# Patient Record
Sex: Female | Born: 1955 | Race: White | Hispanic: No | Marital: Married | Smoking: Never smoker
Health system: Southern US, Community
[De-identification: ages and names within clinical notes are randomized; demographics above are authoritative.]

## PROBLEM LIST (undated history)

## (undated) DIAGNOSIS — I6529 Occlusion and stenosis of unspecified carotid artery: Secondary | ICD-10-CM

## (undated) DIAGNOSIS — E039 Hypothyroidism, unspecified: Secondary | ICD-10-CM

## (undated) DIAGNOSIS — E559 Vitamin D deficiency, unspecified: Secondary | ICD-10-CM

## (undated) DIAGNOSIS — E079 Disorder of thyroid, unspecified: Secondary | ICD-10-CM

## (undated) DIAGNOSIS — D649 Anemia, unspecified: Secondary | ICD-10-CM

## (undated) DIAGNOSIS — K219 Gastro-esophageal reflux disease without esophagitis: Secondary | ICD-10-CM

## (undated) DIAGNOSIS — E230 Hypopituitarism: Secondary | ICD-10-CM

## (undated) DIAGNOSIS — K259 Gastric ulcer, unspecified as acute or chronic, without hemorrhage or perforation: Secondary | ICD-10-CM

## (undated) DIAGNOSIS — D352 Benign neoplasm of pituitary gland: Secondary | ICD-10-CM

## (undated) DIAGNOSIS — R569 Unspecified convulsions: Secondary | ICD-10-CM

## (undated) DIAGNOSIS — G40909 Epilepsy, unspecified, not intractable, without status epilepticus: Secondary | ICD-10-CM

## (undated) DIAGNOSIS — K449 Diaphragmatic hernia without obstruction or gangrene: Secondary | ICD-10-CM

## (undated) DIAGNOSIS — T7840XA Allergy, unspecified, initial encounter: Secondary | ICD-10-CM

## (undated) HISTORY — DX: Disorder of thyroid, unspecified: E07.9

## (undated) HISTORY — DX: Hypothyroidism, unspecified: E03.9

## (undated) HISTORY — DX: Hypopituitarism: E23.0

## (undated) HISTORY — DX: Diaphragmatic hernia without obstruction or gangrene: K44.9

## (undated) HISTORY — PX: OTHER SURGICAL HISTORY: SHX169

## (undated) HISTORY — DX: Epilepsy, unspecified, not intractable, without status epilepticus: G40.909

## (undated) HISTORY — DX: Unspecified convulsions: R56.9

## (undated) HISTORY — DX: Allergy, unspecified, initial encounter: T78.40XA

## (undated) HISTORY — DX: Occlusion and stenosis of unspecified carotid artery: I65.29

## (undated) HISTORY — DX: Vitamin D deficiency, unspecified: E55.9

## (undated) HISTORY — DX: Anemia, unspecified: D64.9

## (undated) HISTORY — DX: Gastric ulcer, unspecified as acute or chronic, without hemorrhage or perforation: K25.9

## (undated) HISTORY — PX: WISDOM TOOTH EXTRACTION: SHX21

## (undated) HISTORY — DX: Benign neoplasm of pituitary gland: D35.2

## (undated) HISTORY — DX: Gastro-esophageal reflux disease without esophagitis: K21.9

---

## 2007-10-07 DIAGNOSIS — E559 Vitamin D deficiency, unspecified: Secondary | ICD-10-CM | POA: Insufficient documentation

## 2008-05-19 DIAGNOSIS — E669 Obesity, unspecified: Secondary | ICD-10-CM | POA: Insufficient documentation

## 2009-01-01 DIAGNOSIS — K219 Gastro-esophageal reflux disease without esophagitis: Secondary | ICD-10-CM | POA: Insufficient documentation

## 2011-03-28 DIAGNOSIS — E230 Hypopituitarism: Secondary | ICD-10-CM | POA: Insufficient documentation

## 2011-03-28 DIAGNOSIS — E8881 Metabolic syndrome: Secondary | ICD-10-CM | POA: Insufficient documentation

## 2011-03-28 DIAGNOSIS — E786 Lipoprotein deficiency: Secondary | ICD-10-CM | POA: Insufficient documentation

## 2011-05-29 DIAGNOSIS — G40109 Localization-related (focal) (partial) symptomatic epilepsy and epileptic syndromes with simple partial seizures, not intractable, without status epilepticus: Secondary | ICD-10-CM | POA: Insufficient documentation

## 2011-07-09 DIAGNOSIS — R918 Other nonspecific abnormal finding of lung field: Secondary | ICD-10-CM | POA: Insufficient documentation

## 2011-07-09 DIAGNOSIS — R0600 Dyspnea, unspecified: Secondary | ICD-10-CM | POA: Insufficient documentation

## 2012-01-05 DIAGNOSIS — D352 Benign neoplasm of pituitary gland: Secondary | ICD-10-CM | POA: Insufficient documentation

## 2012-03-07 DIAGNOSIS — R948 Abnormal results of function studies of other organs and systems: Secondary | ICD-10-CM | POA: Insufficient documentation

## 2012-07-24 DIAGNOSIS — E063 Autoimmune thyroiditis: Secondary | ICD-10-CM | POA: Insufficient documentation

## 2012-07-24 DIAGNOSIS — E236 Other disorders of pituitary gland: Secondary | ICD-10-CM | POA: Insufficient documentation

## 2012-07-24 DIAGNOSIS — E785 Hyperlipidemia, unspecified: Secondary | ICD-10-CM | POA: Insufficient documentation

## 2012-10-15 DIAGNOSIS — R29818 Other symptoms and signs involving the nervous system: Secondary | ICD-10-CM | POA: Insufficient documentation

## 2012-12-17 DIAGNOSIS — E8881 Metabolic syndrome: Secondary | ICD-10-CM | POA: Insufficient documentation

## 2012-12-17 DIAGNOSIS — E039 Hypothyroidism, unspecified: Secondary | ICD-10-CM | POA: Insufficient documentation

## 2014-04-22 DIAGNOSIS — H60399 Other infective otitis externa, unspecified ear: Secondary | ICD-10-CM | POA: Insufficient documentation

## 2015-10-13 DIAGNOSIS — B370 Candidal stomatitis: Secondary | ICD-10-CM | POA: Insufficient documentation

## 2015-12-01 DIAGNOSIS — J342 Deviated nasal septum: Secondary | ICD-10-CM | POA: Insufficient documentation

## 2015-12-01 DIAGNOSIS — J019 Acute sinusitis, unspecified: Secondary | ICD-10-CM | POA: Insufficient documentation

## 2015-12-10 HISTORY — PX: COLONOSCOPY: SHX174

## 2016-04-27 DIAGNOSIS — E611 Iron deficiency: Secondary | ICD-10-CM | POA: Insufficient documentation

## 2016-07-31 DIAGNOSIS — D649 Anemia, unspecified: Secondary | ICD-10-CM | POA: Insufficient documentation

## 2016-07-31 DIAGNOSIS — Z1211 Encounter for screening for malignant neoplasm of colon: Secondary | ICD-10-CM | POA: Insufficient documentation

## 2016-11-22 DIAGNOSIS — Q388 Other congenital malformations of pharynx: Secondary | ICD-10-CM | POA: Insufficient documentation

## 2016-11-22 DIAGNOSIS — Z87730 Personal history of (corrected) cleft lip and palate: Secondary | ICD-10-CM | POA: Insufficient documentation

## 2017-07-31 DIAGNOSIS — D259 Leiomyoma of uterus, unspecified: Secondary | ICD-10-CM | POA: Insufficient documentation

## 2017-10-01 DIAGNOSIS — M810 Age-related osteoporosis without current pathological fracture: Secondary | ICD-10-CM | POA: Insufficient documentation

## 2017-10-01 DIAGNOSIS — R030 Elevated blood-pressure reading, without diagnosis of hypertension: Secondary | ICD-10-CM | POA: Insufficient documentation

## 2017-10-16 DIAGNOSIS — E538 Deficiency of other specified B group vitamins: Secondary | ICD-10-CM | POA: Insufficient documentation

## 2018-08-19 ENCOUNTER — Ambulatory Visit (INDEPENDENT_AMBULATORY_CARE_PROVIDER_SITE_OTHER): Payer: BLUE CROSS/BLUE SHIELD | Admitting: Family Medicine

## 2018-08-19 ENCOUNTER — Encounter: Payer: Self-pay | Admitting: Family Medicine

## 2018-08-19 VITALS — Wt 163.8 lb

## 2018-08-19 DIAGNOSIS — R102 Pelvic and perineal pain unspecified side: Secondary | ICD-10-CM

## 2018-08-19 DIAGNOSIS — N3090 Cystitis, unspecified without hematuria: Secondary | ICD-10-CM | POA: Diagnosis not present

## 2018-08-19 DIAGNOSIS — Z0000 Encounter for general adult medical examination without abnormal findings: Secondary | ICD-10-CM

## 2018-08-19 MED ORDER — NITROFURANTOIN MONOHYD MACRO 100 MG PO CAPS: 100.0000 mg | ORAL_CAPSULE | Freq: Two times a day (BID) | ORAL | 0 refills | Status: AC

## 2018-08-19 MED ORDER — FLUCONAZOLE 150 MG PO TABS: 150.0000 mg | ORAL_TABLET | Freq: Once | ORAL | 0 refills | Status: DC | PRN

## 2018-08-19 NOTE — Progress Notes (Signed)
Have had a lot of pelvic pressure and ? Bladder issues for a year or so. Urinates a lot but does not have dysuria. Lives in [place] so all providers are there. Currently taking OxybutyninCL ER 5mg  for bladder spasms but is not helping. Her OB/GYN in [place] ordered this. Denies vag d/c

## 2018-08-19 NOTE — Progress Notes (Signed)
   GYNECOLOGY OFFICE VISIT NOTE History:  62 y.o. G3P0 here today for urinary frequency, dysuria.   - placed on medication for bladder spasms prior to moving from [place] (oxybutynin)  - just got back from travelling, not drinking as much, lots of walking around  - no fevers, chills - no back pain  The following portions of the patient's history were reviewed and updated as appropriate: allergies, current medications, past family history, past medical history, past social history, past surgical history and problem list.   Review of Systems:  Pertinent items noted in HPI ROS  Objective:  Physical Exam Wt 74.3 kg  Physical Exam  Constitutional: She is oriented to person, place, and time. She appears well-developed and well-nourished. No distress.  HENT:  Head: Normocephalic and atraumatic.  Eyes: Conjunctivae and EOM are normal.  Genitourinary:  Genitourinary Comments: No CVA tednerness  Neurological: She is alert and oriented to person, place, and time.  Psychiatric: She has a normal mood and affect. Her behavior is normal.  Nursing note and vitals reviewed.  Labs and Imaging Results for orders placed or performed in visit on 08/19/18 (from the past 168 hour(s))  POCT urinalysis dip (device)   Collection Time: 08/19/18  6:13 PM  Result Value Ref Range   Glucose, UA NEGATIVE NEGATIVE mg/dL   Bilirubin Urine NEGATIVE NEGATIVE   Ketones, ur NEGATIVE NEGATIVE mg/dL   Specific Gravity, Urine 1.025 1.005 - 1.030   Hgb urine dipstick TRACE (A) NEGATIVE   pH 7.0 5.0 - 8.0   Protein, ur 100 (A) NEGATIVE mg/dL   Urobilinogen, UA 0.2 0.0 - 1.0 mg/dL   Nitrite POSITIVE (A) NEGATIVE   Leukocytes, UA SMALL (A) NEGATIVE  Urine Culture   Collection Time: 08/19/18  7:13 PM  Result Value Ref Range   Urine Culture, Routine Final report (A)    Organism ID, Bacteria Escherichia coli (A)    Antimicrobial Susceptibility Comment    No results found.  Assessment & Plan:   62yo female  here with pelvic pain, dysuria, frequency and urine dip consistent with UTI. Will treat with Macrobid, susceptible on resulted urine culture. Reviewed return precautions, no suspicion for pyelonephritis.   Lambert Mody. Juleen China, DO OB Family Medicine Fellow, [facility] for [employer], [place]

## 2018-08-20 LAB — POCT URINALYSIS DIP (DEVICE)
BILIRUBIN URINE: NEGATIVE
Glucose, UA: NEGATIVE mg/dL
Ketones, ur: NEGATIVE mg/dL
Nitrite: POSITIVE — AB
Protein, ur: 100 mg/dL — AB
SPECIFIC GRAVITY, URINE: 1.025 (ref 1.005–1.030)
UROBILINOGEN UA: 0.2 mg/dL (ref 0.0–1.0)
pH: 7 (ref 5.0–8.0)

## 2018-08-21 LAB — URINE CULTURE

## 2018-08-23 ENCOUNTER — Encounter: Payer: Self-pay | Admitting: General Practice

## 2018-08-23 ENCOUNTER — Encounter: Payer: Self-pay | Admitting: Family Medicine

## 2018-08-23 NOTE — Progress Notes (Signed)
Patient came by office requesting urine culture results. Informed patient of results & to continue macrobid. Patient asked about refill on Diflucan, states she took one yesterday and is still feeling a little something. Discussed with patient waiting more time for treatment as we generally do not refill before 3 days. Discussed she can also use OTC monistat over the weekend if needed/desired. Patient verbalized understanding & had no questions.

## 2018-10-17 DIAGNOSIS — J3089 Other allergic rhinitis: Secondary | ICD-10-CM | POA: Insufficient documentation

## 2018-11-29 DIAGNOSIS — N941 Unspecified dyspareunia: Secondary | ICD-10-CM | POA: Diagnosis not present

## 2018-11-29 DIAGNOSIS — R37 Sexual dysfunction, unspecified: Secondary | ICD-10-CM | POA: Diagnosis not present

## 2018-12-11 ENCOUNTER — Ambulatory Visit (INDEPENDENT_AMBULATORY_CARE_PROVIDER_SITE_OTHER): Payer: BC Managed Care – PPO | Admitting: Cardiovascular Disease

## 2018-12-11 ENCOUNTER — Encounter: Payer: Self-pay | Admitting: Cardiovascular Disease

## 2018-12-11 VITALS — BP 122/80 | HR 74 | Ht 64.0 in | Wt 166.0 lb

## 2018-12-11 DIAGNOSIS — R079 Chest pain, unspecified: Secondary | ICD-10-CM

## 2018-12-11 DIAGNOSIS — R0789 Other chest pain: Secondary | ICD-10-CM | POA: Insufficient documentation

## 2018-12-11 DIAGNOSIS — Z8249 Family history of ischemic heart disease and other diseases of the circulatory system: Secondary | ICD-10-CM | POA: Insufficient documentation

## 2018-12-11 MED ORDER — METOPROLOL TARTRATE 100 MG PO TABS: 100.0000 mg | ORAL_TABLET | Freq: Once | ORAL | 0 refills | Status: DC

## 2018-12-11 NOTE — Assessment & Plan Note (Signed)
Kim Kim is self-referred for evaluation of several episodes of effort angina.  She has a strong family history of heart disease as well as diabetes and hyperlipidemia.  She is had an extensive work-up in the past [place] with negative stress test and coronary CTA back in 2010 11, normal coronary calcium score 11/15/2017 and normal stress echo 05/11/2018.  She recently relocated to [facility] with her husband because of job.  She is had 3 episodes of effort angina while doing extensive exercise

## 2018-12-11 NOTE — Patient Instructions (Addendum)
Please arrive at the [facility] main entrance of [facility] at xx:xx AM (30-45 minutes prior to test start time)  [facility] [place], [place] [address] [phone]  Proceed to the [facility] Radiology Department (First Floor).  Please follow these instructions carefully (unless otherwise directed):   On the Night Before the Test: . Be sure to Drink plenty of water. . Do not consume any caffeinated/decaffeinated beverages or chocolate 12 hours prior to your test. . Do not take any antihistamines 12 hours prior to your test. . If you take Metformin do not take 24 hours prior to test. . If the patient has contrast allergy: ? Patient will need a prescription for Prednisone and very clear instructions (as follows): 1. Prednisone 50 mg - take 13 hours prior to test 2. Take another Prednisone 50 mg 7 hours prior to test 3. Take another Prednisone 50 mg 1 hour prior to test 4. Take Benadryl 50 mg 1 hour prior to test . Patient must complete all four doses of above prophylactic medications. . Patient will need a ride after test due to Benadryl.  On the Day of the Test: . Drink plenty of water. Do not drink any water within one hour of the test. . Do not eat any food 4 hours prior to the test. . You may take your regular medications prior to the test.  . Take metoprolol (Lopressor) 100 MG two hours prior to test. . HOLD Furosemide/Hydrochlorothiazide morning of the test.                  -IF HR is greater than 55 BPM and patient is less than or equal to 27 yrs old Lopressor 100mg  x1.       After the Test: . Drink plenty of water. . After receiving IV contrast, you may experience a mild flushed feeling. This is normal. . On occasion, you may experience a mild rash up to 24 hours after the test. This is not dangerous. If this occurs, you can take Benadryl 25 mg and increase your fluid intake. . If you experience trouble breathing, this can be  serious. If it is severe call 911 IMMEDIATELY. If it is mild, please call our office. . If you take any of these medications: Glipizide/Metformin, Avandament, Glucavance, please do not take 48 hours after completing test.  LABS: Your physician recommends that you return for lab work TODAY: CBC, BMP  FOLLOW UP: YOUR PHYSICIAN RECOMMENDS THAT YOU SCHEDULE A FOLLOW UP APPOINTMENT IN 3 MONTHS.

## 2018-12-11 NOTE — Progress Notes (Signed)
12/11/2018 Kim Kim   09/01/1956  [id number]  Primary Physician Patient, No Pcp Per Primary Cardiologist: Lorretta Harp MD Renae Gloss  HPI:  Kim Kim is a 63 y.o. fit appearing married Caucasian female mother of 3 children retired Geologist, engineering and is self-referred for evaluation of chest pain.  She is accompanied by her husband Cornelia Copa.  They both recently relocated from the [facility] area for professional reasons.  She has a history of hyperlipidemia and non-insulin-requiring diabetes as well as family history for heart disease.  Mother had stents and died at age 13.  Brother had bypass surgery.  She is had extensive work-up in the past including negative stress test and coronary CTA back in May 2010 time.  She had a 0 coronary calcium score 11/15/2017 and a normal stress echo 05/11/2018.  She works out frequently doing Pilates and aerobic exercise and has had 3 episodes of effort angina which is typical in nature.  She is had no rest pain.  She apparently has a coronary CTA scheduled up in [place] on March 2 and is here for evaluation.   Current Meds  Medication Sig  . Cholecalciferol 2000 units CAPS Take 1 capsule by mouth.  . fluticasone (FLONASE) 50 MCG/ACT nasal spray Place 1 spray into both nostrils as needed.   . Lactobacillus Acidophilus POWD by Does not apply route.  . lamoTRIgine (LAMICTAL) 25 MG tablet Take 25 mg by mouth daily.  Marland Kitchen levothyroxine (SYNTHROID) 50 MCG tablet Take 50 mcg by mouth daily before breakfast.  . loteprednol (LOTEMAX) 0.5 % ophthalmic suspension Place 1 drop into both eyes 4 (four) times daily.   . metFORMIN (GLUMETZA) 500 MG (MOD) 24 hr tablet Take 1,000 mg by mouth 2 (two) times daily with a meal.  . rosuvastatin (CRESTOR) 20 MG tablet Take 20 mg by mouth daily.  . [DISCONTINUED] azelastine (ASTELIN) 0.1 % nasal spray Place 1 spray into both nostrils 2 (two) times daily. Use in each nostril as directed  . [DISCONTINUED] oxybutynin  (DITROPAN-XL) 5 MG 24 hr tablet Take 5 mg by mouth at bedtime.     No Known Allergies  Social History   Socioeconomic History  . Marital status: Married    Spouse name: Not on file  . Number of children: Not on file  . Years of education: Not on file  . Highest education level: Not on file  Occupational History  . Not on file  Social Needs  . Financial resource strain: Not on file  . Food insecurity:    Worry: Not on file    Inability: Not on file  . Transportation needs:    Medical: Not on file    Non-medical: Not on file  Tobacco Use  . Smoking status: Never Smoker  . Smokeless tobacco: Never Used  Substance and Sexual Activity  . Alcohol use: Never    Frequency: Never  . Drug use: Never  . Sexual activity: Yes    Birth control/protection: Post-menopausal  Lifestyle  . Physical activity:    Days per week: Not on file    Minutes per session: Not on file  . Stress: Not on file  Relationships  . Social connections:    Talks on phone: Not on file    Gets together: Not on file    Attends religious service: Not on file    Active member of club or organization: Not on file    Attends meetings of clubs or organizations: Not  on file    Relationship status: Not on file  . Intimate partner violence:    Fear of current or ex partner: Not on file    Emotionally abused: Not on file    Physically abused: Not on file    Forced sexual activity: Not on file  Other Topics Concern  . Not on file  Social History Narrative  . Not on file     Review of Systems: General: negative for chills, fever, night sweats or weight changes.  Cardiovascular: negative for chest pain, dyspnea on exertion, edema, orthopnea, palpitations, paroxysmal nocturnal dyspnea or shortness of breath Dermatological: negative for rash Respiratory: negative for cough or wheezing Urologic: negative for hematuria Abdominal: negative for nausea, vomiting, diarrhea, bright red blood per rectum, melena, or  hematemesis Neurologic: negative for visual changes, syncope, or dizziness All other systems reviewed and are otherwise negative except as noted above.    Blood pressure 122/80, pulse 74, height 5\' 4"  (1.626 m), weight 166 lb (75.3 kg).  General appearance: alert and no distress Neck: no adenopathy, no carotid bruit, no JVD, supple, symmetrical, trachea midline and thyroid not enlarged, symmetric, no tenderness/mass/nodules Lungs: clear to auscultation bilaterally Heart: regular rate and rhythm, S1, S2 normal, no murmur, click, rub or gallop Extremities: extremities normal, atraumatic, no cyanosis or edema Pulses: 2+ and symmetric Skin: Skin color, texture, turgor normal. No rashes or lesions Neurologic: Alert and oriented X 3, normal strength and tone. Normal symmetric reflexes. Normal coordination and gait  EKG sinus rhythm at 74 without ST or T wave changes.  Personally reviewed this EKG.  ASSESSMENT AND PLAN:   Atypical chest pain [name] is self-referred for evaluation of several episodes of effort angina.  She has a strong family history of heart disease as well as diabetes and hyperlipidemia.  She is had an extensive work-up in the past [place] with negative stress test and coronary CTA back in 2010 11, normal coronary calcium score 11/15/2017 and normal stress echo 05/11/2018.  She recently relocated to [facility] with her husband because of job.  She is had 3 episodes of effort angina while doing extensive exercise  Hyperlipidemia History of hyperlipidemia with lipid profile performed 09/25/2018 revealing total cholesterol 197, LDL 113 and HDL of 57.  She was on Crestor 5 mg a day at that time and is since increased that over the last week to 20 mg a day.      Lorretta Harp MD FACP,FACC,FAHA, [facility] 12/11/2018 3:30 PM

## 2018-12-11 NOTE — Assessment & Plan Note (Signed)
History of hyperlipidemia with lipid profile performed 09/25/2018 revealing total cholesterol 197, LDL 113 and HDL of 57.  She was on Crestor 5 mg a day at that time and is since increased that over the last week to 20 mg a day.

## 2018-12-23 DIAGNOSIS — R079 Chest pain, unspecified: Secondary | ICD-10-CM | POA: Diagnosis not present

## 2018-12-23 DIAGNOSIS — E785 Hyperlipidemia, unspecified: Secondary | ICD-10-CM | POA: Diagnosis not present

## 2018-12-24 DIAGNOSIS — E785 Hyperlipidemia, unspecified: Secondary | ICD-10-CM | POA: Diagnosis not present

## 2018-12-24 DIAGNOSIS — E039 Hypothyroidism, unspecified: Secondary | ICD-10-CM | POA: Diagnosis not present

## 2018-12-31 DIAGNOSIS — D352 Benign neoplasm of pituitary gland: Secondary | ICD-10-CM | POA: Diagnosis not present

## 2018-12-31 DIAGNOSIS — E063 Autoimmune thyroiditis: Secondary | ICD-10-CM | POA: Diagnosis not present

## 2018-12-31 DIAGNOSIS — E538 Deficiency of other specified B group vitamins: Secondary | ICD-10-CM | POA: Diagnosis not present

## 2018-12-31 DIAGNOSIS — E230 Hypopituitarism: Secondary | ICD-10-CM | POA: Diagnosis not present

## 2018-12-31 DIAGNOSIS — E039 Hypothyroidism, unspecified: Secondary | ICD-10-CM | POA: Diagnosis not present

## 2019-01-02 DIAGNOSIS — M1712 Unilateral primary osteoarthritis, left knee: Secondary | ICD-10-CM | POA: Diagnosis not present

## 2019-01-02 DIAGNOSIS — M25562 Pain in left knee: Secondary | ICD-10-CM | POA: Diagnosis not present

## 2019-01-02 DIAGNOSIS — M1711 Unilateral primary osteoarthritis, right knee: Secondary | ICD-10-CM | POA: Diagnosis not present

## 2019-01-02 DIAGNOSIS — M25561 Pain in right knee: Secondary | ICD-10-CM | POA: Diagnosis not present

## 2019-01-30 ENCOUNTER — Telehealth: Payer: Self-pay | Admitting: Cardiovascular Disease

## 2019-01-30 ENCOUNTER — Ambulatory Visit: Payer: BLUE CROSS/BLUE SHIELD | Admitting: Sports Medicine

## 2019-01-30 NOTE — Telephone Encounter (Signed)
Pt c/o of Chest Pain: STAT if CP now or developed within 24 hours  1. Are you having CP right now? A little tightness, calming down some  2. Are you experiencing any other symptoms (ex. SOB, nausea, vomiting, sweating)? no  3. How long have you been experiencing CP?  today 4. Is your CP continuous or coming and going? Comes and goes  5. Have you taken Nitroglycerin?  She does not have any ?

## 2019-01-30 NOTE — Telephone Encounter (Signed)
Spoke with Kim Kim, she has been doing well and she went out for a walk today and developed chest tightness and a little SOB. She continues to have some chest tightness now. It is in the center of her chest and is a 5 on a scale of 1-10. She reports resting and lying down makes the discomfort better. She reports her cardiac CTA was normal and she is not sure what is going on. The discomfort does not change with movement or a deep breath. Will forward to dr berry to review and advise

## 2019-01-31 NOTE — Telephone Encounter (Signed)
Follow up   Patient is following up on Dr.Berry's recommendations. Please advise.

## 2019-01-31 NOTE — Telephone Encounter (Signed)
She has had a fairly recent coronary calcium score of 0 and negative coronary CTA in [place].  She is scheduled for coronary CTA here however this was postponed because of COVID-19.  I am less concerned given her prior negative work-up although certainly she has risk factors and family history.  Maybe we can try to push up her coronary CTA here

## 2019-01-31 NOTE — Telephone Encounter (Signed)
Returned call to patient.Dr.Berry's recommendation given.She stated she feels like something is wrong.She had a episode while walking yesterday felt like a elephant sitting on her chest.Stated she stopped walking went in house and sat down heaviness went away.Stated she has been having these episodes with activity.She has not had any at rest.Stated she gets very anxious.She has stopped doing anything.She just had a coronary CT done in [place] last month.She don't feel like she needs another one.Dr.Berry not in office today. Advised I will send message to Fabian Sharp PA for advice.

## 2019-01-31 NOTE — Telephone Encounter (Signed)
Triage notified me of an incoming call from Kim Kim describing chest pain. It sounds as though she has been having exertional chest pain intermittently for the past year. She has had two CT coronaries that were negative for obstructive disease (10/2017 and 12/2018 in [place]). I reviewed Dr. Kennon Holter recent office visit note. The patient is not interested in pursuing another CT coronary right now. I reviewed her options and advised that she did not need to go to the ER given her symptoms have not progressed over the past year. I will send a message to get her set up with a telehealth visit with Dr. [name] at his earliest convenience. She appreciated this as an evisit will calm her anxiety. She will await further instructions.   She is amenable to a telehealth visit, preferably video visit. I will send instructions to her MyChart account. She will download WebEx. She is agreeable to this plan. She understands that if her chest pain worsens she should call back.    Tami Lin Briyah Wheelwright, PA-C 01/31/2019, 4:13 PM [phone]

## 2019-02-01 NOTE — Telephone Encounter (Signed)
I would be happy to do a WebEx telehealth visit next week

## 2019-02-04 NOTE — Telephone Encounter (Signed)
F/U Message           Patient is calling back today to see why no one has called her back from last Thursday she states she  had another episode again today. Patient was told a doctor would call her back and as of today she has not recv'd a call yet, would like a call from the doctor ASAP.

## 2019-02-04 NOTE — Telephone Encounter (Signed)
I contact patient, advised that Dr.Berry was seeing virtual visit patients today and tomorrow- I offered her a spot for tomorrow, but patient would like to be seen today. I advised patient I would reach out to his nurse and she could call and let the patient know of available spots if any for today. Patient states she has had two episodes of her chest pain over the weekend, and would like for someone to call her regarding those issues.  I sent a secure chat to Dr.Berry's nurse to make her aware that patient would like a call back.  Will route this call as well.

## 2019-02-04 NOTE — Telephone Encounter (Signed)
New Message   Pt c/o of Chest Pain: STAT if CP now or developed within 24 hours  1. Are you having CP right now? NO  2. Are you experiencing any other symptoms (ex. SOB, nausea, vomiting, sweating)? NO  3. How long have you been experiencing CP? Patient states Dr. [name] knows about it.  4. Is your CP continuous or coming and going? Coming and going  5. Have you taken Nitroglycerin? NO ?

## 2019-02-05 ENCOUNTER — Telehealth (INDEPENDENT_AMBULATORY_CARE_PROVIDER_SITE_OTHER): Payer: BC Managed Care – PPO | Admitting: Cardiovascular Disease

## 2019-02-05 ENCOUNTER — Telehealth: Payer: Self-pay

## 2019-02-05 VITALS — Ht 64.0 in | Wt 160.0 lb

## 2019-02-05 DIAGNOSIS — E782 Mixed hyperlipidemia: Secondary | ICD-10-CM | POA: Diagnosis not present

## 2019-02-05 DIAGNOSIS — E88819 Insulin resistance, unspecified: Secondary | ICD-10-CM

## 2019-02-05 DIAGNOSIS — R0789 Other chest pain: Secondary | ICD-10-CM | POA: Diagnosis not present

## 2019-02-05 DIAGNOSIS — E039 Hypothyroidism, unspecified: Secondary | ICD-10-CM

## 2019-02-05 DIAGNOSIS — Z8249 Family history of ischemic heart disease and other diseases of the circulatory system: Secondary | ICD-10-CM | POA: Diagnosis not present

## 2019-02-05 DIAGNOSIS — Z0189 Encounter for other specified special examinations: Secondary | ICD-10-CM

## 2019-02-05 DIAGNOSIS — E8881 Metabolic syndrome: Secondary | ICD-10-CM

## 2019-02-05 NOTE — Telephone Encounter (Signed)
Pt still c/o Exertional CP; and describes feeling as 'elephant on chest'; lasts 10-15 min, no palps and last episode was 4/7. She states pain started about 1 year ago and multiple tests performed. She also states that during her last OV, Dr. [name] recommended coronary CTA and pt states she went to [facility] in [place] in February and had coronary CTA performed there. Pt will attempt to get results faxed over to [facility] office for appt scheduled for 11am on 4/8     Virtual Visit Pre-Appointment Phone Call  Steps For Call:  1. Confirm consent - "In the setting of the current Covid19 crisis, you are scheduled for a (phone or video) visit with your provider on (date) at (time).  Just as we do with many in-office visits, in order for you to participate in this visit, we must obtain consent.  If you'd like, I can send this to your mychart (if signed up) or email for you to review.  Otherwise, I can obtain your verbal consent now.  All virtual visits are billed to your insurance company just like a normal visit would be.  By agreeing to a virtual visit, we'd like you to understand that the technology does not allow for your provider to perform an examination, and thus may limit your provider's ability to fully assess your condition.  Finally, though the technology is pretty good, we cannot assure that it will always work on either your or our end, and in the setting of a video visit, we may have to convert it to a phone-only visit.  In either situation, we cannot ensure that we have a secure connection.  Are you willing to proceed?"  2. Give patient instructions for WebEx download to smartphone as below if video visit  3. Advise patient to be prepared with any vital sign or heart rhythm information, their current medicines, and a piece of paper and pen handy for any instructions they may receive the day of their visit  4. Inform patient they will receive a phone call 15 minutes prior to their  appointment time (may be from unknown caller ID) so they should be prepared to answer  5. Confirm that appointment type is correct in Epic appointment notes (video vs telephone)    TELEPHONE CALL NOTE  Mylei Brackeen has been deemed a candidate for a follow-up tele-health visit to limit community exposure during the Covid-19 pandemic. I spoke with the patient via phone to ensure availability of phone/video source, confirm preferred email & phone number, and discuss instructions and expectations.  I reminded [name] to be prepared with any vital sign and/or heart rhythm information that could potentially be obtained via home monitoring, at the time of her visit. I reminded Aneira Cavitt to expect a phone call at the time of her visit if her visit.  Did the patient verbally acknowledge consent to treatment? yes  Annita Brod, RN 02/05/2019    DOWNLOADING THE [place], go to App Store and type in WebEx in the search bar. [employer], the blue/green circle. The app is free but as with any other app downloads, their phone may require them to verify saved payment information or Apple password. The patient does NOT have to create an account.  - If Android, ask patient to go to [employer] and type in WebEx in the search bar. [employer], the blue/green circle. The app is free but  as with any other app downloads, their phone may require them to verify saved payment information or Android password. The patient does NOT have to create an account.   CONSENT FOR TELE-HEALTH VISIT - PLEASE REVIEW  I hereby voluntarily request, consent and authorize [facility] and its employed or contracted physicians, physician assistants, nurse practitioners or other licensed health care professionals (the Practitioner), to provide me with telemedicine health care services (the "Services") as deemed necessary by the treating Practitioner.  I acknowledge and consent to receive the Services by the Practitioner via telemedicine. I understand that the telemedicine visit will involve communicating with the Practitioner through live audiovisual communication technology and the disclosure of certain medical information by electronic transmission. I acknowledge that I have been given the opportunity to request an in-person assessment or other available alternative prior to the telemedicine visit and am voluntarily participating in the telemedicine visit.  I understand that I have the right to withhold or withdraw my consent to the use of telemedicine in the course of my care at any time, without affecting my right to future care or treatment, and that the Practitioner or I may terminate the telemedicine visit at any time. I understand that I have the right to inspect all information obtained and/or recorded in the course of the telemedicine visit and may receive copies of available information for a reasonable fee.  I understand that some of the potential risks of receiving the Services via telemedicine include:  Marland Kitchen Delay or interruption in medical evaluation due to technological equipment failure or disruption; . Information transmitted may not be sufficient (e.g. poor resolution of images) to allow for appropriate medical decision making by the Practitioner; and/or  . In rare instances, security protocols could fail, causing a breach of personal health information.  Furthermore, I acknowledge that it is my responsibility to provide information about my medical history, conditions and care that is complete and accurate to the best of my ability. I acknowledge that Practitioner's advice, recommendations, and/or decision may be based on factors not within their control, such as incomplete or inaccurate data provided by me or distortions of diagnostic images or specimens that may result from electronic transmissions. I understand that the practice of  medicine is not an exact science and that Practitioner makes no warranties or guarantees regarding treatment outcomes. I acknowledge that I will receive a copy of this consent concurrently upon execution via email to the email address I last provided but may also request a printed copy by calling the office of [place] [facility].    I understand that my insurance will be billed for this visit.   I have read or had this consent read to me. . I understand the contents of this consent, which adequately explains the benefits and risks of the Services being provided via telemedicine.  . I have been provided ample opportunity to ask questions regarding this consent and the Services and have had my questions answered to my satisfaction. . I give my informed consent for the services to be provided through the use of telemedicine in my medical care  By participating in this telemedicine visit I agree to the above.

## 2019-02-05 NOTE — Progress Notes (Signed)
Virtual Visit via Video Note   This visit type was conducted due to national recommendations for restrictions regarding the COVID-19 Pandemic (e.g. social distancing) in an effort to limit this patient's exposure and mitigate transmission in our community.  Due to her co-morbid illnesses, this patient is at least at moderate risk for complications without adequate follow up.  This format is felt to be most appropriate for this patient at this time.  All issues noted in this document were discussed and addressed.  A limited physical exam was performed with this format.  Please refer to the patient's chart for her consent to telehealth for [facility].   Evaluation Performed:  Follow-up visit  Date:  02/05/2019   ID:  Kim Kim, DOB 1955/12/11, MRN [id number]  Patient Location: Home  Provider Location: Home  PCP:  Patient, No Pcp Per  Cardiologist: Dr. Quay Burow Electrophysiologist:  None   Chief Complaint: Chest pain  History of Present Illness:    Kim Kim is a 63 y.o. female who presents via audio/video conferencing for a telehealth visit today.    Kim Kim is a 63y.o. fit appearing married Caucasian female mother of 3 children retired Geologist, engineering and is self-referred for evaluation of chest pain.  She was accompanied by her husband [place] Kim at the time of her last office visit 12/11/2018. They both recently relocated from the [facility] area for professional reasons.  She has a history of hyperlipidemia and non-insulin-requiring diabetes as well as family history for heart disease.  Mother had stents and died at age 77.  Brother had bypass surgery.  She is had extensive work-up in the past including negative stress test and coronary CTA back in May 2012.   She had a 0 coronary calcium score 11/15/2017 and a normal stress echo 05/11/2018.  She works out frequently doing Pilates and aerobic exercise and has had 3 episodes of effort angina which is typical in nature.  She is had no  rest pain.  She had a coronary CTA scheduled up in [place] on 12/23/2018 which I reviewed and which was entirely normal.  There was no CAD note noted.  She returned to [place] from [place] in early March.  She has had several episodes of effort angina once while walking up a hill and once while shopping at [facility].  It is unclear the etiology given her recent normal coronary CTA.  I have reassured her however that in all likelihood it is not ischemically mediated given her recent completely normal coronary CTA.  The patient does not have symptoms concerning for COVID-19 infection (fever, chills, cough, or new shortness of breath).    Past Medical History:  Diagnosis Date   Thyroid disease    Past Surgical History:  Procedure Laterality Date   none       Current Meds  Medication Sig   diclofenac sodium (VOLTAREN) 1 % GEL Apply topically.     Allergies:   Patient has no known allergies.   Social History   Tobacco Use   Smoking status: Never Smoker   Smokeless tobacco: Never Used  Substance Use Topics   Alcohol use: Never    Frequency: Never   Drug use: Never     Family Hx: The patient's family history includes Diabetes in her mother; Heart disease in her father and mother; Hypertension in her father; Thyroid disease in her mother.  ROS:   Please see the history of present illness.     All other systems  reviewed and are negative.   Prior CV studies:   The following studies were reviewed today:  Coronary CTA performed at [facility] 12/23/2018  Labs/Other Tests and Data Reviewed:    EKG:  No ECG reviewed.  Recent Labs: No results found for requested labs within last 8760 hours.   Recent Lipid Panel No results found for: CHOL, TRIG, HDL, CHOLHDL, LDLCALC, LDLDIRECT  Wt Readings from Last 3 Encounters:  02/05/19 160 lb (72.6 kg)  12/11/18 166 lb (75.3 kg)  08/19/18 163 lb 12.8 oz (74.3 kg)     Objective:    Vital Signs:  Ht 5\' 4"  (1.626 m)     Wt 160 lb (72.6 kg)    BMI 27.46 kg/m    A physical exam was not performed today since this was a telemedicine WebEx visit.  ASSESSMENT & PLAN:    1. Chest pain- [name] was initially seen because of chest pain that sounded effort related.  She does have risk factors including family history and hyperlipidemia.  She had a coronary CTA performed in [place] on 12/23/2018 when she was visiting that was entirely normal.  I personally reviewed the interpretation.  Since returning back to [facility] in early March she has had 2 episodes of chest pain, one while walking up a hill in her apartment development and one while shopping at [place].  Given her recent completely normal CTA is unlikely that this is ischemically mediated. 2. Hyperlipidemia- history of hyperlipidemia initially on Crestor 5 mg a day increase to 20 mg a day after her last lipid profile performed 09/25/2018 revealing total cholesterol 197, LDL of 113 and HDL of 57.  We will recheck a fasting lipid profile in several months to see if she is had any improvement on the increased Crestor dosing.  COVID-19 Education: The signs and symptoms of COVID-19 were discussed with the patient and how to seek care for testing (follow up with PCP or arrange E-visit).  The importance of social distancing was discussed today.  Time:   Today, I have spent 20 minutes with the patient with telehealth technology discussing the above problems.     Medication Adjustments/Labs and Tests Ordered: Current medicines are reviewed at length with the patient today.  Concerns regarding medicines are outlined above.  Tests Ordered: No orders of the defined types were placed in this encounter.  Medication Changes: No orders of the defined types were placed in this encounter.   Disposition:  Follow up in 4 month(s)  Signed, Quay Burow, MD  02/05/2019 11:31 AM    [place]

## 2019-02-05 NOTE — Telephone Encounter (Signed)
Patient and/or DPR-approved person aware of AVS instructions and verbalized understanding. 

## 2019-02-05 NOTE — Patient Instructions (Addendum)
Medication Instructions:  Your physician recommends that you continue on your current medications as directed. Please refer to the Current Medication list given to you today.  If you need a refill on your cardiac medications before your next appointment, please call your pharmacy.   Lab work: Your physician recommends that you return for lab work within 2-3 weeks: [place], [place], HEMOGLOBIN A1C, LIPID PANEL, THYROID FUNCTION PANEL  If you have labs (blood work) drawn today and your tests are completely normal, you will receive your results only by: Marland Kitchen MyChart Message (if you have MyChart) OR . A paper copy in the mail If you have any lab test that is abnormal or we need to change your treatment, we will call you to review the results.  Testing/Procedures: NONE  Follow-Up: At [facility], you and your health needs are our priority.  As part of our continuing mission to provide you with exceptional heart care, we have created designated Provider Care Teams.  These Care Teams include your primary Cardiologist (physician) and Advanced Practice Providers (APPs -  Physician Assistants and Nurse Practitioners) who all work together to provide you with the care you need, when you need it. You will need a follow up appointment in 3-4 months with Dr. [name].  Please call our office 2 months in advance to schedule this appointment.

## 2019-02-08 DIAGNOSIS — R079 Chest pain, unspecified: Secondary | ICD-10-CM | POA: Diagnosis not present

## 2019-02-08 DIAGNOSIS — Z6828 Body mass index (BMI) 28.0-28.9, adult: Secondary | ICD-10-CM | POA: Diagnosis not present

## 2019-02-08 DIAGNOSIS — R918 Other nonspecific abnormal finding of lung field: Secondary | ICD-10-CM | POA: Diagnosis not present

## 2019-02-08 DIAGNOSIS — K219 Gastro-esophageal reflux disease without esophagitis: Secondary | ICD-10-CM | POA: Diagnosis not present

## 2019-02-10 ENCOUNTER — Telehealth: Payer: Self-pay

## 2019-02-10 ENCOUNTER — Other Ambulatory Visit: Payer: Self-pay

## 2019-02-10 DIAGNOSIS — I272 Pulmonary hypertension, unspecified: Secondary | ICD-10-CM

## 2019-02-10 NOTE — Progress Notes (Signed)
Received Secure chat message from Dr. [name] requesting that pt be scheduled for echo this week. Will route to scheduling.

## 2019-02-10 NOTE — Telephone Encounter (Signed)
I have no problem doing a 2D echo on her although I am not sure this will provide any useful information.  She is clearly symptomatic with something and has a normal coronary CTA.  I do not think she will be happy with anything short of a heart catheterization.  While I think that her CTA suggest that she has clean coronary arteries and clinically I do not think she needs a heart cath I am happy to do that if she thinks that we will give her peace of mind.

## 2019-02-10 NOTE — Telephone Encounter (Signed)
Spoke with pt who state she is still having episodes of chest pressure. She report she went for a walk today when symptoms occurred and since has not been relieved.Pt advised to report to ED for further evaluation but declined as she is worried about COVID-19 and state she doesn't feel like she's having a heart attack.  Pt state she spoke to her Cardiologist in [place] who sent an order for an ECHO to rule out pulmonary HTN.  Pt requesting if ECHO can be preformed today. Advised pt that in order for me to place an order in our system, I would have to consult with Dr. [name]. Pt began to cry and demand to speak with MD. Almyra Deforest, PA notified, and reviewed pt's chart. PA recommended that if pt's symptoms has worsened, she need to report to ED. Pt updated with recommendation and again declined. Will route message to MD for any further recommendations.

## 2019-02-10 NOTE — Telephone Encounter (Signed)
Received Secure chat message from Dr. [name] requesting that pt be scheduled for echo this week. Will route to scheduling.

## 2019-02-11 ENCOUNTER — Telehealth: Payer: Self-pay | Admitting: Cardiovascular Disease

## 2019-02-11 NOTE — Telephone Encounter (Signed)
Pt needs echo this week per Dr [name] dx chest pain and SOB .Adonis Housekeeper

## 2019-02-11 NOTE — Telephone Encounter (Signed)
Follow up:   Patient states that a nurse was going to call her back concering a ECHO. Please call patient.

## 2019-02-12 ENCOUNTER — Other Ambulatory Visit: Payer: Self-pay

## 2019-02-12 ENCOUNTER — Ambulatory Visit (HOSPITAL_COMMUNITY): Payer: BC Managed Care – PPO | Attending: Cardiology

## 2019-02-12 DIAGNOSIS — I272 Pulmonary hypertension, unspecified: Secondary | ICD-10-CM | POA: Diagnosis not present

## 2019-02-13 DIAGNOSIS — Z8719 Personal history of other diseases of the digestive system: Secondary | ICD-10-CM | POA: Diagnosis not present

## 2019-02-13 DIAGNOSIS — Z6828 Body mass index (BMI) 28.0-28.9, adult: Secondary | ICD-10-CM | POA: Diagnosis not present

## 2019-02-13 DIAGNOSIS — R0789 Other chest pain: Secondary | ICD-10-CM | POA: Diagnosis not present

## 2019-02-13 NOTE — Telephone Encounter (Signed)
Echo was done yesterday 02/12/19 ./ct

## 2019-02-17 ENCOUNTER — Telehealth: Payer: Self-pay | Admitting: Cardiovascular Disease

## 2019-02-17 NOTE — Telephone Encounter (Signed)
Echo results released to mychart; results faxed to fax number provided

## 2019-02-17 NOTE — Telephone Encounter (Signed)
Pt had telemedicine visit with Dr. [name] on 4/8

## 2019-02-17 NOTE — Telephone Encounter (Signed)
Per pt call following up on her US/ECHO results done on 02/12/19 please place in MyChart or give her a call.  Pt requested this office fax results to:   Attn: Dr Billey Gosling @ [facility].   phone [phone]   Fax # [phone]

## 2019-02-24 DIAGNOSIS — R0789 Other chest pain: Secondary | ICD-10-CM | POA: Diagnosis not present

## 2019-02-24 DIAGNOSIS — E039 Hypothyroidism, unspecified: Secondary | ICD-10-CM | POA: Diagnosis not present

## 2019-02-24 DIAGNOSIS — K219 Gastro-esophageal reflux disease without esophagitis: Secondary | ICD-10-CM | POA: Diagnosis not present

## 2019-02-24 DIAGNOSIS — J3089 Other allergic rhinitis: Secondary | ICD-10-CM | POA: Diagnosis not present

## 2019-03-13 ENCOUNTER — Encounter: Payer: Self-pay | Admitting: Family Medicine

## 2019-03-13 ENCOUNTER — Ambulatory Visit (INDEPENDENT_AMBULATORY_CARE_PROVIDER_SITE_OTHER): Payer: BC Managed Care – PPO | Admitting: Family Medicine

## 2019-03-13 VITALS — Ht 64.0 in | Wt 163.0 lb

## 2019-03-13 DIAGNOSIS — K2100 Gastro-esophageal reflux disease with esophagitis, without bleeding: Secondary | ICD-10-CM

## 2019-03-13 DIAGNOSIS — G40109 Localization-related (focal) (partial) symptomatic epilepsy and epileptic syndromes with simple partial seizures, not intractable, without status epilepticus: Secondary | ICD-10-CM | POA: Diagnosis not present

## 2019-03-13 DIAGNOSIS — E038 Other specified hypothyroidism: Secondary | ICD-10-CM

## 2019-03-13 DIAGNOSIS — E063 Autoimmune thyroiditis: Secondary | ICD-10-CM

## 2019-03-13 DIAGNOSIS — R079 Chest pain, unspecified: Secondary | ICD-10-CM | POA: Diagnosis not present

## 2019-03-13 DIAGNOSIS — Z7689 Persons encountering health services in other specified circumstances: Secondary | ICD-10-CM

## 2019-03-13 NOTE — Patient Instructions (Addendum)
Referral placed to [facility] neurology group as we discussed during office visit. Some of the providers in that group are Dr. Wells Guiles Tat, Dr. Narda Amber, Dr. Metta Clines, Dr. Ellouise Newer

## 2019-03-13 NOTE — Progress Notes (Signed)
Virtual Visit via Video Note  I connected with Kim Kim on 03/13/19 at  1:30 PM EDT by a video enabled telemedicine application and verified that I am speaking with the correct person using two identifiers. Location patient: home Location provider: work Persons participating in the virtual visit: patient, provider  I discussed the limitations of evaluation and management by telemedicine and the availability of in person appointments. The patient expressed understanding and agreed to proceed.  Chief Complaint  Patient presents with  . Establish Care    est care/ Covid concerns/ fatigue/ SOB/lump in throat/tylenol-- no fever     HPI: Kim Kim is a 63 y.o. female to establish care with our office. She and her husband recently moved from [place]. 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  She complains of ongoing episodes of chest pain for the past 10-11 months. These episodes were happening with exercise (but not every time she exercises) and she describes symptoms as "chest heaviness". She notes SOB during some of these episodes.  No SOB at rest.  She denies fever, chills, myalgias, sore throat, GI symptoms including vomiting and diarrhea, cough, loss of smell or taste.  Pt does endorse post nasal drip and feeling like there is mucous "sitting" in her throat. She has flonase but does not use.   Pt has had extensive cardiac workup for chest pain. Most recently, she had a telemedicine appt with cardio Dr. [name] [facility]) in 01/2019 and had an echo on 02/12/19 and it was normal. She had a negative stress test and coronary CTA in 02/2011. She had a 0 coronary calcium score 11/15/2017 and a normal stress echo 05/11/2018. She had a coronary CTA in [place] on 12/23/2018 which was normal.   Pt also had a telemedicine appt on 02/13/19 with GI Dr. Roney [place] (in [place] where pt moved from). At this time, a 3-4 week trial of daily PPI (protonix 40mg  daily) was  recommended (with pepcid PRN) with f/u if no/minimal improvement after that time. Consideration, at that time, would be made for EGD, chest imaging. Pt had actually been started on the protonix 40mg  qAM by her former PCP during a telemedicine visit on 02/08/19. Last EGD 2012 - mild esophagitis Last colonoscopy 2017 - normal  She has a h/o Hashimoto's thyroiditis and is on levothyroxine 76mcg daily. She has a h/o temporal lobe seizures and takes lamictal 25mg  daily. She requests referral to neurology to establish care. She has a h/o hypercholesterolemia and takes crestor 20mg  daily. She does not need any med refills at this time.  Past Medical History:  Diagnosis Date  . Seizures ([place])   . Thyroid disease     Past Surgical History:  Procedure Laterality Date  . none      Family History  Problem Relation Age of Onset  . Heart disease Mother   . Diabetes Mother   . Thyroid disease Mother   . Heart disease Father   . Hypertension Father     Social History   Tobacco Use  . Smoking status: Never Smoker  . Smokeless tobacco: Never Used  Substance Use Topics  . Alcohol use: Never    Frequency: Never  . Drug use: Never     Current Outpatient Medications:  .  diclofenac sodium (VOLTAREN) 1 % GEL, Apply topically., Disp: , Rfl:  .  fluticasone (FLONASE) 50 MCG/ACT nasal spray, Place 1 spray into both nostrils as needed. , Disp: , Rfl:  .  lamoTRIgine (LAMICTAL) 25 MG tablet, Take 25 mg by mouth daily., Disp: , Rfl:  .  levothyroxine (SYNTHROID) 50 MCG tablet, Take 50 mcg by mouth daily before breakfast., Disp: , Rfl:  .  loteprednol (LOTEMAX) 0.5 % ophthalmic suspension, Place 1 drop into both eyes 4 (four) times daily. , Disp: , Rfl:  .  rosuvastatin (CRESTOR) 20 MG tablet, Take 20 mg by mouth daily., Disp: , Rfl:  .  Lactobacillus Acidophilus POWD, by Does not apply route., Disp: , Rfl:   No Known Allergies    ROS: See pertinent positives and negatives per  HPI.   EXAM:  VITALS per patient if applicable: Ht 5\' 4"  (1.626 m)   Wt 163 lb (73.9 kg) Comment: pt reported  BMI 27.98 kg/m    GENERAL: alert, oriented, appears well and in no acute distress   NECK: normal movements of the head and neck  LUNGS: on inspection no signs of respiratory distress, breathing rate appears normal, no obvious gross SOB, gasping or wheezing, no conversational dyspnea  CV: no obvious cyanosis  MS: moves all visible extremities without noticeable abnormality  PSYCH/NEURO: pleasant and cooperative, speech and thought processing grossly intact   ASSESSMENT AND PLAN: 1. 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 Chest pain, unspecified type - occurs with exercise/exertion but not every time pt exercises, occasionally there is associated SOB - symptoms x 58mo - pt has had full cardiac work-up - all normal/negative  - pt is taking protonix 40mg  daily x 4 weeks to see if symptoms could have GI etiology - could consider pulmonary work-up/PFT's, may also have an anxiety component to symptoms vs MSK etiology vs some seasonal allergy component - pt does not believe symptoms are related to anxiety. She is agreeable to restarting her flonase 2 sprays each nostril daily and will take daily claritin or zyrtec. Could consider adding singulair as well  4. Gastroesophageal reflux disease with esophagitis - pt currently taking protonix 40mg  daily x 4 wks, takes pepcid PRN but rarely - had EGD in 2012 showing mild esophagitis  - could consider referral to establish care with GI as pt may need additional f/u and/or EGD  5. Hypothyroidism due to Hashimoto's thyroiditis - TFTs done 2/20 and WNL - cont levothyroxine 44mcg daily    I discussed the assessment and treatment plan with the patient. The patient was provided an  opportunity to ask questions and all were answered. The patient agreed with the plan and demonstrated an understanding of the instructions.   The patient was advised to call back or seek an in-person evaluation if the symptoms worsen or if the condition fails to improve as anticipated.   Letta Median, DO

## 2019-03-14 ENCOUNTER — Telehealth: Payer: Self-pay | Admitting: Family Medicine

## 2019-03-14 NOTE — Telephone Encounter (Signed)
It is my understanding that [facility] outpatient testing sites, at this time, are only for patients who meet certain criteria: - pts in long term care facilities with symptoms - pts 61yo and up who have symptoms - patients with underlying conditions (CHF, CAD, pulmonary HTN, morbid obesity, cancer pts, HIV pts, pts with immune deficiencies, etc) with symptoms - first responders with symptoms - asymptomatic pts from high risk environments (nursing homes, correctional facilities, etc)  This patient does not meet any of these criteria and her symptoms, if due to COVID-19, are mild. Her episodic SOB and chest pressure is not new and has been worked up over the past few months. Her fatigue, PND, and feeling of throat irritation/inflammation could be due to allergies or viral illness other than COVID-19. She is afebrile and denies chills, myalgias, sore throat, cough, GI symptoms. I had recommended she restart her flonase 2 sprays each nostril daily, start taking claritin or zyrtec 1 tab daily. I am not able to order testing on this patient

## 2019-03-14 NOTE — Telephone Encounter (Signed)
This encounter was created in error - please disregard.

## 2019-03-14 NOTE — Telephone Encounter (Signed)
Patient called and says she established with Dr. Bryan Lemma on yesterday with a virtual visit, they talked about COVID-19 testing and she says she would like to go ahead and be tested. I advised I will send her request to the office and someone will call back to let her know Dr. Vivia Ewing recommendation, she verbalized understanding. She asked about the testing site, I explained the location and a drive up test only with an order from the PCP.

## 2019-03-14 NOTE — Telephone Encounter (Signed)
Pt aware of notes below. Pt will keep [place] updated on symptoms if she becomes worse. Pt does not meet criteria to be tested at this time.

## 2019-04-10 DIAGNOSIS — R102 Pelvic and perineal pain: Secondary | ICD-10-CM | POA: Diagnosis not present

## 2019-04-10 DIAGNOSIS — L293 Anogenital pruritus, unspecified: Secondary | ICD-10-CM | POA: Diagnosis not present

## 2019-04-10 DIAGNOSIS — N952 Postmenopausal atrophic vaginitis: Secondary | ICD-10-CM | POA: Diagnosis not present

## 2019-05-07 DIAGNOSIS — D2272 Melanocytic nevi of left lower limb, including hip: Secondary | ICD-10-CM | POA: Diagnosis not present

## 2019-05-07 DIAGNOSIS — L814 Other melanin hyperpigmentation: Secondary | ICD-10-CM | POA: Diagnosis not present

## 2019-05-07 DIAGNOSIS — L578 Other skin changes due to chronic exposure to nonionizing radiation: Secondary | ICD-10-CM | POA: Diagnosis not present

## 2019-05-07 DIAGNOSIS — L821 Other seborrheic keratosis: Secondary | ICD-10-CM | POA: Diagnosis not present

## 2019-05-13 DIAGNOSIS — Z1231 Encounter for screening mammogram for malignant neoplasm of breast: Secondary | ICD-10-CM | POA: Diagnosis not present

## 2019-06-02 DIAGNOSIS — E063 Autoimmune thyroiditis: Secondary | ICD-10-CM | POA: Diagnosis not present

## 2019-06-02 DIAGNOSIS — E8881 Metabolic syndrome: Secondary | ICD-10-CM | POA: Diagnosis not present

## 2019-06-02 DIAGNOSIS — E559 Vitamin D deficiency, unspecified: Secondary | ICD-10-CM | POA: Diagnosis not present

## 2019-06-02 DIAGNOSIS — M810 Age-related osteoporosis without current pathological fracture: Secondary | ICD-10-CM | POA: Diagnosis not present

## 2019-06-02 DIAGNOSIS — E230 Hypopituitarism: Secondary | ICD-10-CM | POA: Diagnosis not present

## 2019-06-02 DIAGNOSIS — D352 Benign neoplasm of pituitary gland: Secondary | ICD-10-CM | POA: Diagnosis not present

## 2019-06-02 DIAGNOSIS — E039 Hypothyroidism, unspecified: Secondary | ICD-10-CM | POA: Diagnosis not present

## 2019-06-03 DIAGNOSIS — R0789 Other chest pain: Secondary | ICD-10-CM | POA: Diagnosis not present

## 2019-06-03 DIAGNOSIS — R079 Chest pain, unspecified: Secondary | ICD-10-CM | POA: Diagnosis not present

## 2019-06-03 DIAGNOSIS — K2970 Gastritis, unspecified, without bleeding: Secondary | ICD-10-CM | POA: Diagnosis not present

## 2019-06-03 DIAGNOSIS — K209 Esophagitis, unspecified: Secondary | ICD-10-CM | POA: Diagnosis not present

## 2019-06-03 DIAGNOSIS — K3189 Other diseases of stomach and duodenum: Secondary | ICD-10-CM | POA: Diagnosis not present

## 2019-06-03 DIAGNOSIS — K319 Disease of stomach and duodenum, unspecified: Secondary | ICD-10-CM | POA: Diagnosis not present

## 2019-06-03 DIAGNOSIS — K259 Gastric ulcer, unspecified as acute or chronic, without hemorrhage or perforation: Secondary | ICD-10-CM | POA: Diagnosis not present

## 2019-06-03 DIAGNOSIS — K2950 Unspecified chronic gastritis without bleeding: Secondary | ICD-10-CM | POA: Diagnosis not present

## 2019-06-03 DIAGNOSIS — K2270 Barrett's esophagus without dysplasia: Secondary | ICD-10-CM | POA: Diagnosis not present

## 2019-06-03 DIAGNOSIS — K449 Diaphragmatic hernia without obstruction or gangrene: Secondary | ICD-10-CM | POA: Diagnosis not present

## 2019-06-03 DIAGNOSIS — K2960 Other gastritis without bleeding: Secondary | ICD-10-CM | POA: Diagnosis not present

## 2019-06-03 DIAGNOSIS — K228 Other specified diseases of esophagus: Secondary | ICD-10-CM | POA: Diagnosis not present

## 2019-06-03 HISTORY — PX: ESOPHAGOGASTRODUODENOSCOPY: SHX1529

## 2019-06-12 DIAGNOSIS — M1711 Unilateral primary osteoarthritis, right knee: Secondary | ICD-10-CM | POA: Diagnosis not present

## 2019-06-12 DIAGNOSIS — M25561 Pain in right knee: Secondary | ICD-10-CM | POA: Diagnosis not present

## 2019-06-12 DIAGNOSIS — M25461 Effusion, right knee: Secondary | ICD-10-CM | POA: Diagnosis not present

## 2019-06-13 DIAGNOSIS — R0789 Other chest pain: Secondary | ICD-10-CM | POA: Diagnosis not present

## 2019-06-13 DIAGNOSIS — K449 Diaphragmatic hernia without obstruction or gangrene: Secondary | ICD-10-CM | POA: Diagnosis not present

## 2019-06-13 DIAGNOSIS — K259 Gastric ulcer, unspecified as acute or chronic, without hemorrhage or perforation: Secondary | ICD-10-CM | POA: Insufficient documentation

## 2019-06-13 DIAGNOSIS — K219 Gastro-esophageal reflux disease without esophagitis: Secondary | ICD-10-CM | POA: Diagnosis not present

## 2019-06-13 DIAGNOSIS — K253 Acute gastric ulcer without hemorrhage or perforation: Secondary | ICD-10-CM | POA: Diagnosis not present

## 2019-06-17 DIAGNOSIS — M1711 Unilateral primary osteoarthritis, right knee: Secondary | ICD-10-CM | POA: Diagnosis not present

## 2019-06-17 DIAGNOSIS — M25561 Pain in right knee: Secondary | ICD-10-CM | POA: Diagnosis not present

## 2019-06-17 DIAGNOSIS — M25461 Effusion, right knee: Secondary | ICD-10-CM | POA: Diagnosis not present

## 2019-06-18 DIAGNOSIS — M25561 Pain in right knee: Secondary | ICD-10-CM | POA: Diagnosis not present

## 2019-06-18 DIAGNOSIS — M25461 Effusion, right knee: Secondary | ICD-10-CM | POA: Diagnosis not present

## 2019-06-18 DIAGNOSIS — M1711 Unilateral primary osteoarthritis, right knee: Secondary | ICD-10-CM | POA: Diagnosis not present

## 2019-06-19 DIAGNOSIS — Z20828 Contact with and (suspected) exposure to other viral communicable diseases: Secondary | ICD-10-CM | POA: Diagnosis not present

## 2019-06-26 DIAGNOSIS — E538 Deficiency of other specified B group vitamins: Secondary | ICD-10-CM | POA: Diagnosis not present

## 2019-07-17 ENCOUNTER — Other Ambulatory Visit: Payer: Self-pay

## 2019-07-17 ENCOUNTER — Ambulatory Visit: Payer: Self-pay

## 2019-07-17 ENCOUNTER — Ambulatory Visit (INDEPENDENT_AMBULATORY_CARE_PROVIDER_SITE_OTHER): Payer: BC Managed Care – PPO | Admitting: Sports Medicine

## 2019-07-17 VITALS — BP 112/72 | Ht 64.0 in | Wt 162.0 lb

## 2019-07-17 DIAGNOSIS — M25561 Pain in right knee: Secondary | ICD-10-CM

## 2019-07-17 DIAGNOSIS — M170 Bilateral primary osteoarthritis of knee: Secondary | ICD-10-CM | POA: Diagnosis not present

## 2019-07-17 DIAGNOSIS — M1990 Unspecified osteoarthritis, unspecified site: Secondary | ICD-10-CM | POA: Insufficient documentation

## 2019-07-17 NOTE — Assessment & Plan Note (Addendum)
Osteoarthritis exacerbation right   Exam and ultrasound seems to suggest that patellofemoral compartment is the worst section -Continue to do activities as pain will tolerate especially stationary bike -Continue compression sleeve for swelling -Take intermittent anti-inflammatories as needed for pain -If not improved with conservative measures return for follow-up.   Note we instructed her in a HEP with some emphasis on isometrics  Reserve CSI for more severe pain  Note Standing knee films reviewed and reveal mild loss medially and more significant change in PF joint  She will RTC in 2 to 3 mos

## 2019-07-17 NOTE — Progress Notes (Signed)
PCP: Ronnald Nian, DO  Subjective:   CC: Patient is a 63 y.o. female here for knee pain and swelling.  HPI: [place] is a 63 year old female who presents today for approximately 1 month of right knee swelling and pain.  Patient states that she was otherwise in good health and exercising regularly prior to injury.  Her activity consisted of walking 3 miles per day.  She denies any injury or upset to the knee when symptoms began.  After 1 of her walks a month ago her knee swelled up and was not improved the next day.  She was seen by her PCP who recommended physical therapy and compression.  Patient went to two sessions of physical therapy but did not continue the exercises on her own and did not see any benefit from those treatments.  She had improvement of the swelling over time and with the compression brace and ice.  Patient was able to continue with her walking despite the swelling and pain.  Patient denies any locking, clicking of her knee or any instability in her gait.  Denies any loss of bowel or bladder control.  Denies problems with other joints.  Referred by Dr. Marlou Porch in [facility]  ROS: See HPI above.  I have personally reviewed pertinent past medical history, surgical, family, and social history as appropriate.     Objective:  BP 112/72   Ht 5\' 4"  (1.626 m)   Wt 162 lb (73.5 kg)   BMI 27.81 kg/m   Physical Exam: Gen: NAD, comfortable in exam room Knee: Observation: Right knee minimally more edematous proximal to kneecap knee than on left.  Negative for ecchymosis.  Spider angioma present on anterior and superior right knee. Mild valgus deformity bilaterally. Palpation: Right knee has mild fluid collection in superior knee.  No edema palpated on left knee.  Negative for tenderness to palpation along medial or lateral joint line.  Negative for Baker's cyst.  Negative for increased temperature to the joint. ROM: Patient has full extension and bilateral knees.  Flexion  of right knee mildly decreased secondary to pain. Strength: Patient has good strength with resisted flexion extension at knee.  Also has good strength with abduction and abduction of the hip. Mildly decreased tone of right quadriceps. Sensation: Normal bilaterally Special tests: Patellar grind test on right positive, normal on left.  Ultrasound RT Knee:  mild swelling in suprapatellar pouch - small effusion  with mild to moderate in mid trochlear groove and loss of space in lateral trochlear groove behind patella.   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 Ouida Sills and [place]. Fields, MD    Assessment & Plan:  Osteoarthritis Osteoarthritis exacerbation right worse than left.  Exam and ultrasound seems to suggest that patellofemoral compartment is the worst section -Continue to do activities as pain will tolerate especially stationary bike -Continue compression sleeve for swelling -Take intermittent anti-inflammatories as needed for pain -If not improved with conservative measures return for follow-up.  At that time can consider MRI and/or steroid injection  I independently examined pertinent imaging in relation to cc.  [place], [facility] PGY-2  I observed and examined the patient with the resident and agree with assessment and plan.  Note reviewed and modified by  me. Ila Mcgill, MD

## 2019-07-17 NOTE — Patient Instructions (Signed)
It was great to meet you!  We gave you some home exercises to start doing daily. We have also placed a referral for Physical Therapy. They will call you to schedule your first appt. You will work with Sharyn Blitz.  You were also given a compression sleeve today to wear during the day when you are active and exercising.  We will see you back at the end of October to see how you are doing.  If you have any questions in the meantime please give [place] a call!

## 2019-07-18 ENCOUNTER — Encounter

## 2019-07-18 DIAGNOSIS — G40109 Localization-related (focal) (partial) symptomatic epilepsy and epileptic syndromes with simple partial seizures, not intractable, without status epilepticus: Secondary | ICD-10-CM | POA: Diagnosis not present

## 2019-07-22 ENCOUNTER — Ambulatory Visit: Payer: BC Managed Care – PPO | Admitting: Sports Medicine

## 2019-08-01 ENCOUNTER — Encounter: Payer: Self-pay | Admitting: Physical Therapy

## 2019-08-01 ENCOUNTER — Other Ambulatory Visit: Payer: Self-pay

## 2019-08-01 ENCOUNTER — Ambulatory Visit: Payer: BC Managed Care – PPO | Attending: Family Medicine | Admitting: Physical Therapy

## 2019-08-01 DIAGNOSIS — M6281 Muscle weakness (generalized): Secondary | ICD-10-CM | POA: Insufficient documentation

## 2019-08-01 DIAGNOSIS — M25561 Pain in right knee: Secondary | ICD-10-CM | POA: Insufficient documentation

## 2019-08-01 DIAGNOSIS — M25661 Stiffness of right knee, not elsewhere classified: Secondary | ICD-10-CM | POA: Diagnosis not present

## 2019-08-01 NOTE — Therapy (Signed)
[place], [place], [address] Phone: [phone]   Fax:  [phone]  Physical Therapy Evaluation  Patient Details  Name: Kim Kim MRN: [id number] Date of Birth: 13-Oct-1956 Referring Provider (PT): Stefanie Libel MD   Encounter Date: 08/01/2019  PT End of Session - 08/01/19 0759    Visit Number  1    Number of Visits  13    Date for PT Re-Evaluation  09/12/19    Authorization Type  BCBS    PT Start Time  0758    PT Stop Time  0844    PT Time Calculation (min)  46 min    Activity Tolerance  Patient tolerated treatment well    Behavior During Therapy  [facility] for tasks assessed/performed       Past Medical History:  Diagnosis Date  . Seizures ([place])   . Thyroid disease     Past Surgical History:  Procedure Laterality Date  . none      There were no vitals filed for this visit.   Subjective Assessment - 08/01/19 0805    Subjective  I began having pain in RT knee about 7 weeks ago. I try to walk 3 mile a day for walking since COVID but my knee began blowing up. I did ice and 3 sessions in [place] over the summer where we stayed.My R knee was so swollen    Pertinent History  hashimoto dz,    How long can you walk comfortably?  1 mile  cant do normal 3 miles,    Patient Stated Goals  Pt wants to return to PLOF without pain    Currently in Pain?  Yes    Pain Score  5     Pain Location  Knee    Pain Orientation  Right    Pain Descriptors / Indicators  Aching;Sore    Pain Type  Acute pain    Pain Onset  More than a month ago    Pain Frequency  Intermittent    Aggravating Factors   walking on steps especially up stairs, cant return to normal workout routine , household chores  would like to work on [place] without exacerbating pain         [facility] PT Assessment - 08/01/19 0001      Assessment   Medical Diagnosis  Rt knee pain    Referring Provider (PT)  Stefanie Libel MD    Onset  Date/Surgical Date  06/19/19    Hand Dominance  Right    Next MD Visit  end of October    Prior Therapy  3 session in [place]      Precautions   Precautions  None      Restrictions   Weight Bearing Restrictions  No      Balance Screen   Has the patient fallen in the past 6 months  No    Has the patient had a decrease in activity level because of a fear of falling?   No    Is the patient reluctant to leave their home because of a fear of falling?   No      Home Environment   Living Environment  Private residence    Type of Home  Apartment    Home Access  Stairs to enter    Entrance Stairs-Number of Steps  7    Entrance Stairs-Rails  Can reach both      Prior Function   Level of  Independence  Independent      Cognition   Overall Cognitive Status  Within Functional Limits for tasks assessed      Observation/Other Assessments   Focus on Therapeutic Outcomes (FOTO)   FOTO intake 49%  limitaion 51% predicted 35%      Observation/Other Assessments-Edema    Edema  Circumferential      Circumferential Edema   Circumferential - Right  39.0 cm    Circumferential - Left   38.0 cm      Sensation   Light Touch  Appears Intact      Posture/Postural Control   Posture Comments  Pt knee patella tilts laterally on right      ROM / Strength   AROM / PROM / Strength  AROM;Strength      AROM   Overall AROM   Deficits    Right Knee Extension  9    Right Knee Flexion  135   PROM only tolerates 137 pain Prone flex 8 in from buttock   Left Knee Extension  0    Left Knee Flexion  145   149 PROM Prone knee flex 2 inch from buttock     Strength   Overall Strength  Deficits    Right Hip Flexion  5/5    Right Hip Extension  4-/5    Right Hip ABduction  4-/5    Left Hip Flexion  5/5    Left Hip Extension  4/5    Left Hip ABduction  4/5    Right Knee Flexion  4/5    Right Knee Extension  4/5    Left Knee Flexion  5/5    Left Knee Extension  5/5      Palpation   Patella mobility   Pt with decreased patellar mobility on right knee,  WNL on left    Palpation comment  tenderness over tibial tuberosity and over distal anterior quad                Objective measurements completed on examination: See above findings.      [facility] Adult PT Treatment/Exercise - 08/01/19 0001      Knee/Hip Exercises: Stretches   Active Hamstring Stretch  3 reps;Right;30 seconds    Active Hamstring Stretch Limitations  sitting    Other Knee/Hip Stretches  prone knee/quad stretch 30 sec xl 3 with strap      Knee/Hip Exercises: Standing   Other Standing Knee Exercises  sink squat 2 x 10                   PT Long Term Goals - 08/01/19 0840      PT LONG TERM GOAL #1   Title  Pt will be independent with advanced HEP.    Time  6    Period  Weeks    Status  New    Target Date  09/12/19      PT LONG TERM GOAL #2   Title  Pt will be able to ascend/descend steps without exacerbating right knee pain    Time  6    Period  Weeks    Status  New    Target Date  09/12/19      PT LONG TERM GOAL #3   Title  Pt will be able to return to exericise routine including 3 mile walks for exericise    Baseline  unable to tolerate more than a mile without pain in right knee    Time  6    Period  Weeks    Status  New    Target Date  09/12/19      PT LONG TERM GOAL #4   Title  Pt will improve R knee extensor/flexor strength to >/= 4+/5 with </= 2/10 pain to promote safety with walking/standing activities    Time  6    Period  Weeks    Status  New    Target Date  09/12/19      PT LONG TERM GOAL #5   Title  FOTO will improve from 51% limitation   to 35% limitation    indicating improved functional mobility    Baseline  eval 51% limitation    Time  6    Period  Weeks    Status  New    Target Date  09/12/19             Plan - 08/01/19 0758    Clinical Impression Statement  Pt with 7 weeks of knee pain with at worse 8/10 pain, enters clinic with 5/10    Pt with quad  tightness on R> L.  Pt with anterior right knee pain exacerbated with activity and a lateral tilt of patellla.  Pt would benefit from skilled PT for 2 times a week for 6 weeks to address above impariments and functional limitations and return to pain-free PLOF. and exericise routine of 3 mile walk    Personal Factors and Comorbidities  Comorbidity 1    Comorbidities  Hashimotos dz, OA, temporal sz well controlled    Examination-Activity Limitations  Stairs;Transfers;Squat;Other   return to normal exercise routine walking 3 miles   Stability/Clinical Decision Making  Stable/Uncomplicated    Clinical Decision Making  Low    Rehab Potential  Excellent    PT Frequency  2x / week    PT Duration  6 weeks    PT Treatment/Interventions  Cryotherapy;Electrical Stimulation;Iontophoresis 4mg /ml Dexamethasone;Moist Heat;Ultrasound;Gait training;Stair training;Functional mobility training;Therapeutic activities;Therapeutic exercise;Neuromuscular re-education;Manual techniques;Patient/family education;Passive range of motion;Dry needling;Taping;Joint Manipulations    PT Next Visit Plan  Review HEP  Add strengthening,  pt with really tight Right Quad, patellar mobs to RT, will start deadlifts for active hamstring stretch   can try taping if patella mobs are good.    PT Home Exercise Plan  prone quad stretch , sitting hamstring stretch and sink squat for strengthe    Consulted and Agree with Plan of Care  Patient       Patient will benefit from skilled therapeutic intervention in order to improve the following deficits and impairments:  Pain, Impaired flexibility, Decreased range of motion, Decreased strength, Difficulty walking  Visit Diagnosis: Acute pain of right knee  Stiffness of right knee, not elsewhere classified  Muscle weakness (generalized)     Problem List Patient Active Problem List   Diagnosis Date Noted  . Osteoarthritis 07/17/2019  . Family history of heart disease 12/11/2018  .  Atypical chest pain 12/11/2018  . B12 deficiency 10/16/2017  . Elevated BP without diagnosis of hypertension 10/01/2017  . Osteoporosis 10/01/2017  . Uterine fibroid 07/31/2017  . History of cleft palate 11/22/2016  . 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 Normocytic anemia 07/31/2016  . Screen for colon cancer 07/31/2016  . Iron deficiency 04/27/2016  . Hypothyroidism 12/17/2012  . Insulin resistance 12/17/2012  . Auras 10/15/2012  . Hashimoto's thyroiditis 07/24/2012  . Hyperlipidemia 07/24/2012  . 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 Basal metabolism function study abnormality 03/07/2012  .  Pituitary microadenoma ([place]) 01/05/2012  . Dyspnea 07/09/2011  . Pulmonary nodules 07/09/2011  . 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 Dysmetabolic syndrome X 123XX123  . Growth hormone deficiency ([place]) 03/28/2011  . Low HDL (under 40) 03/28/2011  . GERD (gastroesophageal reflux disease) 01/01/2009  . Obesity 05/19/2008  . Vitamin D deficiency 10/07/2007   Voncille Lo, PT Certified Exercise Expert for the Aging Adult  08/01/19 1:06 PM Phone: [phone] Fax: [facility] [address] [place], [place], [address] Phone: [phone]   Fax:  [phone]  Name: Kim Kim MRN: [id number] Date of Birth: January 14, 1956

## 2019-08-01 NOTE — Patient Instructions (Addendum)
Leg Extension (Hamstring)   Sit toward front edge of chair, with leg out straight, heel on floor, toes pointing toward body. Keeping back straight, bend forward at hip, breathing out through pursed lips. Return, breathing in. Repeat _3__ times. Repeat with other leg. Do _3__ sessions per day.  Hold for 30 sec Variation: Perform from standing position, with support.  OR  Hamstring Stretch   With other leg bent, foot flat, grasp right leg and slowly try to straighten knee. Hold __30__ seconds. Repeat _3___ times. Do _3___ sessions per day. Can use strap as well around ball of feet and dorsiflex ankle    Quads / HF, Prone   Lie face down, knees together. Grasp one ankle with same-side hand. Use towel if needed to reach. Gently pull foot toward buttock. Hold _30__ seconds. Repeat _3__ times per session. Do _2-3__ sessions per day.         Kim Kim, PT Certified Exercise Expert for the Aging Adult  08/01/19 8:27 AM Phone: [phone] Fax: [phone]  .

## 2019-08-04 ENCOUNTER — Encounter: Payer: Self-pay | Admitting: Physical Therapy

## 2019-08-04 ENCOUNTER — Ambulatory Visit: Payer: BC Managed Care – PPO | Admitting: Physical Therapy

## 2019-08-04 ENCOUNTER — Other Ambulatory Visit: Payer: Self-pay

## 2019-08-04 DIAGNOSIS — M25561 Pain in right knee: Secondary | ICD-10-CM | POA: Diagnosis not present

## 2019-08-04 DIAGNOSIS — M25661 Stiffness of right knee, not elsewhere classified: Secondary | ICD-10-CM | POA: Diagnosis not present

## 2019-08-04 DIAGNOSIS — M6281 Muscle weakness (generalized): Secondary | ICD-10-CM

## 2019-08-04 NOTE — Therapy (Signed)
[place], [place], [address] Phone: [phone]   Fax:  [phone]  Physical Therapy Treatment  Patient Details  Name: Kim Kim MRN: [id number] Date of Birth: February 11, 1956 Referring Provider (PT): Stefanie Libel MD   Encounter Date: 08/04/2019  PT End of Session - 08/04/19 1104    Visit Number  2    Number of Visits  13    Date for PT Re-Evaluation  09/12/19    Authorization Type  BCBS    PT Start Time  1100    PT Stop Time  1158    PT Time Calculation (min)  58 min       Past Medical History:  Diagnosis Date  . Seizures ([place])   . Thyroid disease     Past Surgical History:  Procedure Laterality Date  . none      There were no vitals filed for this visit.  Subjective Assessment - 08/04/19 1103    Subjective  No pain just sore. I have been doing a lot of walking. Did 3.3 miles yesterday walking.    Currently in Pain?  No/denies                       [facility] Adult PT Treatment/Exercise - 08/04/19 0001      Knee/Hip Exercises: Stretches   Active Hamstring Stretch  3 reps;Right;30 seconds    Active Hamstring Stretch Limitations  sitting    Gastroc Stretch  3 reps;30 seconds    Other Knee/Hip Stretches  prone knee/quad stretch 30 sec xl 3 with strap   cues to prop on elbows     Knee/Hip Exercises: Standing   Lateral Step Up  10 reps;Hand Hold: 1;Step Height: 4"    Forward Step Up  10 reps;Hand Hold: 1;Step Height: 6"    Functional Squat Limitations  at sink, pain    Other Standing Knee Exercises  sink squat 2 x 10       Knee/Hip Exercises: Supine   Short Arc Quad Sets  10 reps    Straight Leg Raise with External Rotation  15 reps      Modalities   Modalities  Cryotherapy      Cryotherapy   Number Minutes Cryotherapy  15 Minutes    Cryotherapy Location  Knee    Type of Cryotherapy  Ice pack      Manual Therapy   Manual Therapy  Soft tissue mobilization    Soft  tissue mobilization  Prone to medial hamstring, supine to adductor, VMO, patella mobs                   PT Long Term Goals - 08/01/19 0840      PT LONG TERM GOAL #1   Title  Pt will be independent with advanced HEP.    Time  6    Period  Weeks    Status  New    Target Date  09/12/19      PT LONG TERM GOAL #2   Title  Pt will be able to ascend/descend steps without exacerbating right knee pain    Time  6    Period  Weeks    Status  New    Target Date  09/12/19      PT LONG TERM GOAL #3   Title  Pt will be able to return to exericise routine including 3 mile walks for exericise    Baseline  unable  to tolerate more than a mile without pain in right knee    Time  6    Period  Weeks    Status  New    Target Date  09/12/19      PT LONG TERM GOAL #4   Title  Pt will improve R knee extensor/flexor strength to >/= 4+/5 with </= 2/10 pain to promote safety with walking/standing activities    Time  6    Period  Weeks    Status  New    Target Date  09/12/19      PT LONG TERM GOAL #5   Title  FOTO will improve from 51% limitation   to 35% limitation    indicating improved functional mobility    Baseline  eval 51% limitation    Time  6    Period  Weeks    Status  New    Target Date  09/12/19            Plan - 08/04/19 1145    Clinical Impression Statement  Pt reports feeling much better since walking and doing HEP. Began closed chain strength which was painful with step ups. Continued with manual soft tissue and patella mobs performed to decreased pain at medial anterior knee. Tenderness at pes anserine noted. Trial of ice to this area at end of sesson. Worked on quad activation. She reported feeling better after manual.    PT Next Visit Plan  Review HEP  Add strengthening,  pt with really tight Right Quad, patellar mobs to RT, will start deadlifts for active hamstring stretch   can try taping if patella mobs are good.    PT Home Exercise Plan  prone quad stretch ,  sitting hamstring stretch and sink squat for strengthe       Patient will benefit from skilled therapeutic intervention in order to improve the following deficits and impairments:  Pain, Impaired flexibility, Decreased range of motion, Decreased strength, Difficulty walking  Visit Diagnosis: Acute pain of right knee  Stiffness of right knee, not elsewhere classified  Muscle weakness (generalized)     Problem List Patient Active Problem List   Diagnosis Date Noted  . Osteoarthritis 07/17/2019  . Family history of heart disease 12/11/2018  . Atypical chest pain 12/11/2018  . B12 deficiency 10/16/2017  . Elevated BP without diagnosis of hypertension 10/01/2017  . Osteoporosis 10/01/2017  . Uterine fibroid 07/31/2017  . History of cleft palate 11/22/2016  . 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 Normocytic anemia 07/31/2016  . Screen for colon cancer 07/31/2016  . Iron deficiency 04/27/2016  . Hypothyroidism 12/17/2012  . Insulin resistance 12/17/2012  . Auras 10/15/2012  . Hashimoto's thyroiditis 07/24/2012  . Hyperlipidemia 07/24/2012  . 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 Basal metabolism function study abnormality 03/07/2012  . Pituitary microadenoma ([place]) 01/05/2012  . Dyspnea 07/09/2011  . Pulmonary nodules 07/09/2011  . 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 Dysmetabolic syndrome X 123XX123  . Growth hormone deficiency ([place]) 03/28/2011  . Low HDL (under 40) 03/28/2011  . GERD (gastroesophageal reflux disease) 01/01/2009  . Obesity 05/19/2008  . Vitamin D deficiency 10/07/2007    Dorene Ar, PTA 08/04/2019, 11:47 AM  [facility] [address] [place], [place], [address] Phone: [phone]   Fax:  [phone]  Name: Kim Kim MRN: [id number] Date of Birth: [birth year]

## 2019-08-12 ENCOUNTER — Encounter: Payer: Self-pay | Admitting: Physical Therapy

## 2019-08-12 ENCOUNTER — Other Ambulatory Visit: Payer: Self-pay

## 2019-08-12 ENCOUNTER — Ambulatory Visit: Payer: BC Managed Care – PPO | Admitting: Physical Therapy

## 2019-08-12 DIAGNOSIS — M6281 Muscle weakness (generalized): Secondary | ICD-10-CM | POA: Diagnosis not present

## 2019-08-12 DIAGNOSIS — M25661 Stiffness of right knee, not elsewhere classified: Secondary | ICD-10-CM | POA: Diagnosis not present

## 2019-08-12 DIAGNOSIS — M25561 Pain in right knee: Secondary | ICD-10-CM

## 2019-08-12 NOTE — Therapy (Signed)
[place], [place], [address] Phone: [phone]   Fax:  [phone]  Physical Therapy Treatment  Patient Details  Name: Kim Kim MRN: [id number] Date of Birth: 07-Feb-1956 Referring Provider (PT): Stefanie Libel MD   Encounter Date: 08/12/2019  PT End of Session - 08/12/19 0922    Visit Number  3    Number of Visits  13    Date for PT Re-Evaluation  09/12/19    Authorization Type  BCBS    PT Start Time  0800    PT Stop Time  0845    PT Time Calculation (min)  45 min       Past Medical History:  Diagnosis Date  . Seizures ([place])   . Thyroid disease     Past Surgical History:  Procedure Laterality Date  . none      There were no vitals filed for this visit.                    [facility] Adult PT Treatment/Exercise - 08/12/19 0001      Knee/Hip Exercises: Stretches   Active Hamstring Stretch  3 reps;Right;30 seconds    Active Hamstring Stretch Limitations  supine with strap    ITB Stretch Limitations  ITB and adductor stretch supine with strap 3 x 30 sec each     Gastroc Stretch  3 reps;30 seconds    Other Knee/Hip Stretches  prone knee/quad stretch 30 sec xl 3 with strap   cues to prop on elbows   Other Knee/Hip Stretches  slant board, heel off edge of step       Knee/Hip Exercises: Standing   Lateral Step Up  5 reps;Step Height: 6"    Lateral Step Up Limitations  pain, tape donned     Forward Step Up  10 reps;Hand Hold: 1;Step Height: 6"    Forward Step Up Limitations  decreased pain with tape     Functional Squat Limitations  at sink, tape donned       Knee/Hip Exercises: Seated   Long Arc Quad  15 reps;Left    Long Arc Quad Limitations  ball between knees       Knee/Hip Exercises: Supine   Quad Sets  10 reps    Bridges with [employer]  15 reps    Straight Leg Raise with External Rotation  15 reps      Modalities   Modalities  Iontophoresis      Iontophoresis    Type of Iontophoresis  Dexamethasone    Location  medial/posterior right knee     Dose  25ml    Time  6 hour patch      Manual Therapy   Manual Therapy  Taping    Soft tissue mobilization  Prone and supine to medial hamstring, supine to adductor, VMO, patella mobs     [place]  Medial pull                   PT Long Term Goals - 08/01/19 0840      PT LONG TERM GOAL #1   Title  Pt will be independent with advanced HEP.    Time  6    Period  Weeks    Status  New    Target Date  09/12/19      PT LONG TERM GOAL #2   Title  Pt will be able to ascend/descend steps without exacerbating right knee pain  Time  6    Period  Weeks    Status  New    Target Date  09/12/19      PT LONG TERM GOAL #3   Title  Pt will be able to return to exericise routine including 3 mile walks for exericise    Baseline  unable to tolerate more than a mile without pain in right knee    Time  6    Period  Weeks    Status  New    Target Date  09/12/19      PT LONG TERM GOAL #4   Title  Pt will improve R knee extensor/flexor strength to >/= 4+/5 with </= 2/10 pain to promote safety with walking/standing activities    Time  6    Period  Weeks    Status  New    Target Date  09/12/19      PT LONG TERM GOAL #5   Title  FOTO will improve from 51% limitation   to 35% limitation    indicating improved functional mobility    Baseline  eval 51% limitation    Time  6    Period  Weeks    Status  New    Target Date  09/12/19            Plan - 08/12/19 0918    Clinical Impression Statement  Pt reports overall decreased pain. She is still tender medial knee, thigh and calf. Patella mobs and sodt tissue work decreased tenderness. Patella mobs and tape applied to decrease lateral tacking, Less pain with step up following tape.Trial of ionto to medial knee to decrease pain.    PT Next Visit Plan  How was Mcconnel tape and how was ionto? Review HEP  Add strengthening,  pt with really tight Right  Quad, patellar mobs to RT, will start deadlifts for active hamstring stretch   can try taping if patella mobs are good.       Patient will benefit from skilled therapeutic intervention in order to improve the following deficits and impairments:  Pain, Impaired flexibility, Decreased range of motion, Decreased strength, Difficulty walking  Visit Diagnosis: Acute pain of right knee  Stiffness of right knee, not elsewhere classified  Muscle weakness (generalized)     Problem List Patient Active Problem List   Diagnosis Date Noted  . Osteoarthritis 07/17/2019  . Family history of heart disease 12/11/2018  . Atypical chest pain 12/11/2018  . B12 deficiency 10/16/2017  . Elevated BP without diagnosis of hypertension 10/01/2017  . Osteoporosis 10/01/2017  . Uterine fibroid 07/31/2017  . History of cleft palate 11/22/2016  . 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 Normocytic anemia 07/31/2016  . Screen for colon cancer 07/31/2016  . Iron deficiency 04/27/2016  . Hypothyroidism 12/17/2012  . Insulin resistance 12/17/2012  . Auras 10/15/2012  . Hashimoto's thyroiditis 07/24/2012  . Hyperlipidemia 07/24/2012  . 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 Basal metabolism function study abnormality 03/07/2012  . Pituitary microadenoma ([place]) 01/05/2012  . Dyspnea 07/09/2011  . Pulmonary nodules 07/09/2011  . 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 Dysmetabolic syndrome X 123XX123  . Growth hormone deficiency ([place]) 03/28/2011  . Low HDL (under 40) 03/28/2011  . GERD (gastroesophageal reflux disease) 01/01/2009  . Obesity 05/19/2008  . Vitamin D deficiency 10/07/2007    Dorene Ar , PTA 08/12/2019, 9:23 AM  [facility] [address] [place], [place], [address] Phone: [phone]  Fax:  [phone]  Name: Rianne Basara MRN: [id number] Date of Birth: 02/21/1956

## 2019-08-15 ENCOUNTER — Other Ambulatory Visit: Payer: Self-pay

## 2019-08-15 ENCOUNTER — Ambulatory Visit: Payer: BC Managed Care – PPO | Admitting: Physical Therapy

## 2019-08-15 DIAGNOSIS — M25561 Pain in right knee: Secondary | ICD-10-CM

## 2019-08-15 DIAGNOSIS — M6281 Muscle weakness (generalized): Secondary | ICD-10-CM

## 2019-08-15 DIAGNOSIS — M25661 Stiffness of right knee, not elsewhere classified: Secondary | ICD-10-CM | POA: Diagnosis not present

## 2019-08-15 NOTE — Therapy (Signed)
[place], [place], [address] Phone: [phone]   Fax:  [phone]  Physical Therapy Treatment  Patient Details  Name: Kim Kim MRN: [id number] Date of Birth: Dec 04, 1955 Referring Provider (PT): Stefanie Libel MD   Encounter Date: 08/15/2019  PT End of Session - 08/15/19 0800    Visit Number  4    Number of Visits  13    Date for PT Re-Evaluation  09/12/19    Authorization Type  BCBS    PT Start Time  0800    PT Stop Time  0845    PT Time Calculation (min)  45 min    Activity Tolerance  Patient tolerated treatment well    Behavior During Therapy  [facility] for tasks assessed/performed       Past Medical History:  Diagnosis Date  . Seizures ([place])   . Thyroid disease     Past Surgical History:  Procedure Laterality Date  . none      There were no vitals filed for this visit.  Subjective Assessment - 08/15/19 0803    Subjective  Pain was so much better with the patch and the taping. I have been walking 2 miles.  I got my pelotoon bike and I had no weights and rode for 15 minutes    Pertinent History  hashimoto dz,    How long can you walk comfortably?  I have been walking 2 miles    Patient Stated Goals  Pt wants to return to PLOF without pain    Pain Score  3     Pain Location  Knee    Pain Orientation  Right    Pain Descriptors / Indicators  Aching;Sore    Pain Type  Acute pain    Pain Onset  More than a month ago    Pain Frequency  Intermittent    Multiple Pain Sites  Yes    Pain Score  2    Pain Location  Knee    Pain Orientation  Left    Pain Descriptors / Indicators  Aching;Tightness    Pain Type  Acute pain    Pain Onset  More than a month ago    Pain Frequency  Intermittent    Aggravating Factors   starting Peloton for the first time,                       [facility] Adult PT Treatment/Exercise - 08/15/19 0807      Knee/Hip Exercises: Stretches   Active Hamstring  Stretch  3 reps;Right;30 seconds    Active Hamstring Stretch Limitations  supine with strap    Quad Stretch  3 reps;30 seconds;Right    Quad Stretch Limitations  using green strap in prone    ITB Stretch Limitations  ITB and adductor stretch supine with strap 3 x 30 sec each     Gastroc Stretch  3 reps;30 seconds    Other Knee/Hip Stretches  slant board, heel off edge of step       Knee/Hip Exercises: Standing   Lateral Step Up  Step Height: 6";10 reps;Right;Hand Hold: 1    Lateral Step Up Limitations  pain, tape donned     Forward Step Up  10 reps;Hand Hold: 1;Step Height: 6"    Functional Squat Limitations  at sink, tape donned       Knee/Hip Exercises: Supine   Quad Sets  10 reps    Bridges with [employer]  Squeeze  15 reps;2 sets;Both    Straight Leg Raise with External Rotation Limitations  15 reps x 2      Modalities   Modalities  Iontophoresis      Iontophoresis   Type of Iontophoresis  Dexamethasone    Location  medial/posterior right knee     Dose  67ml    Time  6 hour patch      Manual Therapy   Manual Therapy  Taping    Joint Mobilization  Right subtalar PA grade 3/4    Soft tissue mobilization  Prone and supine to medial hamstring, supine to adductor, VMO, patella mobs    R and then Left medial hamstring   McConnell  Medial pull                   PT Long Term Goals - 08/15/19 [id number]      PT LONG TERM GOAL #1   Title  Pt will be independent with advanced HEP.    Baseline  Pt compliant with initial exercises    Time  6    Period  Weeks    Status  On-going    Target Date  09/12/19      PT LONG TERM GOAL #2   Title  Pt will be able to ascend/descend steps without exacerbating right knee pain    Baseline  Pt reports ascends /descends steps with 25 % greater ease    Time  6    Period  Weeks    Status  On-going    Target Date  09/12/19      PT LONG TERM GOAL #3   Title  Pt will be able to return to exericise routine including 3 mile walks for exericise     Baseline  Pt now able to walk 2 miles , working toward goal    Time  6    Period  Weeks    Status  On-going    Target Date  09/12/19      PT LONG TERM GOAL #4   Title  Pt will improve R knee extensor/flexor strength to >/= 4+/5 with </= 2/10 pain to promote safety with walking/standing activities    Baseline  Pt R knee 4/10 , mcconnell taping and iontophoresis helps    Time  6    Period  Weeks    Target Date  09/12/19      PT LONG TERM GOAL #5   Title  FOTO will improve from 51% limitation   to 35% limitation    indicating improved functional mobility    Baseline  eval 51% limitation    Time  6    Period  Weeks    Status  On-going    Target Date  09/12/19            Plan - 08/15/19 0839    Clinical Impression Statement  Pt reports overall decreased pain, but also pain in medial left knee this week same area as right.  Pt receives relief from discomfort with Iontophoresis  and taping.and soft tissue work and mobilization.  Pt now has Peloton bike and is able to walk for 2 miles now.  Pt cautioned not to overstretch and perform hamsting stretch/ adductor stretch with knee straight and not bent for best benfit of stretches    Personal Factors and Comorbidities  Comorbidity 1    Comorbidities  Hashimotos dz, OA, temporal sz well controlled    Examination-Activity Limitations  Stairs;Transfers;Squat;Other  Stability/Clinical Decision Making  Stable/Uncomplicated    Clinical Decision Making  Low    Rehab Potential  Excellent    PT Frequency  2x / week    PT Duration  6 weeks    PT Treatment/Interventions  Cryotherapy;Electrical Stimulation;Iontophoresis 4mg /ml Dexamethasone;Moist Heat;Ultrasound;Gait training;Stair training;Functional mobility training;Therapeutic activities;Therapeutic exercise;Neuromuscular re-education;Manual techniques;Patient/family education;Passive range of motion;Dry needling;Taping;Joint Manipulations    PT Next Visit Plan  Teach Mc connell taping ,  self patellar mobs progress deadlift    PT Home Exercise Plan  prone quad stretch , sitting hamstring stretch and sink squat for strengthe       Patient will benefit from skilled therapeutic intervention in order to improve the following deficits and impairments:  Pain, Impaired flexibility, Decreased range of motion, Decreased strength, Difficulty walking  Visit Diagnosis: Acute pain of right knee  Stiffness of right knee, not elsewhere classified  Muscle weakness (generalized)     Problem List Patient Active Problem List   Diagnosis Date Noted  . Osteoarthritis 07/17/2019  . Family history of heart disease 12/11/2018  . Atypical chest pain 12/11/2018  . B12 deficiency 10/16/2017  . Elevated BP without diagnosis of hypertension 10/01/2017  . Osteoporosis 10/01/2017  . Uterine fibroid 07/31/2017  . History of cleft palate 11/22/2016  . 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 Normocytic anemia 07/31/2016  . Screen for colon cancer 07/31/2016  . Iron deficiency 04/27/2016  . Hypothyroidism 12/17/2012  . Insulin resistance 12/17/2012  . Auras 10/15/2012  . Hashimoto's thyroiditis 07/24/2012  . Hyperlipidemia 07/24/2012  . 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 Basal metabolism function study abnormality 03/07/2012  . Pituitary microadenoma ([place]) 01/05/2012  . Dyspnea 07/09/2011  . Pulmonary nodules 07/09/2011  . 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 Dysmetabolic syndrome X 123XX123  . Growth hormone deficiency ([place]) 03/28/2011  . Low HDL (under 40) 03/28/2011  . GERD (gastroesophageal reflux disease) 01/01/2009  . Obesity 05/19/2008  . Vitamin D deficiency 10/07/2007    Voncille Lo, PT Certified Exercise Expert for the Aging Adult  08/15/19 9:21 AM Phone: [phone] Fax: [facility] [address] [place], [place], [address] Phone: [phone]   Fax:   [phone]  Name: Katanya Krasnow MRN: [id number] Date of Birth: 1956/02/16

## 2019-08-19 ENCOUNTER — Other Ambulatory Visit: Payer: Self-pay

## 2019-08-19 ENCOUNTER — Ambulatory Visit: Payer: BC Managed Care – PPO | Admitting: Physical Therapy

## 2019-08-19 DIAGNOSIS — M25661 Stiffness of right knee, not elsewhere classified: Secondary | ICD-10-CM | POA: Diagnosis not present

## 2019-08-19 DIAGNOSIS — M25561 Pain in right knee: Secondary | ICD-10-CM

## 2019-08-19 DIAGNOSIS — M6281 Muscle weakness (generalized): Secondary | ICD-10-CM | POA: Diagnosis not present

## 2019-08-19 NOTE — Therapy (Signed)
[place], [place], [address] Phone: [phone]   Fax:  [phone]  Physical Therapy Treatment  Patient Details  Name: Kim Kim MRN: [id number] Date of Birth: 1956/05/12 Referring Provider (PT): Stefanie Libel MD   Encounter Date: 08/19/2019  PT End of Session - 08/19/19 0802    Visit Number  5    Number of Visits  13    Date for PT Re-Evaluation  09/12/19    Authorization Type  BCBS    PT Start Time  0801    PT Stop Time  0850    PT Time Calculation (min)  49 min    Activity Tolerance  Patient tolerated treatment well    Behavior During Therapy  [facility] for tasks assessed/performed       Past Medical History:  Diagnosis Date  . Seizures ([place])   . Thyroid disease     Past Surgical History:  Procedure Laterality Date  . none      There were no vitals filed for this visit.  Subjective Assessment - 08/19/19 0804    Subjective  I am walking consistently a mile today. I am using my Peloton bike    Pertinent History  hashimoto dz,    How long can you walk comfortably?  I can walk close to two miles    Patient Stated Goals  Pt wants to return to PLOF without pain    Currently in Pain?  Yes    Pain Score  2     Pain Location  Knee    Pain Orientation  Right    Pain Descriptors / Indicators  Dull;Aching    Pain Onset  More than a month ago    Pain Frequency  Intermittent    Multiple Pain Sites  Yes    Pain Score  2    Pain Location  Knee    Pain Orientation  Left    Pain Descriptors / Indicators  Aching;Dull    Pain Onset  More than a month ago    Pain Frequency  Intermittent                       [facility] Adult PT Treatment/Exercise - 08/19/19 0001      Self-Care   Self-Care  Other Self-Care Comments    Other Self-Care Comments   educated and return demo for self patellar taping      Knee/Hip Exercises: Public affairs consultant Limitations  lunge step with left knee on  pillow with PPT and tall kneeling with knee flexion x 10 for anterior chain dynamic stretch    ITB Stretch Limitations  ITB and adductor stretch supine with strap 3 x 30 sec each     Other Knee/Hip Stretches  prone knee/quad stretch 30 sec xl 3 with strap   cues to prop on elbows   Other Knee/Hip Stretches  slant board, heel off edge of step       Knee/Hip Exercises: Standing   Lateral Step Up  Both;2 sets;15 reps;Step Height: 8"    Lateral Step Up Limitations  with 15 # KB    Forward Step Up  Both;2 sets;15 reps;Hand Hold: 1    Forward Step Up Limitations  with 15 # KB    Step Down  2 sets;Both;15 reps;Step Height: 4"    Step Down Limitations  --    Other Standing Knee Exercises  [place] deadlift off  4 inch step  for hamstring stretch  5 x 3 with 30 # KB      Knee/Hip Exercises: Seated   Long Arc Quad  15 reps;Left    Sit to Sand  15 reps;2 sets   10 KB weight     Knee/Hip Exercises: Supine   Bridges with Ball Squeeze  2 sets;Both;10 reps   7.5 lb   Straight Leg Raise with External Rotation Limitations  15 reps x 2   7.5 lb     Modalities   Modalities  Iontophoresis      Manual Therapy   Manual Therapy  Taping;Soft tissue mobilization    Soft tissue mobilization  prone hamstring stretch with IASTYM tool to medial hamstrings bil    McConnell  Medial pull                   PT Long Term Goals - 08/15/19 [id number]      PT LONG TERM GOAL #1   Title  Pt will be independent with advanced HEP.    Baseline  Pt compliant with initial exercises    Time  6    Period  Weeks    Status  On-going    Target Date  09/12/19      PT LONG TERM GOAL #2   Title  Pt will be able to ascend/descend steps without exacerbating right knee pain    Baseline  Pt reports ascends /descends steps with 25 % greater ease    Time  6    Period  Weeks    Status  On-going    Target Date  09/12/19      PT LONG TERM GOAL #3   Title  Pt will be able to return to exericise routine including 3 mile  walks for exericise    Baseline  Pt now able to walk 2 miles , working toward goal    Time  6    Period  Weeks    Status  On-going    Target Date  09/12/19      PT LONG TERM GOAL #4   Title  Pt will improve R knee extensor/flexor strength to >/= 4+/5 with </= 2/10 pain to promote safety with walking/standing activities    Baseline  Pt R knee 4/10 , mcconnell taping and iontophoresis helps    Time  6    Period  Weeks    Target Date  09/12/19      PT LONG TERM GOAL #5   Title  FOTO will improve from 51% limitation   to 35% limitation    indicating improved functional mobility    Baseline  eval 51% limitation    Time  6    Period  Weeks    Status  On-going    Target Date  09/12/19            Plan - 08/19/19 [id number]    Clinical Impression Statement  Pt returns to clinic with 2/10 pain in both medial hamstrings of knees.  Pt with weakness but improving in flexibility and able to perform advanced stretching of knees. Pt also with hip weakness that contributes to imbalance.  Pt educated on self patellar taping with medial pull and returned demo of proper technique. will continue with goals assessed next visit    Personal Factors and Comorbidities  Comorbidity 1    Comorbidities  Hashimotos dz, OA, temporal sz well controlled    Examination-Activity Limitations  Stairs;Transfers;Squat;Other    Stability/Clinical Decision Making  Stable/Uncomplicated    Clinical Decision Making  Low    Rehab Potential  Excellent    PT Frequency  2x / week    PT Duration  6 weeks    PT Treatment/Interventions  Cryotherapy;Electrical Stimulation;Iontophoresis 4mg /ml Dexamethasone;Moist Heat;Ultrasound;Gait training;Stair training;Functional mobility training;Therapeutic activities;Therapeutic exercise;Neuromuscular re-education;Manual techniques;Patient/family education;Passive range of motion;Dry needling;Taping;Joint Manipulations    PT Next Visit Plan  deadlift, hip exericises  lunges  and advanced HEP.   GOALs this visit    PT Home Exercise Plan  prone quad stretch , sitting hamstring stretch and sink squat for strengthe dead lift ,    Consulted and Agree with Plan of Care  Patient       Patient will benefit from skilled therapeutic intervention in order to improve the following deficits and impairments:  Pain, Impaired flexibility, Decreased range of motion, Decreased strength, Difficulty walking  Visit Diagnosis: Acute pain of right knee  Stiffness of right knee, not elsewhere classified  Muscle weakness (generalized)     Problem List Patient Active Problem List   Diagnosis Date Noted  . Osteoarthritis 07/17/2019  . Family history of heart disease 12/11/2018  . Atypical chest pain 12/11/2018  . B12 deficiency 10/16/2017  . Elevated BP without diagnosis of hypertension 10/01/2017  . Osteoporosis 10/01/2017  . Uterine fibroid 07/31/2017  . History of cleft palate 11/22/2016  . 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 Normocytic anemia 07/31/2016  . Screen for colon cancer 07/31/2016  . Iron deficiency 04/27/2016  . Hypothyroidism 12/17/2012  . Insulin resistance 12/17/2012  . Auras 10/15/2012  . Hashimoto's thyroiditis 07/24/2012  . Hyperlipidemia 07/24/2012  . 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 Basal metabolism function study abnormality 03/07/2012  . Pituitary microadenoma ([place]) 01/05/2012  . Dyspnea 07/09/2011  . Pulmonary nodules 07/09/2011  . 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 Dysmetabolic syndrome X 123XX123  . Growth hormone deficiency ([place]) 03/28/2011  . Low HDL (under 40) 03/28/2011  . GERD (gastroesophageal reflux disease) 01/01/2009  . Obesity 05/19/2008  . Vitamin D deficiency 10/07/2007   Voncille Lo, PT Certified Exercise Expert for the Aging Adult  08/19/19 9:08 AM Phone: [phone] Fax: [facility] [address] [place], [place],  [address] Phone: [phone]   Fax:  [phone]  Name: Kim Kim MRN: [id number] Date of Birth: [birth year]

## 2019-08-22 ENCOUNTER — Ambulatory Visit: Payer: BC Managed Care – PPO | Admitting: Physical Therapy

## 2019-08-22 ENCOUNTER — Other Ambulatory Visit: Payer: Self-pay

## 2019-08-22 ENCOUNTER — Encounter: Payer: Self-pay | Admitting: Physical Therapy

## 2019-08-22 DIAGNOSIS — M25661 Stiffness of right knee, not elsewhere classified: Secondary | ICD-10-CM | POA: Diagnosis not present

## 2019-08-22 DIAGNOSIS — M6281 Muscle weakness (generalized): Secondary | ICD-10-CM

## 2019-08-22 DIAGNOSIS — M25561 Pain in right knee: Secondary | ICD-10-CM

## 2019-08-22 NOTE — Patient Instructions (Addendum)
Step Down: Anterior   From 4-6" step, reach one leg forward as far as possible, still able to return easily. Touch toe and heel to floor. Return to single leg balance. Repeat with other leg. Repeat __50__ times. Do __1__ sessions per day. At start, hold __5__ pound dumbbells at: knee height. shoulder height. waist height. On return, bring weights: to waist. to shoulders. over head.  Copyright  VHI. All rights reserved.  Step-Up: Lateral   Step up to side with right leg. Bring other foot up onto __6__ inch step. Return to floor position with left leg. Repeat __50__ times per session. Do __1__ sessions per day. Repeat in dimly lit room. Repeat with eyes closed.  Copyright  VHI. All rights reserved.  Forward   Facing step, place one leg on step, flexed at hip. Step up slowly, bringing hips in line with knee and shoulder. Bring other foot onto step. Reverse process to step back down. Repeat with other leg. Do _50___ repetitions, ___1_ sets.  http://bt.exer.us/154  .   Achilles / Gastroc, Standing   Stand, right foot behind, heel on floor and turned slightly out, leg straight, forward leg bent. Move hips forward. Hold _30__ seconds. Repeat _3__ times per session. Do _3__ sessions per day.   Stretching: Soleus   Stand with right foot back, both knees bent. Keeping heel on floor, turned slightly out, lean into wall until stretch is felt in lower calf. Hold _30___ seconds. Repeat __3__ times per set. Do __3__ sessions per day.     Gastroc / Heel Cord Stretch - On Step   Stand with heels over edge of stair. Holding rail, lower heels until stretch is felt in calf of legs. Hold 30 secs.  Repeat __3_ times. Do __3_ times per day.  Copyright  VHI. All rights reserved.      Voncille Lo, PT Certified Exercise Expert for the Aging Adult  08/22/19 8:30 AM Phone: [phone] Fax: [phone]

## 2019-08-22 NOTE — Therapy (Addendum)
[place], [place], [address] Phone: [phone]   Fax:  [phone]  Physical Therapy Treatment/Discharge Note  Patient Details  Name: Kim Kim MRN: [id number] Date of Birth: Jan 10, 1956 Referring Provider (PT): Stefanie Libel MD   Encounter Date: 08/22/2019  PT End of Session - 08/22/19 0825    Visit Number  6    Number of Visits  13    Date for PT Re-Evaluation  09/12/19    Authorization Type  [employer]    PT Start Time  0800    PT Stop Time  0842    PT Time Calculation (min)  42 min    Activity Tolerance  Patient tolerated treatment well    Behavior During Therapy  [facility] for tasks assessed/performed       Past Medical History:  Diagnosis Date  . Seizures ([place])   . Thyroid disease     Past Surgical History:  Procedure Laterality Date  . none      There were no vitals filed for this visit.  Subjective Assessment - 08/22/19 0801    Subjective  I bought the tape for the knees.  I went for a massage for 90 minutes yesterday and my muscles are so knotted in the back of my legs.    Pertinent History  hashimoto dz,    How long can you walk comfortably?  I can only walk a mile because of schedule but I have done 2 miles    Patient Stated Goals  Pt wants to return to PLOF without pain    Currently in Pain?  Yes    Pain Score  2     Pain Location  Knee    Pain Orientation  Right    Pain Descriptors / Indicators  Aching;Tightness    Pain Type  Acute pain    Pain Onset  More than a month ago    Pain Frequency  Intermittent    Pain Score  0    Pain Location  Knee    Pain Orientation  Left    Pain Descriptors / Indicators  Tightness    Pain Type  Acute pain    Pain Onset  More than a month ago    Pain Frequency  Intermittent         [facility] PT Assessment - 08/22/19 0001      Assessment   Medical Diagnosis  Rt knee pain    Referring Provider (PT)  Stefanie Libel MD      Observation/Other  Assessments   Focus on Therapeutic Outcomes (FOTO)   FOTO intake 80%  limitaion 20% predicted 35%      AROM   Right Knee Extension  5    Right Knee Flexion  137    Left Knee Extension  0    Left Knee Flexion  145      Strength   Overall Strength  Deficits    Right Hip Flexion  5/5    Right Hip Extension  4/5    Right Hip ABduction  4/5    Left Hip Flexion  5/5    Left Hip Extension  4+/5    Left Hip ABduction  4-/5    Right Knee Flexion  4+/5    Right Knee Extension  5/5    Left Knee Flexion  5/5    Left Knee Extension  5/5      Palpation   Palpation comment  tenderness over RT tibial tubercle  and medial joint line/fat pad                    [facility] Adult PT Treatment/Exercise - 08/22/19 0001      Knee/Hip Exercises: Stretches   Active Hamstring Stretch  3 reps;Right;30 seconds    Active Hamstring Stretch Limitations  supine with strap    Quad Stretch Limitations  lunge step with left knee on pillow with PPT and tall kneeling with knee flexion x 10 for anterior chain dynamic stretch    ITB Stretch Limitations  ITB and adductor stretch supine with strap 3 x 30 sec each     Other Knee/Hip Stretches  slant board, heel off edge of step       Knee/Hip Exercises: Aerobic   Elliptical  L1 6 min      Knee/Hip Exercises: Standing   Lateral Step Up  Both;15 reps;Step Height: 8";1 set    Lateral Step Up Limitations  with 15 # KB    Forward Step Up  Both;15 reps;Hand Hold: 1;1 set    Forward Step Up Limitations  with 15 # KB    Step Down  2 sets;Both;15 reps;Step Height: 4"    Functional Squat  15 reps    Functional Squat Limitations  at counter without tape or wt.    Other Standing Knee Exercises  Jefferson deadlift off  4 inch step for hamstring stretch  5 x 3 with 30 # KB      Knee/Hip Exercises: Seated   Sit to Sand  15 reps;2 sets   10 KB weight     Modalities   Modalities  Ultrasound      Ultrasound   Ultrasound Location  medial RT knee    Ultrasound  Parameters  100% 1.2 w/cm2    Ultrasound Goals  Pain      Ankle Exercises: Stretches   Soleus Stretch  30 seconds;4 reps    Soleus Stretch Limitations  RT and LT VC each    Gastroc Stretch  4 reps;30 seconds    Gastroc Stretch Limitations  RT and LT VC    Slant Board Stretch  3 reps;30 seconds    Slant Board Stretch Limitations  VC  RT and LT             PT Education - 08/22/19 1049    Education Details  added standing closed chain ex to HEP    Person(s) Educated  Patient    Methods  Explanation;Demonstration;Tactile cues;Verbal cues;Handout    Comprehension  Verbalized understanding;Returned demonstration          PT Long Term Goals - 08/22/19 0819      PT LONG TERM GOAL #1   Title  Pt will be independent with advanced HEP.    Baseline  Pt advancing HEP with standing/ body wt and added wt.    Time  6    Period  Weeks    Status  Met   Target Date  09/12/19      PT LONG TERM GOAL #2   Title  Pt will be able to ascend/descend steps without exacerbating right knee pain    Baseline  Pt reports ascends /descends steps with 35% better    Time  6    Period  Weeks    Status Partially met though phone call confirmation 09-30-19    Target Date  09/12/19      PT LONG TERM GOAL #3   Title  Pt will be  able to return to exericise routine including 3 mile walks for exericise    Baseline  Pt now able to walk 2 miles , working toward goal    Time  6    Period  Weeks    Status  On-going    Target Date  09/12/19      PT LONG TERM GOAL #4   Title  Pt will improve R knee extensor/flexor strength to >/= 4+/5 with </= 2/10 pain to promote safety with walking/standing activities    Baseline  PT RT knee 5/5 but with 2/10 pain,  still having trouble with hopping without exacerbating pain    Time  6    Period  Weeks    Status  Partially Met    Target Date  09/12/19      PT LONG TERM GOAL #5   Title  FOTO will improve from 51% limitation   to 35% limitation    indicating improved  functional mobility    Baseline  limtation 20%    Time  6    Period  Weeks    Status  Achieved    Target Date  09/12/19            Plan - 08/22/19 1042    Clinical Impression Statement  Pt enters clinic with 2/10 pain in knee and is better since evaluation. FOTO limitation 20% and predicted was 35%.  Pt knee strengthen is 4+ to 5/5 but pt has pain with hopping,  Pt able to ascend and descend steps now. Pt able to advance HEP for standing/ steps, gastroc stretch.  Pt achieved or partially met  Goal # 4 and # 5.  Pt is walking without limp now but still has TTP medial joint line pain that may need to be addressed by Dr Oneida Alar.  Pt has been compliant with exericses and is now able to walk 1 -2 miles but she is still not up to level before her RT knee injury.  Will continue POC for strengthening.  Pt was educated on  Self MC connell taping for home use.  Pt RT knee AROM improved but not symmetrical with LT    Personal Factors and Comorbidities  Comorbidity 1    Comorbidities  Hashimotos dz, OA, temporal sz well controlled    Examination-Activity Limitations  Stairs;Transfers;Squat;Other    Stability/Clinical Decision Making  Stable/Uncomplicated    Clinical Decision Making  Low    Rehab Potential  Excellent    PT Frequency  2x / week    PT Duration  6 weeks    PT Treatment/Interventions  Cryotherapy;Electrical Stimulation;Iontophoresis 32m/ml Dexamethasone;Moist Heat;Ultrasound;Gait training;Stair training;Functional mobility training;Therapeutic activities;Therapeutic exercise;Neuromuscular re-education;Manual techniques;Patient/family education;Passive range of motion;Dry needling;Taping;Joint Manipulations    PT Next Visit Plan  See About DR Fields visit.  work on pain of medial knee with modalities, STW, hip strengthening    PT Home Exercise Plan  quad strength       Patient will benefit from skilled therapeutic intervention in order to improve the following deficits and impairments:   Pain, Impaired flexibility, Decreased range of motion, Decreased strength, Difficulty walking  Visit Diagnosis: Acute pain of right knee  Stiffness of right knee, not elsewhere classified  Muscle weakness (generalized)     Problem List Patient Active Problem List   Diagnosis Date Noted  . Osteoarthritis 07/17/2019  . Family history of heart disease 12/11/2018  . Atypical chest pain 12/11/2018  . B12 deficiency 10/16/2017  . Elevated BP without diagnosis of  hypertension 10/01/2017  . Osteoporosis 10/01/2017  . Uterine fibroid 07/31/2017  . History of cleft palate 11/22/2016  . 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 Normocytic anemia 07/31/2016  . Screen for colon cancer 07/31/2016  . Iron deficiency 04/27/2016  . Hypothyroidism 12/17/2012  . Insulin resistance 12/17/2012  . Auras 10/15/2012  . Hashimoto's thyroiditis 07/24/2012  . Hyperlipidemia 07/24/2012  . 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 Basal metabolism function study abnormality 03/07/2012  . Pituitary microadenoma ([place]) 01/05/2012  . Dyspnea 07/09/2011  . Pulmonary nodules 07/09/2011  . 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 Dysmetabolic syndrome X 93/79/0240  . Growth hormone deficiency ([place]) 03/28/2011  . Low HDL (under 40) 03/28/2011  . GERD (gastroesophageal reflux disease) 01/01/2009  . Obesity 05/19/2008  . Vitamin D deficiency 10/07/2007    Voncille Lo, PT Certified Exercise Expert for the Aging Adult  08/22/19 10:49 AM Phone: [phone] Fax: [facility] [address] [place], [place], [address] Phone: [phone]   Fax:  [phone]  Name: Kim Kim MRN: [id number] Date of Birth: 1956-01-07   PHYSICAL THERAPY DISCHARGE SUMMARY  Visits from Start of Care: 6  Current functional level related to goals / functional outcomes: Pt reports by phone she is better and utilizes taping taught  at PT   Remaining deficits: Negligible, No concerns   Education / Equipment: Taping, HEP Plan: Patient agrees to discharge.  Patient goals were partially met. Patient is being discharged due to the patient's request.  ?????    And is pleased with current functional level  Voncille Lo, PT Certified Exercise Expert for the Aging Adult  09/30/19 9:56 AM Phone: [phone] Fax: [phone]

## 2019-08-26 ENCOUNTER — Encounter: Payer: BC Managed Care – PPO | Admitting: Physical Therapy

## 2019-08-28 ENCOUNTER — Ambulatory Visit: Payer: BC Managed Care – PPO | Admitting: Sports Medicine

## 2019-08-29 DIAGNOSIS — E785 Hyperlipidemia, unspecified: Secondary | ICD-10-CM | POA: Diagnosis not present

## 2019-08-29 DIAGNOSIS — Z6828 Body mass index (BMI) 28.0-28.9, adult: Secondary | ICD-10-CM | POA: Diagnosis not present

## 2019-09-01 DIAGNOSIS — K253 Acute gastric ulcer without hemorrhage or perforation: Secondary | ICD-10-CM | POA: Diagnosis not present

## 2019-09-01 DIAGNOSIS — K5904 Chronic idiopathic constipation: Secondary | ICD-10-CM | POA: Diagnosis not present

## 2019-09-01 DIAGNOSIS — K219 Gastro-esophageal reflux disease without esophagitis: Secondary | ICD-10-CM | POA: Diagnosis not present

## 2019-09-01 DIAGNOSIS — K449 Diaphragmatic hernia without obstruction or gangrene: Secondary | ICD-10-CM | POA: Diagnosis not present

## 2019-09-02 ENCOUNTER — Ambulatory Visit: Payer: BC Managed Care – PPO | Admitting: Physical Therapy

## 2019-09-05 ENCOUNTER — Ambulatory Visit: Payer: BC Managed Care – PPO | Admitting: Physical Therapy

## 2019-09-05 DIAGNOSIS — E8881 Metabolic syndrome: Secondary | ICD-10-CM | POA: Diagnosis not present

## 2019-09-05 DIAGNOSIS — M810 Age-related osteoporosis without current pathological fracture: Secondary | ICD-10-CM | POA: Diagnosis not present

## 2019-09-05 DIAGNOSIS — E039 Hypothyroidism, unspecified: Secondary | ICD-10-CM | POA: Diagnosis not present

## 2019-09-09 ENCOUNTER — Ambulatory Visit: Payer: BC Managed Care – PPO | Admitting: Physical Therapy

## 2019-09-12 ENCOUNTER — Ambulatory Visit: Payer: BC Managed Care – PPO | Admitting: Physical Therapy

## 2019-09-13 DIAGNOSIS — J029 Acute pharyngitis, unspecified: Secondary | ICD-10-CM | POA: Diagnosis not present

## 2019-09-13 DIAGNOSIS — Z20828 Contact with and (suspected) exposure to other viral communicable diseases: Secondary | ICD-10-CM | POA: Diagnosis not present

## 2019-09-16 ENCOUNTER — Ambulatory Visit: Payer: BC Managed Care – PPO | Admitting: Sports Medicine

## 2019-09-23 ENCOUNTER — Ambulatory Visit (INDEPENDENT_AMBULATORY_CARE_PROVIDER_SITE_OTHER): Payer: BC Managed Care – PPO | Admitting: Sports Medicine

## 2019-09-23 ENCOUNTER — Other Ambulatory Visit: Payer: Self-pay

## 2019-09-23 DIAGNOSIS — M1711 Unilateral primary osteoarthritis, right knee: Secondary | ICD-10-CM | POA: Diagnosis not present

## 2019-09-23 NOTE — Progress Notes (Signed)
CC: RT knee pain  Patient has been through PT RT knee is much better Not running but walking more Less pain with going up or down steps  Still has pain in 2 areas at times Medial joint line Top outer portion of patella  Swelling has resolved and knee feels more stable  ROS No locking No giving way  PE Pleasant older F in NAD BP 112/80   Ht 5\' 4"  (1.626 m)   Wt 163 lb (73.9 kg)   BMI 27.98 kg/m   Knee: RT Normal to inspection with no erythema or effusion or obvious bony abnormalities. Palpation normal with no warmth or joint line tenderness or patellar tenderness or condyle tenderness. ROM normal in flexion and extension and lower leg rotation. She is now able to get > 150 deg of flexion before any pain Ligaments with solid consistent endpoints including ACL, PCL, LCL, MCL. Negative Mcmurray's and provocative meniscal tests. Non painful patellar compression. Crepitation at superior patella and some along lateral joint line. Patellar and quadriceps tendons unremarkable. Hamstring and quadriceps strength is normal.

## 2019-09-23 NOTE — Assessment & Plan Note (Signed)
Patient with mild OA of RT knee Probably most is in the PF joint Evaluated in [place]  By Dr. Koren Bound before coming to [facility]  Now feeling better after PT She bought a Peloton and with biking is getting some improvement as well  Wants to steadily increase her fitness and feels that her strength has improved but not what it was a couple of years ago  Plan: Increase the HEP to have miniquats, wall slides and step routine Try to build up time on Bike Add some walking qod  Reevaluate in 3 months

## 2019-09-23 NOTE — Patient Instructions (Addendum)
It was great to see you today!  We would like to to start the exercises we discussed at the visit today.  You were given a hand out with single leg mini squat, single leg wall slide, and 4-way step ups. Continue using your Peloton as tolerated. Walk for exercise every other day.  We would recommend you trying to establish care with Dr. Shelia Media. [facility] [address] [place] [phone]

## 2019-10-07 DIAGNOSIS — E785 Hyperlipidemia, unspecified: Secondary | ICD-10-CM | POA: Diagnosis not present

## 2019-10-07 DIAGNOSIS — Z8249 Family history of ischemic heart disease and other diseases of the circulatory system: Secondary | ICD-10-CM | POA: Diagnosis not present

## 2019-10-07 DIAGNOSIS — R569 Unspecified convulsions: Secondary | ICD-10-CM | POA: Diagnosis not present

## 2019-10-07 DIAGNOSIS — R7303 Prediabetes: Secondary | ICD-10-CM | POA: Diagnosis not present

## 2019-10-13 DIAGNOSIS — E039 Hypothyroidism, unspecified: Secondary | ICD-10-CM | POA: Diagnosis not present

## 2019-10-13 DIAGNOSIS — M899 Disorder of bone, unspecified: Secondary | ICD-10-CM | POA: Diagnosis not present

## 2019-10-13 DIAGNOSIS — D352 Benign neoplasm of pituitary gland: Secondary | ICD-10-CM | POA: Diagnosis not present

## 2019-10-13 DIAGNOSIS — E230 Hypopituitarism: Secondary | ICD-10-CM | POA: Diagnosis not present

## 2019-10-22 DIAGNOSIS — M899 Disorder of bone, unspecified: Secondary | ICD-10-CM | POA: Diagnosis not present

## 2019-10-22 DIAGNOSIS — E039 Hypothyroidism, unspecified: Secondary | ICD-10-CM | POA: Diagnosis not present

## 2019-10-23 DIAGNOSIS — R7309 Other abnormal glucose: Secondary | ICD-10-CM | POA: Diagnosis not present

## 2019-10-23 DIAGNOSIS — E162 Hypoglycemia, unspecified: Secondary | ICD-10-CM | POA: Diagnosis not present

## 2019-10-28 DIAGNOSIS — E162 Hypoglycemia, unspecified: Secondary | ICD-10-CM | POA: Diagnosis not present

## 2019-10-28 DIAGNOSIS — E063 Autoimmune thyroiditis: Secondary | ICD-10-CM | POA: Diagnosis not present

## 2019-10-28 DIAGNOSIS — E039 Hypothyroidism, unspecified: Secondary | ICD-10-CM | POA: Diagnosis not present

## 2019-10-28 DIAGNOSIS — M899 Disorder of bone, unspecified: Secondary | ICD-10-CM | POA: Diagnosis not present

## 2019-11-25 DIAGNOSIS — Z01419 Encounter for gynecological examination (general) (routine) without abnormal findings: Secondary | ICD-10-CM | POA: Diagnosis not present

## 2019-11-25 DIAGNOSIS — Z1389 Encounter for screening for other disorder: Secondary | ICD-10-CM | POA: Diagnosis not present

## 2019-11-25 DIAGNOSIS — Z130 Encounter for screening for diseases of the blood and blood-forming organs and certain disorders involving the immune mechanism: Secondary | ICD-10-CM | POA: Diagnosis not present

## 2019-11-25 DIAGNOSIS — Z6828 Body mass index (BMI) 28.0-28.9, adult: Secondary | ICD-10-CM | POA: Diagnosis not present

## 2019-11-28 DIAGNOSIS — Z6828 Body mass index (BMI) 28.0-28.9, adult: Secondary | ICD-10-CM | POA: Diagnosis not present

## 2019-11-28 DIAGNOSIS — K259 Gastric ulcer, unspecified as acute or chronic, without hemorrhage or perforation: Secondary | ICD-10-CM | POA: Diagnosis not present

## 2019-11-28 DIAGNOSIS — K449 Diaphragmatic hernia without obstruction or gangrene: Secondary | ICD-10-CM | POA: Diagnosis not present

## 2019-11-28 DIAGNOSIS — K219 Gastro-esophageal reflux disease without esophagitis: Secondary | ICD-10-CM | POA: Diagnosis not present

## 2019-12-02 ENCOUNTER — Other Ambulatory Visit: Payer: Self-pay

## 2019-12-02 ENCOUNTER — Encounter: Payer: Self-pay | Admitting: Sports Medicine

## 2019-12-02 ENCOUNTER — Ambulatory Visit (INDEPENDENT_AMBULATORY_CARE_PROVIDER_SITE_OTHER): Payer: BLUE CROSS/BLUE SHIELD | Admitting: Sports Medicine

## 2019-12-02 VITALS — BP 126/80 | Ht 64.0 in | Wt 166.0 lb

## 2019-12-02 DIAGNOSIS — M1711 Unilateral primary osteoarthritis, right knee: Secondary | ICD-10-CM

## 2019-12-02 DIAGNOSIS — L84 Corns and callosities: Secondary | ICD-10-CM

## 2019-12-02 NOTE — Progress Notes (Addendum)
   [facility] [address] [place],  [address] Phone: [phone] Fax: [phone]   Patient Name: Kim Kim Date of Birth: 07-25-1956 Medical Record Number: [id number] Gender: female Date of Encounter: 12/02/2019  SUBJECTIVE:      Chief Complaint:  Right knee follow-up and right plantar foot pain   HPI:  Patient is doing great with regards to her knee since continuing with Peloton.  She still notices some decreased flexibility, but it has improved over the last 6 months.  She is cycling regularly, and has changed exercise shoes.  She has been having right plantar heel pain.  She saw a dermatologist in [place] who was worried about a corn.  She feels she is having pain when walking, and affecting her balance.  She has never had something like this before.  She has not really tried any intervention besides purchasing heel cushion pads that have not helped.  She denies any swelling, numbness, tingling, skin changes, or erythema.  ROS  OA pain of RT knee at night has resolved Not seeing swelling No mechanical sxs     PERTINENT  PMH / PSH / FH / SH:  Past Medical, Surgical, Social, and Family History Reviewed & Updated in the EMR. Pertinent findings include:  Vitamin B12 deficiency, hypothyroidism, vitamin D deficiency, osteoporosis   OBJECTIVE:  BP 126/80   Ht 5\' 4"  (1.626 m)   Wt 166 lb (75.3 kg)   BMI 28.49 kg/m  Physical Exam:  Vital signs are reviewed.   GEN: Alert and oriented, NAD Pulm: Breathing unlabored PSY: normal mood, congruent affect  MSK: Right knee: Normal to inspection with no erythema or effusion or obvious bony abnormalities. Palpation normal with no warmth, joint line tenderness, patellar tenderness, or condyle tenderness. ROM full in flexion and extension and lower leg rotation. Ligaments with solid consistent endpoints including ACL, PCL, LCL, MCL. Negative Mcmurray's and Thessaly tests. Non painful  patellar compression. Patellar glide without crepitus. Patellar and quadriceps tendons unremarkable. Hamstring and quadriceps strength is normal.  Neurovascularly intact.  Right foot No swelling or erythema Small white flat skin eruption on plantar heel without fluctuance with deep TTP Shaving does not show capillaries Full ROM at ankle and toes Full strength at ankle and toes No TTP Negative compression test Normal transverse and medial arch NVI   ASSESSMENT & PLAN:   1. Right plantar foot pain  Attempted to shave down calluses to evaluate for plantar wart, more likely it is a hard callus and not a wart.  Instructed patient how to apply Duofilm and duct tape to the area for the next month.  We also fitted her with a heel pad, and cut out the padding around the callus.  2.  Right knee pain  Patient continues to improve.  Recommended continuing cycling to keep quad strength.  She can follow-up as needed for this.   Lanier Clam, DO, ATC Sports Medicine Fellow  I observed and examined the patient with Dr. Kathrynn Speed and agree with assessment and plan.  Note reviewed and modified by me. I spent 32 minutes with this patient. Over 50% of visit was spend in counseling and coordination of care for problems with heel and knee pain. Ila Mcgill, MD

## 2019-12-04 DIAGNOSIS — E039 Hypothyroidism, unspecified: Secondary | ICD-10-CM | POA: Diagnosis not present

## 2019-12-04 DIAGNOSIS — K449 Diaphragmatic hernia without obstruction or gangrene: Secondary | ICD-10-CM | POA: Diagnosis not present

## 2019-12-04 DIAGNOSIS — E559 Vitamin D deficiency, unspecified: Secondary | ICD-10-CM | POA: Diagnosis not present

## 2019-12-04 DIAGNOSIS — I6529 Occlusion and stenosis of unspecified carotid artery: Secondary | ICD-10-CM | POA: Diagnosis not present

## 2019-12-04 DIAGNOSIS — G40109 Localization-related (focal) (partial) symptomatic epilepsy and epileptic syndromes with simple partial seizures, not intractable, without status epilepticus: Secondary | ICD-10-CM | POA: Diagnosis not present

## 2019-12-05 DIAGNOSIS — R7303 Prediabetes: Secondary | ICD-10-CM | POA: Diagnosis not present

## 2019-12-09 ENCOUNTER — Encounter: Payer: Self-pay | Admitting: *Deleted

## 2019-12-10 ENCOUNTER — Ambulatory Visit (INDEPENDENT_AMBULATORY_CARE_PROVIDER_SITE_OTHER): Payer: BC Managed Care – PPO | Admitting: Diagnostic Neuroimaging

## 2019-12-10 ENCOUNTER — Encounter: Payer: Self-pay | Admitting: Diagnostic Neuroimaging

## 2019-12-10 ENCOUNTER — Other Ambulatory Visit: Payer: Self-pay

## 2019-12-10 VITALS — BP 131/88 | HR 83 | Temp 96.8°F | Ht 64.0 in | Wt 166.0 lb

## 2019-12-10 DIAGNOSIS — G40109 Localization-related (focal) (partial) symptomatic epilepsy and epileptic syndromes with simple partial seizures, not intractable, without status epilepticus: Secondary | ICD-10-CM

## 2019-12-10 MED ORDER — LAMOTRIGINE 100 MG PO TABS: 100.0000 mg | ORAL_TABLET | Freq: Two times a day (BID) | ORAL | 4 refills | Status: DC

## 2019-12-10 NOTE — Progress Notes (Signed)
[facility]  PATIENT: Kim Kim DOB: 04-Oct-1956  REFERRING CLINICIAN: Ronnald Nian, DO HISTORY FROM: patient  REASON FOR VISIT: new consult    HISTORICAL  CHIEF COMPLAINT:  Chief Complaint  Patient presents with  . Seizures    rm 7 New Pt "temporal lobe seizures"    HISTORY OF PRESENT ILLNESS:   64 year old female here for evaluation of temporal procedures.  In patient's 62s or 39s she was having recurrent dream, feeling like she was a little girl a swing in an old house.  Patient was having intermittent episodes of this strange feeling in her head which she had a hard time describing.  Sometimes she would feel lightheadedness and faint.  In her 28s she had an episode at home where she almost passed out.  In 2007 she had an episode where apparently she turned pale in color and almost fainted.  No dj vu sensations.  She had 1 out of body sensation in high school.  In 2013 patient went to see a neurologist, had EEG which showed some left temporal lobe dysfunction but no clear epileptiform discharges.  Patient was started on topiramate but this caused personality changes and side effects.  She saw another neurologist who started patient on lamotrigine and this seemed to help suppress these episodes.  Patient did well for many years.  She had gone from having 2-3 episodes a month down to no episodes.  In 2020 patient moved from [place] to [place] and was under increased stress during Covid pandemic and relocation.  In the last few months she has noticed some mild strain sensations in her head which remind her of her prior spells.  She feels a lightheadedness and strangeness in her head.  No convulsions or loss of consciousness.  Patient has been stable on lamotrigine 70 5 in the morning and 100 at night.  Now patient having approximately 2-3 episodes per week.  Episodes can last a few minutes at a time.  No prior head trauma, meningitis or  encephalitis.  No family history of seizures.   REVIEW OF SYSTEMS: Full 14 system review of systems performed and negative with exception of: As per HPI.  ALLERGIES: No Known Allergies  HOME MEDICATIONS: Outpatient Medications Prior to Visit  Medication Sig Dispense Refill  . alendronate (FOSAMAX) 70 MG tablet Take 70 mg by mouth once a week.    . desonide (DESOWEN) 0.05 % cream APPLY TO AFFECTED AREA TWICE A DAY    . diclofenac sodium (VOLTAREN) 1 % GEL Apply topically.    . fluticasone (FLONASE) 50 MCG/ACT nasal spray Place 1 spray into both nostrils as needed.     . Lactobacillus Acidophilus POWD by Does not apply route.    . lamoTRIgine (LAMICTAL) 25 MG tablet Take 25 mg by mouth daily.    Marland Kitchen levothyroxine (SYNTHROID) 50 MCG tablet Take 50 mcg by mouth daily before breakfast.    . rosuvastatin (CRESTOR) 10 MG tablet Take 10 mg by mouth at bedtime.    Marland Kitchen loteprednol (LOTEMAX) 0.5 % ophthalmic suspension Place 1 drop into both eyes 4 (four) times daily.     . rosuvastatin (CRESTOR) 20 MG tablet Take 20 mg by mouth daily.     No facility-administered medications prior to visit.    PAST MEDICAL HISTORY: Past Medical History:  Diagnosis Date  . Carotid atherosclerosis   . Gastric ulcer    hx of  . GERD (gastroesophageal reflux disease)   . Growth hormone deficiency (  [place])   . Hiatal hernia   . Hiatal hernia   . Hypothyroidism    hx of Hashimoto thyroiditis  . Pituitary adenoma ([place])    hx of  . Seizures ([place])    temporal lobe  . Thyroid disease   . Vitamin D deficiency     PAST SURGICAL HISTORY: Past Surgical History:  Procedure Laterality Date  . none      FAMILY HISTORY: Family History  Problem Relation Age of Onset  . Heart disease Mother   . Diabetes Mother   . Thyroid disease Mother        hypo  . Heart disease Father   . Hypertension Father     SOCIAL HISTORY: Social History   Socioeconomic History  . Marital status: Married    Spouse name: Gene    . Number of children: 3  . Years of education: Not on file  . Highest education level: Associate degree: occupational, Hotel manager, or vocational program  Occupational History    Comment: NA  Tobacco Use  . Smoking status: Never Smoker  . Smokeless tobacco: Never Used  Substance and Sexual Activity  . Alcohol use: Never  . Drug use: Never  . Sexual activity: Yes    Birth control/protection: Post-menopausal  Other Topics Concern  . Not on file  Social History Narrative   She recently moved from [place]. 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   Caffeine- coffee maybe 1 c, some tea   Social Determinants of Health   Financial Resource Strain:   . Difficulty of Paying Living Expenses: Not on file  Food Insecurity:   . Worried About Charity fundraiser in the Last Year: Not on file  . Ran Out of Food in the Last Year: Not on file  Transportation Needs:   . Lack of Transportation (Medical): Not on file  . Lack of Transportation (Non-Medical): Not on file  Physical Activity:   . Days of Exercise per Week: Not on file  . Minutes of Exercise per Session: Not on file  Stress:   . Feeling of Stress : Not on file  Social Connections:   . Frequency of Communication with Friends and Family: Not on file  . Frequency of Social Gatherings with Friends and Family: Not on file  . Attends Religious Services: Not on file  . Active Member of Clubs or Organizations: Not on file  . Attends Archivist Meetings: Not on file  . Marital Status: Not on file  Intimate Partner Violence:   . Fear of Current or Ex-Partner: Not on file  . Emotionally Abused: Not on file  . Physically Abused: Not on file  . Sexually Abused: Not on file     PHYSICAL EXAM  GENERAL EXAM/CONSTITUTIONAL: Vitals:  Vitals:   12/10/19 0820  BP: 131/88  Pulse: 83  Temp: (!) 96.8 F (36 C)  Weight: 166 lb (75.3 kg)  Height: 5\' 4"  (1.626 m)     Body mass index is 28.49 kg/m. Wt  Readings from Last 3 Encounters:  12/10/19 166 lb (75.3 kg)  12/02/19 166 lb (75.3 kg)  09/23/19 163 lb (73.9 kg)     Patient is in no distress; well developed, nourished and groomed; neck is supple  CARDIOVASCULAR:  Examination of carotid arteries is normal; no carotid bruits  Regular rate and rhythm, no murmurs  Examination of peripheral vascular system by observation and palpation is normal  EYES:  Ophthalmoscopic  exam of optic discs and posterior segments is normal; no papilledema or hemorrhages  No exam data present  MUSCULOSKELETAL:  Gait, strength, tone, movements noted in Neurologic exam below  NEUROLOGIC: MENTAL STATUS:  No flowsheet data found.  awake, alert, oriented to person, place and time  recent and remote memory intact  normal attention and concentration  language fluent, comprehension intact, naming intact  fund of knowledge appropriate  CRANIAL NERVE:   2nd - no papilledema on fundoscopic exam  2nd, 3rd, 4th, 6th - pupils equal and reactive to light, visual fields full to confrontation, extraocular muscles intact, no nystagmus  5th - facial sensation symmetric  7th - facial strength symmetric  8th - hearing intact  9th - palate elevates symmetrically, uvula midline  11th - shoulder shrug symmetric  12th - tongue protrusion midline  MOTOR:   normal bulk and tone, full strength in the BUE, BLE  SENSORY:   normal and symmetric to light touch, temperature, vibration  COORDINATION:   finger-nose-finger, fine finger movements normal  REFLEXES:   deep tendon reflexes present and symmetric  GAIT/STATION:   narrow based gait     DIAGNOSTIC DATA (LABS, IMAGING, TESTING) - I reviewed patient records, labs, notes, testing and imaging myself where available.  No results found for: WBC, HGB, HCT, MCV, PLT No results found for: NA, K, CL, CO2, GLUCOSE, BUN, CREATININE, CALCIUM, PROT, ALBUMIN, AST, ALT, ALKPHOS, BILITOT,  GFRNONAA, GFRAA No results found for: CHOL, HDL, LDLCALC, LDLDIRECT, TRIG, CHOLHDL No results found for: HGBA1C No results found for: VITAMINB12 No results found for: TSH   2013 MRI brain  -Unremarkable brain -Small 1 mm focus of hypoenhancement may represent microadenoma  2013 EEG -Left anterior temporal lobe dysfunction; no epileptiform discharges  2013 Long-term EEG -7-hour video EEG; low voltage occipital spikes possibly epileptiform -No clinical or electrographic seizures    ASSESSMENT AND PLAN  64 y.o. year old female here with intermittent spells of lightheadedness, strange feeling in head, diagnosed with possible temporal lobe seizures in 2013.  Patient has been stable on lamotrigine, but now with slightly increased spells in the last few months.  We will increase dose to lamotrigine 100 mg twice a day.  Dx:  1. 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H[facility])     PLAN:  TEMPORAL LOBE SEIZURES / SPELLS - increase lamotrigine 100mg  twice a day  - annual CBC, CMP per PCP  Meds ordered this encounter  Medications  . lamoTRIgine (LAMICTAL) 100 MG tablet    Sig: Take 1 tablet (100 mg total) by mouth 2 (two) times daily.    Dispense:  180 tablet    Refill:  4   Return in about 6 months (around 06/08/2020).  I spent 60 minutes of face-to-face and non-face-to-face time with patient.  This included previsit chart review, lab review, study review, order entry, electronic health record documentation, patient education.     Penni Bombard, MD [id number], 0000000 AM Certified in Neurology, Neurophysiology and Neuroimaging  [facility] [address], [place], [place] [address] [phone]

## 2019-12-12 ENCOUNTER — Telehealth: Payer: Self-pay | Admitting: Diagnostic Neuroimaging

## 2019-12-12 NOTE — Telephone Encounter (Signed)
Pt states that her lamoTRIgine (LAMICTAL) 100 MG tablet has to be resent in to the pharmacy because it needs to be the Brand name that has to be filled of the medication not the Generic. Please advise.

## 2019-12-15 ENCOUNTER — Other Ambulatory Visit: Payer: Self-pay | Admitting: Neurology

## 2019-12-15 ENCOUNTER — Telehealth: Payer: Self-pay | Admitting: Neurology

## 2019-12-15 MED ORDER — LAMICTAL 100 MG PO TABS: 100.0000 mg | ORAL_TABLET | Freq: Two times a day (BID) | ORAL | 3 refills | Status: DC

## 2019-12-15 NOTE — Telephone Encounter (Signed)
Refill sent into the pharmacy for brand name for the patient.

## 2019-12-15 NOTE — Telephone Encounter (Signed)
Completed PA for the brand name medication lamicatal through cover my meds/[employer]. [id number]  Will wait for response. Can take up to 3 days

## 2019-12-15 NOTE — Telephone Encounter (Signed)
Mary @ [facility] has called with pt on line, pt is down to 2 dosages of the lamoTRIgine (LAMICTAL) 100 MG tablet .  Stanton Kidney said she is sending an urgent request for a PA.  Stanton Kidney has provided [phone] for a verbal p.a. to be called in.  Pt has also asked for a call from [place], [place] once the PA has been submitted for her

## 2019-12-15 NOTE — Telephone Encounter (Signed)
Called the patient advised the patient that the PA was approved from the insurance for brand name. Advised her that she can reach out to the pharmacy and they should be able to process as well. Advised I would also forward a fax to the pharmacy advising the PA is approved. Pt verbalized understanding.

## 2019-12-15 NOTE — Telephone Encounter (Signed)
PA approved for the pt Effective from 12/15/2019 through 12/13/2022.

## 2019-12-17 NOTE — Telephone Encounter (Signed)
Patient called to check if a PA for her lamictal could be tried with her [employer] insurance and would like a CB from RN to clarify further information on how she can get her medication states she will call her insurance to check on the lamictal PA that has already been completed.

## 2019-12-17 NOTE — Telephone Encounter (Signed)
Pt has called asking that RN Myriam Jacobson calls her again [id number] and her insurance

## 2019-12-17 NOTE — Telephone Encounter (Signed)
Pt has called asking for a call back from RN to discuss that there must be a disconnect somewhere because she is still being told she has to pay $2500 for the medication, meanwhile she is currently without her medication.  Pt states [phone] needs to be called re: a form that needs completing for DAW brand name  Of medication, pt said the insurance should be her [employer] secondary insurance. Please call

## 2019-12-17 NOTE — Telephone Encounter (Signed)
I have called the pharmacy and they are saying they see where its approved but when they process the med it's coming up 2500$. I called the provider line on the back of the insurance card where the prior auth was completed and they state they see where the medication is approved but they dont see where a claim has even been processed. Pt may be confused on which card was given to the pharmacy. I completed the PA for the medication under the [employer] with subscriber [phone]. This card has a [employer] # on it. This insurance was approved for the brand name.  The patient's other insurance card does not have [place] or anything related to prescription coverage. Patient is contacting the pharmacy to make sure they are submitting through the correct insurance.  Patient had advised that a form would be coming to our office to complete. As of this moment I do not see a form on fax machine's up front, nothing in [place]'s bin and nothing on [facility] desk that has been sent over. I advised the patient I would look for it, but I don't think there should be anything left for our office to complete.

## 2019-12-17 NOTE — Telephone Encounter (Signed)
Called the patient back she states that [facility] both cards and it is still coming up 2000$. Advised the patient I am not sure what else that I can do from my standpoint. I advised that I processed through the correct rx insurance and it was approved. Advised that I have not received any other forms to complete. Informed her it may be worth reaching out to her pharmacy to see if there is a out of pocket expense that is having to be met. If there is something else needed, gave the patient my direct fax number where they can fax a form if needed. Patient will contact the insurance company.

## 2019-12-18 NOTE — Telephone Encounter (Signed)
Called the patient because she reached out on mychart asking for a call. Once I called the patient she states that I needed to call

## 2019-12-18 NOTE — Telephone Encounter (Signed)
The secondary insurance which was [facility] [employer]. Their number is [phone]  PT ID is [id number] RX BIN: [id number] [facility] [employer]- [phone]  I called the insurance to complete the PA over the phone and they completed a override on the prior auth for the patient for brand name Lamictal. PA was approved over the phone and patient was in on the call at the time the approval was completed.  PA ref# is [id number] And this PA is good until 12/17/2022.   Pt will contact pharmacy to see if it will now process for her.

## 2019-12-30 ENCOUNTER — Ambulatory Visit (INDEPENDENT_AMBULATORY_CARE_PROVIDER_SITE_OTHER): Payer: BC Managed Care – PPO | Admitting: Diagnostic Neuroimaging

## 2019-12-30 ENCOUNTER — Other Ambulatory Visit: Payer: Self-pay

## 2019-12-30 ENCOUNTER — Encounter: Payer: Self-pay | Admitting: Diagnostic Neuroimaging

## 2019-12-30 VITALS — BP 132/83 | HR 80 | Temp 96.8°F | Ht 64.0 in | Wt 167.0 lb

## 2019-12-30 DIAGNOSIS — G40109 Localization-related (focal) (partial) symptomatic epilepsy and epileptic syndromes with simple partial seizures, not intractable, without status epilepticus: Secondary | ICD-10-CM

## 2019-12-30 NOTE — Patient Instructions (Signed)
TEMPORAL LOBE SEIZURES / SPELLS - continue lamictal 100mg  twice a day (brand medically necessary) - annual CBC, CMP per PCP  MEMORY LOSS - improve exercise, nutrition, sleep, stress mgmt

## 2019-12-30 NOTE — Progress Notes (Signed)
[facility]  PATIENT: Kim Kim DOB: 04/10/1956  REFERRING CLINICIAN: Ronnald Nian, DO HISTORY FROM: patient  REASON FOR VISIT: follow up   HISTORICAL  CHIEF COMPLAINT:  Chief Complaint  Patient presents with  . 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 Some reports of memory loss. Some stress. Also asking about intermittent eye pressure issues when laying face down with massages. No alleviating or aggravating factors. Tolerating lamictal 100mg  twice a day. Mild spells have resolved.    PRIOR HPI: 64 year old female here for evaluation of temporal procedures.  In patient's 73s or 67s she was having recurrent dream, feeling like she was a little girl a swing in an old house.  Patient was having intermittent episodes of this strange feeling in her head which she had a hard time describing.  Sometimes she would feel lightheadedness and faint.  In her 81s she had an episode at home where she almost passed out.  In 2007 she had an episode where apparently she turned pale in color and almost fainted.  No dj vu sensations.  She had 1 out of body sensation in high school.  In 2013 patient went to see a neurologist, had EEG which showed some left temporal lobe dysfunction but no clear epileptiform discharges.  Patient was started on topiramate but this caused personality changes and side effects.  She saw another neurologist who started patient on lamotrigine and this seemed to help suppress these episodes.  Patient did well for many years.  She had gone from having 2-3 episodes a month down to no episodes.  In 2020 patient moved from [place] to [place] and was under increased stress during Covid pandemic and relocation.  In the last few months she has noticed some mild  strain sensations in her head which remind her of her prior spells.  She feels a lightheadedness and strangeness in her head.  No convulsions or loss of consciousness.  Patient has been stable on lamotrigine 75 in the morning and 100 at night.  Now patient having approximately 2-3 episodes per week.  Episodes can last a few minutes at a time.  No prior head trauma, meningitis or encephalitis.  No family history of seizures.   REVIEW OF SYSTEMS: Full 14 system review of systems performed and negative with exception of: As per HPI.  ALLERGIES: No Known Allergies  HOME MEDICATIONS: Outpatient Medications Prior to Visit  Medication Sig Dispense Refill  . alendronate (FOSAMAX) 70 MG tablet Take 70 mg by mouth once a week.    . desonide (DESOWEN) 0.05 % cream APPLY TO AFFECTED AREA TWICE A DAY    . diclofenac sodium (VOLTAREN) 1 % GEL Apply topically.    . fluticasone (FLONASE) 50 MCG/ACT nasal spray Place 1 spray into both nostrils as needed.     . Lactobacillus Acidophilus POWD by Does not apply route.    Marland Kitchen LAMICTAL 100 MG tablet Take 1 tablet (100 mg total) by mouth 2 (two) times daily. 180 tablet 3  . levothyroxine (SYNTHROID) 50 MCG tablet Take 50 mcg by mouth daily before breakfast.    . rosuvastatin (CRESTOR) 10 MG tablet Take 10 mg by mouth at bedtime.     No facility-administered medications prior to visit.    PAST MEDICAL HISTORY:  Past Medical History:  Diagnosis Date  . Carotid atherosclerosis   . Gastric ulcer    hx of  . GERD (gastroesophageal reflux disease)   . Growth hormone deficiency ([place])   . Hiatal hernia   . Hiatal hernia   . Hypothyroidism    hx of Hashimoto thyroiditis  . Pituitary adenoma ([place])    hx of  . Seizures ([place])    temporal lobe  . Thyroid disease   . Vitamin D deficiency     PAST SURGICAL HISTORY: Past Surgical History:  Procedure Laterality Date  . none      FAMILY HISTORY: Family History  Problem Relation Age of Onset  . Heart  disease Mother   . Diabetes Mother   . Thyroid disease Mother        hypo  . Heart disease Father   . Hypertension Father     SOCIAL HISTORY: Social History   Socioeconomic History  . Marital status: Married    Spouse name: Gene  . Number of children: 3  . Years of education: Not on file  . Highest education level: Associate degree: occupational, Hotel manager, or vocational program  Occupational History    Comment: NA  Tobacco Use  . Smoking status: Never Smoker  . Smokeless tobacco: Never Used  Substance and Sexual Activity  . Alcohol use: Never  . Drug use: Never  . Sexual activity: Yes    Birth control/protection: Post-menopausal  Other Topics Concern  . Not on file  Social History Narrative   She recently moved from [place]. 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   Caffeine- coffee maybe 1 c, some tea   Social Determinants of Health   Financial Resource Strain:   . Difficulty of Paying Living Expenses: Not on file  Food Insecurity:   . Worried About Charity fundraiser in the Last Year: Not on file  . Ran Out of Food in the Last Year: Not on file  Transportation Needs:   . Lack of Transportation (Medical): Not on file  . Lack of Transportation (Non-Medical): Not on file  Physical Activity:   . Days of Exercise per Week: Not on file  . Minutes of Exercise per Session: Not on file  Stress:   . Feeling of Stress : Not on file  Social Connections:   . Frequency of Communication with Friends and Family: Not on file  . Frequency of Social Gatherings with Friends and Family: Not on file  . Attends Religious Services: Not on file  . Active Member of Clubs or Organizations: Not on file  . Attends Archivist Meetings: Not on file  . Marital Status: Not on file  Intimate Partner Violence:   . Fear of Current or Ex-Partner: Not on file  . Emotionally Abused: Not on file  . Physically Abused: Not on file  . Sexually Abused: Not on  file     PHYSICAL EXAM  GENERAL EXAM/CONSTITUTIONAL: Vitals:  Vitals:   12/30/19 0911  BP: 132/83  Pulse: 80  Temp: (!) 96.8 F (36 C)  Weight: 167 lb (75.8 kg)  Height: 5\' 4"  (1.626 m)   Body mass index is 28.67 kg/m. Wt Readings from Last 3 Encounters:  12/30/19 167 lb (75.8 kg)  12/10/19 166 lb (75.3 kg)  12/02/19 166 lb (75.3 kg)    Patient is in no distress; well developed, nourished and groomed; neck is supple  CARDIOVASCULAR:  Examination of carotid arteries  is normal; no carotid bruits  Regular rate and rhythm, no murmurs  Examination of peripheral vascular system by observation and palpation is normal  EYES:  Ophthalmoscopic exam of optic discs and posterior segments is normal; no papilledema or hemorrhages  Hearing Screening   125Hz  250Hz  500Hz  1000Hz  2000Hz  3000Hz  4000Hz  6000Hz  8000Hz   Right ear:           Left ear:             Visual Acuity Screening   Right eye Left eye Both eyes  Without correction: 20/30 20/30   With correction:       MUSCULOSKELETAL:  Gait, strength, tone, movements noted in Neurologic exam below  NEUROLOGIC: MENTAL STATUS:  No flowsheet data found.  awake, alert, oriented to person, place and time  recent and remote memory intact  normal attention and concentration  language fluent, comprehension intact, naming intact  fund of knowledge appropriate  CRANIAL NERVE:   2nd - no papilledema on fundoscopic exam  2nd, 3rd, 4th, 6th - pupils equal and reactive to light, visual fields full to confrontation, extraocular muscles intact, no nystagmus  5th - facial sensation symmetric  7th - facial strength symmetric  8th - hearing intact  9th - palate elevates symmetrically, uvula midline  11th - shoulder shrug symmetric  12th - tongue protrusion midline  MOTOR:   normal bulk and tone, full strength in the BUE, BLE  SENSORY:   normal and symmetric to light touch, temperature, vibration  COORDINATION:    finger-nose-finger, fine finger movements normal  REFLEXES:   deep tendon reflexes present and symmetric  GAIT/STATION:   narrow based gait     DIAGNOSTIC DATA (LABS, IMAGING, TESTING) - I reviewed patient records, labs, notes, testing and imaging myself where available.  No results found for: WBC, HGB, HCT, MCV, PLT No results found for: NA, K, CL, CO2, GLUCOSE, BUN, CREATININE, CALCIUM, PROT, ALBUMIN, AST, ALT, ALKPHOS, BILITOT, GFRNONAA, GFRAA No results found for: CHOL, HDL, LDLCALC, LDLDIRECT, TRIG, CHOLHDL No results found for: HGBA1C No results found for: VITAMINB12 No results found for: TSH   2013 MRI brain  -Unremarkable brain -Small 1 mm focus of hypoenhancement may represent microadenoma  2013 EEG -Left anterior temporal lobe dysfunction; no epileptiform discharges  2013 Long-term EEG -7-hour video EEG; low voltage occipital spikes possibly epileptiform -No clinical or electrographic seizures    ASSESSMENT AND PLAN  64 y.o. year old female here with intermittent spells of lightheadedness, strange feeling in head, diagnosed with possible temporal lobe seizures in 2013.  Patient has been stable on lamotrigine, but now with slightly increased spells in the last few months.  We will increase dose to lamotrigine 100 mg twice a day.  Dx:  1. 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[place])     PLAN:  TEMPORAL LOBE SEIZURES / SPELLS - continue lamictal 100mg  twice a day (brand medically necessary) - annual CBC, CMP per PCP  MEMORY LOSS (mild subjective; no change in ADLs) - improve exercise, nutrition, sleep, stress mgmt  Return in about 1 year (around 12/29/2020).    Penni Bombard, MD [id number], [id number] AM Certified in Neurology, Neurophysiology and Neuroimaging  [facility] [address], [place], [place] [address] [phone]

## 2020-01-01 DIAGNOSIS — L720 Epidermal cyst: Secondary | ICD-10-CM | POA: Diagnosis not present

## 2020-01-01 DIAGNOSIS — L308 Other specified dermatitis: Secondary | ICD-10-CM | POA: Diagnosis not present

## 2020-01-01 DIAGNOSIS — L821 Other seborrheic keratosis: Secondary | ICD-10-CM | POA: Diagnosis not present

## 2020-01-03 ENCOUNTER — Ambulatory Visit: Payer: BC Managed Care – PPO | Attending: Internal Medicine

## 2020-01-03 DIAGNOSIS — Z23 Encounter for immunization: Secondary | ICD-10-CM

## 2020-01-03 NOTE — Progress Notes (Signed)
   Covid-19 Vaccination Clinic  Name:  Kim Kim    MRN: [id number] DOB: 1955/11/13  01/03/2020  [name] was observed post Covid-19 immunization for 15 minutes without incident. She was provided with Vaccine Information Sheet and instruction to access the V-Safe system.   [name] was instructed to call 911 with any severe reactions post vaccine: Marland Kitchen Difficulty breathing  . Swelling of face and throat  . A fast heartbeat  . A bad rash all over body  . Dizziness and weakness   Immunizations Administered    Name Date Dose VIS Date Route   Pfizer COVID-19 Vaccine 01/03/2020 12:41 PM 0.3 mL 10/10/2019 Intramuscular   Manufacturer: [place]   Lot: [id number]   [place]: [id number]

## 2020-01-06 ENCOUNTER — Telehealth: Payer: Self-pay | Admitting: Family Medicine

## 2020-01-06 ENCOUNTER — Other Ambulatory Visit: Payer: Self-pay

## 2020-01-06 NOTE — Telephone Encounter (Signed)
Patient is calling and stated that she had discuss a referral to the thyroid doctor. Patient would like a referral to a thyroid provider. Please call patient at [phone].

## 2020-01-06 NOTE — Telephone Encounter (Signed)
Pt is scheduled for tomorrow 

## 2020-01-07 ENCOUNTER — Ambulatory Visit (INDEPENDENT_AMBULATORY_CARE_PROVIDER_SITE_OTHER): Payer: BC Managed Care – PPO | Admitting: Family Medicine

## 2020-01-07 ENCOUNTER — Encounter: Payer: Self-pay | Admitting: Family Medicine

## 2020-01-07 VITALS — BP 110/88 | HR 81 | Temp 97.1°F | Ht 64.0 in | Wt 171.0 lb

## 2020-01-07 DIAGNOSIS — E559 Vitamin D deficiency, unspecified: Secondary | ICD-10-CM | POA: Diagnosis not present

## 2020-01-07 DIAGNOSIS — R5383 Other fatigue: Secondary | ICD-10-CM

## 2020-01-07 DIAGNOSIS — E782 Mixed hyperlipidemia: Secondary | ICD-10-CM | POA: Diagnosis not present

## 2020-01-07 DIAGNOSIS — E038 Other specified hypothyroidism: Secondary | ICD-10-CM

## 2020-01-07 DIAGNOSIS — E8881 Metabolic syndrome: Secondary | ICD-10-CM | POA: Diagnosis not present

## 2020-01-07 DIAGNOSIS — M8580 Other specified disorders of bone density and structure, unspecified site: Secondary | ICD-10-CM | POA: Diagnosis not present

## 2020-01-07 DIAGNOSIS — E063 Autoimmune thyroiditis: Secondary | ICD-10-CM

## 2020-01-07 LAB — CBC
HCT: 37.6 % (ref 36.0–46.0)
Hemoglobin: 12.5 g/dL (ref 12.0–15.0)
MCHC: 33.2 g/dL (ref 30.0–36.0)
MCV: 82.2 fl (ref 78.0–100.0)
Platelets: 250 10*3/uL (ref 150.0–400.0)
RBC: 4.57 Mil/uL (ref 3.87–5.11)
RDW: 14.5 % (ref 11.5–15.5)
WBC: 7 10*3/uL (ref 4.0–10.5)

## 2020-01-07 LAB — TSH: TSH: 2.11 u[IU]/mL (ref 0.35–4.50)

## 2020-01-07 LAB — POTASSIUM: Potassium: 4.4 mEq/L (ref 3.5–5.1)

## 2020-01-07 LAB — VITAMIN D 25 HYDROXY (VIT D DEFICIENCY, FRACTURES): VITD: 34.95 ng/mL (ref 30.00–100.00)

## 2020-01-07 LAB — MAGNESIUM: Magnesium: 2.1 mg/dL (ref 1.5–2.5)

## 2020-01-07 LAB — T4, FREE: Free T4: 1.08 ng/dL (ref 0.60–1.60)

## 2020-01-07 LAB — VITAMIN B12: Vitamin B-12: 340 pg/mL (ref 211–911)

## 2020-01-07 LAB — HEMOGLOBIN A1C: Hgb A1c MFr Bld: 5.8 % (ref 4.6–6.5)

## 2020-01-07 NOTE — Patient Instructions (Addendum)
Vit D 1000IU-2000IU daily Calcium 1200mg -1500mg  daily

## 2020-01-07 NOTE — Progress Notes (Signed)
Kim Kim is a 64 y.o. female  Chief Complaint  Patient presents with  . Fatigue    Pt c/o tired worsening in the last few days.  Pt is fasting for labs.  Pt would like to discuss Fasomax    HPI: Kim Kim is a 64 y.o. female here to discuss a few issues: 1. She complains of increased fatigue, worse in the past few days. She does have a h/o hypothyroidism and is on levothyroxine 37mcg daily. Last TSH in 08/2019 = 1.98. Pt states endo in [place] wanted pts TSH around 1.0. Pt requests endo referral.  2. She is Rx'd fosamax for osteopenia but is not currently taking it. She also stopped taking Vit D supplement. Last Dexa 08/2019 - osteopenia. 3. She has been getting B12 injections - last injection around 11/2018. No oral supplement. 4. A1C = 5.8 in 08/2019. 5. She was diagnosed via EGD with hiatal hernia and gastric ulcer. She is on protonix and this has been effective at managing symptoms.  She had labs in 08/2019 in [place] - available in [place].   Depression screen PHQ 2/9 01/07/2020  Decreased Interest 0  Down, Depressed, Hopeless 0  PHQ - 2 Score 0     Past Medical History:  Diagnosis Date  . Carotid atherosclerosis   . Gastric ulcer    hx of  . GERD (gastroesophageal reflux disease)   . Growth hormone deficiency ([place])   . Hiatal hernia   . Hiatal hernia   . Hypothyroidism    hx of Hashimoto thyroiditis  . Pituitary adenoma ([place])    hx of  . 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 Thyroid disease   . Vitamin D deficiency     Past Surgical History:  Procedure Laterality Date  . none      Social History   Socioeconomic History  . Marital status: Married    Spouse name: Gene  . Number of children: 3  . Years of education: Not on file  . Highest education level: Associate degree: occupational, Hotel manager, or vocational program  Occupational History    Comment: NA  Tobacco Use  . Smoking status: Never Smoker  . Smokeless tobacco: Never Used    Substance and Sexual Activity  . Alcohol use: Never  . Drug use: Never  . Sexual activity: Yes    Birth control/protection: Post-menopausal  Other Topics Concern  . Not on file  Social History Narrative   She recently moved from [place]. 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   Caffeine- coffee maybe 1 c, some tea   Social Determinants of Health   Financial Resource Strain:   . Difficulty of Paying Living Expenses: Not on file  Food Insecurity:   . Worried About Charity fundraiser in the Last Year: Not on file  . Ran Out of Food in the Last Year: Not on file  Transportation Needs:   . Lack of Transportation (Medical): Not on file  . Lack of Transportation (Non-Medical): Not on file  Physical Activity:   . Days of Exercise per Week: Not on file  . Minutes of Exercise per Session: Not on file  Stress:   . Feeling of Stress : Not on file  Social Connections:   . Frequency of Communication with Friends and Family: Not on file  . Frequency of Social Gatherings with Friends and Family: Not on file  . Attends Religious Services: Not on  file  . Active Member of Clubs or Organizations: Not on file  . Attends Archivist Meetings: Not on file  . Marital Status: Not on file  Intimate Partner Violence:   . Fear of Current or Ex-Partner: Not on file  . Emotionally Abused: Not on file  . Physically Abused: Not on file  . Sexually Abused: Not on file    Family History  Problem Relation Age of Onset  . Heart disease Mother   . Diabetes Mother   . Thyroid disease Mother        hypo  . Heart disease Father   . Hypertension Father      Immunization History  Administered Date(s) Administered  . PFIZER SARS-COV-2 Vaccination 01/03/2020    Outpatient Encounter Medications as of 01/07/2020  Medication Sig Note  . desonide (DESOWEN) 0.05 % cream APPLY TO AFFECTED AREA TWICE A DAY   . diclofenac sodium (VOLTAREN) 1 % GEL Apply topically.  12/10/2019: As needed  . fluticasone (FLONASE) 50 MCG/ACT nasal spray Place 1 spray into both nostrils as needed.    . Lactobacillus Acidophilus POWD by Does not apply route. 12/10/2019: Capsules, for hiatal hernia, 1 in am  . LAMICTAL 100 MG tablet Take 1 tablet (100 mg total) by mouth 2 (two) times daily.   Marland Kitchen levothyroxine (SYNTHROID) 50 MCG tablet Take 50 mcg by mouth daily before breakfast.   . rosuvastatin (CRESTOR) 10 MG tablet Take 10 mg by mouth at bedtime.   Marland Kitchen alendronate (FOSAMAX) 70 MG tablet Take 70 mg by mouth once a week.   . pantoprazole (PROTONIX) 40 MG tablet Take 40 mg by mouth every morning.    No facility-administered encounter medications on file as of 01/07/2020.     ROS: Pertinent positives and negatives noted in HPI. Remainder of ROS non-contributory    No Known Allergies  BP 110/88 (BP Location: Left Arm, Patient Position: Sitting, Cuff Size: Normal)   Pulse 81   Temp (!) 97.1 F (36.2 C) (Temporal)   Ht 5\' 4"  (1.626 m)   Wt 171 lb (77.6 kg)   SpO2 98%   BMI 29.35 kg/m   Physical Exam  Constitutional: She is oriented to person, place, and time. She appears well-developed and well-nourished. No distress.  Neck: No thyromegaly present.  Cardiovascular: Normal rate, regular rhythm, normal heart sounds and intact distal pulses.  Pulmonary/Chest: Effort normal and breath sounds normal. No respiratory distress. She has no wheezes.  Musculoskeletal:        General: No edema.     Cervical back: Neck supple.  Lymphadenopathy:    She has no cervical adenopathy.  Neurological: She is alert and oriented to person, place, and time.  Psychiatric: She has a normal mood and affect. Her behavior is normal.     A/P:  1. Fatigue, unspecified type - last TSH 1.9 in 08/2019 - TSH - T4, free - Vitamin B12 - Iron, TIBC and Ferritin Panel - CBC - Hemoglobin A1c - Magnesium  2. Hypothyroidism due to Hashimoto's thyroiditis - levothyroxine 27mcg daily - TSH -  T4, free - referral to endocrinology   3. Osteopenia 4. Vitamin D deficiency - recommend daily calcium and Vit D - VITAMIN D 25 Hydroxy (Vit-D Deficiency, Fractures)  5. Mixed hyperlipidemia - on crestor 10mg  daily but pt thinks she may get hip pain d/t med - FLP today and will discuss med with pt once result available  6. Insulin resistance - Hemoglobin A1c  This visit occurred  during the SARS-CoV-2 public health emergency.  Safety protocols were in place, including screening questions prior to the visit, additional usage of staff PPE, and extensive cleaning of exam room while observing appropriate contact time as indicated for disinfecting solutions.

## 2020-01-08 ENCOUNTER — Encounter: Payer: Self-pay | Admitting: Family Medicine

## 2020-01-08 LAB — IRON,TIBC AND FERRITIN PANEL
%SAT: 24 % (calc) (ref 16–45)
Ferritin: 388 ng/mL — ABNORMAL HIGH (ref 16–288)
Iron: 81 ug/dL (ref 45–160)
TIBC: 336 mcg/dL (calc) (ref 250–450)

## 2020-01-09 ENCOUNTER — Encounter: Payer: Self-pay | Admitting: Family Medicine

## 2020-01-09 ENCOUNTER — Other Ambulatory Visit: Payer: Self-pay | Admitting: Family Medicine

## 2020-01-09 DIAGNOSIS — R7989 Other specified abnormal findings of blood chemistry: Secondary | ICD-10-CM

## 2020-01-09 NOTE — Telephone Encounter (Signed)
Patient is requesting a call back regarding message on mychart. CB is [phone].

## 2020-01-20 ENCOUNTER — Other Ambulatory Visit: Payer: Self-pay

## 2020-01-21 ENCOUNTER — Ambulatory Visit (INDEPENDENT_AMBULATORY_CARE_PROVIDER_SITE_OTHER): Payer: BC Managed Care – PPO | Admitting: Internal Medicine

## 2020-01-21 ENCOUNTER — Encounter: Payer: Self-pay | Admitting: Internal Medicine

## 2020-01-21 VITALS — BP 114/78 | HR 83 | Temp 97.9°F | Ht 64.0 in | Wt 167.0 lb

## 2020-01-21 DIAGNOSIS — R7303 Prediabetes: Secondary | ICD-10-CM

## 2020-01-21 DIAGNOSIS — R635 Abnormal weight gain: Secondary | ICD-10-CM

## 2020-01-21 DIAGNOSIS — M8580 Other specified disorders of bone density and structure, unspecified site: Secondary | ICD-10-CM

## 2020-01-21 DIAGNOSIS — E063 Autoimmune thyroiditis: Secondary | ICD-10-CM | POA: Diagnosis not present

## 2020-01-21 NOTE — Patient Instructions (Addendum)
-   Vitamin D3 1000 daily  - Calcium Citrate 600 TWICE daily    You are on levothyroxine - which is your thyroid hormone supplement. You MUST take this consistently.  You should take this first thing in the morning on an empty stomach with water. You should not take it with other medications. Wait 60min to 1hr prior to eating. If you are taking any vitamins - please take these in the evening.   If you miss a dose, please take your missed dose the following day (double the dose for that day). You should have a pill box for ONLY levothyroxine on your bedside table to help you remember to take your medications.

## 2020-01-21 NOTE — Progress Notes (Signed)
Name: Kim Kim  MRN/ DOB: [id number], Jan 11, 1956    Age/ Sex: 64 y.o., female    PCP: Ronnald Nian, DO   Reason for Endocrinology Evaluation:      Date of Initial Endocrinology Evaluation: 01/21/2020     HPI: [name] is a 64 y.o. female with a past medical history of hypothyroidism and hiatal hernia. The patient presented for initial endocrinology clinic visit on 01/21/2020 for consultative assistance with her hypothyroidism.    Patient moved from [place], which she is to see an endocrinologist there. She has been  diagnosed with hypothyroidism secondary to Hashimoto's thyroiditis since her 43's. She has been on LT-4 replacement ever since her diagnosis  She also has been diagnosed with multiple thyroid nodules based on a thyroid ultrasound from 2009.  She had an FNA of the 3 cm right nodule and 1 cm left nodule, the results are not available.  But repeat ultrasound in 2012 showed stability  When she saw her PCP and early March 2021 she was having severe fatigue and restlessness and just did not feel good overall.  Her TSH was at 2.11 uIU/mL with a free T4 of 1.08 ng/dL on 01/07/2020.  Despite normal TFTs patient increase her levothyroxine by an extra half a tablet on Sundays.  Per patient her previous endocrinologist wanted her TSH to be around 1.0 uIU/mL .  Patient noted a dramatic improvement by the next day.   She has been sleeping well at night, she denies snoring, no constipation or diarrhea. No anxiety or depression   No biotin use     She is on 1.5 tabs on Sunday, but takes one tablet daily   Osteopenia history: She has been diagnosed with osteopenia in 2008.  Per endocrinology note she has been on Fosamax since 2014.  But by the time she moved here she has been off the Fosamax.  Her last DEXA in 08/2019 showed stable hip BMD as well as spine.  Prediabetes: Per endocrinology note in care everywhere she has been off metformin since April/2020.  Patient  tells me she was on Glyxambi at some point.   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  With an IGF-1 of 74. She was on Nutropin for approximately a year, but this was discontinued and her symptoms have been stable.  MRI stable from 2016.      HISTORY:  Past Medical History:  Past Medical History:  Diagnosis Date  . Carotid atherosclerosis   . Gastric ulcer    hx of  . GERD (gastroesophageal reflux disease)   . Growth hormone deficiency ([place])   . Hiatal hernia   . Hiatal hernia   . Hypothyroidism    hx of Hashimoto thyroiditis  . Pituitary adenoma ([place])    hx of  . 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 Thyroid disease   . Vitamin D deficiency    Past Surgical History:  Past Surgical History:  Procedure Laterality Date  . none        Social History:  reports that she has never smoked. She has never used smokeless tobacco. She reports that she does not drink alcohol or use drugs.  Family History: family history includes Diabetes in her mother; Heart disease in her father and mother; Hypertension in her father; Thyroid disease in her mother.   HOME MEDICATIONS: Allergies as of 01/21/2020   No Known Allergies     Medication List  Accurate as of January 21, 2020  1:36 PM. If you have any questions, ask your nurse or doctor.        desonide 0.05 % cream Commonly known as: DESOWEN APPLY TO AFFECTED AREA TWICE A DAY   diclofenac sodium 1 % Gel Commonly known as: VOLTAREN Apply topically.   fluticasone 50 MCG/ACT nasal spray Commonly known as: FLONASE Place 1 spray into both nostrils as needed.   Lactobacillus Acidophilus Powd by Does not apply route.   LaMICtal 100 MG tablet Generic drug: lamoTRIgine Take 1 tablet (100 mg total) by mouth 2 (two) times daily.   pantoprazole 40 MG tablet Commonly known as: PROTONIX Take 40 mg by mouth every morning.   rosuvastatin 10 MG tablet Commonly known as:  CRESTOR Take 10 mg by mouth at bedtime.   Synthroid 50 MCG tablet Generic drug: levothyroxine Take 50 mcg by mouth daily before breakfast.         REVIEW OF SYSTEMS: A comprehensive ROS was conducted with the patient and is negative except as per HPI and below:  ROS     OBJECTIVE:  VS: BP 114/78 (BP Location: Left Arm, Patient Position: Sitting, Cuff Size: Large)   Pulse 83   Temp 97.9 F (36.6 C)   Ht 5\' 4"  (1.626 m)   Wt 167 lb (75.8 kg)   SpO2 98%   BMI 28.67 kg/m    Wt Readings from Last 3 Encounters:  01/21/20 167 lb (75.8 kg)  01/07/20 171 lb (77.6 kg)  12/30/19 167 lb (75.8 kg)     EXAM: General: Pt appears well and is in NAD  Neck: General: Supple without adenopathy. Thyroid: Thyroid size normal.  No goiter or nodules appreciated. No thyroid bruit.  Lungs: Clear with good BS bilat with no rales, rhonchi, or wheezes  Heart: Auscultation: RRR.  Abdomen: Normoactive bowel sounds, soft, nontender, without masses or organomegaly palpable  Extremities:  BL LE: No pretibial edema normal ROM and strength.  Skin: Hair: Texture and amount normal with gender appropriate distribution Skin Inspection: No rashes Skin Palpation: Skin temperature, texture, and thickness normal to palpation  Neuro: Cranial nerves: II - XII grossly intact  Motor: Normal strength throughout DTRs: 2+ and symmetric in UE without delay in relaxation phase  Mental Status: Judgment, insight: Intact Orientation: Oriented to time, place, and person Mood and affect: No depression, anxiety, or agitation     DATA REVIEWED:   Results for Kim Kim, Kim Kim (MRN [id number]) as of 01/21/2020 14:11  Ref. Range 01/07/2020 11:19  Hemoglobin A1C Latest Ref Range: 4.6 - 6.5 % 5.8  TSH Latest Ref Range: 0.35 - 4.50 uIU/mL 2.11  T4,Free(Direct) Latest Ref Range: 0.60 - 1.60 ng/dL 1.08    ASSESSMENT/PLAN/RECOMMENDATIONS:   1. Hypothyroidism secondary to Hashimoto's thyroiditis:  -Patient had symptoms of  fatigue while on levothyroxine 50 MCG daily, despite normal TFTs a few weeks ago, she did increase her levothyroxine to 1 and a1/2 tablets on Sunday and continued with 1 tablet the rest of the week, patient states that she felt immediately better the next day. -I did caution the patient against  hyperthyroidism due to excessive LT-for replacement intake, and its risk of worsening bone health and increasing the risk of cardiac arrhythmias. -I have asked her in the meantime to continue on the current dose of levothyroxine and we will check on the next visit -We also teased the idea of liothyronine if needed in the future   Medications : Levothyroxine 50 MCG daily except  Sundays take 1.5 tabs  2. Osteopenia:  -Patient has been encouraged to take calcium citrate, as well as vitamin D daily. -She she tells me she had a prescription of Fosamax through her previous endocrinologist but she has not started that.  But in review of care everywhere records her endocrinologist had stated that she has been on Fosamax for approximately 6 years x 2020. -I have recommended to hold off on Fosamax at this time   3.  Weight gain:  -Patient is concerned about weight gain, she does admit to decreased at exercise at some point, but she has been using the Peloton lately.  Patient does state that she has been eating  "Clean" but I explained to the patient that it is very important to focus on the calorie content in the carbohydrate grade content of the foods and not just the source. -I also have provided her with information to the  weight loss clinic with Dr. Dennard Nip  4.  Prediabetes:  -A1c 5.8% -Per care everywhere records she was on metformin until April 2020.  Previous endocrinologist. -Patient also stated she was on Glyxambi, but I cannot find a record of this -We discussed the literature results of lifestyle changes having a better impact than long-term Metformin use, and I would strongly recommend that  she continues with lifestyle changes at this point.  5.  Pituitary microadenoma: -This was not addressed during her visit due to lack of time -Per records stable on MRI from 2016 -History of IGF 1 deficiency, was on treatment for approximately a year but no further treatment was offered to the patient due to stability.   Follow-up in 3 months   Signed electronically by: Mack Guise, MD  [facility] Endocrinology  [facility] [address] [place], [place] [address] Phone: [phone] FAX: [phone]   CC: Ronnald Nian, [place] [address] Phone: [phone] Fax: [phone]   Return to Endocrinology clinic as below: Future Appointments  Date Time Provider [place]  01/24/2020  8:45 AM [place] [facility]  01/04/2021  9:30 AM [place], Earlean Polka, MD [facility] None

## 2020-01-22 ENCOUNTER — Encounter: Payer: Self-pay | Admitting: Internal Medicine

## 2020-01-22 ENCOUNTER — Ambulatory Visit: Payer: BC Managed Care – PPO | Admitting: Internal Medicine

## 2020-01-22 DIAGNOSIS — M8580 Other specified disorders of bone density and structure, unspecified site: Secondary | ICD-10-CM | POA: Insufficient documentation

## 2020-01-22 DIAGNOSIS — R7303 Prediabetes: Secondary | ICD-10-CM | POA: Insufficient documentation

## 2020-01-22 DIAGNOSIS — R635 Abnormal weight gain: Secondary | ICD-10-CM | POA: Insufficient documentation

## 2020-01-23 DIAGNOSIS — R350 Frequency of micturition: Secondary | ICD-10-CM | POA: Diagnosis not present

## 2020-01-24 ENCOUNTER — Ambulatory Visit: Payer: BC Managed Care – PPO | Attending: Internal Medicine

## 2020-01-24 DIAGNOSIS — Z23 Encounter for immunization: Secondary | ICD-10-CM

## 2020-01-24 NOTE — Progress Notes (Signed)
   Covid-19 Vaccination Clinic  Name:  Kim Kim    MRN: [id number] DOB: July 21, 1956  01/24/2020  [name] was observed post Covid-19 immunization for 15 minutes without incident. She was provided with Vaccine Information Sheet and instruction to access the V-Safe system.   [name] was instructed to call 911 with any severe reactions post vaccine: Marland Kitchen Difficulty breathing  . Swelling of face and throat  . A fast heartbeat  . A bad rash all over body  . Dizziness and weakness   Immunizations Administered    Name Date Dose VIS Date Route   Pfizer COVID-19 Vaccine 01/24/2020  8:51 AM 0.3 mL 10/10/2019 Intramuscular   Manufacturer: [place]   Lot: [id number]   [place]: [id number]

## 2020-02-03 DIAGNOSIS — K219 Gastro-esophageal reflux disease without esophagitis: Secondary | ICD-10-CM | POA: Diagnosis not present

## 2020-02-03 DIAGNOSIS — D352 Benign neoplasm of pituitary gland: Secondary | ICD-10-CM | POA: Diagnosis not present

## 2020-02-03 DIAGNOSIS — E039 Hypothyroidism, unspecified: Secondary | ICD-10-CM | POA: Diagnosis not present

## 2020-02-03 DIAGNOSIS — M25561 Pain in right knee: Secondary | ICD-10-CM | POA: Diagnosis not present

## 2020-02-03 DIAGNOSIS — E063 Autoimmune thyroiditis: Secondary | ICD-10-CM | POA: Diagnosis not present

## 2020-02-03 DIAGNOSIS — E785 Hyperlipidemia, unspecified: Secondary | ICD-10-CM | POA: Diagnosis not present

## 2020-02-03 DIAGNOSIS — M899 Disorder of bone, unspecified: Secondary | ICD-10-CM | POA: Diagnosis not present

## 2020-02-03 DIAGNOSIS — M791 Myalgia, unspecified site: Secondary | ICD-10-CM | POA: Diagnosis not present

## 2020-02-11 DIAGNOSIS — M79606 Pain in leg, unspecified: Secondary | ICD-10-CM | POA: Diagnosis not present

## 2020-02-16 ENCOUNTER — Ambulatory Visit: Payer: BC Managed Care – PPO | Admitting: Internal Medicine

## 2020-02-17 ENCOUNTER — Telehealth: Payer: Self-pay | Admitting: Family Medicine

## 2020-02-17 NOTE — Telephone Encounter (Signed)
I spoke with Kim Kim and she informed me that she would like to start the B12 injections before any repeat lab work.  I let Kim Kim know Dr. Loletha Grayer will be in office tomorrow and that I will send her message and call Kim Kim back.  I need to find out if Kim Kim will receive B12 injections monthly or weekly.  Please advise.

## 2020-02-17 NOTE — Telephone Encounter (Signed)
Patient is calling and is asking if she can go ahead and schedule her B12 injections. Pls advise. CB is [phone]

## 2020-02-17 NOTE — Telephone Encounter (Signed)
Pt can start b12 injections. She needs RN appt. She will receive 1057mcg weekly x 4 wks then monthly x 70mo

## 2020-02-18 NOTE — Telephone Encounter (Signed)
Pt scheduled for 1st B12 injection next week.

## 2020-02-23 ENCOUNTER — Other Ambulatory Visit: Payer: Self-pay

## 2020-02-24 ENCOUNTER — Telehealth: Payer: Self-pay | Admitting: Family Medicine

## 2020-02-24 ENCOUNTER — Ambulatory Visit (INDEPENDENT_AMBULATORY_CARE_PROVIDER_SITE_OTHER): Payer: BC Managed Care – PPO

## 2020-02-24 DIAGNOSIS — E538 Deficiency of other specified B group vitamins: Secondary | ICD-10-CM

## 2020-02-24 MED ORDER — CYANOCOBALAMIN 1000 MCG/ML IJ SOLN
1000.0000 ug | Freq: Once | INTRAMUSCULAR | Status: AC
  Administered 2020-02-24: 1000 ug via INTRAMUSCULAR

## 2020-02-24 NOTE — Telephone Encounter (Signed)
Pt came into the office today to get 1st B12 injection. Advise the pt that Dr. Loletha Grayer wants to do 1051mcg weekly x 4 wks then monthly x 58mo.   Pt was wondering why Dr. Loletha Grayer doing weekly because she used to do monthly or every other week in the past? When does she need to repeat B12 lab? Pt was hoping a call from Dr. Loletha Grayer but understand if Dr. Loletha Grayer does have time.

## 2020-02-24 NOTE — Progress Notes (Signed)
Pt came into the office to get 1st Weekly B12 injection. Gave injection into the right arm, pt tolerated injection well. Scheduled 2nd weekly B12 on 03/03/2020.  B12 injection weekly for 4 weeks then B12 injection Monthly for 5 months

## 2020-02-24 NOTE — Progress Notes (Signed)
Medical screening examination/treatment/procedure(s) were performed by the LPN. As primary care provider I was immediately available for consulation/collaboration. I agree with above documentation. Candise Crabtree, AGNP-C 

## 2020-02-25 NOTE — Telephone Encounter (Signed)
There is no set/standard frequency when starting/restarting B12 injections. Typically, we will start with more frequent dosing every week, or every other, for a month and then change to monthly. This gives an initial boost in b12 levels and the a slower, continued supplementation/repleation.  Lab Results  Component Value Date   VITAMINB12 340 01/07/2020

## 2020-02-26 NOTE — Telephone Encounter (Signed)
I spoke with pt and informed her of feedback from Dr.C below.  Pt verbalized understanding.

## 2020-03-01 DIAGNOSIS — L578 Other skin changes due to chronic exposure to nonionizing radiation: Secondary | ICD-10-CM | POA: Diagnosis not present

## 2020-03-01 DIAGNOSIS — L720 Epidermal cyst: Secondary | ICD-10-CM | POA: Diagnosis not present

## 2020-03-01 DIAGNOSIS — L84 Corns and callosities: Secondary | ICD-10-CM | POA: Diagnosis not present

## 2020-03-02 ENCOUNTER — Other Ambulatory Visit: Payer: Self-pay

## 2020-03-03 ENCOUNTER — Ambulatory Visit (INDEPENDENT_AMBULATORY_CARE_PROVIDER_SITE_OTHER): Payer: BC Managed Care – PPO

## 2020-03-03 DIAGNOSIS — E538 Deficiency of other specified B group vitamins: Secondary | ICD-10-CM | POA: Diagnosis not present

## 2020-03-03 MED ORDER — CYANOCOBALAMIN 1000 MCG/ML IJ SOLN
1000.0000 ug | Freq: Once | INTRAMUSCULAR | Status: AC
  Administered 2020-03-03: 1000 ug via INTRAMUSCULAR

## 2020-03-03 NOTE — Progress Notes (Signed)
I reviewed and agree with the documentation and plan as outlined below.   

## 2020-03-03 NOTE — Patient Instructions (Signed)
Please schedule next nurse visit for 3rd B-12 inj in right deltoid :)

## 2020-03-03 NOTE — Progress Notes (Signed)
After obtaining consent, and per orders of Dr. Bryan Lemma, injection of B-12 given in left deltoid by Rishabh Rinkenberger Berneta Sages. Patient instructed to remain in clinic for 20 minutes afterwards, and to report any adverse reaction to me immediately.

## 2020-03-09 ENCOUNTER — Other Ambulatory Visit: Payer: Self-pay

## 2020-03-10 ENCOUNTER — Ambulatory Visit (INDEPENDENT_AMBULATORY_CARE_PROVIDER_SITE_OTHER): Payer: BC Managed Care – PPO | Admitting: Behavioral Health

## 2020-03-10 DIAGNOSIS — E538 Deficiency of other specified B group vitamins: Secondary | ICD-10-CM

## 2020-03-10 MED ORDER — CYANOCOBALAMIN 1000 MCG/ML IJ SOLN
1000.0000 ug | Freq: Once | INTRAMUSCULAR | Status: AC
  Administered 2020-03-10: 1000 ug via INTRAMUSCULAR

## 2020-03-10 NOTE — Progress Notes (Signed)
I reviewed and agree with the documentation and plan as outlined below.   

## 2020-03-10 NOTE — Progress Notes (Signed)
Patient presents in clinic today for #3 weekly B12 injection. IM injection was given in the right deltoid. Patient tolerated it well. No signs or symptoms of a reaction were noted prior to patient leaving the nurse visit. Next appointment has been scheduled for 03/17/20 at 9 AM.

## 2020-03-17 ENCOUNTER — Ambulatory Visit: Payer: BC Managed Care – PPO

## 2020-03-18 ENCOUNTER — Other Ambulatory Visit: Payer: Self-pay

## 2020-03-18 ENCOUNTER — Ambulatory Visit (INDEPENDENT_AMBULATORY_CARE_PROVIDER_SITE_OTHER): Payer: BC Managed Care – PPO | Admitting: Behavioral Health

## 2020-03-18 DIAGNOSIS — E538 Deficiency of other specified B group vitamins: Secondary | ICD-10-CM

## 2020-03-18 MED ORDER — CYANOCOBALAMIN 1000 MCG/ML IJ SOLN
1000.0000 ug | Freq: Once | INTRAMUSCULAR | Status: AC
  Administered 2020-03-18: 1000 ug via INTRAMUSCULAR

## 2020-03-18 NOTE — Progress Notes (Signed)
I reviewed and agree with the documentation and plan as outlined below.   

## 2020-03-18 NOTE — Progress Notes (Signed)
Patient presents in clinic today for weekly B12 injection, #4. IM injection was given in the left deltoid. Patient tolerated it well. No signs or symptoms of a reaction were noted prior to patient leaving the nurse visit. Next appointment has been scheduled for 04/14/20 at 9:20 AM.

## 2020-03-22 ENCOUNTER — Ambulatory Visit
Admission: RE | Admit: 2020-03-22 | Discharge: 2020-03-22 | Disposition: A | Discharge status: Home / Self Care | Payer: BC Managed Care – PPO | Source: Ambulatory Visit | Attending: Family Medicine | Admitting: Family Medicine

## 2020-03-22 ENCOUNTER — Encounter: Payer: Self-pay | Admitting: Family Medicine

## 2020-03-22 ENCOUNTER — Other Ambulatory Visit: Payer: Self-pay

## 2020-03-22 ENCOUNTER — Ambulatory Visit (INDEPENDENT_AMBULATORY_CARE_PROVIDER_SITE_OTHER): Payer: BC Managed Care – PPO | Admitting: Family Medicine

## 2020-03-22 VITALS — BP 124/88 | Ht 64.0 in | Wt 167.0 lb

## 2020-03-22 DIAGNOSIS — M25531 Pain in right wrist: Secondary | ICD-10-CM | POA: Diagnosis not present

## 2020-03-22 NOTE — Progress Notes (Signed)
PCP: Ronnald Nian, DO  Subjective:   HPI: Patient is a 64 y.o. female here for right wrist pain.  Patient reports a few months ago she started to get pain in ulnar aspect of right wrist. Started when she started using Peloton at home cycling. Does not have ulnar wrist resting on bar - using regular handlebars. Pain on volar aspect ulnar side of wrist. Associated swelling but no numbness or tingling. Tried tylenol without much benefit. No acute injury. No pain up the arm.  Past Medical History:  Diagnosis Date  . Carotid atherosclerosis   . Gastric ulcer    hx of  . GERD (gastroesophageal reflux disease)   . Growth hormone deficiency ([place])   . Hiatal hernia   . Hiatal hernia   . Hypothyroidism    hx of Hashimoto thyroiditis  . Pituitary adenoma ([place])    hx of  . 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 Thyroid disease   . Vitamin D deficiency     Current Outpatient Medications on File Prior to Visit  Medication Sig Dispense Refill  . desonide (DESOWEN) 0.05 % cream APPLY TO AFFECTED AREA TWICE A DAY    . diclofenac sodium (VOLTAREN) 1 % GEL Apply topically.    . fluticasone (FLONASE) 50 MCG/ACT nasal spray Place 1 spray into both nostrils as needed.     . Lactobacillus Acidophilus POWD by Does not apply route.    Marland Kitchen LAMICTAL 100 MG tablet Take 1 tablet (100 mg total) by mouth 2 (two) times daily. 180 tablet 3  . levothyroxine (SYNTHROID) 50 MCG tablet Take 50 mcg by mouth daily before breakfast.    . pantoprazole (PROTONIX) 40 MG tablet Take 40 mg by mouth every morning.    . metFORMIN (GLUCOPHAGE-XR) 750 MG 24 hr tablet Take 750 mg by mouth every morning.    . rosuvastatin (CRESTOR) 10 MG tablet Take 10 mg by mouth at bedtime.     No current facility-administered medications on file prior to visit.    Past Surgical History:  Procedure Laterality Date  . none      No Known Allergies  Social History   Socioeconomic History  . Marital status: Married    Spouse  name: Gene  . Number of children: 3  . Years of education: Not on file  . Highest education level: Associate degree: occupational, Hotel manager, or vocational program  Occupational History    Comment: NA  Tobacco Use  . Smoking status: Never Smoker  . Smokeless tobacco: Never Used  Substance and Sexual Activity  . Alcohol use: Never  . Drug use: Never  . Sexual activity: Yes    Birth control/protection: Post-menopausal  Other Topics Concern  . Not on file  Social History Narrative   She recently moved from [place]. 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   Caffeine- coffee maybe 1 c, some tea   Social Determinants of Health   Financial Resource Strain:   . Difficulty of Paying Living Expenses:   Food Insecurity:   . Worried About Charity fundraiser in the Last Year:   . Arboriculturist in the Last Year:   Transportation Needs:   . Film/video editor (Medical):   Marland Kitchen Lack of Transportation (Non-Medical):   Physical Activity:   . Days of Exercise per Week:   . Minutes of Exercise per Session:   Stress:   . Feeling of Stress :  Social Connections:   . Frequency of Communication with Friends and Family:   . Frequency of Social Gatherings with Friends and Family:   . Attends Religious Services:   . Active Member of Clubs or Organizations:   . Attends Archivist Meetings:   Marland Kitchen Marital Status:   Intimate Partner Violence:   . Fear of Current or Ex-Partner:   . Emotionally Abused:   Marland Kitchen Physically Abused:   . Sexually Abused:     Family History  Problem Relation Age of Onset  . Heart disease Mother   . Diabetes Mother   . Thyroid disease Mother        hypo  . Heart disease Father   . Hypertension Father     BP 124/88   Ht 5\' 4"  (1.626 m)   Wt 167 lb (75.8 kg)   BMI 28.67 kg/m   Review of Systems: See HPI above.     Objective:  Physical Exam:  Gen: NAD, comfortable in exam room  Right wrist/hand: No deformity, bruising.   Mild swelling over pisiform. FROM with 5/5 strength. Tenderness to palpation over pisiform and flexor carpi ulnaris tendon distally.  No carpal tunnel, 1st dorsal compartment, other tenderness. NVI distally.   Limited MSK u/s right wrist: Significant neovascularity in flexor carpi ulnaris tendon distally.  No tenosynovitis.  Questionable cortical irregularity of pisiform.  Assessment & Plan:  1. Right wrist pain - consistent with severe flexor carpi ulnaris tendinopathy.  Independently reviewed radiographs and ultrasound - she has an irregularity of pisiform but denies known injury.  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  Will refer for custom ulnar gutter splint.  Icing, aleve.  F/u in 2 weeks for reevaluation.

## 2020-03-22 NOTE — Patient Instructions (Signed)
Get x-rays after you leave today - we will call you with the results. You have severe flexor carpi ulnaris tendinopathy. Icing 15 minutes at a time 3-4 times a day. Wear wrist brace as often as possible to rest this tendon. We will refer you to hand therapy for a custom brace for this that will be more comfortable. Aleve 2 tabs twice a day with food - make sure you're taking your PPI twice a day. Follow up with me tentatively in 2 weeks. I would avoid putting your hands on the handlebars of a bike or your peloton if you use it.

## 2020-04-02 DIAGNOSIS — L720 Epidermal cyst: Secondary | ICD-10-CM | POA: Diagnosis not present

## 2020-04-02 DIAGNOSIS — L84 Corns and callosities: Secondary | ICD-10-CM | POA: Diagnosis not present

## 2020-04-05 ENCOUNTER — Ambulatory Visit (INDEPENDENT_AMBULATORY_CARE_PROVIDER_SITE_OTHER): Payer: BC Managed Care – PPO | Admitting: Family Medicine

## 2020-04-05 ENCOUNTER — Other Ambulatory Visit: Payer: Self-pay

## 2020-04-05 ENCOUNTER — Encounter: Payer: Self-pay | Admitting: Family Medicine

## 2020-04-05 VITALS — BP 112/78 | Ht 64.0 in | Wt 165.0 lb

## 2020-04-05 DIAGNOSIS — M25531 Pain in right wrist: Secondary | ICD-10-CM | POA: Diagnosis not present

## 2020-04-05 NOTE — Progress Notes (Signed)
PCP: Ronnald Nian, DO  Subjective:   HPI: Patient is a 64 y.o. female here for right wrist pain.  5/24: Patient reports a few months ago she started to get pain in ulnar aspect of right wrist. Started when she started using Peloton at home cycling. Does not have ulnar wrist resting on bar - using regular handlebars. Pain on volar aspect ulnar side of wrist. Associated swelling but no numbness or tingling. Tried tylenol without much benefit. No acute injury. No pain up the arm.  6/7: Patient reports she feels better compared to last visit. She tried the ulnar gutter splint but it never felt comfortable. Has been modifying activities and saw massage therapist and pain has improved. Both wrists feel about the same now with pain on ulnar side. No new injuries.  Past Medical History:  Diagnosis Date  . Carotid atherosclerosis   . Gastric ulcer    hx of  . GERD (gastroesophageal reflux disease)   . Growth hormone deficiency ([place])   . Hiatal hernia   . Hiatal hernia   . Hypothyroidism    hx of Hashimoto thyroiditis  . Pituitary adenoma ([place])    hx of  . Seizures ([place])    temporal lobe  . Thyroid disease   . Vitamin D deficiency     Current Outpatient Medications on File Prior to Visit  Medication Sig Dispense Refill  . desonide (DESOWEN) 0.05 % cream APPLY TO AFFECTED AREA TWICE A DAY    . diclofenac sodium (VOLTAREN) 1 % GEL Apply topically.    . fluticasone (FLONASE) 50 MCG/ACT nasal spray Place 1 spray into both nostrils as needed.     . Lactobacillus Acidophilus POWD by Does not apply route.    Marland Kitchen LAMICTAL 100 MG tablet Take 1 tablet (100 mg total) by mouth 2 (two) times daily. 180 tablet 3  . levothyroxine (SYNTHROID) 50 MCG tablet Take 50 mcg by mouth daily before breakfast.    . metFORMIN (GLUCOPHAGE-XR) 750 MG 24 hr tablet Take 750 mg by mouth every morning.    . pantoprazole (PROTONIX) 40 MG tablet Take 40 mg by mouth every morning.    . rosuvastatin  (CRESTOR) 10 MG tablet Take 10 mg by mouth at bedtime.     No current facility-administered medications on file prior to visit.    Past Surgical History:  Procedure Laterality Date  . none      No Known Allergies  Social History   Socioeconomic History  . Marital status: Married    Spouse name: Gene  . Number of children: 3  . Years of education: Not on file  . Highest education level: Associate degree: occupational, Hotel manager, or vocational program  Occupational History    Comment: NA  Tobacco Use  . Smoking status: Never Smoker  . Smokeless tobacco: Never Used  Substance and Sexual Activity  . Alcohol use: Never  . Drug use: Never  . Sexual activity: Yes    Birth control/protection: Post-menopausal  Other Topics Concern  . Not on file  Social History Narrative   She recently moved from [place]. 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   Caffeine- coffee maybe 1 c, some tea   Social Determinants of Health   Financial Resource Strain:   . Difficulty of Paying Living Expenses:   Food Insecurity:   . Worried About Charity fundraiser in the Last Year:   . [place] in the Last  Year:   Transportation Needs:   . Film/video editor (Medical):   Marland Kitchen Lack of Transportation (Non-Medical):   Physical Activity:   . Days of Exercise per Week:   . Minutes of Exercise per Session:   Stress:   . Feeling of Stress :   Social Connections:   . Frequency of Communication with Friends and Family:   . Frequency of Social Gatherings with Friends and Family:   . Attends Religious Services:   . Active Member of Clubs or Organizations:   . Attends Archivist Meetings:   Marland Kitchen Marital Status:   Intimate Partner Violence:   . Fear of Current or Ex-Partner:   . Emotionally Abused:   Marland Kitchen Physically Abused:   . Sexually Abused:     Family History  Problem Relation Age of Onset  . Heart disease Mother   . Diabetes Mother   . Thyroid disease  Mother        hypo  . Heart disease Father   . Hypertension Father     BP 112/78   Ht 5\' 4"  (1.626 m)   Wt 165 lb (74.8 kg)   BMI 28.32 kg/m   Review of Systems: See HPI above.     Objective:  Physical Exam:  Gen: NAD, comfortable in exam room  Right wrist/hand: No deformity, swelling, bruising. FROM with 5/5 strength. Mild TTP over pisiform and distal flexor carpi ulnaris tendon.  No carpal tunnel, 1st dorsal compartment, other tenderness.  Limited MSK u/s right wrist:  Mild neovascularity distal flexor carpi ulnaris tendon distally.  No tenosynovitis.  Small cortical irregularity of pisiform at tendon insertion.  Assessment & Plan:  1. Right wrist pain - 2/2 flexor carpi ulnaris tendinopathy.  Much improved clinically and by ultrasound.  Start occupational therapy.  Icing.  F/u in 4 weeks for reevaluation.  Did not tolerate custom ulnar gutter brace.  Aleve only if needed.

## 2020-04-05 NOTE — Patient Instructions (Signed)
The flexor carpi ulnaris tendinitis is improving and looks better on ultrasound. Start occupational/hand therapy. Icing 15 minutes at a time as needed. You don't have to use the braces as these are not comfortable. Follow up with me in 4 weeks for reevaluation.  I think aquatic therapy would be beneficial for your knee and strengthening in general - there are a couple options in the area for this.

## 2020-04-12 ENCOUNTER — Other Ambulatory Visit: Payer: Self-pay

## 2020-04-13 ENCOUNTER — Ambulatory Visit (INDEPENDENT_AMBULATORY_CARE_PROVIDER_SITE_OTHER): Payer: BC Managed Care – PPO

## 2020-04-13 DIAGNOSIS — E538 Deficiency of other specified B group vitamins: Secondary | ICD-10-CM | POA: Diagnosis not present

## 2020-04-13 MED ORDER — CYANOCOBALAMIN 1000 MCG/ML IJ SOLN
1000.0000 ug | Freq: Once | INTRAMUSCULAR | Status: AC
  Administered 2020-04-13: 1000 ug via INTRAMUSCULAR

## 2020-04-13 NOTE — Progress Notes (Signed)
After obtaining consent, and per orders of Dr. Bryan Lemma, injection of B-12 given in right deltoid by Inetta Dicke Berneta Sages. Patient instructed to remain in clinic for 20 minutes afterwards, and to report any adverse reaction to me immediately.

## 2020-04-14 ENCOUNTER — Ambulatory Visit: Payer: BC Managed Care – PPO

## 2020-04-14 DIAGNOSIS — M25531 Pain in right wrist: Secondary | ICD-10-CM | POA: Diagnosis not present

## 2020-04-14 DIAGNOSIS — M25631 Stiffness of right wrist, not elsewhere classified: Secondary | ICD-10-CM | POA: Diagnosis not present

## 2020-04-14 DIAGNOSIS — M65849 Other synovitis and tenosynovitis, unspecified hand: Secondary | ICD-10-CM | POA: Diagnosis not present

## 2020-04-14 DIAGNOSIS — M79641 Pain in right hand: Secondary | ICD-10-CM | POA: Diagnosis not present

## 2020-04-19 DIAGNOSIS — M25531 Pain in right wrist: Secondary | ICD-10-CM | POA: Diagnosis not present

## 2020-04-19 DIAGNOSIS — M65849 Other synovitis and tenosynovitis, unspecified hand: Secondary | ICD-10-CM | POA: Diagnosis not present

## 2020-04-19 DIAGNOSIS — M25631 Stiffness of right wrist, not elsewhere classified: Secondary | ICD-10-CM | POA: Diagnosis not present

## 2020-04-19 DIAGNOSIS — M79641 Pain in right hand: Secondary | ICD-10-CM | POA: Diagnosis not present

## 2020-04-21 NOTE — Progress Notes (Signed)
I reviewed and agree with the documentation and plan as outlined below.   

## 2020-04-22 DIAGNOSIS — M65849 Other synovitis and tenosynovitis, unspecified hand: Secondary | ICD-10-CM | POA: Diagnosis not present

## 2020-04-22 DIAGNOSIS — M25631 Stiffness of right wrist, not elsewhere classified: Secondary | ICD-10-CM | POA: Diagnosis not present

## 2020-04-22 DIAGNOSIS — M25531 Pain in right wrist: Secondary | ICD-10-CM | POA: Diagnosis not present

## 2020-04-22 DIAGNOSIS — M79641 Pain in right hand: Secondary | ICD-10-CM | POA: Diagnosis not present

## 2020-04-29 ENCOUNTER — Ambulatory Visit: Payer: BC Managed Care – PPO | Admitting: Internal Medicine

## 2020-04-29 DIAGNOSIS — M25631 Stiffness of right wrist, not elsewhere classified: Secondary | ICD-10-CM | POA: Diagnosis not present

## 2020-04-29 DIAGNOSIS — R3915 Urgency of urination: Secondary | ICD-10-CM | POA: Diagnosis not present

## 2020-04-29 DIAGNOSIS — M25531 Pain in right wrist: Secondary | ICD-10-CM | POA: Diagnosis not present

## 2020-04-29 DIAGNOSIS — M79641 Pain in right hand: Secondary | ICD-10-CM | POA: Diagnosis not present

## 2020-04-29 DIAGNOSIS — M65849 Other synovitis and tenosynovitis, unspecified hand: Secondary | ICD-10-CM | POA: Diagnosis not present

## 2020-05-03 DIAGNOSIS — M25631 Stiffness of right wrist, not elsewhere classified: Secondary | ICD-10-CM | POA: Diagnosis not present

## 2020-05-03 DIAGNOSIS — M65849 Other synovitis and tenosynovitis, unspecified hand: Secondary | ICD-10-CM | POA: Diagnosis not present

## 2020-05-03 DIAGNOSIS — M25531 Pain in right wrist: Secondary | ICD-10-CM | POA: Diagnosis not present

## 2020-05-03 DIAGNOSIS — M79641 Pain in right hand: Secondary | ICD-10-CM | POA: Diagnosis not present

## 2020-05-05 DIAGNOSIS — M62551 Muscle wasting and atrophy, not elsewhere classified, right thigh: Secondary | ICD-10-CM | POA: Diagnosis not present

## 2020-05-05 DIAGNOSIS — M25661 Stiffness of right knee, not elsewhere classified: Secondary | ICD-10-CM | POA: Diagnosis not present

## 2020-05-05 DIAGNOSIS — R269 Unspecified abnormalities of gait and mobility: Secondary | ICD-10-CM | POA: Diagnosis not present

## 2020-05-05 DIAGNOSIS — M25561 Pain in right knee: Secondary | ICD-10-CM | POA: Diagnosis not present

## 2020-05-10 ENCOUNTER — Ambulatory Visit: Payer: BC Managed Care – PPO | Admitting: Family Medicine

## 2020-05-11 ENCOUNTER — Other Ambulatory Visit: Payer: Self-pay

## 2020-05-12 ENCOUNTER — Ambulatory Visit: Payer: BC Managed Care – PPO

## 2020-05-13 ENCOUNTER — Ambulatory Visit (INDEPENDENT_AMBULATORY_CARE_PROVIDER_SITE_OTHER): Payer: BC Managed Care – PPO

## 2020-05-13 ENCOUNTER — Other Ambulatory Visit: Payer: Self-pay

## 2020-05-13 ENCOUNTER — Ambulatory Visit: Payer: BC Managed Care – PPO

## 2020-05-13 DIAGNOSIS — M79641 Pain in right hand: Secondary | ICD-10-CM | POA: Diagnosis not present

## 2020-05-13 DIAGNOSIS — E538 Deficiency of other specified B group vitamins: Secondary | ICD-10-CM

## 2020-05-13 DIAGNOSIS — M25531 Pain in right wrist: Secondary | ICD-10-CM | POA: Diagnosis not present

## 2020-05-13 DIAGNOSIS — M62551 Muscle wasting and atrophy, not elsewhere classified, right thigh: Secondary | ICD-10-CM | POA: Diagnosis not present

## 2020-05-13 DIAGNOSIS — M25631 Stiffness of right wrist, not elsewhere classified: Secondary | ICD-10-CM | POA: Diagnosis not present

## 2020-05-13 DIAGNOSIS — M25661 Stiffness of right knee, not elsewhere classified: Secondary | ICD-10-CM | POA: Diagnosis not present

## 2020-05-13 DIAGNOSIS — M65849 Other synovitis and tenosynovitis, unspecified hand: Secondary | ICD-10-CM | POA: Diagnosis not present

## 2020-05-13 DIAGNOSIS — M25561 Pain in right knee: Secondary | ICD-10-CM | POA: Diagnosis not present

## 2020-05-13 DIAGNOSIS — R269 Unspecified abnormalities of gait and mobility: Secondary | ICD-10-CM | POA: Diagnosis not present

## 2020-05-13 MED ORDER — CYANOCOBALAMIN 1000 MCG/ML IJ SOLN
1000.0000 ug | Freq: Once | INTRAMUSCULAR | Status: AC
  Administered 2020-05-13: 1000 ug via INTRAMUSCULAR

## 2020-05-13 NOTE — Progress Notes (Signed)
Pt came in today for her 2nd monthly series B12 injections, pt was given the injections in her right deltoid, pt tolerated injection well. Pt stated she sees Dr. Loletha Grayer next week and will ask about blood work and next B12 injection since she is going out of town for a month.

## 2020-05-13 NOTE — Progress Notes (Signed)
I reviewed and agree with the documentation and plan as outlined below.   

## 2020-05-19 ENCOUNTER — Encounter: Payer: Self-pay | Admitting: Family Medicine

## 2020-05-19 ENCOUNTER — Other Ambulatory Visit: Payer: Self-pay

## 2020-05-19 ENCOUNTER — Ambulatory Visit (INDEPENDENT_AMBULATORY_CARE_PROVIDER_SITE_OTHER): Payer: BC Managed Care – PPO | Admitting: Family Medicine

## 2020-05-19 DIAGNOSIS — M546 Pain in thoracic spine: Secondary | ICD-10-CM | POA: Diagnosis not present

## 2020-05-19 NOTE — Assessment & Plan Note (Signed)
Given no falls or trauma and given she has no spinous process tenderness with tightness of the trapezius and rhomboid musculature on exam this is most likely strained or pulled muscle. -Continue conservative management with Tylenol, heat, over-the-counter creams such as Voltaren and/or Tiger balm. -She is already going to physical therapy for her right wrist and she will continue going and now transition into doing exercises to help with her right sided upper to mid back pain. -Continue to improve posture

## 2020-05-19 NOTE — Progress Notes (Signed)
    SUBJECTIVE:   CHIEF COMPLAINT / HPI:   Right Mid Back Pain Kim Kim is a 64 year old female who is well-known to this clinic presenting with right-sided upper to mid back tightness and pain.  States that overall this is a chronic problem which began over a year ago when she began moving to [place] from [place].  She was moving lots of boxes and items and thinks that that is where her symptoms began.  She has been able to tolerate her symptoms and manage them with over-the-counter muscle creams and activity modification however in the past few months she feels like it is becoming slightly worse.  She presents today for evaluation to ensure "nothing else is wrong".  She denies any falls or trauma to the area.  She denies any numbness weakness or tingling coming from the area.  She states that she cannot raise her hands and put them together above her head which is something she used to do and exacerbates her symptoms and feels really tight.  PERTINENT  PMH / PSH: Hypothyroidism, OA, Osteopenia  OBJECTIVE:   BP 118/72   Ht 5\' 4"  (1.626 m)   Wt 165 lb (74.8 kg)   BMI 28.32 kg/m   MSK: Thoracic spine: - Inspection: no gross deformity or asymmetry, swelling or ecchymosis. No skin changes - Palpation: No TTP over the spinous processes, TTP along the Rhomboid and trapezius muscles with noticeable tightness of the musculature when compared to the left side - ROM: full active ROM of the lumbar spine in flexion and extension, side bending, and rotation without pain. Neurovascularly intact.   ASSESSMENT/PLAN:   Thoracic back pain Given no falls or trauma and given she has no spinous process tenderness with tightness of the trapezius and rhomboid musculature on exam this is most likely strained or pulled muscle. -Continue conservative management with Tylenol, heat, over-the-counter creams such as Voltaren and/or Tiger balm. -She is already going to physical therapy for her right  wrist and she will continue going and now transition into doing exercises to help with her right sided upper to mid back pain. -Continue to improve posture     Kim Alpha, DO [place]

## 2020-05-19 NOTE — Patient Instructions (Addendum)
It was great to meet you today! Thank you for letting me participate in your care!  Today, we discussed your upper back and posterior shoulder pain which is most likely due to overuse and an old muscle strain. Please go to PT to rehab those muscles as they were very tight. I also suggest you use a heating pad, OTC creams (Voltaren, Tiger Balm) to help with pain as needed.  Be well, Harolyn Rutherford, DO PGY-4, Sports Medicine Fellow [place] [phone]

## 2020-05-20 ENCOUNTER — Ambulatory Visit (INDEPENDENT_AMBULATORY_CARE_PROVIDER_SITE_OTHER): Payer: BC Managed Care – PPO | Admitting: Family Medicine

## 2020-05-20 ENCOUNTER — Encounter: Payer: Self-pay | Admitting: Family Medicine

## 2020-05-20 VITALS — BP 118/82 | HR 79 | Temp 97.4°F | Ht 64.0 in | Wt 165.0 lb

## 2020-05-20 DIAGNOSIS — E063 Autoimmune thyroiditis: Secondary | ICD-10-CM

## 2020-05-20 DIAGNOSIS — E559 Vitamin D deficiency, unspecified: Secondary | ICD-10-CM

## 2020-05-20 DIAGNOSIS — K219 Gastro-esophageal reflux disease without esophagitis: Secondary | ICD-10-CM | POA: Diagnosis not present

## 2020-05-20 DIAGNOSIS — E538 Deficiency of other specified B group vitamins: Secondary | ICD-10-CM

## 2020-05-20 DIAGNOSIS — E038 Other specified hypothyroidism: Secondary | ICD-10-CM | POA: Diagnosis not present

## 2020-05-20 DIAGNOSIS — E8881 Metabolic syndrome: Secondary | ICD-10-CM | POA: Diagnosis not present

## 2020-05-20 DIAGNOSIS — R269 Unspecified abnormalities of gait and mobility: Secondary | ICD-10-CM | POA: Diagnosis not present

## 2020-05-20 DIAGNOSIS — M62551 Muscle wasting and atrophy, not elsewhere classified, right thigh: Secondary | ICD-10-CM | POA: Diagnosis not present

## 2020-05-20 DIAGNOSIS — M25661 Stiffness of right knee, not elsewhere classified: Secondary | ICD-10-CM | POA: Diagnosis not present

## 2020-05-20 DIAGNOSIS — E663 Overweight: Secondary | ICD-10-CM

## 2020-05-20 DIAGNOSIS — M25561 Pain in right knee: Secondary | ICD-10-CM | POA: Diagnosis not present

## 2020-05-20 NOTE — Progress Notes (Addendum)
Kim Kim is a 64 y.o. female  Chief Complaint  Patient presents with  . Follow-up    Pt hab labs performed in March for B12.  Pt c/o having acid reflux x  90monrhs.  Pt would like referral for GI    HPI: Kim Kim is a 64 y.o. female here to f/u on her B12 deficiency. She has been receiving B12 injections since 01/2020. She states her fatigue has improved significantly.   Pt notes h/o GERD that was diagnosed while living in [place]. She was having chest pain, negative cardiac work-up. She saw GI, had endoscopy, found to have 2 gastric ulcers, and she was given Rx for PPI (pt unsure of Rx name but states she ended up switching to OTC prilosec). She is currently taking OTC prilosec but states she is having breakthru symptoms.   She would like a nutrition referral.  Lab Results  Component Value Date   [id number] 340 01/07/2020     Past Medical History:  Diagnosis Date  . Carotid atherosclerosis   . Gastric ulcer    hx of  . GERD (gastroesophageal reflux disease)   . Growth hormone deficiency ([place])   . Hiatal hernia   . Hiatal hernia   . Hypothyroidism    hx of Hashimoto thyroiditis  . Pituitary adenoma ([place])    hx of  . Seizures ([place])    temporal lobe  . Thyroid disease   . Vitamin D deficiency     Past Surgical History:  Procedure Laterality Date  . none      Social History   Socioeconomic History  . Marital status: Married    Spouse name: Gene  . Number of children: 3  . Years of education: Not on file  . Highest education level: Associate degree: occupational, Hotel manager, or vocational program  Occupational History    Comment: NA  Tobacco Use  . Smoking status: Never Smoker  . Smokeless tobacco: Never Used  Vaping Use  . Vaping Use: Never used  Substance and Sexual Activity  . Alcohol use: Never  . Drug use: Never  . Sexual activity: Yes    Birth control/protection: Post-menopausal  Other Topics Concern  . Not on file  Social History  Narrative   She recently moved from [place]. 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   Caffeine- coffee maybe 1 c, some tea   Social Determinants of Health   Financial Resource Strain:   . Difficulty of Paying Living Expenses:   Food Insecurity:   . Worried About Charity fundraiser in the Last Year:   . Arboriculturist in the Last Year:   Transportation Needs:   . Film/video editor (Medical):   Marland Kitchen Lack of Transportation (Non-Medical):   Physical Activity:   . Days of Exercise per Week:   . Minutes of Exercise per Session:   Stress:   . Feeling of Stress :   Social Connections:   . Frequency of Communication with Friends and Family:   . Frequency of Social Gatherings with Friends and Family:   . Attends Religious Services:   . Active Member of Clubs or Organizations:   . Attends Archivist Meetings:   Marland Kitchen Marital Status:   Intimate Partner Violence:   . Fear of Current or Ex-Partner:   . Emotionally Abused:   Marland Kitchen Physically Abused:   . Sexually Abused:     Family History  Problem Relation Age  of Onset  . Heart disease Mother   . Diabetes Mother   . Thyroid disease Mother        hypo  . Heart disease Father   . Hypertension Father      Immunization History  Administered Date(s) Administered  . PFIZER SARS-COV-2 Vaccination 01/03/2020, 01/24/2020    Outpatient Encounter Medications as of 05/20/2020  Medication Sig Note  . desonide (DESOWEN) 0.05 % cream APPLY TO AFFECTED AREA TWICE A DAY   . diclofenac sodium (VOLTAREN) 1 % GEL Apply topically. 12/10/2019: As needed  . fluticasone (FLONASE) 50 MCG/ACT nasal spray Place 1 spray into both nostrils as needed.    . Lactobacillus Acidophilus POWD by Does not apply route. 12/10/2019: Capsules, for hiatal hernia, 1 in am  . LAMICTAL 100 MG tablet Take 1 tablet (100 mg total) by mouth 2 (two) times daily.   Marland Kitchen levothyroxine (SYNTHROID) 50 MCG tablet Take 50 mcg by mouth daily before  breakfast. 75 Mcg every Sunday   . rosuvastatin (CRESTOR) 10 MG tablet Take 10 mg by mouth at bedtime.   . [DISCONTINUED] metFORMIN (GLUCOPHAGE-XR) 750 MG 24 hr tablet Take 750 mg by mouth every morning.   . pantoprazole (PROTONIX) 40 MG tablet Take 40 mg by mouth every morning. (Patient not taking: Reported on 05/20/2020)    No facility-administered encounter medications on file as of 05/20/2020.     ROS: Pertinent positives and negatives noted in HPI. Remainder of ROS non-contributory    No Known Allergies  BP 118/82 (BP Location: Left Arm, Patient Position: Sitting, Cuff Size: Normal)   Pulse 79   Temp (!) 97.4 F (36.3 C) (Temporal)   Ht 5\' 4"  (1.626 m)   Wt 165 lb (74.8 kg)   SpO2 98%   BMI 28.32 kg/m   Physical Exam Vitals reviewed.  Constitutional:      General: She is not in acute distress.    Appearance: Normal appearance. She is not ill-appearing.  Pulmonary:     Effort: No respiratory distress.  Neurological:     Mental Status: She is alert and oriented to person, place, and time.  Psychiatric:        Mood and Affect: Mood normal.        Behavior: Behavior normal.      A/P:  1. B12 deficiency - cont with monthly injections thru 07/2020 and then recheck b12 level  2. Gastroesophageal reflux disease, unspecified whether esophagitis present - pt requests GI referral - recommeded weight loss, avoidance trigger foods, 2 wk trial of 40mg  prilosec, Tums PRN - Ambulatory referral to Gastroenterology  3. Dysmetabolic syndrome X - Hemoglobin A1c; Future  4. Hypothyroidism due to Hashimoto's thyroiditis - TSH; Future - T4, free; Future  5. Vitamin D deficiency - VITAMIN D 25 Hydroxy (Vit-D Deficiency, Fractures); Future  Discussed plan and reviewed medications with patient, including risks, benefits, and potential side effects. Pt expressed understand. All questions answered.   This visit occurred during the SARS-CoV-2 public health emergency.  Safety  protocols were in place, including screening questions prior to the visit, additional usage of staff PPE, and extensive cleaning of exam room while observing appropriate contact time as indicated for disinfecting solutions.

## 2020-05-20 NOTE — Addendum Note (Signed)
Addended by: Ronnald Nian on: 05/20/2020 09:14 AM   Modules accepted: Orders

## 2020-05-24 ENCOUNTER — Encounter: Payer: Self-pay | Admitting: Gastroenterology

## 2020-06-02 DIAGNOSIS — L309 Dermatitis, unspecified: Secondary | ICD-10-CM | POA: Diagnosis not present

## 2020-06-02 DIAGNOSIS — L304 Erythema intertrigo: Secondary | ICD-10-CM | POA: Diagnosis not present

## 2020-06-02 DIAGNOSIS — L988 Other specified disorders of the skin and subcutaneous tissue: Secondary | ICD-10-CM | POA: Diagnosis not present

## 2020-06-03 DIAGNOSIS — K5904 Chronic idiopathic constipation: Secondary | ICD-10-CM | POA: Diagnosis not present

## 2020-06-03 DIAGNOSIS — K449 Diaphragmatic hernia without obstruction or gangrene: Secondary | ICD-10-CM | POA: Diagnosis not present

## 2020-06-03 DIAGNOSIS — Z6829 Body mass index (BMI) 29.0-29.9, adult: Secondary | ICD-10-CM | POA: Diagnosis not present

## 2020-06-03 DIAGNOSIS — K219 Gastro-esophageal reflux disease without esophagitis: Secondary | ICD-10-CM | POA: Diagnosis not present

## 2020-06-08 ENCOUNTER — Ambulatory Visit: Payer: BC Managed Care – PPO | Admitting: Diagnostic Neuroimaging

## 2020-06-08 DIAGNOSIS — S99921A Unspecified injury of right foot, initial encounter: Secondary | ICD-10-CM | POA: Diagnosis not present

## 2020-06-08 DIAGNOSIS — W2203XA Walked into furniture, initial encounter: Secondary | ICD-10-CM | POA: Diagnosis not present

## 2020-06-08 DIAGNOSIS — S92501A Displaced unspecified fracture of right lesser toe(s), initial encounter for closed fracture: Secondary | ICD-10-CM | POA: Diagnosis not present

## 2020-06-08 DIAGNOSIS — M79671 Pain in right foot: Secondary | ICD-10-CM | POA: Diagnosis not present

## 2020-06-25 DIAGNOSIS — L989 Disorder of the skin and subcutaneous tissue, unspecified: Secondary | ICD-10-CM | POA: Diagnosis not present

## 2020-07-02 DIAGNOSIS — M2569 Stiffness of other specified joint, not elsewhere classified: Secondary | ICD-10-CM | POA: Diagnosis not present

## 2020-07-02 DIAGNOSIS — M549 Dorsalgia, unspecified: Secondary | ICD-10-CM | POA: Diagnosis not present

## 2020-07-02 DIAGNOSIS — R293 Abnormal posture: Secondary | ICD-10-CM | POA: Diagnosis not present

## 2020-07-06 DIAGNOSIS — R7303 Prediabetes: Secondary | ICD-10-CM | POA: Diagnosis not present

## 2020-07-06 DIAGNOSIS — E039 Hypothyroidism, unspecified: Secondary | ICD-10-CM | POA: Diagnosis not present

## 2020-07-06 DIAGNOSIS — E785 Hyperlipidemia, unspecified: Secondary | ICD-10-CM | POA: Diagnosis not present

## 2020-07-06 DIAGNOSIS — G40109 Localization-related (focal) (partial) symptomatic epilepsy and epileptic syndromes with simple partial seizures, not intractable, without status epilepticus: Secondary | ICD-10-CM | POA: Diagnosis not present

## 2020-07-07 DIAGNOSIS — R293 Abnormal posture: Secondary | ICD-10-CM | POA: Diagnosis not present

## 2020-07-07 DIAGNOSIS — M2569 Stiffness of other specified joint, not elsewhere classified: Secondary | ICD-10-CM | POA: Diagnosis not present

## 2020-07-07 DIAGNOSIS — M549 Dorsalgia, unspecified: Secondary | ICD-10-CM | POA: Diagnosis not present

## 2020-07-09 DIAGNOSIS — M2569 Stiffness of other specified joint, not elsewhere classified: Secondary | ICD-10-CM | POA: Diagnosis not present

## 2020-07-09 DIAGNOSIS — L259 Unspecified contact dermatitis, unspecified cause: Secondary | ICD-10-CM | POA: Diagnosis not present

## 2020-07-09 DIAGNOSIS — M549 Dorsalgia, unspecified: Secondary | ICD-10-CM | POA: Diagnosis not present

## 2020-07-09 DIAGNOSIS — R293 Abnormal posture: Secondary | ICD-10-CM | POA: Diagnosis not present

## 2020-07-12 ENCOUNTER — Ambulatory Visit: Payer: BC Managed Care – PPO | Admitting: Gastroenterology

## 2020-07-14 ENCOUNTER — Other Ambulatory Visit: Payer: Self-pay

## 2020-07-14 ENCOUNTER — Ambulatory Visit: Payer: BC Managed Care – PPO

## 2020-07-15 ENCOUNTER — Ambulatory Visit (INDEPENDENT_AMBULATORY_CARE_PROVIDER_SITE_OTHER): Payer: BC Managed Care – PPO

## 2020-07-15 DIAGNOSIS — E538 Deficiency of other specified B group vitamins: Secondary | ICD-10-CM | POA: Diagnosis not present

## 2020-07-15 MED ORDER — CYANOCOBALAMIN 1000 MCG/ML IJ SOLN
1000.0000 ug | Freq: Once | INTRAMUSCULAR | Status: AC
  Administered 2020-07-15: 1000 ug via INTRAMUSCULAR

## 2020-07-15 NOTE — Progress Notes (Signed)
I reviewed and agree with the documentation and plan as outlined below.   

## 2020-07-15 NOTE — Progress Notes (Signed)
Pt came in to receive her monthly B12 injection, pt received it in her left deltoid, pt tolerated injection well, pt made her next monthly B12 injection appointment and lab appointment and follow-up appointment with you while she was here. She stayed in office 15 min.

## 2020-07-20 DIAGNOSIS — M2569 Stiffness of other specified joint, not elsewhere classified: Secondary | ICD-10-CM | POA: Diagnosis not present

## 2020-07-20 DIAGNOSIS — R293 Abnormal posture: Secondary | ICD-10-CM | POA: Diagnosis not present

## 2020-07-20 DIAGNOSIS — M549 Dorsalgia, unspecified: Secondary | ICD-10-CM | POA: Diagnosis not present

## 2020-07-22 DIAGNOSIS — M2569 Stiffness of other specified joint, not elsewhere classified: Secondary | ICD-10-CM | POA: Diagnosis not present

## 2020-07-22 DIAGNOSIS — M549 Dorsalgia, unspecified: Secondary | ICD-10-CM | POA: Diagnosis not present

## 2020-07-22 DIAGNOSIS — R293 Abnormal posture: Secondary | ICD-10-CM | POA: Diagnosis not present

## 2020-07-29 NOTE — Progress Notes (Deleted)
Medical Nutrition Therapy Appt start time: 1100 end time: 1200 (1 hour) Assessment:  Primary concerns today: Weight management, Blood sugar control and osteopenia.  Patient has completed 2nd dose of COVID-19 vaccine 01/24/20.  Learning Readiness:   Not ready  Contemplating  Ready  Change in progress  Kim Kim was referred by sports medicine physician Karlton Lemon, MD for osteopenia, h/o pre-DM, and "COVID weight gain."   Hgb A1c was 5.8 on 01/07/20.   She and her husband moved to [place] from [place] 2 yrs ago, and moved to a new house in Sept 2021.    Usual eating pattern: *** meals and *** snacks per day. Frequent foods and beverages: ***.   Avoided foods: ***.   Usual physical activity: ***. Sleep: *** Weight: ***; height 64"; BMI *** FBG:  24-hr recall: (Up at  AM) B ( AM)-    Snk ( AM)-    L ( PM)-   Snk ( PM)-   D ( PM)-   Snk ( PM)-   Typical day? {yes no:314532}   Nutritional Diagnosis:  {CHL AMB NUTRITIONAL DIAGNOSIS:[phone]}  Handouts given during visit include:  After-Visit Summary (AVS)  ***  Demonstrated degree of understanding via:  Teach Back  Barriers to learning/adherence to lifestyle change: ***  Monitoring/Evaluation:  Dietary intake, exercise, ***, and body weight {follow up:15908}.

## 2020-07-30 DIAGNOSIS — M549 Dorsalgia, unspecified: Secondary | ICD-10-CM | POA: Diagnosis not present

## 2020-07-30 DIAGNOSIS — R293 Abnormal posture: Secondary | ICD-10-CM | POA: Diagnosis not present

## 2020-07-30 DIAGNOSIS — M2569 Stiffness of other specified joint, not elsewhere classified: Secondary | ICD-10-CM | POA: Diagnosis not present

## 2020-08-02 ENCOUNTER — Ambulatory Visit: Payer: BC Managed Care – PPO | Admitting: Family Medicine

## 2020-08-03 DIAGNOSIS — M2569 Stiffness of other specified joint, not elsewhere classified: Secondary | ICD-10-CM | POA: Diagnosis not present

## 2020-08-03 DIAGNOSIS — R293 Abnormal posture: Secondary | ICD-10-CM | POA: Diagnosis not present

## 2020-08-03 DIAGNOSIS — M549 Dorsalgia, unspecified: Secondary | ICD-10-CM | POA: Diagnosis not present

## 2020-08-04 ENCOUNTER — Encounter: Payer: Self-pay | Admitting: Gastroenterology

## 2020-08-04 ENCOUNTER — Ambulatory Visit (INDEPENDENT_AMBULATORY_CARE_PROVIDER_SITE_OTHER): Payer: BC Managed Care – PPO | Admitting: Gastroenterology

## 2020-08-04 VITALS — BP 106/76 | HR 84 | Ht 64.0 in | Wt 172.2 lb

## 2020-08-04 DIAGNOSIS — K449 Diaphragmatic hernia without obstruction or gangrene: Secondary | ICD-10-CM

## 2020-08-04 DIAGNOSIS — K219 Gastro-esophageal reflux disease without esophagitis: Secondary | ICD-10-CM | POA: Diagnosis not present

## 2020-08-04 DIAGNOSIS — K5904 Chronic idiopathic constipation: Secondary | ICD-10-CM | POA: Diagnosis not present

## 2020-08-04 NOTE — Progress Notes (Signed)
Chief Complaint: GERD, reflux symptoms  Referring Provider:     Ronnald Nian, DO   HPI:    Kim Kim is a 64 y.o. female with a history of GERD, B12 deficiency, hiatal hernia, history of pituitary adenoma, seizure disorder, history of Hashimoto's thyroiditis with subsequent hypothyroidism, dysmetabolic syndrome, referred to the Gastroenterology Clinic for evaluation of GERD, hiatal hernia.  Previously followed with Dr. Roney Marion at [facility] in [place], [place], most recently prior to moving to [facility] in 2020.   Diagnosed with GERD in 2020. Index sxs of chest pain, dyspepsia. No HB, regurgitation, dysphagia, n/v. When cardiac w/u was negative, went to GI and performed EGD, n/f antral ulcers and HH. Started on Protonix but had issue tolerating, so changed to Prilosec 20 mg/day with resolution of symptoms.   Occasional breakthrough sxs (chest pain) while on Prilosec, typically with exercise, which resolves with prn Pepcid.  More breakthrough symptoms in the last year or so due to reduced exercise and dietary indiscretions with subsequent weight gain.  Was seen by her PCM, Dr. Letta Median, on 05/20/2020.  Given ongoing breakthrough reflux symptoms, started on trial of Prilosec 40 mg/day and Tums as needed, and referred to the GI clinic.  Today, she states she has had a good response to the higher dose of Prilosec, and has since titrated back to 20 mg/day with good relief.  She is hoping to either get off the medication completely or continue titrating to the lowest possible dose.  Separately, history of chronic idiopathic constipation treated with fiber supplement.  -01/07/2020: Normal CBC, TSH, B12, iron panel   Endoscopic History: -Colonoscopy (2017) -EGD (09/01/2019): 3 cm HH, ulcerative antritis.  Started on Protonix and stopped NSAIDs (was taking ibuprofen for dental work)   Past Medical History:  Diagnosis Date  . Carotid atherosclerosis   .  Gastric ulcer    hx of  . GERD (gastroesophageal reflux disease)   . Growth hormone deficiency ([place])   . Hiatal hernia   . Hiatal hernia   . Hypothyroidism    hx of Hashimoto thyroiditis  . Pituitary adenoma ([place])    hx of  . Seizures ([place])    temporal lobe  . Thyroid disease   . Vitamin D deficiency      Past Surgical History:  Procedure Laterality Date  . COLONOSCOPY  12/10/2015   [place] Dr Meredith Leeds  . ESOPHAGOGASTRODUODENOSCOPY  06/03/2019   Dr Celso Sickle, [place]  . none     Family History  Problem Relation Age of Onset  . Heart disease Mother   . Diabetes Mother   . Thyroid disease Mother        hypo  . Heart disease Father   . Hypertension Father   . Colon polyps Sister   . Esophageal cancer Neg Hx   . Colon cancer Neg Hx    Social History   Tobacco Use  . Smoking status: Never Smoker  . Smokeless tobacco: Never Used  Vaping Use  . Vaping Use: Never used  Substance Use Topics  . Alcohol use: Yes    Comment: ocassionally  . Drug use: Never   Current Outpatient Medications  Medication Sig Dispense Refill  . cholecalciferol (VITAMIN D3) 25 MCG (1000 UNIT) tablet Take 1,000 Units by mouth daily.    Marland Kitchen desonide (DESOWEN) 0.05 % cream Apply 1 application topically as needed.     Marland Kitchen LAMICTAL 100 MG  tablet Take 1 tablet (100 mg total) by mouth 2 (two) times daily. 180 tablet 3  . levothyroxine (SYNTHROID) 50 MCG tablet Take 50 mcg by mouth daily before breakfast. 75 Mcg every Sunday    . omeprazole (PRILOSEC) 20 MG capsule Take 20 mg by mouth daily. Daily before breakfast    . rosuvastatin (CRESTOR) 10 MG tablet Take 10 mg by mouth at bedtime.     No current facility-administered medications for this visit.   No Known Allergies   Review of Systems: All systems reviewed and negative except where noted in HPI.     Physical Exam:    Wt Readings from Last 3 Encounters:  08/04/20 172 lb 4 oz (78.1 kg)  05/20/20 165 lb (74.8 kg)  05/19/20 165 lb  (74.8 kg)    BP 106/76   Pulse 84   Ht 5\' 4"  (1.626 m)   Wt 172 lb 4 oz (78.1 kg)   BMI 29.57 kg/m  Constitutional:  Pleasant, in no acute distress. Psychiatric: Normal mood and affect. Behavior is normal. EENT: Pupils normal.  Conjunctivae are normal. No scleral icterus. Neck supple. No cervical LAD. Cardiovascular: Normal rate, regular rhythm. No edema Pulmonary/chest: Effort normal and breath sounds normal. No wheezing, rales or rhonchi. Abdominal: Soft, nondistended, nontender. Bowel sounds active throughout. There are no masses palpable. No hepatomegaly. Neurological: Alert and oriented to person place and time. Skin: Skin is warm and dry. No rashes noted.   ASSESSMENT AND PLAN;   1) Noncardiac chest pain 2) Hiatal hernia History of noncardiac chest pain which was evaluated by outside GI in 2020 when she was still living in [place], notable for hiatal hernia without erosive esophagitis.  After negative cardiac work-up, chest pain presumed to be atypical reflux, with good response to trial of PPI.  Discussed pathophysiology of reflux at length today.  Given good response to low-dose PPI and patient's inclination to be on the least amount of medications as possible, will plan for the following: -Trial reducing omeprazole to 20 mg QOD.  If breakthrough symptoms, will go back to daily dosing -Pepcid prn okay for breakthrough symptoms -Continue antireflux lifestyle/dietary modifications -Resume regular exercise program -Offered further evaluation with pH/impedance testing, which she politely declined.  No plan for surgical intervention at this juncture per patient wishes  3) Constipation -Well-controlled with fiber historically -Provided with fiber chart with detailed explanation -Discussed addition of fiber supplement  4) Colon cancer screening -Colonoscopy 2017 was normal per patient and chart review -Repeat 2027  RTC as needed    Kim Bullion, DO, FACG  08/04/2020,  2:49 PM   Kim Kim, Kim Fila, DO

## 2020-08-04 NOTE — Patient Instructions (Addendum)
If you are age 62 or older, your body mass index should be between 23-30. Your Body mass index is 29.57 kg/m. If this is out of the aforementioned range listed, please consider follow up with your Primary Care Provider.  If you are age 16 or younger, your body mass index should be between 19-25. Your Body mass index is 29.57 kg/m. If this is out of the aformentioned range listed, please consider follow up with your Primary Care Provider.   Start a fiber supplement (Benefiber, Citrucel)  See fiber chart below.  It was a pleasure to see you today!  Gerrit Heck, D.O.  Fiber Chart  You should be consuming 25-30g of fiber per day and drinking 8 glasses of water to help your bowels move regularly.  In the chart below you can look up how much fiber you are getting in an average day.  If you are not getting enough fiber, you should add a fiber supplement to your diet.  Examples of this include Metamucil, FiberCon and Citrucel.  These can be purchased at your local grocery store or pharmacy.      [email or web address]

## 2020-08-05 DIAGNOSIS — M2569 Stiffness of other specified joint, not elsewhere classified: Secondary | ICD-10-CM | POA: Diagnosis not present

## 2020-08-05 DIAGNOSIS — R293 Abnormal posture: Secondary | ICD-10-CM | POA: Diagnosis not present

## 2020-08-05 DIAGNOSIS — M549 Dorsalgia, unspecified: Secondary | ICD-10-CM | POA: Diagnosis not present

## 2020-08-06 ENCOUNTER — Telehealth: Payer: Self-pay | Admitting: Gastroenterology

## 2020-08-06 ENCOUNTER — Other Ambulatory Visit: Payer: Self-pay | Admitting: Gastroenterology

## 2020-08-06 MED ORDER — OMEPRAZOLE 40 MG PO CPDR: 40.0000 mg | DELAYED_RELEASE_CAPSULE | Freq: Two times a day (BID) | ORAL | 1 refills | Status: DC

## 2020-08-06 NOTE — Telephone Encounter (Signed)
I spoke with the patient today.  Have not reduced omeprazole, so still taking 20 mg/day, but with 2 breakthrough episodes this week requiring Pepcid.  Both breakthrough episodes were with exertion/exercise.  Plan for the following: -Increase omeprazole to 40 mg/day x4 weeks.  Please send in Rx for 1 month supply with 1 refill -Follow-up with me in the office in 4-5 weeks -Okay to use Pepcid for breakthrough symptoms -Patient again declines additional testing such as pH/impedance, EGD, or preoperative assessment for antireflux surgical options

## 2020-08-06 NOTE — Telephone Encounter (Signed)
Spoke to patient to schedule a 4-5 week follow up appointment. New prescription sent to her pharmacy. She will call the office with any questions or concerns.

## 2020-08-06 NOTE — Telephone Encounter (Signed)
Spoke to patient who was in office on 08/04/20 for non- cardiac chest pain symptoms. She is not able to follow Dr De Hollingshead recommendations for reducing her Omeprazole to 20 mg QOD with Pepcid as needed for breakthrough pain. She does not wish to pursue the [facility] study due to having a cleft palate and feels she will not tolerate the procedure. Patient reports worsening symptoms while being physically active,and  lessening her activity level is not an option for her. She is requesting a call from Dr Bryan Lemma to discuss other treatment options. She was advised to send him a [employer] as a better option. Patient stated she will send MD a message through [facility] Chart.

## 2020-08-11 DIAGNOSIS — R293 Abnormal posture: Secondary | ICD-10-CM | POA: Diagnosis not present

## 2020-08-11 DIAGNOSIS — M549 Dorsalgia, unspecified: Secondary | ICD-10-CM | POA: Diagnosis not present

## 2020-08-11 DIAGNOSIS — M2569 Stiffness of other specified joint, not elsewhere classified: Secondary | ICD-10-CM | POA: Diagnosis not present

## 2020-08-13 DIAGNOSIS — M2569 Stiffness of other specified joint, not elsewhere classified: Secondary | ICD-10-CM | POA: Diagnosis not present

## 2020-08-13 DIAGNOSIS — M549 Dorsalgia, unspecified: Secondary | ICD-10-CM | POA: Diagnosis not present

## 2020-08-13 DIAGNOSIS — R293 Abnormal posture: Secondary | ICD-10-CM | POA: Diagnosis not present

## 2020-08-14 ENCOUNTER — Other Ambulatory Visit: Payer: Self-pay

## 2020-08-14 ENCOUNTER — Ambulatory Visit: Payer: BC Managed Care – PPO | Attending: Internal Medicine

## 2020-08-14 DIAGNOSIS — Z23 Encounter for immunization: Secondary | ICD-10-CM

## 2020-08-14 NOTE — Progress Notes (Signed)
   Covid-19 Vaccination Clinic  Name:  Kim Kim    MRN: [id number] DOB: 01/10/1956  08/14/2020  [name] was observed post Covid-19 immunization for 15 minutes without incident. She was provided with Vaccine Information Sheet and instruction to access the V-Safe system.   [name] was instructed to call 911 with any severe reactions post vaccine: Marland Kitchen Difficulty breathing  . Swelling of face and throat  . A fast heartbeat  . A bad rash all over body  . Dizziness and weakness

## 2020-08-17 ENCOUNTER — Ambulatory Visit (INDEPENDENT_AMBULATORY_CARE_PROVIDER_SITE_OTHER): Payer: BC Managed Care – PPO

## 2020-08-17 ENCOUNTER — Other Ambulatory Visit: Payer: Self-pay

## 2020-08-17 ENCOUNTER — Ambulatory Visit: Payer: BC Managed Care – PPO

## 2020-08-17 DIAGNOSIS — M549 Dorsalgia, unspecified: Secondary | ICD-10-CM | POA: Diagnosis not present

## 2020-08-17 DIAGNOSIS — E538 Deficiency of other specified B group vitamins: Secondary | ICD-10-CM | POA: Diagnosis not present

## 2020-08-17 DIAGNOSIS — R293 Abnormal posture: Secondary | ICD-10-CM | POA: Diagnosis not present

## 2020-08-17 DIAGNOSIS — M2569 Stiffness of other specified joint, not elsewhere classified: Secondary | ICD-10-CM | POA: Diagnosis not present

## 2020-08-17 MED ORDER — CYANOCOBALAMIN 1000 MCG/ML IJ SOLN
1000.0000 ug | Freq: Once | INTRAMUSCULAR | Status: AC
  Administered 2020-08-17: 1000 ug via INTRAMUSCULAR

## 2020-08-17 NOTE — Progress Notes (Signed)
Per orders of Dr Bryan Lemma, injection of B12 given by Armandina Gemma, cma.  Patient tolerated injection well.

## 2020-08-20 DIAGNOSIS — R293 Abnormal posture: Secondary | ICD-10-CM | POA: Diagnosis not present

## 2020-08-20 DIAGNOSIS — M2569 Stiffness of other specified joint, not elsewhere classified: Secondary | ICD-10-CM | POA: Diagnosis not present

## 2020-08-20 DIAGNOSIS — M549 Dorsalgia, unspecified: Secondary | ICD-10-CM | POA: Diagnosis not present

## 2020-08-22 ENCOUNTER — Other Ambulatory Visit: Payer: Self-pay | Admitting: Gastroenterology

## 2020-08-23 DIAGNOSIS — R309 Painful micturition, unspecified: Secondary | ICD-10-CM | POA: Diagnosis not present

## 2020-08-24 DIAGNOSIS — M2569 Stiffness of other specified joint, not elsewhere classified: Secondary | ICD-10-CM | POA: Diagnosis not present

## 2020-08-24 DIAGNOSIS — M549 Dorsalgia, unspecified: Secondary | ICD-10-CM | POA: Diagnosis not present

## 2020-08-24 DIAGNOSIS — R293 Abnormal posture: Secondary | ICD-10-CM | POA: Diagnosis not present

## 2020-08-25 NOTE — Progress Notes (Signed)
I reviewed and agree with the documentation and plan as outlined below.   

## 2020-08-26 ENCOUNTER — Telehealth: Payer: Self-pay | Admitting: Gastroenterology

## 2020-08-26 NOTE — Telephone Encounter (Signed)
error 

## 2020-08-26 NOTE — Telephone Encounter (Signed)
Spoke to patient to inform her that we did not have a sooner appointment ,but she has been put on a cancellation list. Patient voiced understanding.

## 2020-09-01 DIAGNOSIS — R293 Abnormal posture: Secondary | ICD-10-CM | POA: Diagnosis not present

## 2020-09-01 DIAGNOSIS — M2569 Stiffness of other specified joint, not elsewhere classified: Secondary | ICD-10-CM | POA: Diagnosis not present

## 2020-09-01 DIAGNOSIS — M549 Dorsalgia, unspecified: Secondary | ICD-10-CM | POA: Diagnosis not present

## 2020-09-02 DIAGNOSIS — N952 Postmenopausal atrophic vaginitis: Secondary | ICD-10-CM | POA: Diagnosis not present

## 2020-09-02 DIAGNOSIS — L089 Local infection of the skin and subcutaneous tissue, unspecified: Secondary | ICD-10-CM | POA: Diagnosis not present

## 2020-09-02 DIAGNOSIS — R319 Hematuria, unspecified: Secondary | ICD-10-CM | POA: Diagnosis not present

## 2020-09-03 ENCOUNTER — Ambulatory Visit: Payer: BC Managed Care – PPO | Admitting: Gastroenterology

## 2020-09-06 ENCOUNTER — Other Ambulatory Visit: Payer: Self-pay

## 2020-09-06 ENCOUNTER — Other Ambulatory Visit (INDEPENDENT_AMBULATORY_CARE_PROVIDER_SITE_OTHER): Payer: BC Managed Care – PPO

## 2020-09-06 DIAGNOSIS — E559 Vitamin D deficiency, unspecified: Secondary | ICD-10-CM | POA: Diagnosis not present

## 2020-09-06 DIAGNOSIS — R7989 Other specified abnormal findings of blood chemistry: Secondary | ICD-10-CM | POA: Diagnosis not present

## 2020-09-06 DIAGNOSIS — E063 Autoimmune thyroiditis: Secondary | ICD-10-CM

## 2020-09-06 DIAGNOSIS — E8881 Metabolic syndrome: Secondary | ICD-10-CM | POA: Diagnosis not present

## 2020-09-06 DIAGNOSIS — E038 Other specified hypothyroidism: Secondary | ICD-10-CM | POA: Diagnosis not present

## 2020-09-06 DIAGNOSIS — E538 Deficiency of other specified B group vitamins: Secondary | ICD-10-CM

## 2020-09-06 LAB — FERRITIN: Ferritin: 22.7 ng/mL (ref 10.0–291.0)

## 2020-09-06 LAB — VITAMIN B12: Vitamin B-12: 499 pg/mL (ref 211–911)

## 2020-09-06 LAB — HEMOGLOBIN A1C: Hgb A1c MFr Bld: 6.1 % (ref 4.6–6.5)

## 2020-09-06 LAB — T4, FREE: Free T4: 1.11 ng/dL (ref 0.60–1.60)

## 2020-09-06 LAB — VITAMIN D 25 HYDROXY (VIT D DEFICIENCY, FRACTURES): VITD: 36.99 ng/mL (ref 30.00–100.00)

## 2020-09-06 LAB — TSH: TSH: 2.27 u[IU]/mL (ref 0.35–4.50)

## 2020-09-07 ENCOUNTER — Other Ambulatory Visit: Payer: Self-pay

## 2020-09-07 DIAGNOSIS — R293 Abnormal posture: Secondary | ICD-10-CM | POA: Diagnosis not present

## 2020-09-07 DIAGNOSIS — M2569 Stiffness of other specified joint, not elsewhere classified: Secondary | ICD-10-CM | POA: Diagnosis not present

## 2020-09-07 DIAGNOSIS — M549 Dorsalgia, unspecified: Secondary | ICD-10-CM | POA: Diagnosis not present

## 2020-09-08 ENCOUNTER — Ambulatory Visit: Payer: BC Managed Care – PPO | Admitting: Family Medicine

## 2020-09-09 ENCOUNTER — Encounter: Payer: Self-pay | Admitting: Family Medicine

## 2020-09-10 DIAGNOSIS — M549 Dorsalgia, unspecified: Secondary | ICD-10-CM | POA: Diagnosis not present

## 2020-09-10 DIAGNOSIS — M2569 Stiffness of other specified joint, not elsewhere classified: Secondary | ICD-10-CM | POA: Diagnosis not present

## 2020-09-10 DIAGNOSIS — R293 Abnormal posture: Secondary | ICD-10-CM | POA: Diagnosis not present

## 2020-09-13 DIAGNOSIS — N8111 Cystocele, midline: Secondary | ICD-10-CM | POA: Diagnosis not present

## 2020-09-14 DIAGNOSIS — R293 Abnormal posture: Secondary | ICD-10-CM | POA: Diagnosis not present

## 2020-09-14 DIAGNOSIS — M2569 Stiffness of other specified joint, not elsewhere classified: Secondary | ICD-10-CM | POA: Diagnosis not present

## 2020-09-14 DIAGNOSIS — M549 Dorsalgia, unspecified: Secondary | ICD-10-CM | POA: Diagnosis not present

## 2020-09-15 ENCOUNTER — Other Ambulatory Visit: Payer: Self-pay

## 2020-09-15 ENCOUNTER — Encounter: Payer: Self-pay | Admitting: Family Medicine

## 2020-09-15 ENCOUNTER — Ambulatory Visit (INDEPENDENT_AMBULATORY_CARE_PROVIDER_SITE_OTHER): Payer: BC Managed Care – PPO | Admitting: Family Medicine

## 2020-09-15 VITALS — BP 122/70 | HR 78 | Temp 97.3°F | Wt 169.6 lb

## 2020-09-15 DIAGNOSIS — R7303 Prediabetes: Secondary | ICD-10-CM | POA: Diagnosis not present

## 2020-09-15 DIAGNOSIS — E538 Deficiency of other specified B group vitamins: Secondary | ICD-10-CM

## 2020-09-15 MED ORDER — CYANOCOBALAMIN 1000 MCG/ML IJ SOLN
1000.0000 ug | Freq: Once | INTRAMUSCULAR | Status: AC
  Administered 2020-09-15: 1000 ug via INTRAMUSCULAR

## 2020-09-15 NOTE — Progress Notes (Addendum)
Kim Kim is a 64 y.o. female  Chief Complaint  Patient presents with  . Follow-up    follow upon blood work    HPI: Kim Kim is a 64 y.o. female seen today to f/u on recent A1C result.   Component     Latest Ref Rng & Units 01/07/2020 09/06/2020  Hemoglobin A1C     4.6 - 6.5 % 5.8 6.1   Diet: had not been eating well since 12/2019 but now living in own condo; now oatmeal or steel cut oatmeal w/ berries and boiled egg for breakfast; [employer] organic burrito (frozen).  Drinks water, no/minimal soda. Has decreased sweets.   Exercise: water aerobics 2x/wk, pilates 1x/wk, walks 1-2 miles per day, recently started playing pickleball - all new in the past month or so  Past Medical History:  Diagnosis Date  . Carotid atherosclerosis   . Gastric ulcer    hx of  . GERD (gastroesophageal reflux disease)   . Growth hormone deficiency ([place])   . Hiatal hernia   . Hiatal hernia   . Hypothyroidism    hx of Hashimoto thyroiditis  . Pituitary adenoma ([place])    hx of  . Seizures ([place])    temporal lobe  . Thyroid disease   . Vitamin D deficiency     Past Surgical History:  Procedure Laterality Date  . COLONOSCOPY  12/10/2015   [place] Dr Meredith Leeds  . ESOPHAGOGASTRODUODENOSCOPY  06/03/2019   Dr Celso Sickle, [place]  . none      Social History   Socioeconomic History  . Marital status: Married    Spouse name: Gene  . Number of children: 3  . Years of education: Not on file  . Highest education level: Associate degree: occupational, Hotel manager, or vocational program  Occupational History    Comment: NA  Tobacco Use  . Smoking status: Never Smoker  . Smokeless tobacco: Never Used  Vaping Use  . Vaping Use: Never used  Substance and Sexual Activity  . Alcohol use: Yes    Comment: ocassionally  . Drug use: Never  . Sexual activity: Yes    Birth control/protection: Post-menopausal  Other Topics Concern  . Not on file  Social History Narrative   She recently moved  from [place]. 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   Caffeine- coffee maybe 1 c, some tea   Social Determinants of Health   Financial Resource Strain:   . Difficulty of Paying Living Expenses: Not on file  Food Insecurity:   . Worried About Charity fundraiser in the Last Year: Not on file  . Ran Out of Food in the Last Year: Not on file  Transportation Needs:   . Lack of Transportation (Medical): Not on file  . Lack of Transportation (Non-Medical): Not on file  Physical Activity:   . Days of Exercise per Week: Not on file  . Minutes of Exercise per Session: Not on file  Stress:   . Feeling of Stress : Not on file  Social Connections:   . Frequency of Communication with Friends and Family: Not on file  . Frequency of Social Gatherings with Friends and Family: Not on file  . Attends Religious Services: Not on file  . Active Member of Clubs or Organizations: Not on file  . Attends Archivist Meetings: Not on file  . Marital Status: Not on file  Intimate Partner Violence:   . Fear of Current or  Ex-Partner: Not on file  . Emotionally Abused: Not on file  . Physically Abused: Not on file  . Sexually Abused: Not on file    Family History  Problem Relation Age of Onset  . Heart disease Mother   . Diabetes Mother   . Thyroid disease Mother        hypo  . Heart disease Father   . Hypertension Father   . Colon polyps Sister   . Esophageal cancer Neg Hx   . Colon cancer Neg Hx      Immunization History  Administered Date(s) Administered  . Influenza-Unspecified 08/05/2020  . PFIZER SARS-COV-2 Vaccination 01/03/2020, 01/24/2020, 08/14/2020    Outpatient Encounter Medications as of 09/15/2020  Medication Sig  . cholecalciferol (VITAMIN D3) 25 MCG (1000 UNIT) tablet Take 1,000 Units by mouth daily.  Marland Kitchen desonide (DESOWEN) 0.05 % cream Apply 1 application topically as needed.   Marland Kitchen estradiol (ESTRACE) 0.1 MG/GM vaginal cream Place vaginally.    . fluticasone (FLONASE) 50 MCG/ACT nasal spray Place into the nose.  Marland Kitchen LAMICTAL 100 MG tablet Take 1 tablet (100 mg total) by mouth 2 (two) times daily.  Marland Kitchen levothyroxine (SYNTHROID) 50 MCG tablet Take 50 mcg by mouth daily before breakfast. 75 Mcg every Sunday  . omeprazole (PRILOSEC) 20 MG capsule Take 20 mg by mouth daily. Daily before breakfast  . rosuvastatin (CRESTOR) 10 MG tablet Take 10 mg by mouth at bedtime.  Marland Kitchen omeprazole (PRILOSEC) 40 MG capsule Take 1 capsule (40 mg total) by mouth 2 (two) times daily. okay use Pepcid 20 mg for breakthrough symptoms   No facility-administered encounter medications on file as of 09/15/2020.     ROS: Pertinent positives and negatives noted in HPI. Remainder of ROS non-contributory  No Known Allergies  BP 122/70   Pulse 78   Temp (!) 97.3 F (36.3 C) (Tympanic)   Wt 169 lb 9.6 oz (76.9 kg)   SpO2 98%   BMI 29.11 kg/m   Physical Exam Vitals reviewed.  Constitutional:      General: She is not in acute distress.    Appearance: Normal appearance.  Pulmonary:     Effort: No respiratory distress.  Neurological:     General: No focal deficit present.     Mental Status: She is alert and oriented to person, place, and time.  Psychiatric:        Mood and Affect: Mood normal.        Behavior: Behavior normal.     A/P:  1. Prediabetes - A1C increased from 5.8 to 6.1 from 3/21 to 11/21 - pt has just started exercising in the past few weeks - she does admit to needing help with diet but wants a "health coach" and "personal fit" vs nutritionist   - BMI does not qualify her to be seen at [facility] - recheck A1C in 3 mo  This visit occurred during the SARS-CoV-2 public health emergency.  Safety protocols were in place, including screening questions prior to the visit, additional usage of staff PPE, and extensive cleaning of exam room while observing appropriate contact time as indicated for disinfecting solutions.

## 2020-09-15 NOTE — Addendum Note (Signed)
Addended by: Ronnald Nian on: 09/15/2020 03:11 PM   Modules accepted: Orders

## 2020-09-15 NOTE — Addendum Note (Signed)
Addended by: Lynda Rainwater on: 09/15/2020 03:22 PM   Modules accepted: Orders

## 2020-09-15 NOTE — Patient Instructions (Addendum)
Vit D 1000-2000IU daily Calcium 1200mg  daily (calcium citrate)  Gwen Her Dietician - [email or web address]

## 2020-09-16 DIAGNOSIS — R293 Abnormal posture: Secondary | ICD-10-CM | POA: Diagnosis not present

## 2020-09-16 DIAGNOSIS — M549 Dorsalgia, unspecified: Secondary | ICD-10-CM | POA: Diagnosis not present

## 2020-09-16 DIAGNOSIS — M2569 Stiffness of other specified joint, not elsewhere classified: Secondary | ICD-10-CM | POA: Diagnosis not present

## 2020-09-21 DIAGNOSIS — M2569 Stiffness of other specified joint, not elsewhere classified: Secondary | ICD-10-CM | POA: Diagnosis not present

## 2020-09-21 DIAGNOSIS — M549 Dorsalgia, unspecified: Secondary | ICD-10-CM | POA: Diagnosis not present

## 2020-09-21 DIAGNOSIS — R293 Abnormal posture: Secondary | ICD-10-CM | POA: Diagnosis not present

## 2020-09-28 DIAGNOSIS — Z03818 Encounter for observation for suspected exposure to other biological agents ruled out: Secondary | ICD-10-CM | POA: Diagnosis not present

## 2020-09-28 DIAGNOSIS — Z20822 Contact with and (suspected) exposure to covid-19: Secondary | ICD-10-CM | POA: Diagnosis not present

## 2020-09-29 ENCOUNTER — Ambulatory Visit (INDEPENDENT_AMBULATORY_CARE_PROVIDER_SITE_OTHER): Payer: BC Managed Care – PPO | Admitting: Gastroenterology

## 2020-09-29 ENCOUNTER — Encounter: Payer: Self-pay | Admitting: Gastroenterology

## 2020-09-29 ENCOUNTER — Other Ambulatory Visit (INDEPENDENT_AMBULATORY_CARE_PROVIDER_SITE_OTHER): Payer: BC Managed Care – PPO

## 2020-09-29 ENCOUNTER — Other Ambulatory Visit: Payer: Self-pay

## 2020-09-29 VITALS — BP 124/88 | HR 79 | Ht 64.0 in

## 2020-09-29 DIAGNOSIS — D649 Anemia, unspecified: Secondary | ICD-10-CM | POA: Diagnosis not present

## 2020-09-29 DIAGNOSIS — K449 Diaphragmatic hernia without obstruction or gangrene: Secondary | ICD-10-CM | POA: Diagnosis not present

## 2020-09-29 DIAGNOSIS — K219 Gastro-esophageal reflux disease without esophagitis: Secondary | ICD-10-CM | POA: Diagnosis not present

## 2020-09-29 DIAGNOSIS — R790 Abnormal level of blood mineral: Secondary | ICD-10-CM

## 2020-09-29 DIAGNOSIS — K117 Disturbances of salivary secretion: Secondary | ICD-10-CM | POA: Diagnosis not present

## 2020-09-29 LAB — CBC WITH DIFFERENTIAL/PLATELET
Basophils Absolute: 0.1 10*3/uL (ref 0.0–0.1)
Basophils Relative: 0.8 % (ref 0.0–3.0)
Eosinophils Absolute: 0.3 10*3/uL (ref 0.0–0.7)
Eosinophils Relative: 4.6 % (ref 0.0–5.0)
HCT: 36.1 % (ref 36.0–46.0)
Hemoglobin: 12 g/dL (ref 12.0–15.0)
Lymphocytes Relative: 30.9 % (ref 12.0–46.0)
Lymphs Abs: 2.2 10*3/uL (ref 0.7–4.0)
MCHC: 33.2 g/dL (ref 30.0–36.0)
MCV: 80.8 fl (ref 78.0–100.0)
Monocytes Absolute: 0.5 10*3/uL (ref 0.1–1.0)
Monocytes Relative: 7 % (ref 3.0–12.0)
Neutro Abs: 4 10*3/uL (ref 1.4–7.7)
Neutrophils Relative %: 56.7 % (ref 43.0–77.0)
Platelets: 242 10*3/uL (ref 150.0–400.0)
RBC: 4.46 Mil/uL (ref 3.87–5.11)
RDW: 14.7 % (ref 11.5–15.5)
WBC: 7.1 10*3/uL (ref 4.0–10.5)

## 2020-09-29 MED ORDER — OMEPRAZOLE MAGNESIUM 20 MG PO TBEC: 40.0000 mg | DELAYED_RELEASE_TABLET | Freq: Two times a day (BID) | ORAL | 3 refills | Status: DC

## 2020-09-29 NOTE — Progress Notes (Signed)
P  Chief Complaint:    GERD  GI History:  63 y.o. female with a history of GERD, B12 deficiency, hiatal hernia, history of pituitary adenoma, seizure disorder, history of Hashimoto's thyroiditis with subsequent hypothyroidism, dysmetabolic syndrome, initially referred to the Gastroenterology Clinic in 07/2020 for evaluation of GERD and hiatal hernia.  Previously followed with Dr. Roney Marion at [facility] in [place], [place], most recently prior to moving to [facility] in 2020.   Diagnosed with GERD in 2020. Index sxs of chest pain, dyspepsia. No HB, regurgitation, dysphagia, n/v. When cardiac w/u was negative, went to GI and performed EGD, n/f antral ulcers and HH. Started on Protonix but had issue tolerating, so changed to Prilosec 20 mg/day with resolution of symptoms.   Occasional breakthrough sxs (chest pain) while on Prilosec 20 mg, typically with exercise, which resolves with prn Pepcid.  More breakthrough symptoms in the last year or so due to reduced exercise and dietary indiscretions with subsequent weight gain.  Improvement with Prilosec 40 mg/day.  However, patient hoping to either get off medication completely or titrate dose down.  Separately, longstanding history of constipation, well-controlled with fiber and dietary modifications.  Endoscopic History: -Colonoscopy (2017) -EGD (09/01/2019): 3 cm HH, ulcerative antritis.  Started on Protonix and stopped NSAIDs (was taking ibuprofen for dental work)  -pH/impedance testing: Declined -Esophageal Manometry: None    HPI:     Patient is a 64 y.o. female presenting to the Gastroenterology Clinic for follow-up.  Initially seen by me on 08/04/2020 for evaluation of reflux and history of hiatal hernia.  Was doing reasonably well with Prilosec 40 mg/day, but did not want to keep taking the higher dose.  Trialed reducing to 20 mg QOD with Pepcid for breakthrough.  Declined pH/impedance testing.  Had increased breakthrough,  so was changed to omeprazole 40 mg/day x4 weeks with plan for follow-up.  Again declined additional testing.  Recent labs notable for ferritin 22.7.  Was previously 69 with normal H/H in 12/2019.  No new CBC or remainder of iron panel for review.  Normal B12 and vitamin D.  Today, she states she continues to have breakthrough reflux despite taking Prilosec 40 mg/day.  Some improvement, but still regular breakthrough with exercise (chest pain), particularly walking on inclines. No longer taking Pepcid for breakthrough.   Did develop dry mouth over last 4 months or so.  No dry eyes.  Review of systems:     No chest pain, no SOB, no fevers, no urinary sx   Past Medical History:  Diagnosis Date  . Carotid atherosclerosis   . Gastric ulcer    hx of  . GERD (gastroesophageal reflux disease)   . Growth hormone deficiency ([place])   . Hiatal hernia   . Hiatal hernia   . Hypothyroidism    hx of Hashimoto thyroiditis  . Pituitary adenoma ([place])    hx of  . Seizures ([place])    temporal lobe  . Thyroid disease   . Vitamin D deficiency     Patient's surgical history, family medical history, social history, medications and allergies were all reviewed in Epic    Current Outpatient Medications  Medication Sig Dispense Refill  . cholecalciferol (VITAMIN D3) 25 MCG (1000 UNIT) tablet Take 1,000 Units by mouth daily.    Marland Kitchen desonide (DESOWEN) 0.05 % cream Apply 1 application topically as needed.     . fluticasone (FLONASE) 50 MCG/ACT nasal spray Place into the nose.    Marland Kitchen LAMICTAL 100 MG  tablet Take 1 tablet (100 mg total) by mouth 2 (two) times daily. 180 tablet 3  . levothyroxine (SYNTHROID) 50 MCG tablet Take 50 mcg by mouth daily before breakfast. 75 Mcg every Sunday    . omeprazole (PRILOSEC) 40 MG capsule Take 1 capsule (40 mg total) by mouth 2 (two) times daily. okay use Pepcid 20 mg for breakthrough symptoms (Patient taking differently: Take 40 mg by mouth daily. okay use Pepcid 20 mg for  breakthrough symptoms) 30 capsule 1  . rosuvastatin (CRESTOR) 10 MG tablet Take 10 mg by mouth at bedtime.    Marland Kitchen estradiol (ESTRACE) 0.1 MG/GM vaginal cream Place vaginally. (Patient not taking: Reported on 09/29/2020)     No current facility-administered medications for this visit.    Physical Exam:     BP 124/88   Pulse 79   Ht 5\' 4"  (1.626 m)   BMI 29.11 kg/m   GENERAL:  Pleasant female in NAD PSYCH: : Cooperative, normal affect Musculoskeletal:  Normal muscle tone, normal strength NEURO: Alert and oriented x 3, no focal neurologic deficits   IMPRESSION and PLAN:    1) GERD 2) Hiatal hernia  -We again discussed reflux management today, to include options of short course of high-dose PPI, changing to alternate acid suppression medication, or further evaluation with EGD with [place] or pH/impedance testing.  She does not want to proceed with any invasive diagnostics at this time, and opted for short course of high-dose Prilosec -Increase Prilosec to 40 mg bid x4 weeks for diagnostic therapeutic intent -If no significant improvement, will again review benefits of further diagnostics -We did discuss the possibility of hypersensitivity overlap, which would require either additional diagnostic testing or trial of SNRI, TCA, etc.  Declines at this time  3) Reduced ferritin -Check iron panel and CBC given new low ferritin -Further recommendations pending lab result  4) Xerostomia -Discussed possibility of Sjogren's based on her own autoimmune history.  She wants to discuss with her dentist first whom she has an appointment with next week.     I spent 30 minutes of time, including in depth chart review, independent review of results as outlined above, communicating results with the patient directly, face-to-face time with the patient, coordinating care, and ordering studies and medications as appropriate, and documentation.   Lavena Bullion ,DO, FACG 09/29/2020, 11:43 AM

## 2020-09-29 NOTE — Patient Instructions (Addendum)
If you are age 62 or older, your body mass index should be between 23-30. Your Body mass index is 29.11 kg/m. If this is out of the aforementioned range listed, please consider follow up with your Primary Care Provider.  If you are age 49 or younger, your body mass index should be between 19-25. Your Body mass index is 29.11 kg/m. If this is out of the aformentioned range listed, please consider follow up with your Primary Care Provider.   We have sent the following medications to your pharmacy for you to pick up at your convenience:  Prilosec 40 mg twice daily for 4 weeks then daily there after.  Your provider has requested that you have lab work today. We ask that you go to our [facility] office at [address] [place], [place] [address]. Please enter through the main entrance and go to the elevator.  Press "B" on the elevator. The lab is located at the first door on the left as you exit the elevator.   Due to recent changes in healthcare laws, you may see the results of your imaging and laboratory studies on MyChart before your provider has had a chance to review them.  We understand that in some cases there may be results that are confusing or concerning to you. Not all laboratory results come back in the same time frame and the provider may be waiting for multiple results in order to interpret others.  Please give [place] 48 hours in order for your provider to thoroughly review all the results before contacting the office for clarification of your results.   Thank you for choosing me and [facility].  Vito Cirigliano, D.O.

## 2020-09-30 ENCOUNTER — Telehealth: Payer: Self-pay | Admitting: General Surgery

## 2020-09-30 LAB — IRON, TOTAL/TOTAL IRON BINDING CAP
%SAT: 12 % (calc) — ABNORMAL LOW (ref 16–45)
Iron: 44 ug/dL — ABNORMAL LOW (ref 45–160)
TIBC: 360 mcg/dL (calc) (ref 250–450)

## 2020-09-30 NOTE — Telephone Encounter (Signed)
Contacted the patient and discussed with her the lab results and Dr De Hollingshead recommendation for a direct colon/egd. Patient was driving to [facility] with her husband at the time and will call the office back to schedule.

## 2020-09-30 NOTE — Telephone Encounter (Signed)
-----   Message from [place], DO sent at 09/30/2020  2:50 PM EST ----- Labs notable for early, mild iron deficiency without anemia.  Hemoglobin 12.0, largely stable from 8 months ago (was 12.5), but there does appear to be reduced iron indices, particular compared with 8 months ago.  Next step in the evaluation for iron deficiency would be EGD/colonoscopy, and this can be scheduled direct without need for follow-up appointment in the interim.

## 2020-10-01 ENCOUNTER — Other Ambulatory Visit: Payer: Self-pay | Admitting: Gastroenterology

## 2020-10-01 MED ORDER — OMEPRAZOLE 40 MG PO CPDR: DELAYED_RELEASE_CAPSULE | ORAL | 3 refills | Status: DC

## 2020-10-11 ENCOUNTER — Other Ambulatory Visit: Payer: Self-pay

## 2020-10-11 ENCOUNTER — Telehealth: Payer: Self-pay | Admitting: Gastroenterology

## 2020-10-11 ENCOUNTER — Ambulatory Visit (AMBULATORY_SURGERY_CENTER): Payer: Self-pay

## 2020-10-11 VITALS — Ht 64.0 in | Wt 166.0 lb

## 2020-10-11 DIAGNOSIS — R790 Abnormal level of blood mineral: Secondary | ICD-10-CM

## 2020-10-11 DIAGNOSIS — K219 Gastro-esophageal reflux disease without esophagitis: Secondary | ICD-10-CM

## 2020-10-11 DIAGNOSIS — E611 Iron deficiency: Secondary | ICD-10-CM

## 2020-10-11 MED ORDER — PLENVU 140 G PO SOLR: 1.0000 | ORAL | 0 refills | Status: DC

## 2020-10-11 NOTE — Telephone Encounter (Signed)
Patient wants to switch prep, was seen this morning.

## 2020-10-11 NOTE — Telephone Encounter (Signed)
Called and spoke with patient at length- patient is requesting to switch to Miralax prep due to "it is the prep I used the last colonoscopy I had"; new instructions have been emailed to patient per her request at [email or web address];

## 2020-10-11 NOTE — Progress Notes (Signed)
No egg or soy allergy known to patient  No issues with past sedation with any surgeries or procedures No intubation problems in the past  No FH of Malignant Hyperthermia No diet pills per patient No home 02 use per patient  No blood thinners per patient  Pt denies issues with constipation  No A fib or A flutter  EMMI video via [place] 19 guidelines implemented in PV today with Pt and RN  Coupon given to pt in PV today , Code to Pharmacy  COVID vaccines completed x 2; + Booster; Due to the COVID-19 pandemic we are asking patients to follow these guidelines. Please only bring one care partner. Please be aware that your care partner may wait in the car in the parking lot or if they feel like they will be too hot to wait in the car, they may wait in the lobby on the 4th floor. All care partners are required to wear a mask the entire time (we do not have any that we can provide them), they need to practice social distancing, and we will do a Covid check for all patient's and care partners when you arrive. Also we will check their temperature and your temperature. If the care partner waits in their car they need to stay in the parking lot the entire time and we will call them on their cell phone when the patient is ready for discharge so they can bring the car to the front of the building. Also all patient's will need to wear a mask into building.

## 2020-10-14 ENCOUNTER — Ambulatory Visit: Payer: BC Managed Care – PPO

## 2020-10-14 ENCOUNTER — Ambulatory Visit: Payer: BC Managed Care – PPO | Admitting: Sports Medicine

## 2020-10-18 ENCOUNTER — Other Ambulatory Visit: Payer: Self-pay

## 2020-10-18 ENCOUNTER — Telehealth: Payer: Self-pay | Admitting: Diagnostic Neuroimaging

## 2020-10-18 MED ORDER — LAMICTAL 100 MG PO TABS: 100.0000 mg | ORAL_TABLET | Freq: Two times a day (BID) | ORAL | 0 refills | Status: DC

## 2020-10-18 NOTE — Addendum Note (Signed)
Addended by: Gildardo Griffes on: 10/18/2020 11:44 AM   Modules accepted: Orders

## 2020-10-18 NOTE — Telephone Encounter (Signed)
Spoke with patient. She is concerned she will have trouble getting Lamictal when next refill due and said she was told it had been canceled. I do not see any pending refill requests or cancellations. I sent in one additional refill to ensure patient does not run out as she will be due for a refill before her next appointment with Dr Leta Baptist. She was also made aware that a PA was done last year and was approved through 12/17/2022. She stated insurance is still the same so I do not anticipate there will be any issues. Pt verbalized appreciation for the call.

## 2020-10-18 NOTE — Telephone Encounter (Signed)
Pt. is calling about approval for LAMICTAL 100 MG tablet. She states she doesn't want to have the same problem she ran into last year trying to get this approved. Please advise.

## 2020-10-18 NOTE — Telephone Encounter (Signed)
Pt has called stating she doesn't know why the original order for the LAMICTAL 100 MG tablet was cancelled but she would like to try and get the medication filled at [id number]

## 2020-10-19 ENCOUNTER — Ambulatory Visit (INDEPENDENT_AMBULATORY_CARE_PROVIDER_SITE_OTHER): Payer: BC Managed Care – PPO

## 2020-10-19 DIAGNOSIS — E538 Deficiency of other specified B group vitamins: Secondary | ICD-10-CM

## 2020-10-19 MED ORDER — CYANOCOBALAMIN 1000 MCG/ML IJ SOLN
1000.0000 ug | Freq: Once | INTRAMUSCULAR | Status: AC
Start: 2020-10-19 — End: 2020-10-19
  Administered 2020-10-19: 1000 ug via INTRAMUSCULAR

## 2020-10-19 NOTE — Progress Notes (Signed)
Pt came in to receive her monthly B12 injection, injection given in right deltoid. Pt tolerated injection well, Pt waited 15 min after injection and instructed to make 1 month f/u for repeat injection.

## 2020-10-27 ENCOUNTER — Encounter: Payer: Self-pay | Admitting: Gastroenterology

## 2020-10-27 DIAGNOSIS — E039 Hypothyroidism, unspecified: Secondary | ICD-10-CM | POA: Diagnosis not present

## 2020-10-27 DIAGNOSIS — D352 Benign neoplasm of pituitary gland: Secondary | ICD-10-CM | POA: Diagnosis not present

## 2020-10-27 DIAGNOSIS — E063 Autoimmune thyroiditis: Secondary | ICD-10-CM | POA: Diagnosis not present

## 2020-10-27 DIAGNOSIS — E782 Mixed hyperlipidemia: Secondary | ICD-10-CM | POA: Diagnosis not present

## 2020-11-03 ENCOUNTER — Telehealth: Payer: Self-pay | Admitting: *Deleted

## 2020-11-03 NOTE — Telephone Encounter (Signed)
Pt called in- she took the first dose of Miralax this am- then her Dulcolax 4 tablets at 3:00 pm.  She did vomit about "a cup."  I encouraged her to drink clear liquids and then follow Miralax 2nd dose instructions for 11-04-20 as instructed.  Understanding voiced.

## 2020-11-04 ENCOUNTER — Other Ambulatory Visit: Payer: Self-pay

## 2020-11-04 ENCOUNTER — Ambulatory Visit (AMBULATORY_SURGERY_CENTER): Payer: BC Managed Care – PPO | Admitting: Gastroenterology

## 2020-11-04 ENCOUNTER — Telehealth: Payer: Self-pay | Admitting: General Surgery

## 2020-11-04 ENCOUNTER — Encounter: Payer: Self-pay | Admitting: Gastroenterology

## 2020-11-04 VITALS — BP 131/87 | HR 67 | Temp 97.1°F | Resp 14 | Ht 64.0 in | Wt 166.0 lb

## 2020-11-04 DIAGNOSIS — K641 Second degree hemorrhoids: Secondary | ICD-10-CM | POA: Diagnosis not present

## 2020-11-04 DIAGNOSIS — K2970 Gastritis, unspecified, without bleeding: Secondary | ICD-10-CM

## 2020-11-04 DIAGNOSIS — D509 Iron deficiency anemia, unspecified: Secondary | ICD-10-CM | POA: Diagnosis not present

## 2020-11-04 DIAGNOSIS — K319 Disease of stomach and duodenum, unspecified: Secondary | ICD-10-CM | POA: Diagnosis not present

## 2020-11-04 DIAGNOSIS — K317 Polyp of stomach and duodenum: Secondary | ICD-10-CM

## 2020-11-04 DIAGNOSIS — D175 Benign lipomatous neoplasm of intra-abdominal organs: Secondary | ICD-10-CM

## 2020-11-04 DIAGNOSIS — K219 Gastro-esophageal reflux disease without esophagitis: Secondary | ICD-10-CM

## 2020-11-04 DIAGNOSIS — K259 Gastric ulcer, unspecified as acute or chronic, without hemorrhage or perforation: Secondary | ICD-10-CM | POA: Diagnosis not present

## 2020-11-04 MED ORDER — SODIUM CHLORIDE 0.9 % IV SOLN: 500.0000 mL | Freq: Once | INTRAVENOUS | Status: DC

## 2020-11-04 NOTE — Patient Instructions (Signed)
Handouts provided on gastritis and hemorrhoids.   Repeat Colonoscopy in 10 years for screening purposes.   Depending on pathology results from upper endoscopy and repeat iron studies in 2-3 months, will consider small bowel interrogation with Video Capsule Endoscopy (VCE).   YOU HAD AN ENDOSCOPIC PROCEDURE TODAY AT THE  ENDOSCOPY CENTER:   Refer to the procedure report that was given to you for any specific questions about what was found during the examination.  If the procedure report does not answer your questions, please call your gastroenterologist to clarify.  If you requested that your care partner not be given the details of your procedure findings, then the procedure report has been included in a sealed envelope for you to review at your convenience later.  YOU SHOULD EXPECT: Some feelings of bloating in the abdomen. Passage of more gas than usual.  Walking can help get rid of the air that was put into your GI tract during the procedure and reduce the bloating. If you had a lower endoscopy (such as a colonoscopy or flexible sigmoidoscopy) you may notice spotting of blood in your stool or on the toilet paper. If you underwent a bowel prep for your procedure, you may not have a normal bowel movement for a few days.  Please Note:  You might notice some irritation and congestion in your nose or some drainage.  This is from the oxygen used during your procedure.  There is no need for concern and it should clear up in a day or so.  SYMPTOMS TO REPORT IMMEDIATELY:   Following lower endoscopy (colonoscopy or flexible sigmoidoscopy):  Excessive amounts of blood in the stool  Significant tenderness or worsening of abdominal pains  Swelling of the abdomen that is new, acute  Fever of 100F or higher   Following upper endoscopy (EGD)  Vomiting of blood or coffee ground material  New chest pain or pain under the shoulder blades  Painful or persistently difficult swallowing  New shortness  of breath  Fever of 100F or higher  Black, tarry-looking stools  For urgent or emergent issues, a gastroenterologist can be reached at any hour by calling [phone]. Do not use MyChart messaging for urgent concerns.    DIET:  We do recommend a small meal at first, but then you may proceed to your regular diet.  Drink plenty of fluids but you should avoid alcoholic beverages for 24 hours.  ACTIVITY:  You should plan to take it easy for the rest of today and you should NOT DRIVE or use heavy machinery until tomorrow (because of the sedation medicines used during the test).    FOLLOW UP: Our staff will call the number listed on your records 48-72 hours following your procedure to check on you and address any questions or concerns that you may have regarding the information given to you following your procedure. If we do not reach you, we will leave a message.  We will attempt to reach you two times.  During this call, we will ask if you have developed any symptoms of COVID 19. If you develop any symptoms (ie: fever, flu-like symptoms, shortness of breath, cough etc.) before then, please call [phone].  If you test positive for Covid 19 in the 2 weeks post procedure, please call and report this information to [place].    If any biopsies were taken you will be contacted by phone or by letter within the next 1-3 weeks.  Please call us at [phone] if  you have not heard about the biopsies in 3 weeks.    SIGNATURES/CONFIDENTIALITY: You and/or your care partner have signed paperwork which will be entered into your electronic medical record.  These signatures attest to the fact that that the information above on your After Visit Summary has been reviewed and is understood.  Full responsibility of the confidentiality of this discharge information lies with you and/or your care-partner.

## 2020-11-04 NOTE — Op Note (Signed)
[facility] Patient Name: Kim Kim Procedure Date: 11/04/2020 7:59 AM MRN: [id number] Endoscopist: Doristine Locks , MD Age: 65 Referring MD:  Date of Birth: September 03, 1956 Gender: Female Account #: [id number] Procedure:                Upper GI endoscopy Indications:              Iron deficiency, Heartburn, Suspected esophageal                            reflux Medicines:                Monitored Anesthesia Care Procedure:                Pre-Anesthesia Assessment:                           - Prior to the procedure, a History and Physical                            was performed, and patient medications and                            allergies were reviewed. The patient's tolerance of                            previous anesthesia was also reviewed. The risks                            and benefits of the procedure and the sedation                            options and risks were discussed with the patient.                            All questions were answered, and informed consent                            was obtained. Prior Anticoagulants: The patient has                            taken no previous anticoagulant or antiplatelet                            agents. ASA Grade Assessment: II - A patient with                            mild systemic disease. After reviewing the risks                            and benefits, the patient was deemed in                            satisfactory condition to undergo the procedure.  After obtaining informed consent, the endoscope was                            passed under direct vision. Throughout the                            procedure, the patient's blood pressure, pulse, and                            oxygen saturations were monitored continuously. The                            Endoscope was introduced through the mouth, and                            advanced to the second part of duodenum. The upper                             GI endoscopy was accomplished without difficulty.                            The patient tolerated the procedure well. Scope In: Scope Out: Findings:                 The examined esophagus was normal.                           The Z-line was regular and was found 35 cm from the                            incisors.                           The gastroesophageal flap valve was visualized                            endoscopically and classified as Hill Grade II                            (fold present, opens with respiration).                           A few small sessile polyps with no bleeding and no                            stigmata of recent bleeding were found in the                            gastric fundus and in the gastric body. These                            polyps were removed with a cold biopsy forceps.  Resection and retrieval were complete. Estimated                            blood loss was minimal.                           Localized mild inflammation characterized by                            erythema was found in the gastric antrum. Biopsies                            were taken with a cold forceps for Helicobacter                            pylori testing. Estimated blood loss was minimal.                           The examined duodenum was normal. Biopsies for                            histology were taken with a cold forceps for                            evaluation of celiac disease. Estimated blood loss                            was minimal. Complications:            No immediate complications. Estimated Blood Loss:     Estimated blood loss was minimal. Impression:               - Normal esophagus.                           - Z-line regular, 35 cm from the incisors.                           - Gastroesophageal flap valve classified as Hill                            Grade II (fold present, opens with  respiration).                           - A few gastric polyps. Resected and retrieved.                           - Gastritis. Biopsied.                           - Normal examined duodenum. Biopsied. Recommendation:           - Patient has a contact number available for                            emergencies. The signs and symptoms of potential  delayed complications were discussed with the                            patient. Return to normal activities tomorrow.                            Written discharge instructions were provided to the                            patient.                           - Resume previous diet.                           - Continue present medications.                           - Await pathology results.                           - Perform a colonoscopy today. Gerrit Heck, MD 11/04/2020 8:36:17 AM

## 2020-11-04 NOTE — Op Note (Signed)
[facility] Patient Name: Kim Kim Procedure Date: 11/04/2020 7:59 AM MRN: [id number] Endoscopist: Doristine Locks , MD Age: 65 Referring MD:  Date of Birth: 1956/01/26 Gender: Female Account #: [id number] Procedure:                Colonoscopy Indications:              Iron deficiency Medicines:                Monitored Anesthesia Care Procedure:                Pre-Anesthesia Assessment:                           - Prior to the procedure, a History and Physical                            was performed, and patient medications and                            allergies were reviewed. The patient's tolerance of                            previous anesthesia was also reviewed. The risks                            and benefits of the procedure and the sedation                            options and risks were discussed with the patient.                            All questions were answered, and informed consent                            was obtained. Prior Anticoagulants: The patient has                            taken no previous anticoagulant or antiplatelet                            agents. ASA Grade Assessment: II - A patient with                            mild systemic disease. After reviewing the risks                            and benefits, the patient was deemed in                            satisfactory condition to undergo the procedure.                           After obtaining informed consent, the colonoscope  was passed under direct vision. Throughout the                            procedure, the patient's blood pressure, pulse, and                            oxygen saturations were monitored continuously. The                            Olympus CF-HQ190L [id number]) Colonoscope was                            introduced through the anus and advanced to the the                            cecum, identified by appendiceal orifice and                             ileocecal valve. The colonoscopy was performed                            without difficulty. The patient tolerated the                            procedure well. The quality of the bowel                            preparation was good after irrigation. The                            ileocecal valve, appendiceal orifice, and rectum                            were photographed. Scope In: 8:12:33 AM Scope Out: 8:27:36 AM Scope Withdrawal Time: 0 hours 10 minutes 42 seconds  Total Procedure Duration: 0 hours 15 minutes 3 seconds  Findings:                 Hemorrhoids were found on perianal exam.                           There was a small lipoma, in the cecum.                           The colon otherwise appeared normal throughout.                            There were areas of solid food debris that were                            irrigated with good visualization.                           Non-bleeding internal hemorrhoids were found during  retroflexion. The hemorrhoids were small and Grade                            II (internal hemorrhoids that prolapse but reduce                            spontaneously). Complications:            No immediate complications. Estimated Blood Loss:     Estimated blood loss: none. Impression:               - Hemorrhoids found on perianal exam.                           - Small lipoma in the cecum.                           - The entire examined colon is normal.                           - Non-bleeding internal hemorrhoids.                           - No specimens collected. Recommendation:           - Patient has a contact number available for                            emergencies. The signs and symptoms of potential                            delayed complications were discussed with the                            patient. Return to normal activities tomorrow.                            Written  discharge instructions were provided to the                            patient.                           - Resume previous diet.                           - Continue present medications.                           - Repeat colonoscopy in 10 years for screening                            purposes.                           - Return to GI clinic at appointment to be  scheduled.                           - Depending on pathology results from upper                            endoscopy and repeat iron studies in 2-3 months,                            will consider small bowel interrogation with Video                            Capsule Endoscopy (VCE). Gerrit Heck, MD 11/04/2020 8:42:41 AM

## 2020-11-04 NOTE — Progress Notes (Signed)
Called to room to assist during endoscopic procedure.  Patient ID and intended procedure confirmed with present staff. Received instructions for my participation in the procedure from the performing physician.  

## 2020-11-04 NOTE — Progress Notes (Signed)
Report to PACU, RN, vss, BBS= Clear.  

## 2020-11-04 NOTE — Telephone Encounter (Signed)
-----   Message from Vito Cirigliano V, DO sent at 11/04/2020 10:13 AM EST ----- Can you please set this patient up for Esophageal Manometry and pH/Impedance testing off PPI x7 days. Thanks.   

## 2020-11-04 NOTE — Progress Notes (Signed)
Pt's states no medical or surgical changes since previsit or office visit. 

## 2020-11-05 ENCOUNTER — Other Ambulatory Visit: Payer: Self-pay | Admitting: General Surgery

## 2020-11-05 ENCOUNTER — Encounter: Payer: Self-pay | Admitting: General Surgery

## 2020-11-05 ENCOUNTER — Telehealth: Payer: Self-pay | Admitting: General Surgery

## 2020-11-05 DIAGNOSIS — K219 Gastro-esophageal reflux disease without esophagitis: Secondary | ICD-10-CM

## 2020-11-05 DIAGNOSIS — K449 Diaphragmatic hernia without obstruction or gangrene: Secondary | ICD-10-CM

## 2020-11-05 NOTE — Telephone Encounter (Signed)
-----   Message from Philo, DO sent at 11/04/2020 10:13 AM EST ----- Can you please set this patient up for Esophageal Manometry and pH/Impedance testing off PPI x7 days. Thanks.

## 2020-11-05 NOTE — Telephone Encounter (Signed)
Spoke with the patient and she stated to go forward with scheduling the eso mano with ph impendence. Explained I would schedule the test at [facility] hospital, she will need to Covid test prior to the procedure and quarantine. The patient verbalized understanding and asked if I would send the instructions to mychart and print them and mail to her address.

## 2020-11-08 ENCOUNTER — Telehealth: Payer: Self-pay

## 2020-11-08 ENCOUNTER — Telehealth: Payer: Self-pay | Admitting: General Surgery

## 2020-11-08 NOTE — Telephone Encounter (Signed)
Covid-19 screening questions   Do you now or have you had a fever in the last 14 days? No.  Do you have any respiratory symptoms of shortness of breath or cough now or in the last 14 days? No.  Do you have any family members or close contacts with diagnosed or suspected Covid-19 in the past 14 days? No. Have you been tested for Covid-19 and found to be positive? No.       Follow up Call-  Call back number 11/04/2020  Post procedure Call Back phone  # [phone]  Permission to leave phone message Yes     Patient questions:  Do you have a fever, pain , or abdominal swelling? No. Pain Score  0 *  Have you tolerated food without any problems? Yes.    Have you been able to return to your normal activities? Yes.    Do you have any questions about your discharge instructions: Diet   No. Medications  No. Follow up visit  No.  Do you have questions or concerns about your Care? Yes.  Pt. Bowel movements have not returned to normal, but pt. Has returned to her diet and activities.  Told pt. To call back if she has any further questions, and that results of biopsies will be available in My Chart.  Actions: * If pain score is 4 or above: No action needed, pain <4.

## 2020-11-08 NOTE — Telephone Encounter (Signed)
Patient called to cancel manometry while the covid surge is going on. She would like to talk with Dr Raina Mina one more time before she has it done.  Spoke with Myriam Jacobson at endoscopy scheduling and she cancelled the procedure.

## 2020-11-08 NOTE — Telephone Encounter (Signed)
Pt is requesting a call back from a nurse to reschedule her procedure that is scheduled at the hospital

## 2020-11-09 ENCOUNTER — Ambulatory Visit: Payer: BC Managed Care – PPO | Admitting: Sports Medicine

## 2020-11-11 ENCOUNTER — Encounter: Payer: Self-pay | Admitting: Gastroenterology

## 2020-11-12 ENCOUNTER — Other Ambulatory Visit (HOSPITAL_COMMUNITY): Payer: BC Managed Care – PPO

## 2020-11-16 ENCOUNTER — Other Ambulatory Visit: Payer: Self-pay | Admitting: Gastroenterology

## 2020-11-16 ENCOUNTER — Encounter: Payer: Self-pay | Admitting: Gastroenterology

## 2020-11-16 ENCOUNTER — Ambulatory Visit (INDEPENDENT_AMBULATORY_CARE_PROVIDER_SITE_OTHER): Payer: BC Managed Care – PPO | Admitting: Gastroenterology

## 2020-11-16 VITALS — BP 120/70 | HR 75 | Ht 64.0 in | Wt 155.0 lb

## 2020-11-16 DIAGNOSIS — E611 Iron deficiency: Secondary | ICD-10-CM

## 2020-11-16 DIAGNOSIS — K219 Gastro-esophageal reflux disease without esophagitis: Secondary | ICD-10-CM | POA: Diagnosis not present

## 2020-11-16 DIAGNOSIS — R0789 Other chest pain: Secondary | ICD-10-CM | POA: Diagnosis not present

## 2020-11-16 MED ORDER — OMEPRAZOLE 40 MG PO CPDR
40.0000 mg | DELAYED_RELEASE_CAPSULE | Freq: Two times a day (BID) | ORAL | 3 refills | Status: DC
Start: 2020-11-16 — End: 2022-11-28

## 2020-11-16 NOTE — Progress Notes (Signed)
P  Chief Complaint:    Procedure follow-up, GERD, iron deficiency  GI History: 65 y.o.femalewith a history of GERD, B12 deficiency, hiatal hernia, history of pituitary adenoma, seizure disorder, history of Hashimoto's thyroiditis with subsequent hypothyroidism, dysmetabolic syndrome, initially referred to the Gastroenterology Clinic in 07/2020 for evaluation of GERD and hiatal hernia.  Previously followed with Dr. Roney Marion at [facility] in [place], [place], prior to moving to [place].  Diagnosed with GERD in 2020. Index sxs of chest pain, dyspepsia. No HB, regurgitation, dysphagia, n/v.When cardiac w/u was negative, went to GI and performed EGD, n/f antral ulcers and HH. Started on Protonix but had issue tolerating, so changed to Prilosec 20 mg/daywith resolution of symptoms.   Occasional breakthrough sxs (chest pain) while on Prilosec 20 mg, typically with exercise,which resolveswith prn Pepcid. More breakthrough symptoms in the last year or so due to reduced exercise and dietary indiscretions with subsequent weight gain.  Improvement with Prilosec 40 mg/day.    Breakthrough when titrating to  20 mg QOD. however, patient hoping to either get off medication completely or titrate dose down.  Separately, longstanding history of constipation, well-controlled with fiber and dietary modifications.  Endoscopic History: -Colonoscopy (2017): Unsure of results -EGD (09/01/2019): 3 cm HH, ulcerative antritis. Started on Protonix and stopped NSAIDs (was taking ibuprofen for dental work) - EGD (11/04/2020, Dr. Bryan Lemma): Regular Z-line, Hill grade 2, fundic gland polyps, mild antral gastritis.  Normal duodenum (biopsies negative for Celiac) - Colonoscopy (11/04/2020, Dr. Bryan Lemma): Cecal lipoma, internal hemorrhoids, otherwise normal.  Repeat 10 years  -pH/impedance testing: Declined -Esophageal Manometry: None  HPI:     Patient is a 65 y.o. female presenting  to the Gastroenterology Clinic for follow-up.  She was last seen by me on 09/29/2020.  Was still having intermittent breakthrough reflux despite taking Prilosec 40 mg/day.  Breakthrough typically occurs with exercise, particularly walking on inclines.  Since last appointment, was evaluated with EGD and colonoscopy earlier this month for evaluation of reflux and iron deficiency.  Recommended increasing Prilosec to 40 mg BID x4 weeks (she is still only taking as daily).  Was planning on Esophageal Manometry and pH/impedance testing (off PPI), but patient elected to postpone due to current COVID surge, and today ultimately declined.   Today, she states has not been working out, so no recent breakthrough sxs. Only taking Prilosec daily, but would like to trial BID dosing. Was seen by her dentist in [place] and started on oral solution to increase salivary production.     Review of systems:     No chest pain, no SOB, no fevers, no urinary sx   Past Medical History:  Diagnosis Date  . Allergy    seasonal allergies  . Anemia    hx of  . Carotid atherosclerosis   . Gastric ulcer    hx of  . GERD (gastroesophageal reflux disease)    on meds  . Growth hormone deficiency ([place])   . Hiatal hernia   . Hiatal hernia   . Hypothyroidism    hx of Hashimoto thyroiditis  . Pituitary adenoma ([place])    hx of  . Seizures ([place])    temporal lobe  . Thyroid disease    on meds  . Vitamin D deficiency     Patient's surgical history, family medical history, social history, medications and allergies were all reviewed in Epic    Current Outpatient Medications  Medication Sig Dispense Refill  . cholecalciferol (VITAMIN D3) 25 MCG (1000  UNIT) tablet Take 1,000 Units by mouth daily. (Patient not taking: Reported on 11/04/2020)    . desonide (DESOWEN) 0.05 % cream Apply 1 application topically as needed.  (Patient not taking: Reported on 11/04/2020)    . estradiol (ESTRACE) 0.1 MG/GM vaginal cream Place vaginally.  (Patient not taking: Reported on 11/04/2020)    . fluticasone (FLONASE) 50 MCG/ACT nasal spray Place into the nose daily as needed. (Patient not taking: Reported on 11/04/2020)    . LAMICTAL 100 MG tablet Take 1 tablet (100 mg total) by mouth 2 (two) times daily. 180 tablet 0  . levothyroxine (SYNTHROID) 50 MCG tablet Take 50 mcg by mouth daily before breakfast. 75 Mcg every Sunday    . metFORMIN (GLUCOPHAGE-XR) 750 MG 24 hr tablet Take by mouth. (Patient not taking: Reported on 11/04/2020)    . omeprazole (PRILOSEC OTC) 20 MG tablet Take 2 tablets (40 mg total) by mouth 2 (two) times daily. Reduce prilosec to 40 mg daily after 4 weeks 60 tablet 3  . rosuvastatin (CRESTOR) 10 MG tablet Take 10 mg by mouth at bedtime.    . silver sulfADIAZINE (SILVADENE) 1 % cream  (Patient not taking: Reported on 11/04/2020)     No current facility-administered medications for this visit.    Physical Exam:     There were no vitals taken for this visit.  GENERAL:  Pleasant female in NAD PSYCH: : Cooperative, normal affect Musculoskeletal:  Normal muscle tone, normal strength NEURO: Alert and oriented x 3, no focal neurologic deficits   IMPRESSION and PLAN:    1) GERD 2) LES laxity - Trial Prilosec 40 BID (Brand name only per patient request) x4 weeks, then reduce to lowest effective dose - Will trial course of Peppermint oil for possible hypersensitivity overlap. Will start by taking prior to exercise as a trial - Discussed neuromodulation with TCA or SNRI; ultimately does not want this at this juncture - Doesn't want surgery or surgical w/u. - If planning any antireflux surgical options, would need Esophageal Manometry along with pH/impedance testing - Reviewed images on her phone of prior EGD from 2020; LES laxity and hiatal hernia noted. No HH on the most recent EGD with me. Discussed sliding hernia vs resolution (less common). Ultimately, does not want lap HH repair - Again, discussed establishing ultimate  dx with pH/Impedance testing, which she declines. Discussed [place], and again deferred  3) Iron deficiency without anemia - No clear etiology on EGD/colonoscopy.  Biopsies negative for Celiac Disease - Can check celiac serologies for confirmation - Iron rich diet - Does not want VCE and wants to hold off on supplemental iron - Repeat Iron panel and CBC in 3 months. If still deficient, or if developing anemia, will plan VCE  RTC in 3 months or sooner prn  I spent 35 minutes of time, including in depth chart review, independent review of results as outlined above, communicating results with the patient directly, face-to-face time wihth the patient, coordinating care, and ordering studies and medications as appropriate, and documentation.           [place] ,DO, FACG 11/16/2020, 1:21 PM

## 2020-11-16 NOTE — Patient Instructions (Addendum)
If you are age 63 or older, your body mass index should be between 23-30. Your Body mass index is 26.61 kg/m. If this is out of the aforementioned range listed, please consider follow up with your Primary Care Provider.  If you are age 75 or younger, your body mass index should be between 19-25. Your Body mass index is 26.61 kg/m. If this is out of the aformentioned range listed, please consider follow up with your Primary Care Provider.   We have sent the following medications to your pharmacy for you to pick up at your convenience:Prilosec 40 mg take twice daily for 4 weeks and then start once daily.  Follow iron rich diet   Start pepper oil as needed    Follow up in 3 months    Iron-Rich Diet  Iron is a mineral that helps your body to produce hemoglobin. Hemoglobin is a protein in red blood cells that carries oxygen to your body's tissues. Eating too little iron may cause you to feel weak and tired, and it can increase your risk of infection. Iron is naturally found in many foods, and many foods have iron added to them (iron-fortified foods). You may need to follow an iron-rich diet if you do not have enough iron in your body due to certain medical conditions. The amount of iron that you need each day depends on your age, your sex, and any medical conditions you have. Follow instructions from your health care provider or a diet and nutrition specialist (dietitian) about how much iron you should eat each day. What are tips for following this plan? Reading food labels  Check food labels to see how many milligrams (mg) of iron are in each serving. Cooking  Cook foods in pots and pans that are made from iron.  Take these steps to make it easier for your body to absorb iron from certain foods: ? Soak beans overnight before cooking. ? Soak whole grains overnight and drain them before using. ? Ferment flours before baking, such as by using yeast in bread dough. Meal planning  When you  eat foods that contain iron, you should eat them with foods that are high in vitamin C. These include oranges, peppers, tomatoes, potatoes, and mango. Vitamin C helps your body to absorb iron. General information  Take iron supplements only as told by your health care provider. An overdose of iron can be life-threatening. If you were prescribed iron supplements, take them with orange juice or a vitamin C supplement.  When you eat iron-fortified foods or take an iron supplement, you should also eat foods that naturally contain iron, such as meat, poultry, and fish. Eating naturally iron-rich foods helps your body to absorb the iron that is added to other foods or contained in a supplement.  Certain foods and drinks prevent your body from absorbing iron properly. Avoid eating these foods in the same meal as iron-rich foods or with iron supplements. These foods include: ? Coffee, black tea, and red wine. ? Milk, dairy products, and foods that are high in calcium. ? Beans and soybeans. ? Whole grains. What foods should I eat? Fruits Prunes. Raisins. Eat fruits high in vitamin C, such as oranges, grapefruits, and strawberries, alongside iron-rich foods. Vegetables Spinach (cooked). Green peas. Broccoli. Fermented vegetables. Eat vegetables high in vitamin C, such as leafy greens, potatoes, bell peppers, and tomatoes, alongside iron-rich foods. Grains Iron-fortified breakfast cereal. Iron-fortified whole-wheat bread. Enriched rice. Sprouted grains. Meats and other proteins Beef liver. Oysters.  Beef. Shrimp. [place]. Chicken. [place]. Sardines. Chickpeas. Nuts. Tofu. Pumpkin seeds. Beverages Tomato juice. Fresh orange juice. Prune juice. Hibiscus tea. Fortified instant breakfast shakes. Sweets and desserts Blackstrap molasses. Seasonings and condiments Tahini. Fermented soy sauce. Other foods Wheat germ. The items listed above may not be a complete list of recommended foods and beverages. Contact  a dietitian for more information. What foods should I avoid? Grains Whole grains. Bran cereal. Bran flour. Oats. Meats and other proteins Soybeans. Products made from soy protein. Black beans. Lentils. Mung beans. Split peas. Dairy Milk. Cream. Cheese. Yogurt. Cottage cheese. Beverages Coffee. Black tea. Red wine. Sweets and desserts Cocoa. Chocolate. Ice cream. Other foods Basil. Oregano. Large amounts of parsley. The items listed above may not be a complete list of foods and beverages to avoid. Contact a dietitian for more information. Summary  Iron is a mineral that helps your body to produce hemoglobin. Hemoglobin is a protein in red blood cells that carries oxygen to your body's tissues.  Iron is naturally found in many foods, and many foods have iron added to them (iron-fortified foods).  When you eat foods that contain iron, you should eat them with foods that are high in vitamin C. Vitamin C helps your body to absorb iron.  Certain foods and drinks prevent your body from absorbing iron properly, such as whole grains and dairy products. You should avoid eating these foods in the same meal as iron-rich foods or with iron supplements. This information is not intended to replace advice given to you by your health care provider. Make sure you discuss any questions you have with your health care provider. Document Revised: 09/28/2017 Document Reviewed: 09/11/2017 Elsevier Patient Education  2021 [place].    Thank you for choosing me and [facility].  Gerrit Heck , MD

## 2020-11-17 ENCOUNTER — Ambulatory Visit (HOSPITAL_COMMUNITY): Admit: 2020-11-17 | Payer: BC Managed Care – PPO | Admitting: Gastroenterology

## 2020-11-17 ENCOUNTER — Encounter (HOSPITAL_COMMUNITY): Payer: Self-pay

## 2020-11-17 SURGERY — MANOMETRY, ESOPHAGUS

## 2020-11-23 ENCOUNTER — Ambulatory Visit: Payer: BC Managed Care – PPO

## 2020-11-24 ENCOUNTER — Other Ambulatory Visit: Payer: Self-pay

## 2020-11-24 ENCOUNTER — Ambulatory Visit (INDEPENDENT_AMBULATORY_CARE_PROVIDER_SITE_OTHER): Payer: BC Managed Care – PPO

## 2020-11-24 DIAGNOSIS — E538 Deficiency of other specified B group vitamins: Secondary | ICD-10-CM | POA: Diagnosis not present

## 2020-11-24 MED ORDER — CYANOCOBALAMIN 1000 MCG/ML IJ SOLN
1000.0000 ug | Freq: Once | INTRAMUSCULAR | Status: AC
  Administered 2020-11-24: 1000 ug via INTRAMUSCULAR

## 2020-11-24 NOTE — Progress Notes (Signed)
Per orders of Dr. Bryan Lemma, pt received B12 injection in Left deltoid, pt tolerated injection well, pt is to return in about 1 month for next injection

## 2020-11-26 DIAGNOSIS — N952 Postmenopausal atrophic vaginitis: Secondary | ICD-10-CM | POA: Diagnosis not present

## 2020-11-26 DIAGNOSIS — N3020 Other chronic cystitis without hematuria: Secondary | ICD-10-CM | POA: Diagnosis not present

## 2020-12-07 ENCOUNTER — Ambulatory Visit: Payer: BC Managed Care – PPO | Admitting: Sports Medicine

## 2020-12-10 DIAGNOSIS — R079 Chest pain, unspecified: Secondary | ICD-10-CM | POA: Diagnosis not present

## 2020-12-13 DIAGNOSIS — K5900 Constipation, unspecified: Secondary | ICD-10-CM | POA: Diagnosis not present

## 2020-12-13 DIAGNOSIS — R569 Unspecified convulsions: Secondary | ICD-10-CM | POA: Diagnosis not present

## 2020-12-13 DIAGNOSIS — Z8249 Family history of ischemic heart disease and other diseases of the circulatory system: Secondary | ICD-10-CM | POA: Diagnosis not present

## 2020-12-13 DIAGNOSIS — R140 Abdominal distension (gaseous): Secondary | ICD-10-CM | POA: Diagnosis not present

## 2020-12-15 DIAGNOSIS — R140 Abdominal distension (gaseous): Secondary | ICD-10-CM | POA: Diagnosis not present

## 2020-12-15 DIAGNOSIS — Z6828 Body mass index (BMI) 28.0-28.9, adult: Secondary | ICD-10-CM | POA: Diagnosis not present

## 2020-12-15 DIAGNOSIS — K219 Gastro-esophageal reflux disease without esophagitis: Secondary | ICD-10-CM | POA: Diagnosis not present

## 2020-12-15 DIAGNOSIS — K5900 Constipation, unspecified: Secondary | ICD-10-CM | POA: Diagnosis not present

## 2020-12-15 DIAGNOSIS — E162 Hypoglycemia, unspecified: Secondary | ICD-10-CM | POA: Diagnosis not present

## 2020-12-15 DIAGNOSIS — E559 Vitamin D deficiency, unspecified: Secondary | ICD-10-CM | POA: Diagnosis not present

## 2020-12-15 DIAGNOSIS — E611 Iron deficiency: Secondary | ICD-10-CM | POA: Diagnosis not present

## 2020-12-15 DIAGNOSIS — K449 Diaphragmatic hernia without obstruction or gangrene: Secondary | ICD-10-CM | POA: Diagnosis not present

## 2020-12-16 ENCOUNTER — Ambulatory Visit: Payer: BC Managed Care – PPO | Admitting: Sports Medicine

## 2020-12-21 ENCOUNTER — Ambulatory Visit: Payer: BC Managed Care – PPO

## 2020-12-23 ENCOUNTER — Ambulatory Visit: Payer: BC Managed Care – PPO

## 2020-12-26 NOTE — Progress Notes (Signed)
Primary Physician/Referring:  Ronnald Nian, DO  Patient ID: Kim Kim, female    DOB: [birth year], 65 y.o.   MRN: [id number]  Chief Complaint  Patient presents with  . Chest Pain  . New Patient (Initial Visit)    Referred by Deland Pretty, MD   HPI:    Kim Kim  is a 65 y.o. Caucasian femalewith a history of GERD, B12 deficiency, hiatal hernia, PUD with gastric ulcer in 2020 due to NSAID, history of pituitary adenoma, seizure disorder, history of Hashimoto's thyroiditis with subsequent hypothyroidism, hyperlipidemia, diabetes mellitus, family history of premature coronary artery disease, migrated from [place], [place], prior to moving to [place].  She was previously evaluated by Dr. Quay Burow in 2020. She has had prior cardiac evaluation while in [place], [place]. Due to recurrence of chest pain she is referred to me for second opinion.  Patient has been having exertional chest pain for the past several years.  She only has anginal symptoms when she exerts above and beyond her usual activity.  She and her husband are fairly active, she likes to exercise regularly and also height.  As she continues to have significant chest pain this is bothered her.  She has had extensive evaluation in the past and she is frustrated that she is unable to get relief.  GI work-up has been negative with regard to hiatal hernia and work-up of GERD.  She denies any symptoms to suggest GERD or heartburn.  Past Medical History:  Diagnosis Date  . Allergy    seasonal allergies  . Anemia    hx of  . Carotid atherosclerosis   . Gastric ulcer    hx of  . GERD (gastroesophageal reflux disease)    on meds  . Growth hormone deficiency ([place])   . Hiatal hernia   . Hiatal hernia   . Hypothyroidism    hx of Hashimoto thyroiditis  . Pituitary adenoma ([place])    hx of  . Seizures ([place])    temporal lobe  . Thyroid disease    on meds  . Vitamin D deficiency    Past Surgical History:  Procedure Laterality  Date  . COLONOSCOPY  12/10/2015   [place] Dr Meredith Leeds  . ESOPHAGOGASTRODUODENOSCOPY  06/03/2019   Dr Celso Sickle, [place]  . WISDOM TOOTH EXTRACTION     Family History  Problem Relation Age of Onset  . Heart disease Mother   . Diabetes Mother   . Thyroid disease Mother        hypo  . Heart disease Father   . Hypertension Father   . Esophageal cancer Neg Hx   . Colon cancer Neg Hx   . Colon polyps Neg Hx   . Stomach cancer Neg Hx   . Rectal cancer Neg Hx     Social History   Tobacco Use  . Smoking status: Never Smoker  . Smokeless tobacco: Never Used  Substance Use Topics  . Alcohol use: Yes    Comment: socially   Marital Status: Married  ROS  Review of Systems  Cardiovascular: Positive for chest pain. Negative for dyspnea on exertion and leg swelling.  Gastrointestinal: Negative for melena.   Objective  Blood pressure (!) 147/84, pulse 93, temperature 98.8 F (37.1 C), temperature source Temporal, resp. rate 16, height 5' 4"  (1.626 m), weight 170 lb 3.2 oz (77.2 kg), SpO2 99 %.  Vitals with BMI 12/27/2020 11/16/2020 11/04/2020  Height 5' 4"  5' 4"  -  Weight 170 lbs 3  oz 155 lbs -  BMI 16.1 09.60 -  Systolic 454 098 119  Diastolic 84 70 87  Pulse 93 75 67     Physical Exam Cardiovascular:     Rate and Rhythm: Normal rate and regular rhythm.     Pulses: Intact distal pulses.     Heart sounds: Normal heart sounds. No murmur heard. No gallop.      Comments: No leg edema, no JVD. Pulmonary:     Effort: Pulmonary effort is normal.     Breath sounds: Normal breath sounds.  Abdominal:     General: Bowel sounds are normal.     Palpations: Abdomen is soft.    Laboratory examination:   Recent Labs    01/07/20 1138  K 4.4   CrCl cannot be calculated (No successful lab value found.).  CMP Latest Ref Rng & Units 01/07/2020  Potassium 3.5 - 5.1 mEq/L 4.4   CBC Latest Ref Rng & Units 09/29/2020 01/07/2020  WBC 4.0 - 10.5 K/uL 7.1 7.0  Hemoglobin 12.0 - 15.0 g/dL  12.0 12.5  Hematocrit 36.0 - 46.0 % 36.1 37.6  Platelets 150.0 - 400.0 K/uL 242.0 250.0    Lipid Panel No results for input(s): CHOL, TRIG, LDLCALC, VLDL, HDL, CHOLHDL, LDLDIRECT in the last 8760 hours.  HEMOGLOBIN A1C Lab Results  Component Value Date   HGBA1C 6.1 09/06/2020   TSH Recent Labs    01/07/20 1119 09/06/20 0849  TSH 2.11 2.27    External labs:    Labs 10/25/2020:  Total cholesterol 148, triglycerides 314, HDL 47, LDL 38.  Non-HDL cholesterol 101.  Sodium 141, potassium 4.4, serum chloride 105, serum glucose 98 mg, BUN 13, creatinine 0.84, EGFR 75 mL.  Hb 12.7/HCT 39.7, platelets 250.  A1c 5.9%.  A1C 6.100 % 09/06/2020 TSH 2.270 09/06/2020  Hemoglobin 12.000 g/d 09/29/2020 Platelets 242.000 K/ 09/29/2020   Potassium 4.400 mEq 01/07/2020   Medications and allergies  No Known Allergies   Outpatient Medications Prior to Visit  Medication Sig Dispense Refill  . desonide (DESOWEN) 0.05 % cream Apply 1 application topically as needed.    Marland Kitchen estradiol (ESTRACE) 0.1 MG/GM vaginal cream Place vaginally.    . fluticasone (FLONASE) 50 MCG/ACT nasal spray Place into the nose daily as needed.    Marland Kitchen LAMICTAL 100 MG tablet Take 1 tablet (100 mg total) by mouth 2 (two) times daily. 180 tablet 0  . levothyroxine (SYNTHROID) 50 MCG tablet Take 50 mcg by mouth daily before breakfast. 75 Mcg every Sunday    . omeprazole (PRILOSEC) 40 MG capsule Take 1 capsule (40 mg total) by mouth in the morning and at bedtime. Dispense as brand name Prilosec per patient request. 60 capsule 3  . rosuvastatin (CRESTOR) 10 MG tablet Take 10 mg by mouth at bedtime.    . silver sulfADIAZINE (SILVADENE) 1 % cream As needed    . cholecalciferol (VITAMIN D3) 25 MCG (1000 UNIT) tablet Take 1,000 Units by mouth daily.    . metFORMIN (GLUCOPHAGE-XR) 750 MG 24 hr tablet Take by mouth. (Patient not taking: No sig reported)    . omeprazole (PRILOSEC OTC) 20 MG tablet Take 40 mg by mouth daily.     No  facility-administered medications prior to visit.    Radiology:   Chest x-ray PA and lateral view 12/10/2020: Heart and mediastinal contours are within normal limits.  Both lungs are clear.  Cardiac Studies:   Calcium score 11/13/17: Total score 0.  Carotid duplex, 01/08/18: Minimal atherosclerotic plaque  in the carotid artery bifurcations bilaterally  Stress echo, 05/01/18: She completed 7 minutes, 45 seconds, 8.4 METs, no ischemia.  Coronary CT angiogram 12/23/2018: Normal coronary arteries. Right dominant circulation. Coronary calcium score not done. (Calcium score of 0 on 11/13/2017).   Echocardiogram 02/12/2019:    1. The left ventricle has normal systolic function, with an ejection fraction of 55-60%. The cavity size was normal. Left ventricular diastolic Doppler parameters are consistent with impaired relaxation. No evidence of left ventricular regional wall  motion abnormalities.  2. The right ventricle has normal systolic function. The cavity was normal. There is no increase in right ventricular wall thickness.  3. The aortic valve is tricuspid.  4. The aortic root is normal in size and structure.  EKG:     Labs 12/19/2020: Normal sinus rhythm at rate of 76 beats minute, normal axis.  Incomplete right bundle branch block.  No evidence of ischemia, normal EKG.  EKG 01/10/2019: Normal sinus rhythm at the rate of 74 bpm.  Assessment     ICD-10-CM   1. Angina pectoris with normal coronary arteriogram (HCC)  I20.9 EKG 12-Lead    arginine 500 MG tablet    Cardiopulmonary exercise test  2. Mixed hyperlipidemia  E78.2 Lipid Panel With LDL/HDL Ratio    Lipid Panel With LDL/HDL Ratio  3. Family history of heart disease. Mother with CAD late 104s and Brother at age 88 Y  Z36.49   90. Hyperglycemia  R73.9     Medications Discontinued During This Encounter  Medication Reason  . cholecalciferol (VITAMIN D3) 25 MCG (1000 UNIT) tablet Error  . metFORMIN (GLUCOPHAGE-XR) 750 MG 24 hr  tablet Error  . omeprazole (PRILOSEC OTC) 20 MG tablet Error    Meds ordered this encounter  Medications  . arginine 500 MG tablet    Sig: Take 1 tablet (500 mg total) by mouth daily.    Dispense:  60 tablet    Refill:  2   Orders Placed This Encounter  Procedures  . Cardiopulmonary exercise test    Standing Status:   Future    Standing Expiration Date:   12/27/2021  . Lipid Panel With LDL/HDL Ratio    Standing Status:   Future    Number of Occurrences:   1    Standing Expiration Date:   03/26/2021  . EKG 12-Lead   Recommendations:   Kim Kim is a 66 y.o. Caucasian femalewith a history of GERD, B12 deficiency, hiatal hernia, PUD with gastric ulcer in 2020 due to NSAID, history of pituitary adenoma, seizure disorder, history of Hashimoto's thyroiditis with subsequent hypothyroidism, hyperlipidemia, diabetes mellitus, family history of premature coronary artery disease, migrated from [place], [place], prior to moving to [place].  She was previously evaluated by Dr. Quay Burow in 2020. She has had prior cardiac evaluation while in [place], [place]. Due to recurrence of chest pain she is referred to me for second opinion.  I reviewed all her medical records, she has normal coronary arteries per coronary CTA.  Other option would be to proceed with coronary angiography and perform tissue perfusion study with Dopplers.  However before proceeding with invasive approach, I would like her to try arginine for supplements to see whether endothelial function can be improved upon.  We could also place her on isosorbide mononitrate.  Since moving to [facility] she has gained weight and her triglycerides have risen as well.  She is now prediabetic.  I encouraged her to restart Metformin.  She is going to try  to make lifestyle changes, I would like to repeat her lipids and if triglycerides are still elevated we may have to target triglycerides especially in view of angina pectoris.  Although there is  family history, it is very soft and there is no strong family still premature coronary disease.  With regard to this I will simply reassured her.  I also discussed with her that her anginal symptoms are life altering but not life-threatening.  I will also set her up for a cardiopulmonary stress test to evaluate for any functional limitation with regard to pulmonary status.    I will see her back in 3 months for follow-up.  This was a 60-minute office visit encounter with evaluation of external records, updating her labs and discussions regarding multiple medical issues.     Adrian Prows, MD, [facility] 12/27/2020, 10:27 AM Office: [phone]

## 2020-12-27 ENCOUNTER — Other Ambulatory Visit: Payer: Self-pay

## 2020-12-27 ENCOUNTER — Encounter: Payer: Self-pay | Admitting: Cardiology

## 2020-12-27 ENCOUNTER — Ambulatory Visit: Payer: BC Managed Care – PPO | Admitting: Cardiology

## 2020-12-27 VITALS — BP 147/84 | HR 93 | Temp 98.8°F | Resp 16 | Ht 64.0 in | Wt 170.2 lb

## 2020-12-27 DIAGNOSIS — I209 Angina pectoris, unspecified: Secondary | ICD-10-CM

## 2020-12-27 DIAGNOSIS — Z8249 Family history of ischemic heart disease and other diseases of the circulatory system: Secondary | ICD-10-CM | POA: Diagnosis not present

## 2020-12-27 DIAGNOSIS — E782 Mixed hyperlipidemia: Secondary | ICD-10-CM

## 2020-12-27 DIAGNOSIS — R739 Hyperglycemia, unspecified: Secondary | ICD-10-CM | POA: Diagnosis not present

## 2020-12-27 MED ORDER — ARGININE 500 MG PO TABS: 500.0000 mg | ORAL_TABLET | Freq: Every day | ORAL | 2 refills | Status: DC

## 2020-12-28 ENCOUNTER — Encounter: Payer: Self-pay | Admitting: Sports Medicine

## 2020-12-28 ENCOUNTER — Ambulatory Visit (INDEPENDENT_AMBULATORY_CARE_PROVIDER_SITE_OTHER): Payer: BC Managed Care – PPO | Admitting: Sports Medicine

## 2020-12-28 ENCOUNTER — Ambulatory Visit (INDEPENDENT_AMBULATORY_CARE_PROVIDER_SITE_OTHER): Payer: BC Managed Care – PPO

## 2020-12-28 VITALS — BP 122/84 | Ht 64.0 in | Wt 165.0 lb

## 2020-12-28 DIAGNOSIS — E538 Deficiency of other specified B group vitamins: Secondary | ICD-10-CM | POA: Diagnosis not present

## 2020-12-28 DIAGNOSIS — M1711 Unilateral primary osteoarthritis, right knee: Secondary | ICD-10-CM

## 2020-12-28 MED ORDER — CYANOCOBALAMIN 1000 MCG/ML IJ SOLN
1000.0000 ug | Freq: Once | INTRAMUSCULAR | Status: AC
  Administered 2020-12-28: 1000 ug via INTRAMUSCULAR

## 2020-12-28 NOTE — Progress Notes (Signed)
   PCP: Ronnald Nian, DO  Subjective:   HPI: Patient is a 65 y.o. female here for reevaluation of right knee pain.  She was last seen here about this on 09/23/2019, at that time was diagnosed with patellofemoral osteoarthritis.  She was referred for physical therapy which she did for an extended period time and reports that she did have benefit with this however she continues to have pain.  Currently, she is doing Pilates and has been using her compression sleeve which is somewhat helpful.  She is wondering if there is anything she can do to continue to improve her knee pain.  Currently, she has worsening pain if she goes on longer walks and has not been able to walk as much as she would like to.  She has not had any new trauma, injury or fall since her last visit.  She denies any numbness, tingling or weakness.   Review of Systems:  Per HPI.   [place], medications and smoking status reviewed.  She is seieng Dr. Nadyne Coombes for her anginal type pain.  I encouraged follow up with this and using medication to control symptoms.  Her scanning was not remarkable.      Objective:  Physical Exam:  No flowsheet data found.  BP 122/84   Ht 5\' 4"  (1.626 m)   Wt 165 lb (74.8 kg)   BMI 28.32 kg/m   Gen: awake, alert, NAD, comfortable in exam room Pulm: breathing unlabored  Right knee  Inspection: Bilateral knees without evidence of erythema, ecchymosis, swelling, edema. No significant effusion present  Active ROM: Limited 0-140d due to pain.  Passive ROM: Somewhat limited 0 to 150 degrees with pain at endpoint strength: 5/5 strength to resisted flexion/extension without pain  Patella: Positive patellar grind.  Mild patellar facet tenderness. No apprehension.  Mild proximal patellar tendon tenderness at insertion on patella. No quad tendon tenderness to palpation.  Tibia: No tibial plateau, tibial tuberosity tenderness.  Joint line: No joint line tenderness.  Popliteal: No popliteal tenderness to  the insertional gastroc. No insertional biceps femoris, semimembranosis, semitendinosis tenderness.  McMurrays: Negative bilaterally for pain, catching  Lachmans: Stable bilaterally with firm endpoint  Anterior/Posterior drawer: Stable bilaterally Varus/valgus stress at 0, 15d: Negative for pain, laxity  Hip abduction strength tested bilaterally, fairly weak bilaterally and slightly weaker on the right side.    Assessment & Plan:  1.  Right patellofemoral osteoarthritis Patient with physical exam findings most consistent with patellofemoral osteoarthritis, and ultrasound performed at last visit in 2020 showed this as well.  She continues to be fairly active but does have pain with activities and is wondering today thing else she can do.  We discussed this will be a chronic issue in pain management is the primary goal, though strengthening can help reduce pain as well.  Plan: -Hip abduction and quadricep strengthening exercises given today -Continue PT Pilates, and discussed that she should talk to them about her diagnosis of patellofemoral arthritis to get specific exercises recommendations -Discussed alternative therapies including turmeric, ice, relative rest from aggravating activities, and continuing compression -Follow-up as needed   Dagoberto Ligas, MD [facility] 12/28/2020 10:51 AM I observed and examined the patient with the [facility] resident and agree with assessment and plan.  Note reviewed and modified by me. Ila Mcgill, MD

## 2020-12-28 NOTE — Progress Notes (Addendum)
Per orders of Dr. Loletha Grayer pt is here for B12 injection, pt received injection in right deltoid. Pt tolerated injection well.

## 2021-01-04 ENCOUNTER — Encounter: Payer: Self-pay | Admitting: Diagnostic Neuroimaging

## 2021-01-04 ENCOUNTER — Ambulatory Visit (INDEPENDENT_AMBULATORY_CARE_PROVIDER_SITE_OTHER): Payer: BC Managed Care – PPO | Admitting: Diagnostic Neuroimaging

## 2021-01-04 VITALS — BP 122/76 | HR 68 | Ht 64.0 in | Wt 166.0 lb

## 2021-01-04 DIAGNOSIS — G40109 Localization-related (focal) (partial) symptomatic epilepsy and epileptic syndromes with simple partial seizures, not intractable, without status epilepticus: Secondary | ICD-10-CM | POA: Diagnosis not present

## 2021-01-04 MED ORDER — LAMICTAL 100 MG PO TABS: 100.0000 mg | ORAL_TABLET | Freq: Two times a day (BID) | ORAL | 4 refills | Status: DC

## 2021-01-04 NOTE — Progress Notes (Signed)
[facility]  PATIENT: Kim Kim DOB: Jun 04, 1956  REFERRING CLINICIAN: Ronnald Nian, DO HISTORY FROM: patient  REASON FOR VISIT: follow up   HISTORICAL  CHIEF COMPLAINT:  Chief Complaint  Patient presents with  . Epilepsy    Rm 7, one year FU Husband- Gene "doing well"    HISTORY OF PRESENT ILLNESS:   UPDATE (01/04/21, VRP): Since last visit, doing well. Symptoms are improved. No alleviating or aggravating factors. Tolerating meds (lamictal 100mg  twice a day).    UPDATE (12/30/19, VRP): Since last visit, doing well. Some reports of memory loss. Some stress. Also asking about intermittent eye pressure issues when laying face down with massages. No alleviating or aggravating factors. Tolerating lamictal 100mg  twice a day. Mild spells have resolved.    PRIOR HPI: 65 year old female here for evaluation of temporal procedures.  In patient's 51s or 39s she was having recurrent dream, feeling like she was a little girl a swing in an old house.  Patient was having intermittent episodes of this strange feeling in her head which she had a hard time describing.  Sometimes she would feel lightheadedness and faint.  In her 5s she had an episode at home where she almost passed out.  In 2007 she had an episode where apparently she turned pale in color and almost fainted.  No dj vu sensations.  She had 1 out of body sensation in high school.  In 2013 patient went to see a neurologist, had EEG which showed some left temporal lobe dysfunction but no clear epileptiform discharges.  Patient was started on topiramate but this caused personality changes and side effects.  She saw another neurologist who started patient on lamotrigine and this seemed to help suppress these episodes.  Patient did well for many years.  She had gone from having 2-3 episodes a month down to no episodes.  In 2020 patient moved from [place] to [place] and was under increased stress during Covid  pandemic and relocation.  In the last few months she has noticed some mild strain sensations in her head which remind her of her prior spells.  She feels a lightheadedness and strangeness in her head.  No convulsions or loss of consciousness.  Patient has been stable on lamotrigine 75 in the morning and 100 at night.  Now patient having approximately 2-3 episodes per week.  Episodes can last a few minutes at a time.  No prior head trauma, meningitis or encephalitis.  No family history of seizures.   REVIEW OF SYSTEMS: Full 14 system review of systems performed and negative with exception of: As per HPI.  ALLERGIES: No Known Allergies  HOME MEDICATIONS: Outpatient Medications Prior to Visit  Medication Sig Dispense Refill  . arginine 500 MG tablet Take 1 tablet (500 mg total) by mouth daily. 60 tablet 2  . desonide (DESOWEN) 0.05 % cream Apply 1 application topically as needed.    Marland Kitchen estradiol (ESTRACE) 0.1 MG/GM vaginal cream Place vaginally.    . fluticasone (FLONASE) 50 MCG/ACT nasal spray Place into the nose daily as needed.    Marland Kitchen LAMICTAL 100 MG tablet Take 1 tablet (100 mg total) by mouth 2 (two) times daily. 180 tablet 0  . levothyroxine (SYNTHROID) 50 MCG tablet Take 50 mcg by mouth daily before breakfast. 75 Mcg every Sunday    . omeprazole (PRILOSEC) 40 MG capsule Take 1 capsule (40 mg total) by mouth in the morning and at bedtime. Dispense as brand name Prilosec per patient  request. 60 capsule 3  . rosuvastatin (CRESTOR) 10 MG tablet Take 10 mg by mouth at bedtime.    . silver sulfADIAZINE (SILVADENE) 1 % cream As needed     No facility-administered medications prior to visit.    PAST MEDICAL HISTORY: Past Medical History:  Diagnosis Date  . Allergy    seasonal allergies  . Anemia    hx of  . Carotid atherosclerosis   . Gastric ulcer    hx of  . GERD (gastroesophageal reflux disease)    on meds  . Growth hormone deficiency ([place])   . Hiatal hernia   . Hiatal hernia   .  Hypothyroidism    hx of Hashimoto thyroiditis  . Pituitary adenoma ([place])    hx of  . Seizures ([place])    temporal lobe  . Thyroid disease    on meds  . Vitamin D deficiency     PAST SURGICAL HISTORY: Past Surgical History:  Procedure Laterality Date  . COLONOSCOPY  12/10/2015   [place] Meredith Leeds  . ESOPHAGOGASTRODUODENOSCOPY  06/03/2019   Dr Celso [place]ick[place]  . WISDOM TOOTH EXTRACTION      FAMILY HISTORY: Family History  Problem Relation Age of Onset  . Heart disease Mother   . Diabetes Mother   . Thyroid disease Mother        hypo  . Heart disease Father   . Hypertension Father   . Esophageal cancer Neg Hx   . Colon cancer Neg Hx   . Colon polyps Neg Hx   . Stomach cancer Neg Hx   . Rectal cancer Neg Hx     SOCIAL HISTORY: Social History   Socioeconomic History  . Marital status: Married    Spouse name: Gene  . Number of children: 3  . Years of education: Not on file  . Highest education level: Associate degree: occupational, Hotel manager, or vocational program  Occupational History    Comment: NA  Tobacco Use  . Smoking status: Never Smoker  . Smokeless tobacco: Never Used  Vaping Use  . Vaping Use: Never used  Substance and Sexual Activity  . Alcohol use: Yes    Comment: socially  . Drug use: Never  . Sexual activity: Yes    Birth control/protection: Post-menopausal  Other Topics Concern  . Not on file  Social History Narrative   She recently moved from [place]. 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   Caffeine- coffee maybe 1 c, some tea   Social Determinants of Radio broadcast assistant Strain: Not on file  Food Insecurity: Not on file  Transportation Needs: Not on file  Physical Activity: Not on file  Stress: Not on file  Social Connections: Not on file  Intimate Partner Violence: Not on file     PHYSICAL EXAM  GENERAL EXAM/CONSTITUTIONAL: Vitals:  Vitals:   01/04/21 0910  BP: 122/76  Pulse: 68   Weight: 166 lb (75.3 kg)  Height: 5\' 4"  (1.626 m)   Body mass index is 28.49 kg/m. Wt Readings from Last 3 Encounters:  01/04/21 166 lb (75.3 kg)  12/28/20 165 lb (74.8 kg)  12/27/20 170 lb 3.2 oz (77.2 kg)    Patient is in no distress; well developed, nourished and groomed; neck is supple  CARDIOVASCULAR:  Examination of carotid arteries is normal; no carotid bruits  Regular rate and rhythm, no murmurs  Examination of peripheral vascular system by observation and palpation is normal  EYES:  Ophthalmoscopic exam of optic discs and posterior segments is normal; no papilledema or hemorrhages No exam data present  MUSCULOSKELETAL:  Gait, strength, tone, movements noted in Neurologic exam below  NEUROLOGIC: MENTAL STATUS:  No flowsheet data found.  awake, alert, oriented to person, place and time  recent and remote memory intact  normal attention and concentration  language fluent, comprehension intact, naming intact  fund of knowledge appropriate  CRANIAL NERVE:   2nd - no papilledema on fundoscopic exam  2nd, 3rd, 4th, 6th - pupils equal and reactive to light, visual fields full to confrontation, extraocular muscles intact, no nystagmus  5th - facial sensation symmetric  7th - facial strength symmetric  8th - hearing intact  9th - palate elevates symmetrically, uvula midline  11th - shoulder shrug symmetric  12th - tongue protrusion midline  MOTOR:   normal bulk and tone, full strength in the BUE, BLE  SENSORY:   normal and symmetric to light touch, temperature, vibration  COORDINATION:   finger-nose-finger, fine finger movements normal  REFLEXES:   deep tendon reflexes present and symmetric  GAIT/STATION:   narrow based gait     DIAGNOSTIC DATA (LABS, IMAGING, TESTING) - I reviewed patient records, labs, notes, testing and imaging myself where available.  Lab Results  Component Value Date   WBC 7.1 09/29/2020   HGB 12.0  09/29/2020   HCT 36.1 09/29/2020   MCV 80.8 09/29/2020   PLT 242.0 09/29/2020      Component Value Date/Time   K 4.4 01/07/2020 1138   No results found for: CHOL, HDL, LDLCALC, LDLDIRECT, TRIG, CHOLHDL Lab Results  Component Value Date   HGBA1C 6.1 09/06/2020   Lab Results  Component Value Date   [id number] 09/06/2020   Lab Results  Component Value Date   TSH 2.27 09/06/2020     2013 MRI brain  -Unremarkable brain -Small 1 mm focus of hypoenhancement may represent microadenoma  2013 EEG -Left anterior temporal lobe dysfunction; no epileptiform discharges  2013 Long-term EEG -7-hour video EEG; low voltage occipital spikes possibly epileptiform -No clinical or electrographic seizures    ASSESSMENT AND PLAN  65 y.o. year old female here with intermittent spells of lightheadedness, strange feeling in head, diagnosed with possible temporal lobe seizures in 2013.   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 LAMICTAL 100 MG tablet    Sig: Take 1 tablet (100 mg total) by mouth 2 (two) times daily.    Dispense:  180 tablet    Refill:  4   Return in about 1 year (around 01/04/2022).    Penni Bombard, MD 0/03/2375, 2:83 AM Certified in Neurology, Neurophysiology and Neuroimaging  [facility] [address] [place], [place] [address] [phone]

## 2021-01-07 ENCOUNTER — Ambulatory Visit: Payer: BC Managed Care – PPO | Admitting: Cardiology

## 2021-01-10 DIAGNOSIS — R7303 Prediabetes: Secondary | ICD-10-CM | POA: Diagnosis not present

## 2021-01-12 DIAGNOSIS — R198 Other specified symptoms and signs involving the digestive system and abdomen: Secondary | ICD-10-CM | POA: Diagnosis not present

## 2021-01-13 ENCOUNTER — Other Ambulatory Visit: Payer: Self-pay

## 2021-01-13 ENCOUNTER — Ambulatory Visit (HOSPITAL_COMMUNITY): Payer: BC Managed Care – PPO | Attending: Cardiology

## 2021-01-13 DIAGNOSIS — N952 Postmenopausal atrophic vaginitis: Secondary | ICD-10-CM | POA: Diagnosis not present

## 2021-01-13 DIAGNOSIS — N3020 Other chronic cystitis without hematuria: Secondary | ICD-10-CM | POA: Diagnosis not present

## 2021-01-13 DIAGNOSIS — I209 Angina pectoris, unspecified: Secondary | ICD-10-CM | POA: Insufficient documentation

## 2021-01-14 NOTE — Progress Notes (Signed)
Cardiopulmonary stress test 01/13/2021: Exercise testing with gas exchange indicates a normal functional capacity when compared to matched sedentary norms. There is no clear cardiopulmonary limitation. VE/VCO2 slope is slightly elevated and could indicate increased pulmonary pressures during exercise. Note exercise induced LBBB. Normal BP response.   Exercise Time:  11:45  Speed (mph): 3.0    Grade (%): 15.0   RPE: 17  Reason stopped: Chest pain (9/10).  Additional symptoms: Dyspnea (3/10)

## 2021-01-28 ENCOUNTER — Encounter: Payer: Self-pay | Admitting: Cardiology

## 2021-01-28 ENCOUNTER — Ambulatory Visit: Payer: BC Managed Care – PPO | Admitting: Cardiology

## 2021-01-28 ENCOUNTER — Other Ambulatory Visit: Payer: Self-pay

## 2021-01-28 VITALS — BP 114/79 | HR 76 | Temp 97.5°F | Resp 16 | Ht 64.0 in | Wt 166.0 lb

## 2021-01-28 DIAGNOSIS — Z8249 Family history of ischemic heart disease and other diseases of the circulatory system: Secondary | ICD-10-CM

## 2021-01-28 DIAGNOSIS — I209 Angina pectoris, unspecified: Secondary | ICD-10-CM | POA: Diagnosis not present

## 2021-01-28 DIAGNOSIS — E782 Mixed hyperlipidemia: Secondary | ICD-10-CM

## 2021-01-28 DIAGNOSIS — K219 Gastro-esophageal reflux disease without esophagitis: Secondary | ICD-10-CM | POA: Diagnosis not present

## 2021-01-28 NOTE — Progress Notes (Signed)
Primary Physician/Referring:  Ronnald Nian, DO  Patient ID: Kim Kim, female    DOB: 1955/11/30, 65 y.o.   MRN: [id number]  Chief Complaint  Patient presents with  . Results    Pulmonary test   HPI:    Kim Kim  is a 65 y.o. Caucasian femalewith a history of GERD, B12 deficiency, hiatal hernia, PUD with gastric ulcer in 2020 due to NSAID, history of pituitary adenoma, seizure disorder, history of Hashimoto's thyroiditis with subsequent hypothyroidism, hyperlipidemia, diabetes mellitus, family history of premature coronary artery disease, migrated from [place], [place], prior to moving to [place].  She was previously evaluated by Dr. Quay Burow in 2020. She has had prior cardiac evaluation while in [place], [place].  Patient was originally referred to our office for second opinion regarding recurrence of chest pain last month.  Patient presents for follow-up of angina since starting arginine supplements as well as to discuss results of cardiopulmonary stress test.  No new symptomatology.   Patient has been having exertional chest pain for the past several years.  She only has anginal symptoms when she exerts above and beyond her usual activity.  She and her husband are fairly active, she likes to exercise regularly and also height.  As she continues to have significant chest pain this is bothered her.  She has had extensive evaluation in the past and she is frustrated that she is unable to get relief.  GI work-up has been negative with regard to hiatal hernia and work-up of GERD.  She denies any symptoms to suggest GERD or heartburn.  Past Medical History:  Diagnosis Date  . Allergy    seasonal allergies  . Anemia    hx of  . Carotid atherosclerosis   . Gastric ulcer    hx of  . GERD (gastroesophageal reflux disease)    on meds  . Growth hormone deficiency ([place])   . Hiatal hernia   . Hiatal hernia   . Hypothyroidism    hx of Hashimoto thyroiditis  . Pituitary adenoma ([place])     hx of  . Seizures ([place])    temporal lobe  . Thyroid disease    on meds  . Vitamin D deficiency    Past Surgical History:  Procedure Laterality Date  . COLONOSCOPY  12/10/2015   [place] Meredith Leeds  . ESOPHAGOGASTRODUODENOSCOPY  06/03/2019   Dr Celso [place]ick[place]  . WISDOM TOOTH EXTRACTION     Family History  Problem Relation Age of Onset  . Heart disease Mother   . Diabetes Mother   . Thyroid disease Mother        hypo  . Heart disease Father   . Hypertension Father   . Esophageal cancer Neg Hx   . Colon cancer Neg Hx   . Colon polyps Neg Hx   . Stomach cancer Neg Hx   . Rectal cancer Neg Hx     Social History   Tobacco Use  . Smoking status: Never Smoker  . Smokeless tobacco: Never Used  Substance Use Topics  . Alcohol use: Yes    Comment: socially   Marital Status: Married  ROS  Review of Systems  Cardiovascular: Positive for chest pain. Negative for dyspnea on exertion and leg swelling.  Gastrointestinal: Positive for heartburn. Negative for melena.   Objective  Blood pressure 114/79, pulse 76, temperature (!) 97.5 F (36.4 C), temperature source Temporal, resp. rate 16, height 5' 4"  (1.626 m), weight 166 lb (75.3 kg), SpO2  98 %.  Vitals with BMI 01/28/2021 01/04/2021 12/28/2020  Height 5' 4"  5' 4"  5' 4"   Weight 166 lbs 166 lbs 165 lbs  BMI 28.48 37.34 28.76  Systolic 811 572 620  Diastolic 79 76 84  Pulse 76 68 -     Physical Exam Cardiovascular:     Rate and Rhythm: Normal rate and regular rhythm.     Pulses: Intact distal pulses.     Heart sounds: Normal heart sounds. No murmur heard. No gallop.      Comments: No leg edema, no JVD. Pulmonary:     Effort: Pulmonary effort is normal.     Breath sounds: Normal breath sounds.  Abdominal:     General: Bowel sounds are normal.     Palpations: Abdomen is soft.    Laboratory examination:   No results for input(s): NA, K, CL, CO2, GLUCOSE, BUN, CREATININE, CALCIUM, GFRNONAA, GFRAA in the last  8760 hours. CrCl cannot be calculated (No successful lab value found.).  CMP Latest Ref Rng & Units 01/07/2020  Potassium 3.5 - 5.1 mEq/L 4.4   CBC Latest Ref Rng & Units 09/29/2020 01/07/2020  WBC 4.0 - 10.5 K/uL 7.1 7.0  Hemoglobin 12.0 - 15.0 g/dL 12.0 12.5  Hematocrit 36.0 - 46.0 % 36.1 37.6  Platelets 150.0 - 400.0 K/uL 242.0 250.0    Lipid Panel No results for input(s): CHOL, TRIG, LDLCALC, VLDL, HDL, CHOLHDL, LDLDIRECT in the last 8760 hours.  HEMOGLOBIN A1C Lab Results  Component Value Date   HGBA1C 6.1 09/06/2020   TSH Recent Labs    09/06/20 0849  TSH 2.27    External labs:    10/25/2020:   Total cholesterol 148, triglycerides 314, HDL 47, LDL 38.  Non-HDL cholesterol 101.  Sodium 141, potassium 4.4, serum chloride 105, serum glucose 98 mg, BUN 13, creatinine 0.84, EGFR 75 mL.  Hb 12.7/HCT 39.7, platelets 250.  A1C 6.100 % 09/06/2020 TSH 2.270 09/06/2020  Hemoglobin 12.000 g/d 09/29/2020 Platelets 242.000 K/ 09/29/2020  Potassium 4.400 mEq 01/07/2020   Medications and allergies  No Known Allergies   Outpatient Medications Prior to Visit  Medication Sig Dispense Refill  . arginine 500 MG tablet Take 1 tablet (500 mg total) by mouth daily. 60 tablet 2  . desonide (DESOWEN) 0.05 % cream Apply 1 application topically as needed.    Marland Kitchen estradiol (ESTRACE) 0.1 MG/GM vaginal cream Place vaginally.    . fluticasone (FLONASE) 50 MCG/ACT nasal spray Place into the nose daily as needed.    Marland Kitchen LAMICTAL 100 MG tablet Take 1 tablet (100 mg total) by mouth 2 (two) times daily. 180 tablet 4  . levothyroxine (SYNTHROID) 50 MCG tablet Take 50 mcg by mouth daily before breakfast. 75 Mcg every Sunday    . omeprazole (PRILOSEC) 40 MG capsule Take 1 capsule (40 mg total) by mouth in the morning and at bedtime. Dispense as brand name Prilosec per patient request. 60 capsule 3  . rosuvastatin (CRESTOR) 10 MG tablet Take 10 mg by mouth at bedtime.    . silver sulfADIAZINE (SILVADENE)  1 % cream As needed     No facility-administered medications prior to visit.    Radiology:   Chest x-ray PA and lateral view 12/10/2020: Heart and mediastinal contours are within normal limits.  Both lungs are clear.  Cardiac Studies:   Calcium score 11/13/17: Total score 0.  Carotid duplex, 01/08/18: Minimal atherosclerotic plaque in the carotid artery bifurcations bilaterally  Stress echo, 05/01/18: She completed 7 minutes, 45  seconds, 8.4 METs, no ischemia.  Coronary CT angiogram 12/23/2018: Normal coronary arteries. Right dominant circulation. Coronary calcium score not done. (Calcium score of 0 on 11/13/2017).  Echocardiogram 02/12/2019:    1. The left ventricle has normal systolic function, with an ejection fraction of 55-60%. The cavity size was normal. Left ventricular diastolic Doppler parameters are consistent with impaired relaxation. No evidence of left ventricular regional wall  motion abnormalities.  2. The right ventricle has normal systolic function. The cavity was normal. There is no increase in right ventricular wall thickness.  3. The aortic valve is tricuspid.  4. The aortic root is normal in size and structure.  Cardiopulmonary stress test 01/13/2021: Exercise testing with gas exchange indicates a normal functional capacity when compared to matched sedentary norms. There is no clear cardiopulmonary limitation. VE/VCO2 slope is slightly elevated and could indicate increased pulmonary pressures during exercise. Note exercise induced LBBB. Normal BP response.   Exercise Time:  11:45  Speed (mph): 3.0    Grade (%): 15.0   RPE: 17  Reason stopped: Chest pain (9/10). Additional symptoms: Dyspnea (3/10)    EKG:   EKG 12/19/2020: Normal sinus rhythm at rate of 76 beats minute, normal axis.  Incomplete right bundle branch block.  No evidence of ischemia, normal EKG.  EKG 01/10/2019: Normal sinus rhythm at the rate of 74 bpm.  Assessment     ICD-10-CM   1.  Angina pectoris with normal coronary arteriogram (HCC)  I20.9   2. Mixed hyperlipidemia  E78.2   3. Gastroesophageal reflux disease without esophagitis  K21.9   4. Family history of heart disease. Mother with CAD late 26s and Brother at age 3 Y  Z17.49     Medications Discontinued During This Encounter  Medication Reason  . silver sulfADIAZINE (SILVADENE) 1 % cream Error    No orders of the defined types were placed in this encounter.  No orders of the defined types were placed in this encounter.  Recommendations:   Kim Kim is a 65 y.o. Caucasian femalewith a history of GERD, B12 deficiency, hiatal hernia, PUD with gastric ulcer in 2020 due to NSAID, history of pituitary adenoma, seizure disorder, history of Hashimoto's thyroiditis with subsequent hypothyroidism, hyperlipidemia, diabetes mellitus, family history of premature coronary artery disease, migrated from [place], [place], prior to moving to [place]. Patient presents for follow-up of angina since starting arginine supplements as well as to discuss results of cardiopulmonary stress test.   I reviewed her cardiopulmonary stress test extensively with the patient, although she developed rate related left bundle branch block there was no ST-T wave changes of ischemia immediately after conversion back to sinus rhythm with normal conduction.  Also I suspect her chest pain symptoms are probably related to GERD as she is now taking multiple supplements for what appears to be H. pylori infection.  Advised her that even if she has microvascular disease the only therapy available would be to do risk factor modification and regular exercise.  As long as lipids are controlled, she is exercising regularly, that the overall cardiovascular risk would be low.  This is especially in view of all her prior examination and evaluations that were done in the past including coronary CTA.  Although microvascular angina is not completely excluded, advised her to  push herself and reassured her stating that her symptoms are life altering but not life-threatening.  I also discussed with her regarding triglyceride elevation, she states that her diet has been poor since moving [place] from  [place], she will make lifestyle changes, recheck in 6 months and she can certainly consider going on omega-3/Vascepa if triglycerides elevated.  I will see her back on a as needed basis.  This was a 40-minute encounter.    Adrian Prows, MD, [facility] 01/28/2021, 4:51 PM Office: [phone]

## 2021-01-31 ENCOUNTER — Encounter (HOSPITAL_COMMUNITY): Payer: BC Managed Care – PPO

## 2021-02-28 DIAGNOSIS — R7303 Prediabetes: Secondary | ICD-10-CM | POA: Diagnosis not present

## 2021-02-28 DIAGNOSIS — R198 Other specified symptoms and signs involving the digestive system and abdomen: Secondary | ICD-10-CM | POA: Diagnosis not present

## 2021-02-28 DIAGNOSIS — K5904 Chronic idiopathic constipation: Secondary | ICD-10-CM | POA: Diagnosis not present

## 2021-02-28 DIAGNOSIS — R569 Unspecified convulsions: Secondary | ICD-10-CM | POA: Diagnosis not present

## 2021-03-24 ENCOUNTER — Ambulatory Visit: Payer: BC Managed Care – PPO | Admitting: Cardiology

## 2021-04-18 ENCOUNTER — Encounter: Payer: Self-pay | Admitting: Family Medicine

## 2021-04-18 DIAGNOSIS — R140 Abdominal distension (gaseous): Secondary | ICD-10-CM | POA: Diagnosis not present

## 2021-04-19 ENCOUNTER — Ambulatory Visit: Payer: BC Managed Care – PPO | Attending: Internal Medicine

## 2021-04-19 DIAGNOSIS — Z20822 Contact with and (suspected) exposure to covid-19: Secondary | ICD-10-CM

## 2021-04-20 LAB — NOVEL CORONAVIRUS, NAA: SARS-CoV-2, NAA: DETECTED — AB

## 2021-04-20 LAB — SARS-COV-2, NAA 2 DAY TAT

## 2021-05-17 DIAGNOSIS — K6389 Other specified diseases of intestine: Secondary | ICD-10-CM | POA: Diagnosis not present

## 2021-06-15 ENCOUNTER — Other Ambulatory Visit: Payer: Self-pay

## 2021-06-15 ENCOUNTER — Ambulatory Visit (INDEPENDENT_AMBULATORY_CARE_PROVIDER_SITE_OTHER): Payer: BC Managed Care – PPO | Admitting: Family Medicine

## 2021-06-15 VITALS — Ht 64.0 in | Wt 164.0 lb

## 2021-06-15 DIAGNOSIS — M1711 Unilateral primary osteoarthritis, right knee: Secondary | ICD-10-CM | POA: Diagnosis not present

## 2021-06-15 NOTE — Patient Instructions (Signed)
It was great to see you today, thank you for letting me participate in your care!  Today, we discussed your right knee arthritis. Continue to wear the thicker sleeve for the next 6 weeks. We are putting in a physical therapy order to help strengthen the muscles and ligaments around the knee. There is no need for an MRI today. Continue the voltaren gel. If you would like a thicker knee sleeve, a 78m thickness sleeve would work (just remember it can limit your knee flexion so I may not be best for biking).   You will follow-up in 4-6 weeks.  If you have any further questions, please give the clinic a call [phone]  Cheers,  Dr. KKerby Less& Dr. [facility]

## 2021-06-15 NOTE — Progress Notes (Signed)
PCP: Kim Nian, DO  Subjective:   HPI: Patient is a 65 y.o. female here for right knee OA. She was last seen in 2020. Since then she has been doing:  peloton, water aerobics, and working with a trainer 2x/wk. She wears a knee sleeve with most exercises and when she's walking. She is interested in optimizing her care and treatment plan to prevent progression and minimize functional pain. She wonders if she needs an MRI and if another round of physical therapy would help her optimize her OA and get her closer to 'back to normal'. She denies any numbness, tingling, fever, loss of bowel or bladder fuction, popping/clicking/or catching of the knee. She notices grinding of the outside of the knee.   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 60 tablet 2   desonide (DESOWEN) 0.05 % cream Apply 1 application topically as needed.     estradiol (ESTRACE) 0.1 MG/GM vaginal cream Place vaginally.     fluticasone (FLONASE) 50 MCG/ACT nasal spray Place into the nose daily as needed.     LAMICTAL 100 MG tablet Take 1 tablet (100 mg total) by mouth 2 (two) times daily. 180 tablet 4   levothyroxine (SYNTHROID) 50 MCG tablet Take 50 mcg by mouth daily before breakfast. 75 Mcg every Sunday     omeprazole (PRILOSEC) 40 MG capsule Take 1 capsule (40 mg total) by mouth in the morning and at bedtime. Dispense as brand name Prilosec per patient request.  60 capsule 3   rosuvastatin (CRESTOR) 10 MG tablet Take 10 mg by mouth at bedtime.     No current facility-administered medications on file prior to visit.    Past Surgical History:  Procedure Laterality Date   COLONOSCOPY  12/10/2015   [place] Dr Meredith Leeds   ESOPHAGOGASTRODUODENOSCOPY  06/03/2019   Dr Celso Sickle, [place]   WISDOM TOOTH EXTRACTION      No Known Allergies  Social History   Socioeconomic History   Marital status: Married    Spouse name: Kim Kim   Number of children: 3   Years of education: Not on file   Highest education level: Associate degree: occupational, Hotel manager, or vocational program  Occupational History    Comment: NA  Tobacco Use   Smoking status: Never   Smokeless tobacco: Never  Vaping Use   Vaping Use: Never used  Substance and Sexual Activity   Alcohol use: Yes    Comment: socially   Drug use: Never   Sexual activity: Yes    Birth control/protection: Post-menopausal  Other Topics Concern   Not on file  Social History Narrative   She recently moved from [place]. 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  Caffeine- coffee maybe 1 c, some tea   Social Determinants of Radio broadcast assistant Strain: Not on file  Food Insecurity: Not on file  Transportation Needs: Not on file  Physical Activity: Not on file  Stress: Not on file  Social Connections: Not on file  Intimate Partner Violence: Not on file    Family History  Problem Relation Age of Onset   Heart disease Mother    Diabetes Mother    Thyroid disease Mother        hypo   Heart disease Father    Hypertension Father    Esophageal cancer Neg Hx    Colon cancer Neg Hx    Colon polyps Neg Hx    Stomach cancer Neg Hx    Rectal cancer Neg Hx     Ht '5\' 4"'$  (1.626 m)   Wt 164 lb (74.4 kg)   BMI 28.15 kg/m   No flowsheet data found.  No flowsheet data found.  Review of Systems: See HPI above.     Objective:  Physical Exam:  Gen: NAD,  comfortable in exam room Knee, right: mild TTP noted at the lateral joint line. Inspection was negative for erythema, ecchymosis, and effusion. Obvious bony abnormalities and signs of osteophyte development. Palpation yielded no asymmetric warmth; No condyle tenderness; No patellar tenderness; +knee crepitus. Patellar and quadriceps tendons unremarkable, and no tenderness of the pes anserine bursa. No obvious Baker's cyst development. ROM normal in flexion (135 degrees) and extension (0 degrees). Strength 5/5 with knee flexion and extension. Neurovascularly intact bilaterally.   Provocative Testing:    - Patella:   - Patellar grind/compression: NEG   - Patellar glide: Appropriate medial/lateral glide without apprehension - Cruciate Ligaments:   - Anterior Drawer/Lachman test: NEG - Posterior Drawer: NEG  - Collateral Ligaments:   - Varus/Valgus (MCL/LCL) Stress test at 0, 15d: NEG   - Meniscus:   - Thessaly: NEG   - McMurray's: NEG    Assessment & Plan:   1. Osteoarthritis, right knee Moderately controlled with conservative therapy. No indication for MRI or CSI today. We discussed the long term goal of maintaining functionality via exercise, as this has the best evidence for long term outcomes. We can use CSI if needed down the road to avoid TKR, but symptoms are not severe enough yet to warrant this.  - in order to optimize her treatment we will place an order for PT at [facility] (ideally one day per week of aquatic therapy in addition to non-aquatic therapy) - continue knee sleeve for the next 6 weeks; can use 55m thick sleeve if current sleeve is not helping - voltaren gel 4x/d prn - continue exercising; ok to squat/lunge but limit ROM/intensity/volume to be mostly pain free - follow up in 4-6 weeks for re-evaluation  NWindy Fast MD, visiting resident  Patient seen and examined with resident.  Note edited above.  Agree with findings and plan.

## 2021-06-16 ENCOUNTER — Encounter: Payer: Self-pay | Admitting: Family Medicine

## 2021-06-27 ENCOUNTER — Ambulatory Visit (HOSPITAL_BASED_OUTPATIENT_CLINIC_OR_DEPARTMENT_OTHER): Payer: BC Managed Care – PPO | Attending: Family Medicine | Admitting: Physical Therapy

## 2021-06-27 ENCOUNTER — Other Ambulatory Visit: Payer: Self-pay

## 2021-06-27 ENCOUNTER — Encounter (HOSPITAL_BASED_OUTPATIENT_CLINIC_OR_DEPARTMENT_OTHER): Payer: Self-pay | Admitting: Physical Therapy

## 2021-06-27 DIAGNOSIS — G8929 Other chronic pain: Secondary | ICD-10-CM | POA: Insufficient documentation

## 2021-06-27 DIAGNOSIS — M1711 Unilateral primary osteoarthritis, right knee: Secondary | ICD-10-CM | POA: Insufficient documentation

## 2021-06-27 DIAGNOSIS — M6281 Muscle weakness (generalized): Secondary | ICD-10-CM | POA: Insufficient documentation

## 2021-06-27 DIAGNOSIS — M25661 Stiffness of right knee, not elsewhere classified: Secondary | ICD-10-CM | POA: Insufficient documentation

## 2021-06-27 DIAGNOSIS — M25561 Pain in right knee: Secondary | ICD-10-CM | POA: Insufficient documentation

## 2021-06-28 NOTE — Therapy (Signed)
[facility] [address] [place], [place], [address] Phone: [phone]   Fax:  [phone]  Physical Therapy Evaluation  Patient Details  Name: Kim Kim MRN: [id number] Date of Birth: January 01, 1956 Referring Provider (PT): Nyoka Cowden, MD   Encounter Date: 06/27/2021   PT End of Session - 06/27/21 2356     Visit Number 1    Number of Visits 12    Date for PT Re-Evaluation 08/08/21    Authorization Type BCBS    Progress Note Due on Visit --   07/25/21   PT Start Time 1355    PT Stop Time 1450    PT Time Calculation (min) 55 min    Activity Tolerance Patient tolerated treatment well    Behavior During Therapy [facility] for tasks assessed/performed             Past Medical History:  Diagnosis Date   Allergy    seasonal allergies   Anemia    hx of   Carotid atherosclerosis    Gastric ulcer    hx of   GERD (gastroesophageal reflux disease)    on meds   Growth hormone deficiency ([place])    Hiatal hernia    Hiatal hernia    Hypothyroidism    hx of Hashimoto thyroiditis   Pituitary adenoma ([place])    hx of   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    Subjective Assessment - 06/27/21 1404     Subjective Pt reports she was active and walking 3 days per week.  One day in 2019 after walking, she had significant pain and swelling.  Pt moved to [place] from [place] and has received Rx for R knee since.  Pt then had increased pain using her peloton.  Pt states her activity level has decreased since covid.  Pt has received PT twice before and reports some improvement.  Pt is a member here at [employer] and is active with water aerobics  3-4x/wk and working with a trainer 2x/wk.  Pt also uses her peloton.  MD provided pt with a knee sleeve which she wears with most exercises and when she's walking.  Pt feels better while wearing knee sleeve.  MD ordered PT and script indicated to please include aquatic rehab in pt's treatment plan.  Pt states her worst pain is usually after her workouts especially after water aerobics class.  Pt has increased pain with activity.  She has pain with performing stairs and ambulating on inclines/declines.  "When I don't do anything, me knee is fine".   Pt reports she is feeling better and her knee is doing better overall.    Pertinent History Pt reports low end osteopenia, and denies osteoporosis.    How long can you sit comfortably? not limited    How long can you stand comfortably? not limited    How long can you walk comfortably? not limited and uses a knee sleeve    Diagnostic tests [place] in 2020:  Impression: Patellofemoral OA more lateral in trochlear groove and mild OS of medial compartment    Patient Stated Goals understand  what she can and can't do especially in the water.  pt wants to make sure that she is not doing anything that increases her pain or sx's.    Currently in Pain? No/denies    Pain Score --   worst pain:  5/10   Pain Location Knee   medial, anterior, and lateral   Pain Orientation Right    Aggravating Factors  activity, stairs    Pain Relieving Factors rest, sitting                [facility] PT Assessment - 06/28/21 0001       Assessment   Medical Diagnosis Primary OA of R knee    Referring Provider (PT) Nyoka Cowden, MD    Onset Date/Surgical Date --   Pain began in 2019; this MD order for PT on 06/15/21   Hand Dominance Right    Next MD Visit 07/28/2021    Prior Therapy Pt has received PT for knee twice in the past      Precautions   Precautions --   none except dx     Restrictions   Weight Bearing Restrictions No      Balance Screen   Has the patient fallen in  the past 6 months No      Prior Function   Level of Independence Independent   Pt has had pain since 2019 which has affected her activity.  Pt was able to ambulate increased distance and perform more activity prior to onset.  She does state she has been feeling better overall now.     Cognition   Overall Cognitive Status Within Functional Limits for tasks assessed      Observation/Other Assessments   Other Surveys  Lower Extremity Functional Scale    Lower Extremity Functional Scale  56/80      ROM / Strength   AROM / PROM / Strength AROM;PROM;Strength      AROM   AROM Assessment Site Knee    Right Knee Extension 2    Right Knee Flexion 127    Left Knee Extension 0    Left Knee Flexion 133      PROM   PROM Assessment Site Knee    Right/Left Knee Right    Right Knee Extension 0      Strength   Right Hip Flexion 5/5    Right Hip Extension --   <3/5   Right Hip ABduction 4/5    Right Hip ADduction 5/5    Left Hip Flexion 4+/5    Left Hip Extension --   <3/5   Left Hip External Rotation 5/5    Left Hip ABduction 5/5    Left Hip ADduction 5/5    Right Knee Flexion 5/5    Right Knee Extension 5/5    Left Knee Flexion 5/5    Left Knee Extension 5/5      Ambulation/Gait   Gait Comments Pt ambulates with a heel to toe gait without limping.                        Objective measurements completed on examination: See above findings.       [facility] Adult PT Treatment/Exercise - 06/28/21 0001       Exercises   Exercises Knee/Hip      Knee/Hip Exercises: Supine   Other Supine Knee/Hip Exercises supine heel slides x10 though pt had pain. Did not include with HEP    Other Supine  Knee/Hip Exercises supine SLR x10 reps, supine bridge x 12 reps      Knee/Hip Exercises: Sidelying   Hip ABduction Limitations x10 reps                    PT Education - 06/27/21 1453     Education Details Educated pt concerning POC and dx.  Answered Pt's questions.  Pt  received a HEP handout and was educated in correct form and appropriate frequency.  Educated pt she should not have increased pain with exercises.  Access Code: [email or web address]  URL: [email or web address]  Date: 06/27/2021  Prepared by: Ronny Flurry    Exercises  Supine Bridge - 1 x daily - 6 x weekly - 2 sets - 10 reps  Supine Active Straight Leg Raise - 1 x daily - 5-6 x weekly - 2 sets - 10 reps  Sidelying Hip Abduction - 1 x daily - 6 x weekly - 2 sets - 10 reps    Person(s) Educated Patient    Methods Explanation;Demonstration;Handout    Comprehension Returned demonstration;Verbalized understanding              PT Short Term Goals - 06/27/21 2357       PT SHORT TERM GOAL #1   Title pt will be independent with land based HEP for improved ROM, pain, strength, and function.    Time 3    Period Weeks    Status New    Target Date 07/18/21      PT SHORT TERM GOAL #2   Title Pt will demo improved R knee extension AROM to 0 deg for improved stiffness and mobility.    Time 3    Period Weeks    Status New    Target Date 07/18/21      PT SHORT TERM GOAL #3   Title Pt will demo improved hip extension strength to 4/5 MMT bilat for improved mobility and tolerance with activities.    Time 4    Period Weeks    Status New    Target Date 07/25/21      PT SHORT TERM GOAL #4   Title Pt will tolerate aquatic exercises well without adverse effects for improved tolerance to activity.    Time 3    Period Weeks    Status New    Target Date 07/18/21               PT Long Term Goals - 06/27/21 2357       PT LONG TERM GOAL #1   Title Pt will perform a reciprocal gait on stairs without significant R knee pain.    Time 6    Period Weeks    Target Date 08/08/21      PT LONG TERM GOAL #2   Title Pt will report at least a 50% reduction in pain with activity.    Time 6    Period Weeks    Status New    Target Date 08/08/21      PT LONG TERM GOAL #3   Title Pt will be  independent with aquatic program and be able to perform aquatic exercises without adverse effects to establish an aquatic program for improved strength, pain, and tolerance to activity.    Time 6    Period Weeks    Status New    Target Date 08/08/21      PT LONG TERM GOAL #4   Title Pt will be able to perform walking  program without adverse effects.    Time 6    Period Weeks    Status New    Target Date 08/08/21      PT LONG TERM GOAL #5   Title Pt will demo good understanding of appropriate workout activities and be able to perform workout activities without significant pain.    Time 6    Period Weeks    Status New    Target Date 08/08/21      Additional Long Term Goals   Additional Long Term Goals Yes      PT LONG TERM GOAL #6   Title Pt will demo 5/5 R hip flex and abd strength and at least 4+/5 hip extension strength for improved tolerance with and performance of functional mobility and workout activities.    Time 6    Period Weeks    Status New    Target Date 08/08/21                    Plan - 06/27/21 2356     Clinical Impression Statement Pt is a 65 y/o female with a dx of primary OA of R knee presenting to the clinic with R knee pain, muscle weakness in bilat hips, and limited extension ROM.  Pt states her activity level has decreased since covid though she has been active with a trainer, water aerobics class, and her peloton.  Pt has received PT twice before and reports some improvement.  Pt reports improved sx's with her knee sleeve provided by MD.  MD ordered PT and script indicated to include aquatic rehab.  Pt feels better with rest and has increased pain with activity.  She has increased pain after her workouts especially after water aerobics class.  She has pain with performing stairs and ambulating on inclines/declines.  Pt lacks 2 deg of R knee extension AROM and had full extension on L knee.  Pt should benefit from skilled PT services.    Personal  Factors and Comorbidities Comorbidity 1    Comorbidities Pt reports having low end osteopenia    Examination-Activity Limitations Squat;Stairs   workout activities   Stability/Clinical Decision Making Stable/Uncomplicated    Clinical Decision Making Low    Rehab Potential Good    PT Frequency 2x / week    PT Duration 6 weeks    PT Treatment/Interventions Aquatic Therapy;ADLs/Self Care Home Management;Cryotherapy;Electrical Stimulation;DME Instruction;Ultrasound;Moist Heat;Gait training;Stair training;Functional mobility training;Therapeutic activities;Therapeutic exercise;Balance training;Neuromuscular re-education;Manual techniques;Patient/family education;Passive range of motion;Dry needling;Taping    PT Next Visit Plan Aquatic therapy next visit.  Review HEP and progress HEP as pt tolerates when on land.    PT Home Exercise Plan Access Code: [email or web address] URL: [email or web address] Date: 06/27/2021 Prepared by: Ronny Flurry Exercises Supine Bridge - 1 x daily - 6 x weekly - 2 sets - 10 reps Supine Active Straight Leg Raise - 1 x daily - 5-6 x weekly - 2 sets - 10 reps Sidelying Hip Abduction - 1 x daily - 6 x weekly - 2 sets - 10 reps    Consulted and Agree with Plan of Care Patient             Patient will benefit from skilled therapeutic intervention in order to improve the following deficits and impairments:  Decreased activity tolerance, Decreased range of motion, Decreased strength, Hypomobility, Decreased mobility, Pain  Visit Diagnosis: Chronic pain of right knee  Muscle weakness (generalized)  Stiffness of right knee, not elsewhere classified  Problem List Patient Active Problem List   Diagnosis Date Noted   Thoracic back pain 05/19/2020   Prediabetes 01/22/2020   Weight gain 01/22/2020   Osteopenia 01/22/2020   Osteoarthritis 07/17/2019   Family history of heart disease 12/11/2018   Atypical chest pain 12/11/2018   B12 deficiency 10/16/2017    Elevated BP without diagnosis of hypertension 10/01/2017   Osteoporosis 10/01/2017   Uterine fibroid 07/31/2017   History of cleft palate 11/22/2016   Velopharyngeal insufficiency (VPI), 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   Normocytic anemia 07/31/2016   Screen for colon cancer 07/31/2016   Iron deficiency 04/27/2016   Hypothyroidism 12/17/2012   Insulin resistance 12/17/2012   [place] 10/15/2012   Hashimoto's thyroiditis 07/24/2012   Hyperlipidemia 07/24/2012   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 [place], [place], [address] Phone: [phone]   Fax:  [phone]  Name: Ixchel Mcguyer MRN: [id number] Date of Birth: 1956/07/01

## 2021-06-29 ENCOUNTER — Other Ambulatory Visit: Payer: Self-pay

## 2021-06-29 ENCOUNTER — Encounter (HOSPITAL_BASED_OUTPATIENT_CLINIC_OR_DEPARTMENT_OTHER): Payer: Self-pay | Admitting: Physical Therapy

## 2021-06-29 ENCOUNTER — Ambulatory Visit (HOSPITAL_BASED_OUTPATIENT_CLINIC_OR_DEPARTMENT_OTHER): Payer: BC Managed Care – PPO | Admitting: Physical Therapy

## 2021-06-29 DIAGNOSIS — M6281 Muscle weakness (generalized): Secondary | ICD-10-CM | POA: Diagnosis not present

## 2021-06-29 DIAGNOSIS — G8929 Other chronic pain: Secondary | ICD-10-CM | POA: Diagnosis not present

## 2021-06-29 DIAGNOSIS — M1711 Unilateral primary osteoarthritis, right knee: Secondary | ICD-10-CM | POA: Diagnosis not present

## 2021-06-29 DIAGNOSIS — M25661 Stiffness of right knee, not elsewhere classified: Secondary | ICD-10-CM

## 2021-06-29 DIAGNOSIS — M25561 Pain in right knee: Secondary | ICD-10-CM | POA: Diagnosis not present

## 2021-06-29 NOTE — Therapy (Signed)
[facility] [address] [place], [place], [address] Phone: [phone]   Fax:  [phone]  Physical Therapy Treatment  Patient Details  Name: Kim Kim MRN: [id number] Date of Birth: 09-30-1956 Referring Provider (PT): Nyoka Cowden, MD   Encounter Date: 06/29/2021   PT End of Session - 06/29/21 1122     Visit Number 2    Number of Visits 12    Date for PT Re-Evaluation 08/08/21    Authorization Type BCBS    PT Start Time 1117    PT Stop Time 1205    PT Time Calculation (min) 48 min    Equipment Utilized During Treatment Other (comment)   Ankle cuff and waist buoys, kick board, noodle/sqoodle, nekdoodle, weights and barbells, water walker   Activity Tolerance Patient tolerated treatment well    Behavior During Therapy WFL for tasks assessed/performed             Past Medical History:  Diagnosis Date   Allergy    seasonal allergies   Anemia    hx of   Carotid atherosclerosis    Gastric ulcer    hx of   GERD (gastroesophageal reflux disease)    on meds   Growth hormone deficiency ([place])    Hiatal hernia    Hiatal hernia    Hypothyroidism    hx of Hashimoto thyroiditis   Pituitary adenoma ([place])    hx of   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   Subjective Assessment - 06/29/21 1230     Subjective Pt reports Pressure along right knee joint line and patellar tendon inserton.  Has been working 2 x week with personal traininer and has been in aquatic aerobic classes.                                         PT Short Term Goals - 06/27/21 2357       PT SHORT TERM GOAL #1    Title pt will be independent with land based HEP for improved ROM, pain, strength, and function.    Time 3    Period Weeks    Status New    Target Date 07/18/21      PT SHORT TERM GOAL #2   Title Pt will demo improved R knee extension AROM to 0 deg for improved stiffness and mobility.    Time 3    Period Weeks    Status New    Target Date 07/18/21      PT SHORT TERM GOAL #3   Title Pt will demo improved hip extension strength to 4/5 MMT bilat for improved mobility and tolerance with activities.    Time 4    Period Weeks    Status New    Target Date 07/25/21      PT SHORT TERM GOAL #4   Title Pt will tolerate aquatic exercises well without adverse effects for improved tolerance to activity.    Time 3    Period Weeks  Status New    Target Date 07/18/21               PT Long Term Goals - 06/27/21 2357       PT LONG TERM GOAL #1   Title Pt will perform a reciprocal gait on stairs without significant R knee pain.    Time 6    Period Weeks    Target Date 08/08/21      PT LONG TERM GOAL #2   Title Pt will report at least a 50% reduction in pain with activity.    Time 6    Period Weeks    Status New    Target Date 08/08/21      PT LONG TERM GOAL #3   Title Pt will be independent with aquatic program and be able to perform aquatic exercises without adverse effects to establish an aquatic program for improved strength, pain, and tolerance to activity.    Time 6    Period Weeks    Status New    Target Date 08/08/21      PT LONG TERM GOAL #4   Title Pt will be able to perform walking program without adverse effects.    Time 6    Period Weeks    Status New    Target Date 08/08/21      PT LONG TERM GOAL #5   Title Pt will demo good understanding of appropriate workout activities and be able to perform workout activities without significant pain.    Time 6    Period Weeks    Status New    Target Date 08/08/21      Additional Long Term Goals   Additional Long  Term Goals Yes      PT LONG TERM GOAL #6   Title Pt will demo 5/5 R hip flex and abd strength and at least 4+/5 hip extension strength for improved tolerance with and performance of functional mobility and workout activities.    Time 6    Period Weeks    Status New    Target Date 08/08/21            Pt seen for aquatic therapy today.  Treatment took place in water 3.25-4.8 ft in depth at the [facility] pool. Temp of water was 91.  Pt entered/exited the pool via stairs step to pattern independently with bilat rail.  Introduction to aquatic setting  Warm up: forward, backward and side stepping/walking cues for increased step length, increased speed, hand placement to increase resistance.  Seated Stretching: gastroc, hamstrings, 5 x 20 sec   Standing Stretching;Hip flex and quad using noodle facing pool wall 3 x 30 sec hold -Gastroc on bottom step 3 x 30 sec hold -LB superman position 3 x 30 secs Strengthening; Hip flex x 10 reps pulling noodle toward wall -Noodle (yellow) kick down 2 x 10 reps supported by wall then 2 x 10 supported by 1 foam buoys R/L. VC, demo and TC for core tightening, improved alignment and posture with activity.  Vertical suspension Straddling noodle Quad strengthening, balance/core challenges as well as aerobic capacity training; bicyclicing/scissoring/add/abd 20 fast revolutions to 20 slow x 10 mins.   Pt requires buoyancy for support and to offload joints with strengthening exercises. Viscosity of the water is needed for resistance of strengthening; water current perturbations provides challenge to standing balance unsupported, requiring increased core activation.        Plan - 06/29/21 1222     Clinical  Impression Statement Pt active 65 yo. Actively pursuing techniques to improve and maintain healthy joints/knee. Involved with water aerobics and personal training.  Pt edu and instruction today on exercises to isolate le muscles and  encourage proper patella tracking.  Completed ex in open and closed chain using ankle buoys. Some discomfort/pressure about joint line left knee.  Pt encouraged to work in low discomfomrt/no discomfort range.             Patient will benefit from skilled therapeutic intervention in order to improve the following deficits and impairments:     Visit Diagnosis: Chronic pain of right knee  Acute pain of right knee  Muscle weakness (generalized)  Stiffness of right knee, not elsewhere classified     Problem List Patient Active Problem List   Diagnosis Date Noted   Thoracic back pain 05/19/2020   Prediabetes 01/22/2020   Weight gain 01/22/2020   Osteopenia 01/22/2020   Osteoarthritis 07/17/2019   Family history of heart disease 12/11/2018   Atypical chest pain 12/11/2018   B12 deficiency 10/16/2017   Elevated BP without diagnosis of hypertension 10/01/2017   Osteoporosis 10/01/2017   Uterine fibroid 07/31/2017   History of cleft palate 11/22/2016   Velopharyngeal insufficiency (VPI), 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   Normocytic anemia 07/31/2016   Screen for colon cancer 07/31/2016   Iron deficiency 04/27/2016   Hypothyroidism 12/17/2012   Insulin resistance 12/17/2012   [place] 10/15/2012   Hashimoto's thyroiditis 07/24/2012   Hyperlipidemia 07/24/2012   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 [place], [place], [address] Phone: [phone]   Fax:   [phone]  Name: Kim Kim MRN: [id number] Date of Birth: 29-Dec-1955

## 2021-07-06 ENCOUNTER — Ambulatory Visit (HOSPITAL_BASED_OUTPATIENT_CLINIC_OR_DEPARTMENT_OTHER): Payer: BC Managed Care – PPO | Admitting: Physical Therapy

## 2021-07-06 DIAGNOSIS — Z1389 Encounter for screening for other disorder: Secondary | ICD-10-CM | POA: Diagnosis not present

## 2021-07-06 DIAGNOSIS — Z01419 Encounter for gynecological examination (general) (routine) without abnormal findings: Secondary | ICD-10-CM | POA: Diagnosis not present

## 2021-07-06 DIAGNOSIS — Z124 Encounter for screening for malignant neoplasm of cervix: Secondary | ICD-10-CM | POA: Diagnosis not present

## 2021-07-06 DIAGNOSIS — Z1151 Encounter for screening for human papillomavirus (HPV): Secondary | ICD-10-CM | POA: Diagnosis not present

## 2021-07-07 IMAGING — DX DG WRIST COMPLETE 3+V*R*
5 series · 5 of 5 positions shown · non-contrast
Comparison: None.

CLINICAL DATA: Right wrist pain in the region of the pisiform

EXAM:
RIGHT WRIST - COMPLETE 3+ VIEW

[dg wrist complete right (1 of 4)]
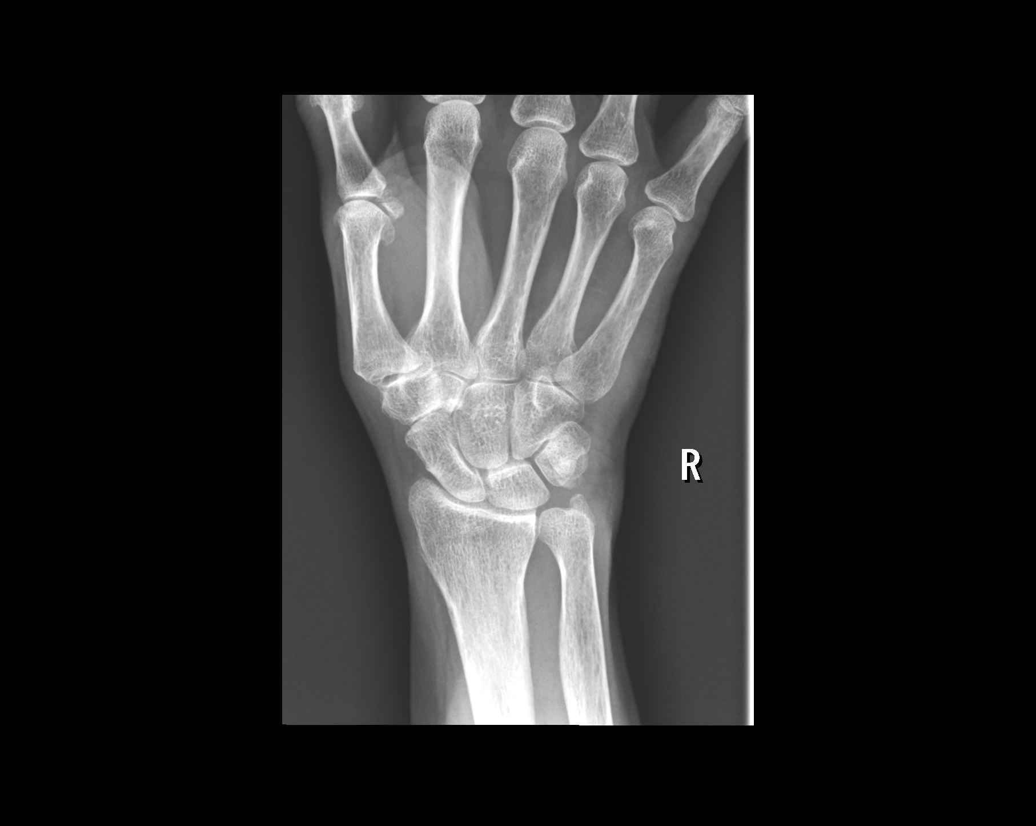

[dg wrist complete right (2 of 4)]
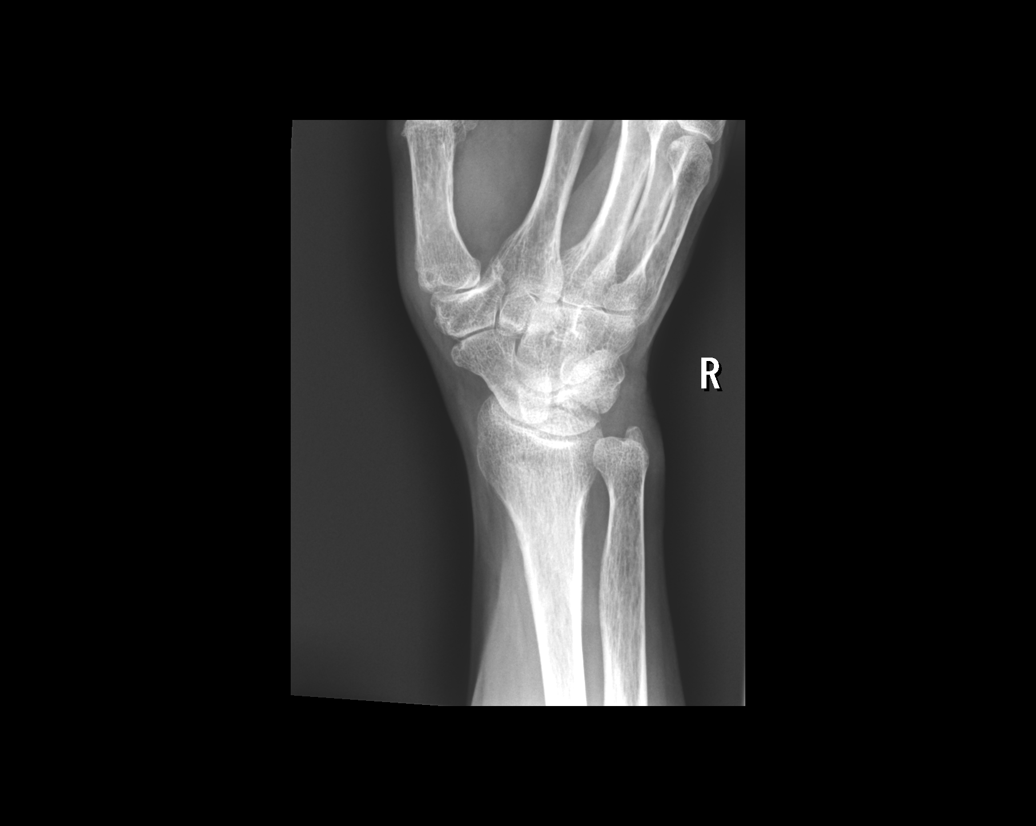

[dg wrist complete right (3 of 4)]
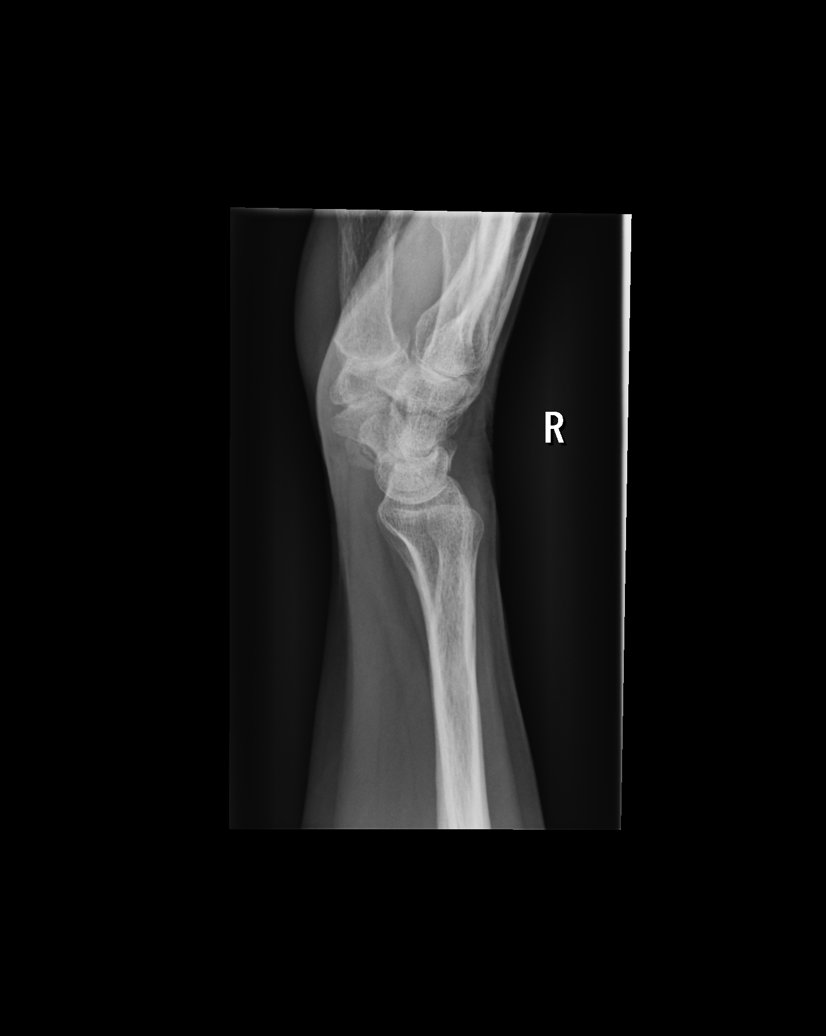

[dg wrist complete right (4 of 4)]
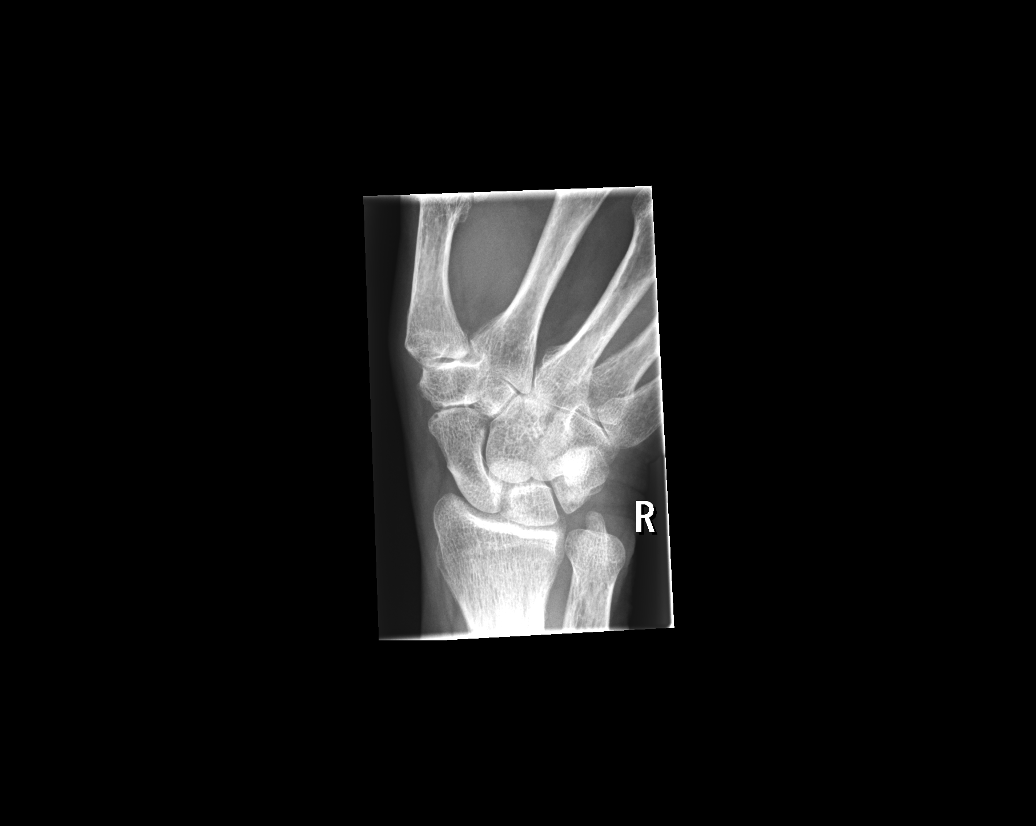

[view not recorded]
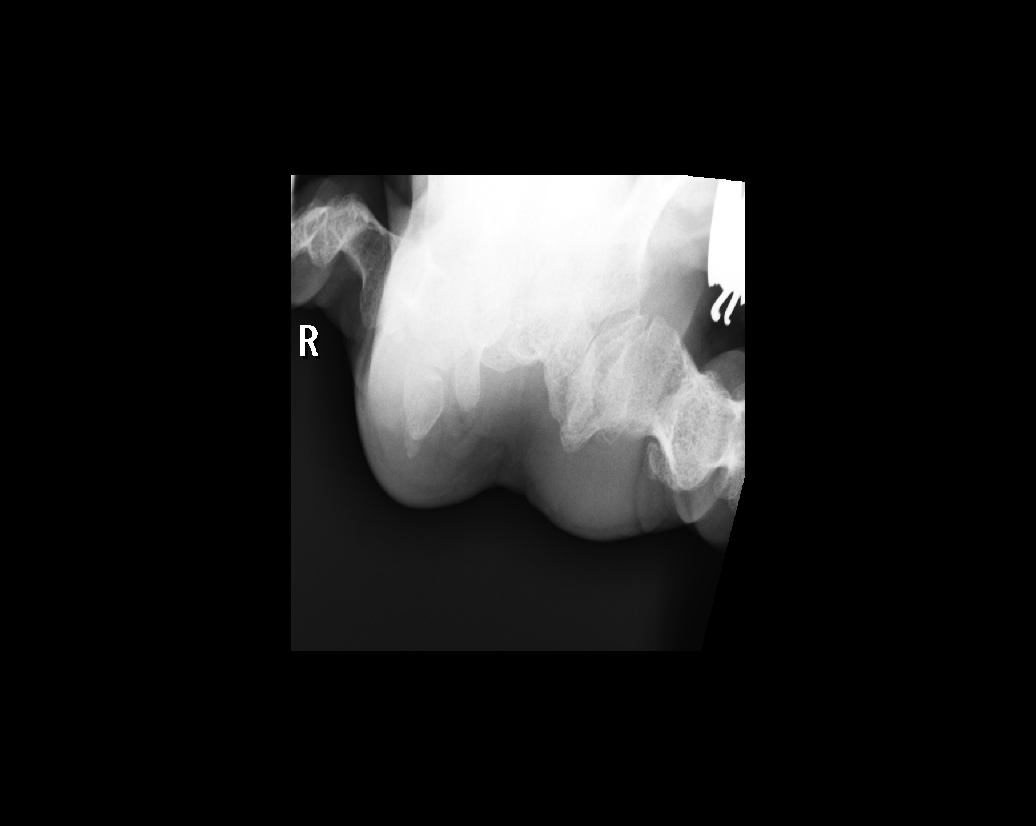

[5 of 5 positions shown; findings below may reference images not displayed]

FINDINGS: There is marked sclerosis about the articulation of the pisiform
with triquetrum best seen on the lateral and carpal tunnel views.
There is a questionable lucency through the anterior aspect of the
pisiform bone on lateral with a possible fragment visible on the
frontal projection. Could reflect sequela prior injury. No other
acute fracture or traumatic malalignment is seen. Additional
arthrosis is seen at the radiocarpal articulation, first
carpometacarpal and triscaphe joints as well as at the first
metacarpophalangeal and interphalangeal joint.
IMPRESSION: 1. Possible nondisplaced fracture line seen through the anterior
aspect of the pisiform bone on lateral projection and lateral
pisiform on the frontal projection. Correlate for point tenderness.
2. Additional degenerative changes at the pisiform articulation with
the triquetrum.
3. Additional arthrosis at the triscaphe joint and first ray as
above.

## 2021-07-11 ENCOUNTER — Ambulatory Visit (HOSPITAL_BASED_OUTPATIENT_CLINIC_OR_DEPARTMENT_OTHER): Payer: BC Managed Care – PPO | Admitting: Physical Therapy

## 2021-07-11 DIAGNOSIS — K5900 Constipation, unspecified: Secondary | ICD-10-CM | POA: Diagnosis not present

## 2021-07-11 DIAGNOSIS — R4189 Other symptoms and signs involving cognitive functions and awareness: Secondary | ICD-10-CM | POA: Diagnosis not present

## 2021-07-11 DIAGNOSIS — K6389 Other specified diseases of intestine: Secondary | ICD-10-CM | POA: Diagnosis not present

## 2021-07-11 DIAGNOSIS — Z0289 Encounter for other administrative examinations: Secondary | ICD-10-CM

## 2021-07-11 DIAGNOSIS — R198 Other specified symptoms and signs involving the digestive system and abdomen: Secondary | ICD-10-CM | POA: Diagnosis not present

## 2021-07-12 ENCOUNTER — Ambulatory Visit (INDEPENDENT_AMBULATORY_CARE_PROVIDER_SITE_OTHER): Payer: BC Managed Care – PPO | Admitting: Family Medicine

## 2021-07-12 ENCOUNTER — Encounter (INDEPENDENT_AMBULATORY_CARE_PROVIDER_SITE_OTHER): Payer: Self-pay | Admitting: Family Medicine

## 2021-07-12 ENCOUNTER — Other Ambulatory Visit: Payer: Self-pay

## 2021-07-12 DIAGNOSIS — Z9189 Other specified personal risk factors, not elsewhere classified: Secondary | ICD-10-CM

## 2021-07-12 DIAGNOSIS — Z1331 Encounter for screening for depression: Secondary | ICD-10-CM | POA: Diagnosis not present

## 2021-07-12 DIAGNOSIS — E038 Other specified hypothyroidism: Secondary | ICD-10-CM

## 2021-07-12 DIAGNOSIS — E663 Overweight: Secondary | ICD-10-CM

## 2021-07-12 DIAGNOSIS — R632 Polyphagia: Secondary | ICD-10-CM

## 2021-07-12 DIAGNOSIS — E785 Hyperlipidemia, unspecified: Secondary | ICD-10-CM | POA: Diagnosis not present

## 2021-07-13 LAB — FOLATE: Folate: 11.4 ng/mL (ref >3.0)

## 2021-07-13 LAB — COMPREHENSIVE METABOLIC PANEL
ALT: 16 IU/L (ref 0–32)
AST: 26 IU/L (ref 0–40)
Albumin/Globulin Ratio: 1.8 (ref 1.2–2.2)
Albumin: 5.1 g/dL — ABNORMAL HIGH (ref 3.8–4.8)
Alkaline Phosphatase: 119 IU/L (ref 44–121)
BUN/Creatinine Ratio: 16 (ref 12–28)
BUN: 16 mg/dL (ref 8–27)
Bilirubin Total: 0.4 mg/dL (ref 0.0–1.2)
CO2: 26 mmol/L (ref 20–29)
Calcium: 9.8 mg/dL (ref 8.7–10.3)
Chloride: 98 mmol/L (ref 96–106)
Creatinine, Ser: 0.99 mg/dL (ref 0.57–1.00)
Globulin, Total: 2.8 g/dL (ref 1.5–4.5)
Glucose: 90 mg/dL (ref 65–99)
Potassium: 4.6 mmol/L (ref 3.5–5.2)
Sodium: 140 mmol/L (ref 134–144)
Total Protein: 7.9 g/dL (ref 6.0–8.5)
eGFR: 64 mL/min/{1.73_m2} (ref >59)

## 2021-07-13 LAB — VITAMIN B12: Vitamin B-12: 535 pg/mL (ref 232–1245)

## 2021-07-13 LAB — INSULIN, RANDOM: INSULIN: 9.5 u[IU]/mL (ref 2.6–24.9)

## 2021-07-13 NOTE — Progress Notes (Signed)
Chief Complaint:   OBESITY Kim Kim (MR# [id number]) is a 65 y.o. female who presents for evaluation and treatment of obesity and related comorbidities. Current BMI is Body mass index is 29.41 kg/m. Kim Kim has been struggling with Kim Kim weight for many years and has been unsuccessful in either losing weight, maintaining weight loss, or reaching Kim Kim healthy weight goal.  Kim Kim is currently in the action stage of change and ready to dedicate time achieving and maintaining a healthier weight. Kim Kim is interested in becoming our patient and working on intensive lifestyle modifications including (but not limited to) diet and exercise for weight loss.  [name]'s habits were reviewed today and are as follows: Kim Kim family eats meals together, Kim Kim desired weight loss is 11 lbs, she started gaining weight after children, Kim Kim heaviest weight ever was 178 pounds, she has significant food cravings issues, she is frequently drinking liquids with calories, she frequently makes poor food choices, and she frequently eats larger portions than normal.  Depression Screen [name]'s Food and Mood (modified PHQ-9) score was 2.  Depression screen [facility] 2/9 07/12/2021  Decreased Interest 0  Down, Depressed, Hopeless 1  PHQ - 2 Score 1  Altered sleeping 0  Tired, decreased energy 0  Change in appetite 0  Feeling bad or failure about yourself  0  Trouble concentrating 1  Moving slowly or fidgety/restless 0  Suicidal thoughts 0  PHQ-9 Score 2  Difficult doing work/chores Not difficult at all   Subjective:   1. Hyperlipidemia, unspecified hyperlipidemia type Kim Kim is on Crestor and she is working on diet. She will be getting labs sone soon.  2. Other specified hypothyroidism Kim Kim is on Synthroid, and she is followed by Kim Kim naturopathic doctor. She is due for labs soon  3. Polyphagia Kim Kim notes increased hunger at times. She may have some elevated insulin levels.  4. At risk for impaired metabolic  function Kim Kim is at increased risk for impaired metabolic function due to a history of low protein intake.  Assessment/Plan:   1. Hyperlipidemia, unspecified hyperlipidemia type Cardiovascular risk and specific lipid/LDL goals reviewed. We discussed several lifestyle modifications today. Jaicey will continue Crestor as is. She is to bring in Kim Kim lab results to Kim Kim next visit. She will continue to work on diet, exercise and weight loss efforts. Orders and follow up as documented in patient record.   - EKG 12-Lead  2. Other specified hypothyroidism Kim Kim will bring Kim Kim labs to Kim Kim next visit. Orders and follow up as documented in patient record.  - Comprehensive metabolic panel - Folate - Vitamin B12  3. Polyphagia Intensive lifestyle modifications are the first line treatment for this issue. We discussed several lifestyle modifications today. We will check labs today. Kim Kim will continue to work on diet, exercise and weight loss efforts. Orders and follow up as documented in patient record.  - Comprehensive metabolic panel - Insulin, random  4. Depression screening Kim Kim had a negative depression screening. Depression is commonly associated with obesity and often results in emotional eating behaviors. We will monitor this closely and work on CBT to help improve the non-hunger eating patterns. Referral to Psychology may be required if no improvement is seen as she continues in our clinic.  5. At risk for impaired metabolic function Kim Kim was given approximately 15 minutes of impaired  metabolic function prevention counseling today. We discussed intensive lifestyle modifications today with an emphasis on specific nutrition and exercise instructions and strategies.   Repetitive spaced learning was  employed today to elicit superior memory formation and behavioral change.  6. Overweight (BMI 25.0-29.9) Kim Kim is currently in the action stage of change and Kim Kim goal is to continue with weight  loss efforts. I recommend Kim Kim begin the structured treatment plan as follows:  She has agreed to the Category 1 Plan.  Exercise goals: No exercise has been prescribed for now, while we concentrate on nutritional changes.  Behavioral modification strategies: no skipping meals.  She was informed of the importance of frequent follow-up visits to maximize Kim Kim success with intensive lifestyle modifications for Kim Kim multiple health conditions. She was informed we would discuss Kim Kim lab results at Kim Kim next visit unless there is a critical issue that needs to be addressed sooner. Kim Kim agreed to keep Kim Kim next visit at the agreed upon time to discuss these results.  Objective:   Blood pressure 118/79, pulse 77, temperature 97.9 F (36.6 C), height '5\' 3"'$  (1.6 m), weight 166 lb (75.3 kg), SpO2 97 %. Body mass index is 29.41 kg/m.  EKG: Normal sinus rhythm, rate 78 BPM.  Indirect Calorimeter completed today shows a VO2 of 197 and a REE of 1354.  Kim Kim calculated basal metabolic rate is [id number] thus Kim Kim basal metabolic rate is worse than expected.  General: Cooperative, alert, well developed, in no acute distress. HEENT: Conjunctivae and lids unremarkable. Cardiovascular: Regular rhythm.  Lungs: Normal work of breathing. Neurologic: No focal deficits.   Lab Results  Component Value Date   CREATININE 0.99 07/12/2021   BUN 16 07/12/2021   NA 140 07/12/2021   K 4.6 07/12/2021   CL 98 07/12/2021   CO2 26 07/12/2021   Lab Results  Component Value Date   ALT 16 07/12/2021   AST 26 07/12/2021   ALKPHOS 119 07/12/2021   BILITOT 0.4 07/12/2021   Lab Results  Component Value Date   HGBA1C 6.1 09/06/2020   HGBA1C 5.8 01/07/2020   Lab Results  Component Value Date   INSULIN WILL FOLLOW 07/12/2021   Lab Results  Component Value Date   TSH 2.27 09/06/2020   No results found for: CHOL, HDL, LDLCALC, LDLDIRECT, TRIG, CHOLHDL Lab Results  Component Value Date   WBC 7.1 09/29/2020   HGB 12.0  09/29/2020   HCT 36.1 09/29/2020   MCV 80.8 09/29/2020   PLT 242.0 09/29/2020   Lab Results  Component Value Date   IRON 44 (L) 09/29/2020   TIBC 360 09/29/2020   FERRITIN 22.7 09/06/2020   Attestation Statements:   Reviewed by clinician on day of visit: allergies, medications, problem list, medical history, surgical history, family history, social history, and previous encounter notes.   I, Trixie Dredge, am acting as transcriptionist for Dennard Nip, MD.  I have reviewed the above documentation for accuracy and completeness, and I agree with the above. - Dennard Nip, MD

## 2021-07-14 DIAGNOSIS — Z1231 Encounter for screening mammogram for malignant neoplasm of breast: Secondary | ICD-10-CM | POA: Diagnosis not present

## 2021-07-14 DIAGNOSIS — E8881 Metabolic syndrome: Secondary | ICD-10-CM | POA: Diagnosis not present

## 2021-07-14 DIAGNOSIS — E559 Vitamin D deficiency, unspecified: Secondary | ICD-10-CM | POA: Diagnosis not present

## 2021-07-14 DIAGNOSIS — E063 Autoimmune thyroiditis: Secondary | ICD-10-CM | POA: Diagnosis not present

## 2021-07-14 DIAGNOSIS — E782 Mixed hyperlipidemia: Secondary | ICD-10-CM | POA: Diagnosis not present

## 2021-07-14 DIAGNOSIS — E038 Other specified hypothyroidism: Secondary | ICD-10-CM | POA: Diagnosis not present

## 2021-07-14 LAB — BASIC METABOLIC PANEL
BUN: 17 (ref 4–21)
CO2: 26 — AB (ref 13–22)
Chloride: 104 (ref 99–108)
Creatinine: 0.9 (ref 0.5–1.1)
Glucose: 87
Potassium: 4.2 (ref 3.4–5.3)
Sodium: 139 (ref 137–147)

## 2021-07-14 LAB — COMPREHENSIVE METABOLIC PANEL: GFR calc non Af Amer: 73

## 2021-07-14 LAB — HEMOGLOBIN A1C: Hemoglobin A1C: 5.7

## 2021-07-14 LAB — HEPATIC FUNCTION PANEL
ALT: 17 (ref 7–35)
AST: 25 (ref 13–35)
Alkaline Phosphatase: 112 (ref 25–125)

## 2021-07-18 DIAGNOSIS — G4452 New daily persistent headache (NDPH): Secondary | ICD-10-CM | POA: Diagnosis not present

## 2021-07-18 DIAGNOSIS — Z8719 Personal history of other diseases of the digestive system: Secondary | ICD-10-CM | POA: Diagnosis not present

## 2021-07-18 DIAGNOSIS — J3089 Other allergic rhinitis: Secondary | ICD-10-CM | POA: Diagnosis not present

## 2021-07-18 DIAGNOSIS — R0789 Other chest pain: Secondary | ICD-10-CM | POA: Diagnosis not present

## 2021-07-19 DIAGNOSIS — K6389 Other specified diseases of intestine: Secondary | ICD-10-CM | POA: Insufficient documentation

## 2021-07-21 ENCOUNTER — Ambulatory Visit (HOSPITAL_BASED_OUTPATIENT_CLINIC_OR_DEPARTMENT_OTHER): Payer: BC Managed Care – PPO | Attending: Family Medicine | Admitting: Physical Therapy

## 2021-07-21 ENCOUNTER — Other Ambulatory Visit: Payer: Self-pay

## 2021-07-21 ENCOUNTER — Encounter (HOSPITAL_BASED_OUTPATIENT_CLINIC_OR_DEPARTMENT_OTHER): Payer: Self-pay | Admitting: Physical Therapy

## 2021-07-21 DIAGNOSIS — M25561 Pain in right knee: Secondary | ICD-10-CM | POA: Insufficient documentation

## 2021-07-21 DIAGNOSIS — G8929 Other chronic pain: Secondary | ICD-10-CM | POA: Diagnosis not present

## 2021-07-21 DIAGNOSIS — M6281 Muscle weakness (generalized): Secondary | ICD-10-CM | POA: Insufficient documentation

## 2021-07-21 DIAGNOSIS — M25661 Stiffness of right knee, not elsewhere classified: Secondary | ICD-10-CM | POA: Insufficient documentation

## 2021-07-21 LAB — HM MAMMOGRAPHY: HM Mammogram: NORMAL (ref 0–4)

## 2021-07-21 NOTE — Therapy (Signed)
[facility] [address] [place], [place], [address] Phone: [phone]   Fax:  [phone]  Physical Therapy Treatment  Patient Details  Name: Kim Kim MRN: [id number] Date of Birth: 1956/01/03 Referring Provider (PT): Nyoka Cowden, MD   Encounter Date: 07/21/2021   PT End of Session - 07/21/21 1151     Visit Number 3    Number of Visits 12    Date for PT Re-Evaluation 08/08/21    Authorization Type BCBS    PT Start Time 1103    PT Stop Time 1151    PT Time Calculation (min) 48 min    Activity Tolerance Patient tolerated treatment well    Behavior During Therapy WFL for tasks assessed/performed             Past Medical History:  Diagnosis Date   Allergy    seasonal allergies   Anemia    hx of   Carotid atherosclerosis    Epilepsy ([place])    Gastric ulcer    hx of   GERD (gastroesophageal reflux disease)    on meds   Growth hormone deficiency ([place])    Hiatal hernia    Hiatal hernia    Hypothyroidism    hx of Hashimoto thyroiditis   Pituitary adenoma ([place])    hx of   Seizures ([place])    temporal lobe   Thyroid disease    on meds   Vitamin D deficiency     Past Surgical History:  Procedure Laterality Date   COLONOSCOPY  12/10/2015   [place] Dr Meredith Leeds   ESOPHAGOGASTRODUODENOSCOPY  06/03/2019   Dr Celso Sickle, [place] EXTRACTION      There were no vitals filed for this visit.   Subjective Assessment - 07/21/21 1105     Subjective Pt reports she has been absent from PT due to being on vacation.  She went to the beach and then went to [place].  Pt performed her HEP 3-4 times per week.  Pt states her aquatic session went very well and had no adverse effects after week.  Pt states she feels about the same, maybe a little worse since prior eval.  pt has not been able to perform her normal workout activities including aqua aerobics since she has been on vacation.  Pt states her R  quad is tighter closer to her patella.    Pertinent History Pt reports low end osteopenia, and denies osteoporosis.    Pain Score --   No pain with sitting.  She has pain with ambulation   Pain Location Knee    Pain Orientation Right    Aggravating Factors  stairs, activity    Pain Relieving Factors sitting                [facility] PT Assessment - 07/21/21 0001       ROM / Strength   AROM / PROM / Strength AROM      AROM   AROM Assessment Site Knee    Right/Left Knee Right    Right Knee Extension 4    Right Knee Flexion 121                           [facility] Adult PT Treatment/Exercise - 07/21/21 0001       Exercises   Exercises Knee/Hip   Reviewed response to prior Rx, current function, pain level, and HEP compliance.  Reviewed and  Updated HEP.  Assessed ROM.     Knee/Hip Exercises: Stretches   Other Knee/Hip Stretches Longsitting calf stretch with strap 3x20-30 sec      Knee/Hip Exercises: Supine   Other Supine Knee/Hip Exercises supine heel slides 2x10    Other Supine Knee/Hip Exercises supine SLR 2x10 reps, supine bridge 2x10 reps, quad set with heel elevated x 10 reps with 5 sec hold.  supine clam shell with RTB 2x10 reps      Knee/Hip Exercises: Sidelying   Hip ABduction Limitations 2x10      Manual Therapy   Manual Therapy Soft tissue mobilization    Manual therapy comments pt received supine R knee flexion and extension PROM    Soft tissue mobilization Rolling of R quad in supine to improve tightness and mobility                     PT Education - 07/21/21 1145     Education Details Reviewed and performed HEP.  Updated HEP and gave pt a HEP handout.  Educated pt in correct form and appropriate frequency.  Educatd pt she should not have increased pain with HEP.  Access Code: [email or web address]  URL: [email or web address]  Date: 07/21/2021  Prepared by: Ronny Flurry    Supine Heel Slide - 2 x daily - 7 x weekly - 1-2 sets - 10 reps   Hooklying Clamshell with Resistance - 1 x daily - 3-4 x weekly - 2 sets - 10 reps  Long Sitting Calf Stretch with Strap - 2 x daily - 7 x weekly - 1 sets - 2-3 reps - 20-30 seconds hold.  Assisted and educated pt with using [email or web address] App in order for pt to understand how to use the app for her home exercises.    Person(s) Educated Patient    Methods Explanation;Demonstration;Verbal cues;Handout    Comprehension Returned demonstration;Verbalized understanding              PT Short Term Goals - 06/27/21 2357       PT SHORT TERM GOAL #1   Title pt will be independent with land based HEP for improved ROM, pain, strength, and function.    Time 3    Period Weeks    Status New    Target Date 07/18/21      PT SHORT TERM GOAL #2   Title Pt will demo improved R knee extension AROM to 0 deg for improved stiffness and mobility.    Time 3    Period Weeks    Status New    Target Date 07/18/21      PT SHORT TERM GOAL #3   Title Pt will demo improved hip extension strength to 4/5 MMT bilat for improved mobility and tolerance with activities.    Time 4    Period Weeks    Status New    Target Date 07/25/21      PT SHORT TERM GOAL #4   Title Pt will tolerate aquatic exercises well without adverse effects for improved tolerance to activity.    Time 3    Period Weeks    Status New    Target Date 07/18/21               PT Long Term Goals - 06/27/21 2357       PT LONG TERM GOAL #1   Title Pt will perform a reciprocal gait on stairs without significant R knee pain.    Time 6  Period Weeks    Target Date 08/08/21      PT LONG TERM GOAL #2   Title Pt will report at least a 50% reduction in pain with activity.    Time 6    Period Weeks    Status New    Target Date 08/08/21      PT LONG TERM GOAL #3   Title Pt will be independent with aquatic program and be able to perform aquatic exercises without adverse effects to establish an aquatic program for improved strength, pain,  and tolerance to activity.    Time 6    Period Weeks    Status New    Target Date 08/08/21      PT LONG TERM GOAL #4   Title Pt will be able to perform walking program without adverse effects.    Time 6    Period Weeks    Status New    Target Date 08/08/21      PT LONG TERM GOAL #5   Title Pt will demo good understanding of appropriate workout activities and be able to perform workout activities without significant pain.    Time 6    Period Weeks    Status New    Target Date 08/08/21      Additional Long Term Goals   Additional Long Term Goals Yes      PT LONG TERM GOAL #6   Title Pt will demo 5/5 R hip flex and abd strength and at least 4+/5 hip extension strength for improved tolerance with and performance of functional mobility and workout activities.    Time 6    Period Weeks    Status New    Target Date 08/08/21                   Plan - 07/21/21 1123     Clinical Impression Statement Pt has been absent from PT due to going on vacation.  She has only received 1 aquatic visit and states she did well.  Pt presents to Rx with worse flexion and extesion AROM compared to initial evaluation.  Pt requires cuing and instruction in correct form with exercises/HEP including to control movement and increase height with SLRs.  Pt able to perform supine heel slides with improved comfort and tolerance though is limited.  PT updated HEP and gave pt a handout.  Pt demonstrates good understanding of HEP.  Pt responded well to Rx and did report soreness in knee after Rx.  Pt states she will use ice when she gets home.  Pt should benefit from skilled PT services to address ongoing goals, improve ROM and strength, and to assist in returning pt to PLOF.    Comorbidities Pt reports having low end osteopenia    PT Treatment/Interventions Aquatic Therapy;ADLs/Self Care Home Management;Cryotherapy;Electrical Stimulation;DME Instruction;Ultrasound;Moist Heat;Gait training;Stair  training;Functional mobility training;Therapeutic activities;Therapeutic exercise;Balance training;Neuromuscular re-education;Manual techniques;Patient/family education;Passive range of motion;Dry needling;Taping    PT Next Visit Plan Aquatic therapy next visit.  Progress hip and knee strength.  Improve knee extension ROM.    PT Home Exercise Plan Access Code: [email or web address]  URL: [email or web address]  Date: 07/21/2021  Prepared by: Ronny Flurry    Exercises  Supine Bridge - 1 x daily - 6 x weekly - 2 sets - 10 reps  Supine Active Straight Leg Raise - 1 x daily - 5-6 x weekly - 2 sets - 10 reps  Sidelying Hip Abduction - 1 x daily - 6 x weekly -  2 sets - 10 reps  Supine Heel Slide - 2 x daily - 7 x weekly - 1-2 sets - 10 reps  Hooklying Clamshell with Resistance - 1 x daily - 3-4 x weekly - 2 sets - 10 reps  Long Sitting Calf Stretch with Strap - 2 x daily - 7 x weekly - 1 sets - 2-3 reps - 20-30 seconds hold.    Consulted and Agree with Plan of Care Patient             Patient will benefit from skilled therapeutic intervention in order to improve the following deficits and impairments:  Decreased activity tolerance, Decreased range of motion, Decreased strength, Hypomobility, Decreased mobility, Pain  Visit Diagnosis: Chronic pain of right knee  Acute pain of right knee  Muscle weakness (generalized)  Stiffness of right knee, not elsewhere classified     Problem List Patient Active Problem List   Diagnosis Date Noted   Thoracic back pain 05/19/2020   Prediabetes 01/22/2020   Weight gain 01/22/2020   Osteopenia 01/22/2020   Osteoarthritis 07/17/2019   Family history of heart disease 12/11/2018   Atypical chest pain 12/11/2018   B12 deficiency 10/16/2017   Elevated BP without diagnosis of hypertension 10/01/2017   Osteoporosis 10/01/2017   Uterine fibroid 07/31/2017   History of cleft palate 11/22/2016   Velopharyngeal insufficiency (VPI), 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    Normocytic anemia 07/31/2016   Screen for colon cancer 07/31/2016   Iron deficiency 04/27/2016   Hypothyroidism 12/17/2012   Insulin resistance 12/17/2012   Auras 10/15/2012   Hashimoto's thyroiditis 07/24/2012   Hyperlipidemia 07/24/2012   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 [place], [place], [address] Phone: [phone]   Fax:  [phone]  Name: Kim Kim MRN: [id number] Date of Birth: 1956-06-11

## 2021-07-22 ENCOUNTER — Ambulatory Visit (HOSPITAL_BASED_OUTPATIENT_CLINIC_OR_DEPARTMENT_OTHER): Payer: BC Managed Care – PPO | Admitting: Physical Therapy

## 2021-07-22 DIAGNOSIS — M6281 Muscle weakness (generalized): Secondary | ICD-10-CM

## 2021-07-22 DIAGNOSIS — M25661 Stiffness of right knee, not elsewhere classified: Secondary | ICD-10-CM

## 2021-07-22 DIAGNOSIS — M25561 Pain in right knee: Secondary | ICD-10-CM

## 2021-07-22 DIAGNOSIS — G8929 Other chronic pain: Secondary | ICD-10-CM | POA: Diagnosis not present

## 2021-07-22 NOTE — Therapy (Signed)
[facility] [address] [place], [place], [address] Phone: [phone]   Fax:  [phone]  Physical Therapy Treatment  Patient Details  Name: Kim Kim MRN: [id number] Date of Birth: [birth year] Referring Provider (PT): Nyoka Cowden, MD   Encounter Date: 07/22/2021   PT End of Session - 07/22/21 0813     Visit Number 4    Number of Visits 12    Date for PT Re-Evaluation 08/08/21    Authorization Type BCBS    PT Start Time 0813    PT Stop Time 0900    PT Time Calculation (min) 47 min    Equipment Utilized During Treatment Other (comment)    Activity Tolerance Patient tolerated treatment well    Behavior During Therapy [facility] for tasks assessed/performed             Past Medical History:  Diagnosis Date   Allergy    seasonal allergies   Anemia    hx of   Carotid atherosclerosis    Epilepsy ([place])    Gastric ulcer    hx of   GERD (gastroesophageal reflux disease)    on meds   Growth hormone deficiency ([place])    Hiatal hernia    Hiatal hernia    Hypothyroidism    hx of Hashimoto thyroiditis   Pituitary adenoma ([place])    hx of   Seizures ([place])    temporal lobe   Thyroid disease    on meds   Vitamin D deficiency     Past Surgical History:  Procedure Laterality Date   COLONOSCOPY  12/10/2015   [place] Dr Meredith Leeds   ESOPHAGOGASTRODUODENOSCOPY  06/03/2019   Dr Celso Sickle, [place] EXTRACTION      There were no vitals filed for this visit.   Subjective Assessment - 07/22/21 1232     Subjective HAs been on vacation.  Doing well. Continmues to have knee pain but is staying active    Currently in Pain? No/denies    Pain Orientation Right                                          PT Short Term Goals - 06/27/21 2357       PT SHORT TERM GOAL #1   Title pt will be independent with land based HEP for improved ROM, pain, strength, and function.    Time 3     Period Weeks    Status New    Target Date 07/18/21      PT SHORT TERM GOAL #2   Title Pt will demo improved R knee extension AROM to 0 deg for improved stiffness and mobility.    Time 3    Period Weeks    Status New    Target Date 07/18/21      PT SHORT TERM GOAL #3   Title Pt will demo improved hip extension strength to 4/5 MMT bilat for improved mobility and tolerance with activities.    Time 4    Period Weeks    Status New    Target Date 07/25/21      PT SHORT TERM GOAL #4   Title Pt will tolerate aquatic exercises well without adverse effects for improved tolerance to activity.    Time 3    Period Weeks    Status New    Target Date  07/18/21               PT Long Term Goals - 06/27/21 2357       PT LONG TERM GOAL #1   Title Pt will perform a reciprocal gait on stairs without significant R knee pain.    Time 6    Period Weeks    Target Date 08/08/21      PT LONG TERM GOAL #2   Title Pt will report at least a 50% reduction in pain with activity.    Time 6    Period Weeks    Status New    Target Date 08/08/21      PT LONG TERM GOAL #3   Title Pt will be independent with aquatic program and be able to perform aquatic exercises without adverse effects to establish an aquatic program for improved strength, pain, and tolerance to activity.    Time 6    Period Weeks    Status New    Target Date 08/08/21      PT LONG TERM GOAL #4   Title Pt will be able to perform walking program without adverse effects.    Time 6    Period Weeks    Status New    Target Date 08/08/21      PT LONG TERM GOAL #5   Title Pt will demo good understanding of appropriate workout activities and be able to perform workout activities without significant pain.    Time 6    Period Weeks    Status New    Target Date 08/08/21      Additional Long Term Goals   Additional Long Term Goals Yes      PT LONG TERM GOAL #6   Title Pt will demo 5/5 R hip flex and abd strength and at  least 4+/5 hip extension strength for improved tolerance with and performance of functional mobility and workout activities.    Time 6    Period Weeks    Status New    Target Date 08/08/21            Pt seen for aquatic therapy today.  Treatment took place in water 3.25-4.8 ft in depth at the [facility] pool. Temp of water was 91.  Pt entered/exited the pool via stairs step to pattern independently with bilat rail.   Introduction to aquatic setting   Warm up: forward, backward and side stepping/walking cues for increased step length, increased speed, hand placement to increase resistance.   Seated Stretching: gastroc, hamstrings, 5 x 20 sec Flutter kick at knee sitting on water bench 20 secs 5 trials. Cues for tightened core. Knee flex/ext with ankle buoys 3 sets of 10 rep.  Cues for increasing speed for resistance.     Standing Stretching;Hip flex  and quad stretch using noodle at wall -Gastroc on bottom step 3 x 30 sec hold -LB stretch pike position at wall 3 x 30 seconds Strengthening; Hip flex x 10 reps pulling noodle toward wall -Noodle (blue)  kick down 3 x 15 reps supported by wall progressing to no UE support R/L. VC, demo and TC for core tightening, improved alignment and posture with activity. -squat on bottom step. VC and TC for proper execution 2 x 10 reps   Vertical suspension/standing Using 2 foam hand buoys pt engages core pulling knees to chest/Suspended Knee Tuck to Hip Flexion and Extension with Hand Floats 2 x 10 rep Suspended pike:  Hip Flexion and Extension  with Hand Floats x 10 reps Suspended in supine hip knee flex and ext tuck with hand floats x 10    Pt requires buoyancy for support and to offload joints with strengthening exercises. Viscosity of the water is needed for resistance of strengthening; water current perturbations provides challenge to standing balance unsupported, requiring increased core activation.       Plan - 07/22/21  0847     Clinical Impression Statement some soarness after last visit. Has participated in water aerobic since last visit. Focus on LE and core strength. HEP completed with aquatic exercises and laminated for pt to complete.  She tolerates increased reistive challenges including core with knee srengthening.    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 [employer] - 1 x daily - 7 x weekly - 3 sets - 10 reps  Suspended Forward Bicycle Kick with Hand Floats - 1 x daily - 7 x weekly - 3 sets - 10 reps  Hamstring Stretch with Noodle - 1 x daily - 7 x weekly - 3 sets - 10 reps  Seated Straddle on Flotation Forward Breast Stroke Arms and Bicycle Legs - 1 x daily - 7 x weekly - 3 sets - 10 reps  Hip Flexor Stretch with Noodle - 1 x daily - 7 x weekly - 3 sets - 10 reps  Suspended Knee Tuck to Hip Flexion and Extension with Hand Floats - 1 x daily - 7 x weekly - 3 sets - 10 reps  Standing Balance on Noodle at [employer] - 1 x daily - 7 x weekly - 3 sets - 10 reps   URL: [email or web address]  Date: 07/21/2021  Prepared by: Ronny Flurry    Exercises  Supine Bridge - 1 x daily - 6 x weekly - 2 sets - 10 reps  Supine Active Straight Leg Raise - 1 x daily - 5-6 x weekly - 2 sets - 10 reps  Sidelying Hip Abduction - 1 x daily - 6 x weekly - 2 sets - 10 reps  Supine Heel Slide - 2 x daily - 7 x weekly - 1-2 sets - 10 reps   Hooklying Clamshell with Resistance - 1 x daily - 3-4 x weekly - 2 sets - 10 reps  Long Sitting Calf Stretch with Strap - 2 x daily - 7 x weekly - 1 sets - 2-3 reps - 20-30 seconds hold.             Patient will benefit from skilled therapeutic intervention in order to improve the following deficits and impairments:  Decreased activity tolerance, Decreased range of motion, Decreased strength, Hypomobility, Decreased mobility, Pain  Visit Diagnosis: Chronic pain of right knee  Acute pain of right knee  Muscle weakness (generalized)  Stiffness of right knee, not elsewhere classified     Problem List Patient Active Problem List   Diagnosis Date Noted   Thoracic back pain 05/19/2020   Prediabetes 01/22/2020   Weight gain 01/22/2020   Osteopenia 01/22/2020   Osteoarthritis 07/17/2019   Family history of heart disease 12/11/2018   Atypical chest pain  12/11/2018   B12 deficiency 10/16/2017   Elevated BP without diagnosis of hypertension 10/01/2017   Osteoporosis 10/01/2017   Uterine fibroid 07/31/2017   History of cleft palate 11/22/2016   Velopharyngeal insufficiency (VPI), 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   Normocytic anemia 07/31/2016   Screen for colon cancer 07/31/2016   Iron deficiency 04/27/2016   Hypothyroidism 12/17/2012   Insulin resistance 12/17/2012   Auras 10/15/2012   Hashimoto's thyroiditis 07/24/2012   Hyperlipidemia 07/24/2012   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 [place], [place], [address] Phone: [phone]   Fax:  [phone]  Name: Phyillis Dascoli MRN: [id number] Date of Birth: January 12, 1956

## 2021-07-26 ENCOUNTER — Other Ambulatory Visit: Payer: Self-pay

## 2021-07-26 ENCOUNTER — Encounter (INDEPENDENT_AMBULATORY_CARE_PROVIDER_SITE_OTHER): Payer: Self-pay | Admitting: Family Medicine

## 2021-07-26 ENCOUNTER — Ambulatory Visit (INDEPENDENT_AMBULATORY_CARE_PROVIDER_SITE_OTHER): Payer: BC Managed Care – PPO | Admitting: Family Medicine

## 2021-07-26 VITALS — BP 115/72 | HR 72 | Temp 97.9°F | Ht 63.0 in | Wt 166.0 lb

## 2021-07-26 DIAGNOSIS — E7849 Other hyperlipidemia: Secondary | ICD-10-CM

## 2021-07-26 DIAGNOSIS — Z6830 Body mass index (BMI) 30.0-30.9, adult: Secondary | ICD-10-CM | POA: Diagnosis not present

## 2021-07-26 DIAGNOSIS — R7303 Prediabetes: Secondary | ICD-10-CM

## 2021-07-26 DIAGNOSIS — E66811 Obesity, class 1: Secondary | ICD-10-CM

## 2021-07-26 DIAGNOSIS — E669 Obesity, unspecified: Secondary | ICD-10-CM

## 2021-07-26 NOTE — Progress Notes (Signed)
Chief Complaint:   OBESITY Kim Kim is here to discuss her progress with her obesity treatment plan along with follow-up of her obesity related diagnoses. Kim Kim is on the Category 1 Plan and states she is following her eating plan approximately 50% of the time. Kim Kim states she is doing 0 minutes 0 times per week.  Today's visit was #: 2 Starting weight: 166 lbs Starting date: 07/12/2021 Today's weight: 166 lbs Today's date: 07/26/2021 Total lbs lost to date: 0 Total lbs lost since last in-office visit: 0  Interim History: Kim Kim was on vacation much of the last 2 weeks and she tried to stay on track, but naturally it was challenging.  Subjective:   1. Pre-diabetes Kim Kim has a new diagnosis of pre-diabetes. Her A1c is 5.7 and fasting insulin is > than 5. She has been struggling with weight loss for a while. I discussed labs with the patient today.  2. Other hyperlipidemia Kim Kim is stable on Crestor, and she is working on diet, exercise, and weight loss. She denies chest pain. I discussed labs with the patient today.  Assessment/Plan:   1. Pre-diabetes Kim Kim was educated on the nature of pre-diabetes and how it affects her weight. She will continue to work on increasing protein and decreasing calories and simple carbohydrates to help decrease the risk of diabetes.   2. Other hyperlipidemia Cardiovascular risk and specific lipid/LDL goals reviewed. We discussed several lifestyle modifications today and [name] will continue to work on diet, exercise and weight loss efforts. We will recheck labs in 3 months. Orders and follow up as documented in patient record.   3. Obesity with current BMI of 29.5 Kim Kim is currently in the action stage of change. As such, her goal is to continue with weight loss efforts. She has agreed to change to keeping a food journal and adhering to recommended goals of 1100 calories and 75+ grams of protein daily.   Behavioral modification strategies: increasing  lean protein intake and keeping a strict food journal.  Kim Kim has agreed to follow-up with our clinic in 2 to 3 weeks with [name], NP. She was informed of the importance of frequent follow-up visits to maximize her success with intensive lifestyle modifications for her multiple health conditions.   Objective:   Blood pressure 115/72, pulse 72, temperature 97.9 F (36.6 C), height 5\' 3"  (1.6 m), weight 166 lb (75.3 kg), SpO2 99 %. Body mass index is 29.41 kg/m.  General: Cooperative, alert, well developed, in no acute distress. HEENT: Conjunctivae and lids unremarkable. Cardiovascular: Regular rhythm.  Lungs: Normal work of breathing. Neurologic: No focal deficits.   Lab Results  Component Value Date   CREATININE 0.99 07/12/2021   BUN 16 07/12/2021   NA 140 07/12/2021   K 4.6 07/12/2021   CL 98 07/12/2021   CO2 26 07/12/2021   Lab Results  Component Value Date   ALT 16 07/12/2021   AST 26 07/12/2021   ALKPHOS 119 07/12/2021   BILITOT 0.4 07/12/2021   Lab Results  Component Value Date   HGBA1C 6.1 09/06/2020   HGBA1C 5.8 01/07/2020   Lab Results  Component Value Date   INSULIN 9.5 07/12/2021   Lab Results  Component Value Date   TSH 2.27 09/06/2020   No results found for: CHOL, HDL, LDLCALC, LDLDIRECT, TRIG, CHOLHDL Lab Results  Component Value Date   VD25OH 36.99 09/06/2020   VD25OH 34.95 01/07/2020   Lab Results  Component Value Date   WBC 7.1 09/29/2020  HGB 12.0 09/29/2020   HCT 36.1 09/29/2020   MCV 80.8 09/29/2020   PLT 242.0 09/29/2020   Lab Results  Component Value Date   IRON 44 (L) 09/29/2020   TIBC 360 09/29/2020   FERRITIN 22.7 09/06/2020   Attestation Statements:   Reviewed by clinician on day of visit: allergies, medications, problem list, medical history, surgical history, family history, social history, and previous encounter notes.  Time spent on visit including pre-visit chart review and post-visit care and charting was 46  minutes.    I, Trixie Dredge, am acting as transcriptionist for Dennard Nip, MD.  I have reviewed the above documentation for accuracy and completeness, and I agree with the above. -  Dennard Nip, MD

## 2021-07-27 ENCOUNTER — Encounter (HOSPITAL_BASED_OUTPATIENT_CLINIC_OR_DEPARTMENT_OTHER): Payer: Self-pay | Admitting: Physical Therapy

## 2021-07-27 ENCOUNTER — Ambulatory Visit (HOSPITAL_BASED_OUTPATIENT_CLINIC_OR_DEPARTMENT_OTHER): Payer: BC Managed Care – PPO | Admitting: Physical Therapy

## 2021-07-27 DIAGNOSIS — M6281 Muscle weakness (generalized): Secondary | ICD-10-CM | POA: Diagnosis not present

## 2021-07-27 DIAGNOSIS — M25561 Pain in right knee: Secondary | ICD-10-CM

## 2021-07-27 DIAGNOSIS — G8929 Other chronic pain: Secondary | ICD-10-CM

## 2021-07-27 DIAGNOSIS — M25661 Stiffness of right knee, not elsewhere classified: Secondary | ICD-10-CM | POA: Diagnosis not present

## 2021-07-27 NOTE — Therapy (Signed)
[facility] [address] [place], [place], [address] Phone: [phone]   Fax:  [phone]  Physical Therapy Treatment  Patient Details  Name: Kim Kim MRN: [id number] Date of Birth: 08-Jul-1956 Referring Provider (PT): Nyoka Cowden, MD   Encounter Date: 07/27/2021   PT End of Session - 07/27/21 1512     Visit Number 5    Number of Visits 12    Date for PT Re-Evaluation 08/08/21    Authorization Type BCBS    PT Start Time 59    PT Stop Time 1545    PT Time Calculation (min) 40 min    Equipment Utilized During Treatment Other (comment)    Activity Tolerance Patient tolerated treatment well    Behavior During Therapy WFL for tasks assessed/performed             Past Medical History:  Diagnosis Date   Allergy    seasonal allergies   Anemia    hx of   Carotid atherosclerosis    Epilepsy ([place])    Gastric ulcer    hx of   GERD (gastroesophageal reflux disease)    on meds   Growth hormone deficiency ([place])    Hiatal hernia    Hiatal hernia    Hypothyroidism    hx of Hashimoto thyroiditis   Pituitary adenoma ([place])    hx of   Seizures ([place])    temporal lobe   Thyroid disease    on meds   Vitamin D deficiency     Past Surgical History:  Procedure Laterality Date   COLONOSCOPY  12/10/2015   [place] Dr Meredith Leeds   ESOPHAGOGASTRODUODENOSCOPY  06/03/2019   Dr Celso Sickle, [place] EXTRACTION      There were no vitals filed for this visit.   Subjective Assessment - 07/27/21 1831     Subjective "Some soarness fr about a day after last session"    Currently in Pain? Yes    Pain Score 1     Pain Orientation Right    Pain Descriptors / Indicators Aching    Pain Type Chronic pain    Pain Onset More than a month ago    Pain Frequency Intermittent                                          PT Short Term Goals - 07/27/21 1841       PT SHORT TERM GOAL  #1   Baseline 07-27-21 Pt indep with aquatic HEP    Status On-going      PT SHORT TERM GOAL #2   Baseline R knee extension 0 deg, reports less stiffness    Status Achieved      PT SHORT TERM GOAL #4   Baseline Pt tolerating aquatics well without adverse effects.  Has had only posite effects in ROM strength and balance.    Status Achieved               PT Long Term Goals - 06/27/21 2357       PT LONG TERM GOAL #1   Title Pt will perform a reciprocal gait on stairs without significant R knee pain.    Time 6    Period Weeks    Target Date 08/08/21      PT LONG TERM GOAL #2   Title Pt will report at  least a 50% reduction in pain with activity.    Time 6    Period Weeks    Status New    Target Date 08/08/21      PT LONG TERM GOAL #3   Title Pt will be independent with aquatic program and be able to perform aquatic exercises without adverse effects to establish an aquatic program for improved strength, pain, and tolerance to activity.    Time 6    Period Weeks    Status New    Target Date 08/08/21      PT LONG TERM GOAL #4   Title Pt will be able to perform walking program without adverse effects.    Time 6    Period Weeks    Status New    Target Date 08/08/21      PT LONG TERM GOAL #5   Title Pt will demo good understanding of appropriate workout activities and be able to perform workout activities without significant pain.    Time 6    Period Weeks    Status New    Target Date 08/08/21      Additional Long Term Goals   Additional Long Term Goals Yes      PT LONG TERM GOAL #6   Title Pt will demo 5/5 R hip flex and abd strength and at least 4+/5 hip extension strength for improved tolerance with and performance of functional mobility and workout activities.    Time 6    Period Weeks    Status New    Target Date 08/08/21            Pt seen for aquatic therapy today.  Treatment took place in water 3.25-4.8 ft in depth at the [facility] pool.  Temp of water was 91.  Pt entered/exited the pool via stairs step to pattern independently with bilat rail.   Introduction to aquatic setting   Warm up: forward, backward and side stepping/walking cues for increased step length, increased speed, hand placement to increase resistance.    Standing Stretching -Hip flex  and quad stretch using noodle at wall 3 x 30 sec hold -Gastroc and hamstring "runners stretch" on  3 x 30 sec hold -LB stretch pike position at wall 3 x 30 seconds Strengthening: -Hip flex x 10 reps pulling noodle toward wall -Squoodle (progression from noodle greater resitance)kick down 3 x 15 reps supported by wall progressing to no UE support R/L. VC, demo and TC for core tightening, improved alignment and posture with activity. -squat on bottom step. VC and TC for proper execution 12 reps  Balance and core strength challenges standing with wide BOS on noodle.  Gaining static balance with heels and then toes on ground x 30 sec (requiring many trails); Then progressed to rotating over noodle gaining balance heel and toe x 5 reps.  Decreased BOS repeated. -pt challenged to gain balance on noodle.Best try 10 sec -Tandem standing on noodle.  Best try after several attempts x 8 sec      Pt requires buoyancy for support and to offload joints with strengthening exercises. Viscosity of the water is needed for resistance of strengthening; water current perturbations provides challenge to standing balance unsupported, requiring increased core activation.  Patient will benefit from skilled therapeutic intervention in order to improve the following deficits and impairments:  Decreased activity tolerance, Decreased range of motion, Decreased strength, Hypomobility, Decreased mobility, Pain  Visit Diagnosis: Chronic pain of right knee  Acute pain of right  knee  Muscle weakness (generalized)  Stiffness of right knee, not elsewhere classified     Problem List Patient Active  Problem List   Diagnosis Date Noted   Thoracic back pain 05/19/2020   Prediabetes 01/22/2020   Weight gain 01/22/2020   Osteopenia 01/22/2020   Osteoarthritis 07/17/2019   Family history of heart disease 12/11/2018   Atypical chest pain 12/11/2018   B12 deficiency 10/16/2017   Elevated BP without diagnosis of hypertension 10/01/2017   Osteoporosis 10/01/2017   Uterine fibroid 07/31/2017   History of cleft palate 11/22/2016   Velopharyngeal insufficiency (VPI), 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   Normocytic anemia 07/31/2016   Screen for colon cancer 07/31/2016   Iron deficiency 04/27/2016   Hypothyroidism 12/17/2012   Insulin resistance 12/17/2012   Auras 10/15/2012   Hashimoto's thyroiditis 07/24/2012   Hyperlipidemia 07/24/2012   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 Pt demonstrates improvesment with core strength and balance awareness with progression to squoodle and successful attempts of newchallenges. Pt has recieved laminated HEP. She will bring with her each visit for me to add as appropriate  Annamarie Major) Edwardine Deschepper MPT   07/27/2021, 6:44 PM  [facility] [address] [place], [place], [address] Phone: [phone]   Fax:  [phone]  Name: Auden Wettstein MRN: [id number] Date of Birth: 1956/04/13

## 2021-07-28 ENCOUNTER — Ambulatory Visit: Payer: BC Managed Care – PPO | Admitting: Sports Medicine

## 2021-07-29 ENCOUNTER — Ambulatory Visit (HOSPITAL_BASED_OUTPATIENT_CLINIC_OR_DEPARTMENT_OTHER): Payer: BC Managed Care – PPO | Admitting: Physical Therapy

## 2021-07-29 ENCOUNTER — Encounter (HOSPITAL_BASED_OUTPATIENT_CLINIC_OR_DEPARTMENT_OTHER): Payer: Self-pay

## 2021-08-01 ENCOUNTER — Other Ambulatory Visit (HOSPITAL_BASED_OUTPATIENT_CLINIC_OR_DEPARTMENT_OTHER): Payer: Self-pay

## 2021-08-01 ENCOUNTER — Encounter (HOSPITAL_BASED_OUTPATIENT_CLINIC_OR_DEPARTMENT_OTHER): Payer: Self-pay | Admitting: Physical Therapy

## 2021-08-01 ENCOUNTER — Ambulatory Visit (HOSPITAL_BASED_OUTPATIENT_CLINIC_OR_DEPARTMENT_OTHER): Payer: BC Managed Care – PPO | Attending: Family Medicine | Admitting: Physical Therapy

## 2021-08-01 ENCOUNTER — Other Ambulatory Visit: Payer: Self-pay

## 2021-08-01 ENCOUNTER — Ambulatory Visit: Payer: BC Managed Care – PPO | Attending: Internal Medicine

## 2021-08-01 DIAGNOSIS — R293 Abnormal posture: Secondary | ICD-10-CM | POA: Diagnosis not present

## 2021-08-01 DIAGNOSIS — M6281 Muscle weakness (generalized): Secondary | ICD-10-CM | POA: Insufficient documentation

## 2021-08-01 DIAGNOSIS — G8929 Other chronic pain: Secondary | ICD-10-CM | POA: Insufficient documentation

## 2021-08-01 DIAGNOSIS — Z23 Encounter for immunization: Secondary | ICD-10-CM

## 2021-08-01 DIAGNOSIS — M25661 Stiffness of right knee, not elsewhere classified: Secondary | ICD-10-CM | POA: Insufficient documentation

## 2021-08-01 DIAGNOSIS — R2681 Unsteadiness on feet: Secondary | ICD-10-CM | POA: Insufficient documentation

## 2021-08-01 DIAGNOSIS — R2689 Other abnormalities of gait and mobility: Secondary | ICD-10-CM | POA: Insufficient documentation

## 2021-08-01 DIAGNOSIS — K6389 Other specified diseases of intestine: Secondary | ICD-10-CM | POA: Diagnosis not present

## 2021-08-01 DIAGNOSIS — R262 Difficulty in walking, not elsewhere classified: Secondary | ICD-10-CM | POA: Insufficient documentation

## 2021-08-01 DIAGNOSIS — M25561 Pain in right knee: Secondary | ICD-10-CM | POA: Insufficient documentation

## 2021-08-01 MED ORDER — PFIZER COVID-19 VAC BIVALENT 30 MCG/0.3ML IM SUSP
INTRAMUSCULAR | 0 refills | Status: DC
  Filled 2021-08-01: qty 0.3, 1d supply, fill #0

## 2021-08-01 NOTE — Progress Notes (Signed)
   Covid-19 Vaccination Clinic  Name:  Kim Kim    MRN: [id number] DOB: 04-17-1956  08/01/2021  [name] was observed post Covid-19 immunization for 15 minutes without incident. She was provided with Vaccine Information Sheet and instruction to access the V-Safe system.   [name] was instructed to call 911 with any severe reactions post vaccine: Difficulty breathing  Swelling of face and throat  A fast heartbeat  A bad rash all over body  Dizziness and weakness   Immunizations Administered     Name Date Dose VIS Date Route   Pfizer Covid-19 Vaccine Bivalent Booster 08/01/2021 11:01 AM 0.3 mL 06/29/2021 Intramuscular   Manufacturer: [place]   Lot: [id number]   [place]: [phone]

## 2021-08-01 NOTE — Therapy (Signed)
[facility] [address] [place], [place], [address] Phone: [phone]   Fax:  [phone]  Physical Therapy Treatment/Recertification  Patient Details  Name: Kim Kim MRN: [id number] Date of Birth: Aug 11, 1956 Referring Provider (PT): Kim Cowden, MD   Encounter Date: 08/01/2021   PT End of Session - 08/01/21 0930     Visit Number 6    Number of Visits 18    Date for PT Re-Evaluation 09/16/21    Authorization Type BCBS    PT Start Time 0930    PT Stop Time 1013    PT Time Calculation (min) 43 min    Activity Tolerance Patient tolerated treatment well    Behavior During Therapy WFL for tasks assessed/performed             Past Medical History:  Diagnosis Date   Allergy    seasonal allergies   Anemia    hx of   Carotid atherosclerosis    Epilepsy ([place])    Gastric ulcer    hx of   GERD (gastroesophageal reflux disease)    on meds   Growth hormone deficiency ([place])    Hiatal hernia    Hiatal hernia    Hypothyroidism    hx of Hashimoto thyroiditis   Pituitary adenoma ([place])    hx of   Seizures ([place])    temporal lobe   Thyroid disease    on meds   Vitamin D deficiency     Past Surgical History:  Procedure Laterality Date   COLONOSCOPY  12/10/2015   [place] Dr Meredith Kim   ESOPHAGOGASTRODUODENOSCOPY  06/03/2019   Dr Celso Sickle, [place] EXTRACTION      There were no vitals filed for this visit.   Subjective Assessment - 08/01/21 0931     Subjective It is definitely better. the water aerobics is great.    Patient Stated Goals understand what she can and can't do especially in the water.  pt wants to make sure that she is not doing anything that increases her pain or sx's.    Currently in Pain? No/denies    Aggravating Factors  I felt it a little after about 2 miles    Pain Relieving Factors rest                [facility] PT Assessment - 08/01/21 0001       Assessment    Medical Diagnosis Primary OA of R knee    Referring Provider (PT) Kim Cowden, MD    Onset Date/Surgical Date --   2019   Hand Dominance Right    Prior Therapy Pt has received PT for knee twice in the past      Restrictions   Weight Bearing Restrictions No      Balance Screen   Has the patient fallen in the past 6 months No      Prior Function   Level of Independence Independent      Cognition   Overall Cognitive Status Within Functional Limits for tasks assessed      Observation/Other Assessments   Other Surveys  Lower Extremity Functional Scale    Lower Extremity Functional Scale  40/80      Strength   Right Hip Flexion 4/5    Left Hip Flexion 4/5      Ambulation/Gait   Gait Comments mild widened BOS  [facility] PT Treatment/Exercise - 08/01/21 0001       Exercises   Exercises Other Exercises    Other Exercises  ab set- supine, seated, standing      Knee/Hip Exercises: Supine   Other Supine Knee/Hip Exercises SIJ MET- Rt hip flexors/Lt extensors with dowel                     PT Education - 08/01/21 1612     Education Details goals review, POC, importance of proximal stability    Person(s) Educated Patient    Methods Explanation;Demonstration;Verbal cues;Handout;Tactile cues    Comprehension Verbalized understanding;Returned demonstration;Verbal cues required;Tactile cues required;Need further instruction              PT Short Term Goals - 07/27/21 1841       PT SHORT TERM GOAL #1   Baseline 07-27-21 Pt indep with aquatic HEP    Status On-going      PT SHORT TERM GOAL #2   Baseline R knee extension 0 deg, reports less stiffness    Status Achieved      PT SHORT TERM GOAL #4   Baseline Pt tolerating aquatics well without adverse effects.  Has had only posite effects in ROM strength and balance.    Status Achieved               PT Long Term Goals - 08/01/21 0936       PT LONG TERM  GOAL #1   Title Pt will perform a reciprocal gait on stairs without significant R knee pain.    Baseline muscle pulling, try to avoid stairs; hills are better than steps but still uncomfortable    Time 6    Period Weeks    Status On-going    Target Date 09/16/21      PT LONG TERM GOAL #2   Title Pt will report at least a 50% reduction in pain with activity.    Baseline not yet    Time 6    Period Weeks    Status On-going    Target Date 09/16/21      PT LONG TERM GOAL #3   Title Pt will be independent with aquatic program and be able to perform aquatic exercises without adverse effects to establish an aquatic program for improved strength, pain, and tolerance to activity.    Baseline doing okay as of now but requires further progression    Time 6    Period Weeks    Status On-going    Target Date 09/16/21      PT LONG TERM GOAL #4   Title Pt will be able to perform walking program without adverse effects.    Baseline discomfort at 2 mi today    Time 6    Period Weeks    Status On-going    Target Date 09/16/21      PT LONG TERM GOAL #5   Title Pt will demo good understanding of appropriate workout activities and be able to perform workout activities without significant pain.    Baseline requires progression    Time 6    Period Weeks    Status On-going    Target Date 09/16/21      PT LONG TERM GOAL #6   Title Pt will demo 5/5 R hip flex and abd strength and at least 4+/5 hip extension strength for improved tolerance with and performance of functional mobility and workout activities.    Baseline see  flowsheet    Time 6    Period Weeks    Status On-going                   Plan - 08/01/21 0957     Clinical Impression Statement innominate rotation (Rt post) noted with functional LLD (Rt longer) resulting in abnormal pressures/forces through knee. will benefit from lumbopelvic stability. Extended POC as she has been out for a bit while traveling and will cont to  benefit from treatments to achieve long term functional goals.    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 [employer] - 1 x daily - 7 x weekly - 3 sets - 10 reps  Suspended Forward Bicycle Kick with Hand Floats - 1 x daily - 7 x weekly - 3 sets - 10 reps  Hamstring Stretch with Noodle - 1 x daily - 7 x weekly - 3 sets - 10 reps  Seated Straddle on Flotation Forward Breast Stroke Arms and Bicycle Legs - 1 x daily - 7 x weekly - 3 sets - 10 reps  Hip Flexor Stretch with Noodle - 1 x daily - 7 x weekly - 3 sets - 10 reps  Suspended Knee Tuck to Hip Flexion and Extension with Hand Floats - 1 x daily - 7 x weekly - 3 sets - 10 reps  Standing Balance on Noodle at [employer] - 1 x daily - 7 x weekly - 3 sets - 10 reps   URL: [email or web address]  Date: 07/21/2021  Prepared by: Ronny Flurry    Exercises  Supine Bridge - 1 x daily - 6 x weekly - 2 sets - 10 reps  Supine Active Straight Leg Raise - 1 x daily - 5-6 x weekly - 2 sets - 10 reps  Sidelying Hip Abduction - 1 x daily - 6 x weekly - 2 sets - 10 reps  Supine Heel Slide - 2 x daily - 7 x weekly - 1-2 sets - 10 reps  Hooklying Clamshell with Resistance - 1 x daily - 3-4 x weekly - 2 sets - 10 reps  Long Sitting Calf Stretch with Strap - 2 x daily - 7 x weekly - 1 sets - 2-3 reps - 20-30 seconds hold.    Consulted and Agree with Plan of Care Patient             Patient will benefit from  skilled therapeutic intervention in order to improve the following deficits and impairments:  Decreased activity tolerance, Decreased range of motion, Decreased strength, Hypomobility, Decreased mobility, Pain  Visit Diagnosis: Chronic pain of right knee - Plan: PT plan of care cert/re-cert  Acute pain of right knee - Plan: PT plan of care cert/re-cert  Muscle weakness (generalized) - Plan: PT plan of care cert/re-cert  Stiffness of right knee, not elsewhere classified - Plan: PT plan of care cert/re-cert     Problem List Patient Active Problem List   Diagnosis Date Noted   Thoracic back pain 05/19/2020   Prediabetes 01/22/2020   Weight gain 01/22/2020   Osteopenia 01/22/2020   Osteoarthritis 07/17/2019   Family history of  heart disease 12/11/2018   Atypical chest pain 12/11/2018   B12 deficiency 10/16/2017   Elevated BP without diagnosis of hypertension 10/01/2017   Osteoporosis 10/01/2017   Uterine fibroid 07/31/2017   History of cleft palate 11/22/2016   Velopharyngeal insufficiency (VPI), 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   Normocytic anemia 07/31/2016   Screen for colon cancer 07/31/2016   Iron deficiency 04/27/2016   Hypothyroidism 12/17/2012   Insulin resistance 12/17/2012   Auras 10/15/2012   Hashimoto's thyroiditis 07/24/2012   Hyperlipidemia 07/24/2012   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 [employer] Null PT, DPT 08/01/21 4:17 PM  [facility] [address] [place], [place], [address] Phone:  [phone]   Fax:  [phone]  Name: Isobelle Tuckett MRN: [id number] Date of Birth: Apr 14, 1956

## 2021-08-05 ENCOUNTER — Other Ambulatory Visit: Payer: Self-pay

## 2021-08-05 ENCOUNTER — Ambulatory Visit (HOSPITAL_BASED_OUTPATIENT_CLINIC_OR_DEPARTMENT_OTHER): Payer: BC Managed Care – PPO | Admitting: Physical Therapy

## 2021-08-05 ENCOUNTER — Encounter (HOSPITAL_BASED_OUTPATIENT_CLINIC_OR_DEPARTMENT_OTHER): Payer: Self-pay | Admitting: Physical Therapy

## 2021-08-05 DIAGNOSIS — M25661 Stiffness of right knee, not elsewhere classified: Secondary | ICD-10-CM

## 2021-08-05 DIAGNOSIS — M25561 Pain in right knee: Secondary | ICD-10-CM

## 2021-08-05 DIAGNOSIS — R293 Abnormal posture: Secondary | ICD-10-CM | POA: Diagnosis not present

## 2021-08-05 DIAGNOSIS — M6281 Muscle weakness (generalized): Secondary | ICD-10-CM | POA: Diagnosis not present

## 2021-08-05 DIAGNOSIS — G8929 Other chronic pain: Secondary | ICD-10-CM

## 2021-08-05 DIAGNOSIS — R262 Difficulty in walking, not elsewhere classified: Secondary | ICD-10-CM | POA: Diagnosis not present

## 2021-08-05 DIAGNOSIS — R2689 Other abnormalities of gait and mobility: Secondary | ICD-10-CM | POA: Diagnosis not present

## 2021-08-05 DIAGNOSIS — R2681 Unsteadiness on feet: Secondary | ICD-10-CM | POA: Diagnosis not present

## 2021-08-05 NOTE — Therapy (Signed)
[facility] [address] [place], [place], [address] Phone: [phone]   Fax:  [phone]  Physical Therapy Treatment  Patient Details  Name: Kim Kim MRN: [id number] Date of Birth: [birth year] Referring Provider (PT): Nyoka Cowden, MD   Encounter Date: 08/05/2021   PT End of Session - 08/05/21 1122     Visit Number 7    Number of Visits 18    Date for PT Re-Evaluation 09/16/21    Authorization Type BCBS    PT Start Time 0815    PT Stop Time 0900    PT Time Calculation (min) 45 min    Activity Tolerance Patient tolerated treatment well    Behavior During Therapy [facility] for tasks assessed/performed             Past Medical History:  Diagnosis Date   Allergy    seasonal allergies   Anemia    hx of   Carotid atherosclerosis    Epilepsy ([place])    Gastric ulcer    hx of   GERD (gastroesophageal reflux disease)    on meds   Growth hormone deficiency ([place])    Hiatal hernia    Hiatal hernia    Hypothyroidism    hx of Hashimoto thyroiditis   Pituitary adenoma ([place])    hx of   Seizures ([place])    temporal lobe   Thyroid disease    on meds   Vitamin D deficiency     Past Surgical History:  Procedure Laterality Date   COLONOSCOPY  12/10/2015   [place] Meredith Leeds   ESOPHAGOGASTRODUODENOSCOPY  06/03/2019   Dr Celso Sickle, [place] EXTRACTION      There were no vitals filed for this visit.   Subjective Assessment - 08/05/21 1120     Subjective "some tightness around knee"    Currently in Pain? No/denies                                        PT Education - 08/05/21 1121     Education Details Aquatics HEP    Person(s) Educated Patient    Methods Explanation    Comprehension Verbalized understanding;Returned demonstration              PT Short Term Goals - 07/27/21 1841       PT SHORT TERM GOAL #1   Baseline 07-27-21 Pt indep with aquatic HEP     Status On-going      PT SHORT TERM GOAL #2   Baseline R knee extension 0 deg, reports less stiffness    Status Achieved      PT SHORT TERM GOAL #4   Baseline Pt tolerating aquatics well without adverse effects.  Has had only posite effects in ROM strength and balance.    Status Achieved               PT Long Term Goals - 08/01/21 0936       PT LONG TERM GOAL #1   Title Pt will perform a reciprocal gait on stairs without significant R knee pain.    Baseline muscle pulling, try to avoid stairs; hills are better than steps but still uncomfortable    Time 6    Period Weeks    Status On-going    Target Date 09/16/21      PT LONG TERM GOAL #2  Title Pt will report at least a 50% reduction in pain with activity.    Baseline not yet    Time 6    Period Weeks    Status On-going    Target Date 09/16/21      PT LONG TERM GOAL #3   Title Pt will be independent with aquatic program and be able to perform aquatic exercises without adverse effects to establish an aquatic program for improved strength, pain, and tolerance to activity.    Baseline doing okay as of now but requires further progression    Time 6    Period Weeks    Status On-going    Target Date 09/16/21      PT LONG TERM GOAL #4   Title Pt will be able to perform walking program without adverse effects.    Baseline discomfort at 2 mi today    Time 6    Period Weeks    Status On-going    Target Date 09/16/21      PT LONG TERM GOAL #5   Title Pt will demo good understanding of appropriate workout activities and be able to perform workout activities without significant pain.    Baseline requires progression    Time 6    Period Weeks    Status On-going    Target Date 09/16/21      PT LONG TERM GOAL #6   Title Pt will demo 5/5 R hip flex and abd strength and at least 4+/5 hip extension strength for improved tolerance with and performance of functional mobility and workout activities.    Baseline see  flowsheet    Time 6    Period Weeks    Status On-going            Pt seen for aquatic therapy today.  Treatment took place in water 3.25-4.8 ft in depth at the [facility] pool. Temp of water was 94.  Pt entered/exited the pool via stairs step to pattern independently with bilat rail.     Warm up: forward, backward and side stepping/walking cues for increased step length, increased speed, hand placement to increase resistance.     Standing Stretching -Hip flex  and quad stretch using noodle at wall 3 x 30 sec hold -Gastroc and hamstring "runners stretch" on  3 x 30 sec hold -LB stretch pike position at wall 3 x 30 seconds -Stretching same muscles using noodle for extended stretch. Strengthening: -Hip flex x 10 reps pulling noodle toward wall -Squoodle kick down 3 x 15 reps supported by wall progressing to no UE support R/L. VC for core tightening, improved alignment and posture with activity   Balance and core strength challenges standing with wide BOS on noodle.  Gaining static balance with heels and then toes on ground x 30 sec (requiring many trails); Then progressed to rotating over noodle gaining balance heel and toe x 5 reps.  Decreased BOS repeated. -pt challenged to gain balance on noodle.Best try 15 sec.  Added challenge to raise ue out of water and maintain balance.  Pt unable to complete -Tandem standing on noodle.  Best try after several attempts x 10 sec     Vertical suspension Holding hand buoys at hips: bicylicing    Pt requires buoyancy for support and to offload joints with strengthening exercises. Viscosity of the water is needed for resistance of strengthening; water current perturbations provides challenge to standing balance unsupported, requiring increased core activation.       Plan -  08/05/21 1126     Clinical Impression Statement No knee pain just stiffness today. Pt reporting compliance with HEP.  Pt engages in core and LE stretching and  strengtheing today advancing reps as tolerated.  Balance challenges progressed to tandem stance on noodles    Stability/Clinical Decision Making Stable/Uncomplicated    Clinical Decision Making Low    Rehab Potential Good    PT Frequency 2x / week    PT Duration 6 weeks    PT Treatment/Interventions Aquatic Therapy;ADLs/Self Care Home Management;Cryotherapy;Electrical Stimulation;DME Instruction;Ultrasound;Moist Heat;Gait training;Stair training;Functional mobility training;Therapeutic activities;Therapeutic exercise;Balance training;Neuromuscular re-education;Manual techniques;Patient/family education;Passive range of motion;Dry needling;Taping    PT Home Exercise Plan Access Code: [email or web address] 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 [employer] - 1 x daily - 7 x weekly - 3 sets - 10 reps  Suspended Forward Bicycle Kick with Hand Floats - 1 x daily - 7 x weekly - 3 sets - 10 reps  Hamstring Stretch with Noodle - 1 x daily - 7 x weekly - 3 sets - 10 reps  Seated Straddle on Flotation Forward Breast Stroke Arms and Bicycle Legs - 1 x daily - 7 x weekly - 3 sets - 10 reps  Hip Flexor Stretch with Noodle - 1 x daily - 7 x weekly - 3 sets - 10 reps  Suspended Knee Tuck to Hip Flexion and Extension with Hand Floats - 1 x daily - 7 x weekly - 3 sets - 10 reps  Standing Balance on Noodle at [employer] - 1 x daily - 7 x weekly - 3 sets - 10 reps   URL: [email or web address]  Date: 07/21/2021  Prepared by: Ronny Flurry    Exercises  Supine Bridge - 1 x daily - 6 x weekly - 2 sets - 10 reps  Supine Active Straight Leg Raise - 1 x daily - 5-6 x weekly - 2 sets - 10 reps  Sidelying Hip Abduction - 1 x daily - 6 x weekly - 2 sets - 10 reps  Supine Heel Slide - 2 x daily - 7 x weekly - 1-2 sets - 10 reps  Hooklying Clamshell with Resistance - 1 x daily - 3-4 x weekly - 2 sets - 10 reps  Long Sitting Calf Stretch with Strap - 2 x daily - 7 x weekly - 1 sets - 2-3 reps - 20-30 seconds hold.              Patient will benefit from skilled therapeutic intervention in order to improve the following deficits and impairments:  Decreased activity tolerance, Decreased range of motion, Decreased strength, Hypomobility, Decreased mobility, Pain  Visit Diagnosis: Chronic pain of right knee  Acute pain of right knee  Muscle weakness (generalized)  Stiffness of right knee, not elsewhere classified     Problem List Patient Active Problem List   Diagnosis Date Noted   Thoracic back pain 05/19/2020   Prediabetes 01/22/2020   Weight gain 01/22/2020   Osteopenia 01/22/2020   Osteoarthritis 07/17/2019   Family history of heart disease 12/11/2018   Atypical chest pain 12/11/2018   B12 deficiency 10/16/2017   Elevated BP without diagnosis of hypertension 10/01/2017   Osteoporosis 10/01/2017   Uterine fibroid 07/31/2017   History of cleft palate 11/22/2016   Velopharyngeal insufficiency (VPI), 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   Normocytic anemia 07/31/2016   Screen for colon cancer 07/31/2016   Iron deficiency 04/27/2016   Hypothyroidism 12/17/2012   Insulin resistance 12/17/2012   Auras 10/15/2012   Hashimoto's thyroiditis  07/24/2012   Hyperlipidemia 07/24/2012   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   Basal metabolism function study abnormality 03/07/2012   Pituitary microadenoma ([place]) 01/05/2012   Dyspnea 07/09/2011   Pulmonary nodules 07/09/2011   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

## 2021-08-10 ENCOUNTER — Encounter (HOSPITAL_BASED_OUTPATIENT_CLINIC_OR_DEPARTMENT_OTHER): Payer: Self-pay | Admitting: Physical Therapy

## 2021-08-10 ENCOUNTER — Other Ambulatory Visit: Payer: Self-pay

## 2021-08-10 ENCOUNTER — Ambulatory Visit (HOSPITAL_BASED_OUTPATIENT_CLINIC_OR_DEPARTMENT_OTHER): Payer: BC Managed Care – PPO | Admitting: Physical Therapy

## 2021-08-10 DIAGNOSIS — R293 Abnormal posture: Secondary | ICD-10-CM | POA: Diagnosis not present

## 2021-08-10 DIAGNOSIS — R2689 Other abnormalities of gait and mobility: Secondary | ICD-10-CM

## 2021-08-10 DIAGNOSIS — G8929 Other chronic pain: Secondary | ICD-10-CM | POA: Diagnosis not present

## 2021-08-10 DIAGNOSIS — R262 Difficulty in walking, not elsewhere classified: Secondary | ICD-10-CM

## 2021-08-10 DIAGNOSIS — M6281 Muscle weakness (generalized): Secondary | ICD-10-CM

## 2021-08-10 DIAGNOSIS — R2681 Unsteadiness on feet: Secondary | ICD-10-CM | POA: Diagnosis not present

## 2021-08-10 DIAGNOSIS — M25661 Stiffness of right knee, not elsewhere classified: Secondary | ICD-10-CM | POA: Diagnosis not present

## 2021-08-10 DIAGNOSIS — M25561 Pain in right knee: Secondary | ICD-10-CM | POA: Diagnosis not present

## 2021-08-10 NOTE — Therapy (Signed)
[facility] [address] [place], [place], [address] Phone: [phone]   Fax:  [phone]  Physical Therapy Treatment  Patient Details  Name: Kim Kim MRN: [id number] Date of Birth: 1956-07-21 Referring Provider (PT): Nyoka Cowden, MD   Encounter Date: 08/10/2021   PT End of Session - 08/10/21 0812     Visit Number 8    Number of Visits 18    Date for PT Re-Evaluation 09/16/21    Authorization Type BCBS    PT Start Time 0815    PT Stop Time 0900    PT Time Calculation (min) 45 min    Equipment Utilized During Treatment Other (comment)    Activity Tolerance Patient tolerated treatment well    Behavior During Therapy [facility] for tasks assessed/performed             Past Medical History:  Diagnosis Date   Allergy    seasonal allergies   Anemia    hx of   Carotid atherosclerosis    Epilepsy ([place])    Gastric ulcer    hx of   GERD (gastroesophageal reflux disease)    on meds   Growth hormone deficiency ([place])    Hiatal hernia    Hiatal hernia    Hypothyroidism    hx of Hashimoto thyroiditis   Pituitary adenoma ([place])    hx of   Seizures ([place])    temporal lobe   Thyroid disease    on meds   Vitamin D deficiency     Past Surgical History:  Procedure Laterality Date   COLONOSCOPY  12/10/2015   [place] Dr Meredith Leeds   ESOPHAGOGASTRODUODENOSCOPY  06/03/2019   Dr Celso Sickle, [place] EXTRACTION      There were no vitals filed for this visit.   Subjective Assessment - 08/10/21 0818     Subjective "I did an aerobics water dance class and did alot of twisting, flared my knee.    Currently in Pain? Yes    Pain Score 5     Pain Location Knee    Pain Orientation Right    Pain Descriptors / Indicators Aching    Pain Type Chronic pain    Pain Onset More than a month ago    Pain Frequency Intermittent                                          PT Short  Term Goals - 08/10/21 0929       PT SHORT TERM GOAL #1   Status Achieved      PT SHORT TERM GOAL #4   Status Achieved               PT Long Term Goals - 08/01/21 0936       PT LONG TERM GOAL #1   Title Pt will perform a reciprocal gait on stairs without significant R knee pain.    Baseline muscle pulling, try to avoid stairs; hills are better than steps but still uncomfortable    Time 6    Period Weeks    Status On-going    Target Date 09/16/21      PT LONG TERM GOAL #2   Title Pt will report at least a 50% reduction in pain with activity.    Baseline not yet    Time 6    Period  Weeks    Status On-going    Target Date 09/16/21      PT LONG TERM GOAL #3   Title Pt will be independent with aquatic program and be able to perform aquatic exercises without adverse effects to establish an aquatic program for improved strength, pain, and tolerance to activity.    Baseline doing okay as of now but requires further progression    Time 6    Period Weeks    Status On-going    Target Date 09/16/21      PT LONG TERM GOAL #4   Title Pt will be able to perform walking program without adverse effects.    Baseline discomfort at 2 mi today    Time 6    Period Weeks    Status On-going    Target Date 09/16/21      PT LONG TERM GOAL #5   Title Pt will demo good understanding of appropriate workout activities and be able to perform workout activities without significant pain.    Baseline requires progression    Time 6    Period Weeks    Status On-going    Target Date 09/16/21      PT LONG TERM GOAL #6   Title Pt will demo 5/5 R hip flex and abd strength and at least 4+/5 hip extension strength for improved tolerance with and performance of functional mobility and workout activities.    Baseline see flowsheet    Time 6    Period Weeks    Status On-going           Pt seen for aquatic therapy today.  Treatment took place in water 3.25-4.8 ft in depth at the [facility] pool. Temp of water was 94.  Pt entered/exited the pool via stairs step to pattern independently with bilat rail.     Warm up: forward, backward and side stepping/walking cues for increased step length, increased speed, hand placement to increase resistance.     Standing Stretching -Hip flex  and quad stretch using noodle at wall 3 x 30 sec hold -Gastroc and hamstring "runners stretch" on  3 x 30 sec hold -LB stretch pike position at wall 3 x 30 seconds -Stretching same muscles using noodle for extended stretch. Strengthening: -Hip flex x 10 reps pulling noodle toward wall VC for core tightening, improved alignment and posture with activity   Balance and core strength challenges standing with wide BOS on noodle.  Gaining static balance with heels and then toes on ground x 30 sec. -pt challenged to gain balance on noodle. Added challenge to raise ue out of water and maintain balance.  Pt best x 10 best     Vertical suspension 2 foam hand buoys -knees to chest 2 x 10 -pike x 10 standing<>supine -supine suspension knees to chest 2 x 10 -standing<>prone   Pt requires buoyancy for support and to offload joints with strengthening exercises. Viscosity of the water is needed for resistance of strengthening; water current perturbations provides challenge to standing balance unsupported, requiring increased core activation.    Patient will benefit from skilled therapeutic intervention in order to improve the following deficits and impairments:  Decreased activity tolerance, Decreased range of motion, Decreased strength, Hypomobility, Decreased mobility, Pain  Visit Diagnosis: Abnormal posture  Chronic pain of right knee  Difficulty in walking, not elsewhere classified  Muscle weakness (generalized)  Unsteadiness on feet  Other abnormalities of gait and mobility     Problem List Patient  Active Problem List   Diagnosis Date Noted   Thoracic back pain 05/19/2020    Prediabetes 01/22/2020   Weight gain 01/22/2020   Osteopenia 01/22/2020   Osteoarthritis 07/17/2019   Family history of heart disease 12/11/2018   Atypical chest pain 12/11/2018   B12 deficiency 10/16/2017   Elevated BP without diagnosis of hypertension 10/01/2017   Osteoporosis 10/01/2017   Uterine fibroid 07/31/2017   History of cleft palate 11/22/2016   Velopharyngeal insufficiency (VPI), 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   Normocytic anemia 07/31/2016   Screen for colon cancer 07/31/2016   Iron deficiency 04/27/2016   Hypothyroidism 12/17/2012   Insulin resistance 12/17/2012   Auras 10/15/2012   Hashimoto's thyroiditis 07/24/2012   Hyperlipidemia 07/24/2012   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   Basal metabolism function study abnormality 03/07/2012   Pituitary microadenoma ([place]) 01/05/2012   Dyspnea 07/09/2011   Pulmonary nodules 07/09/2011   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

## 2021-08-12 ENCOUNTER — Other Ambulatory Visit: Payer: Self-pay

## 2021-08-12 ENCOUNTER — Ambulatory Visit (HOSPITAL_BASED_OUTPATIENT_CLINIC_OR_DEPARTMENT_OTHER): Payer: BC Managed Care – PPO | Admitting: Physical Therapy

## 2021-08-12 DIAGNOSIS — M6281 Muscle weakness (generalized): Secondary | ICD-10-CM | POA: Diagnosis not present

## 2021-08-12 DIAGNOSIS — R262 Difficulty in walking, not elsewhere classified: Secondary | ICD-10-CM | POA: Diagnosis not present

## 2021-08-12 DIAGNOSIS — R293 Abnormal posture: Secondary | ICD-10-CM

## 2021-08-12 DIAGNOSIS — M25661 Stiffness of right knee, not elsewhere classified: Secondary | ICD-10-CM | POA: Diagnosis not present

## 2021-08-12 DIAGNOSIS — G8929 Other chronic pain: Secondary | ICD-10-CM | POA: Diagnosis not present

## 2021-08-12 DIAGNOSIS — R2689 Other abnormalities of gait and mobility: Secondary | ICD-10-CM

## 2021-08-12 DIAGNOSIS — M25561 Pain in right knee: Secondary | ICD-10-CM | POA: Diagnosis not present

## 2021-08-12 DIAGNOSIS — R2681 Unsteadiness on feet: Secondary | ICD-10-CM

## 2021-08-12 NOTE — Therapy (Signed)
[facility] [address] [place], [place], [address] Phone: [phone]   Fax:  [phone]  Physical Therapy Treatment  Patient Details  Name: Kim Kim MRN: [id number] Date of Birth: [birth year] Referring Provider (PT): Nyoka Cowden, MD   Encounter Date: 08/12/2021   PT End of Session - 08/12/21 0820     Visit Number 9    Number of Visits 18    Date for PT Re-Evaluation 09/16/21    Authorization Type BCBS    PT Start Time 0815    PT Stop Time 0905    PT Time Calculation (min) 50 min    Equipment Utilized During Treatment Other (comment)    Activity Tolerance Patient tolerated treatment well    Behavior During Therapy [facility] for tasks assessed/performed             Past Medical History:  Diagnosis Date   Allergy    seasonal allergies   Anemia    hx of   Carotid atherosclerosis    Epilepsy ([place])    Gastric ulcer    hx of   GERD (gastroesophageal reflux disease)    on meds   Growth hormone deficiency ([place])    Hiatal hernia    Hiatal hernia    Hypothyroidism    hx of Hashimoto thyroiditis   Pituitary adenoma ([place])    hx of   Seizures ([place])    temporal lobe   Thyroid disease    on meds   Vitamin D deficiency     Past Surgical History:  Procedure Laterality Date   COLONOSCOPY  12/10/2015   [place] Dr Meredith Leeds   ESOPHAGOGASTRODUODENOSCOPY  06/03/2019   Dr Celso Sickle, [place] EXTRACTION      There were no vitals filed for this visit.   Subjective Assessment - 08/12/21 0816     Subjective "Pain is better, down a notch"                                          PT Short Term Goals - 08/10/21 0929       PT SHORT TERM GOAL #1   Status Achieved      PT SHORT TERM GOAL #4   Status Achieved               PT Long Term Goals - 08/01/21 0936       PT LONG TERM GOAL #1   Title Pt will perform a reciprocal gait on stairs without  significant R knee pain.    Baseline muscle pulling, try to avoid stairs; hills are better than steps but still uncomfortable    Time 6    Period Weeks    Status On-going    Target Date 09/16/21      PT LONG TERM GOAL #2   Title Pt will report at least a 50% reduction in pain with activity.    Baseline not yet    Time 6    Period Weeks    Status On-going    Target Date 09/16/21      PT LONG TERM GOAL #3   Title Pt will be independent with aquatic program and be able to perform aquatic exercises without adverse effects to establish an aquatic program for improved strength, pain, and tolerance to activity.    Baseline doing okay as of now but requires  further progression    Time 6    Period Weeks    Status On-going    Target Date 09/16/21      PT LONG TERM GOAL #4   Title Pt will be able to perform walking program without adverse effects.    Baseline discomfort at 2 mi today    Time 6    Period Weeks    Status On-going    Target Date 09/16/21      PT LONG TERM GOAL #5   Title Pt will demo good understanding of appropriate workout activities and be able to perform workout activities without significant pain.    Baseline requires progression    Time 6    Period Weeks    Status On-going    Target Date 09/16/21      PT LONG TERM GOAL #6   Title Pt will demo 5/5 R hip flex and abd strength and at least 4+/5 hip extension strength for improved tolerance with and performance of functional mobility and workout activities.    Baseline see flowsheet    Time 6    Period Weeks    Status On-going            Pt seen for aquatic therapy today.  Treatment took place in water 3.25-4.8 ft in depth at the [facility] pool. Temp of water was 94.  Pt entered/exited the pool via stairs step to pattern independently with bilat rail.     Warm up: forward, backward and side stepping/walking cues for increased step length, increased speed, hand placement to increase resistance.      Standing Stretching -Hip flex  and quad stretch using noodle at wall 3 x 30 sec hold -Gastroc and hamstring "runners stretch" on  3 x 30 sec hold -LB stretch pike position at wall 3 x 30 seconds     Vertical suspension 2 foam hand buoys -knees to chest 2 x 10 -pike x 10 standing<>supine -supine suspension knees to chest 2 x 10 -standing<>prone x 5. Instructed on hand buoy placement and attempts at upright flys. Abdominal and LB stretch Added: forward to back pendulum               Lateral pendulum                     - multiple trials of each to gain position and proper execution. Will continue with next session.    Pt requires buoyancy for support and to offload joints with strengthening exercises. Viscosity of the water is needed for resistance of strengthening; water current perturbations provides challenge to standing balance unsupported, requiring increased core activation.       Plan - 08/12/21 1323     Clinical Impression Statement Continued to advance exercise program  Added forward<>back and lateral pendulum exercises.  Pt reporting tightened lat core R>L as identified while completing lateral pendulums.  Added to HEP.  Pt will look on medbridge to watch videos on proper tech.  Overall pain decreased and increase in core strength as evidenced by moving to next level on exercises.    Stability/Clinical Decision Making Stable/Uncomplicated    Clinical Decision Making Low    Rehab Potential Good    PT Frequency 2x / week    PT Duration 6 weeks    PT Treatment/Interventions Aquatic Therapy;ADLs/Self Care Home Management;Cryotherapy;Electrical Stimulation;DME Instruction;Ultrasound;Moist Heat;Gait training;Stair training;Functional mobility training;Therapeutic activities;Therapeutic exercise;Balance training;Neuromuscular re-education;Manual techniques;Patient/family education;Passive range of motion;Dry needling;Taping    PT Home  Exercise Plan Access Code: [email or web address] aquatics  added.             Patient will benefit from skilled therapeutic intervention in order to improve the following deficits and impairments:  Decreased activity tolerance, Decreased range of motion, Decreased strength, Hypomobility, Decreased mobility, Pain  Visit Diagnosis: Abnormal posture  Chronic pain of right knee  Difficulty in walking, not elsewhere classified  Muscle weakness (generalized)  Other abnormalities of gait and mobility  Unsteadiness on feet     Problem List Patient Active Problem List   Diagnosis Date Noted   Thoracic back pain 05/19/2020   Prediabetes 01/22/2020   Weight gain 01/22/2020   Osteopenia 01/22/2020   Osteoarthritis 07/17/2019   Family history of heart disease 12/11/2018   Atypical chest pain 12/11/2018   B12 deficiency 10/16/2017   Elevated BP without diagnosis of hypertension 10/01/2017   Osteoporosis 10/01/2017   Uterine fibroid 07/31/2017   History of cleft palate 11/22/2016   Velopharyngeal insufficiency (VPI), 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   Normocytic anemia 07/31/2016   Screen for colon cancer 07/31/2016   Iron deficiency 04/27/2016   Hypothyroidism 12/17/2012   Insulin resistance 12/17/2012   Auras 10/15/2012   Hashimoto's thyroiditis 07/24/2012   Hyperlipidemia 07/24/2012   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 [place], [place], [address] Phone: [phone]   Fax:   [phone]  Name: Kim Kim MRN: [id number] Date of Birth: 07-09-1956

## 2021-08-16 ENCOUNTER — Ambulatory Visit (INDEPENDENT_AMBULATORY_CARE_PROVIDER_SITE_OTHER): Payer: BC Managed Care – PPO | Admitting: Adult Health

## 2021-08-16 ENCOUNTER — Other Ambulatory Visit: Payer: Self-pay

## 2021-08-16 ENCOUNTER — Encounter (INDEPENDENT_AMBULATORY_CARE_PROVIDER_SITE_OTHER): Payer: Self-pay | Admitting: Adult Health

## 2021-08-16 ENCOUNTER — Ambulatory Visit: Payer: BC Managed Care – PPO | Admitting: Sports Medicine

## 2021-08-16 VITALS — BP 118/76 | HR 85 | Temp 98.3°F | Ht 63.0 in | Wt 166.0 lb

## 2021-08-16 DIAGNOSIS — Z6830 Body mass index (BMI) 30.0-30.9, adult: Secondary | ICD-10-CM

## 2021-08-16 DIAGNOSIS — E669 Obesity, unspecified: Secondary | ICD-10-CM | POA: Diagnosis not present

## 2021-08-16 DIAGNOSIS — E7849 Other hyperlipidemia: Secondary | ICD-10-CM | POA: Diagnosis not present

## 2021-08-16 NOTE — Progress Notes (Signed)
Chief Complaint:   OBESITY Kim Kim is here to discuss her progress with her obesity treatment plan along with follow-up of her obesity related diagnoses. Kim Kim is on keeping a food journal and adhering to recommended goals of 1100 calories and 75+ grams protein and states she is following her eating plan approximately 0% of the time. Kim Kim states she is not currently exercising.  Today's visit was #: 3 Starting weight: 166 lbs Starting date: 07/12/2021 Today's weight: 166 lbs Today's date: 08/16/2021 Total lbs lost to date: 0 Total lbs lost since last in-office visit: 0  Interim History: Kim Kim recently joined [employer] and attempts to go to the facility daily when she's home.  She lives in [place] and will travel often- [place], [place], [place]. 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  Subjective:   1. Other hyperlipidemia Kim Kim is on Rosuvastatin 10 mg QHS and denies myalgias. 9/15/222 lipid panel- total 157, triglycerides 90, HDL 52, and LDL 87.  Assessment/Plan:   1. Other hyperlipidemia Cardiovascular risk and specific lipid/LDL goals reviewed.  We discussed several lifestyle modifications today and Kim Kim will continue to work on diet, exercise and weight loss efforts. Orders and follow up as documented in patient record. Continue daily rosuvastatin and remain well hydrated.  Counseling Intensive lifestyle modifications are the first line treatment for this issue. Dietary changes: Increase soluble fiber. Decrease simple carbohydrates. Exercise changes: Moderate to vigorous-intensity aerobic activity 150 minutes per week if tolerated. Lipid-lowering medications: see documented in medical record.  2. Obesity with current BMI of 29.4  Kim Kim is currently in the action stage of change. As such, her goal is to continue with weight loss efforts. She has agreed to keeping a food journal and adhering to  recommended goals of 1100 calories and 75 grams protein.   Handout: High Protein/Low Cal Food; Recipe Guide II; Protein Content of Food  Exercise goals:  Daily walking; Water aerobics; [employer] modification strategies: increasing lean protein intake, decreasing simple carbohydrates, meal planning and cooking strategies, keeping healthy foods in the home, planning for success, and keeping a strict food journal.  Kim Kim has agreed to follow-up with our clinic in 2 weeks. She was informed of the importance of frequent follow-up visits to maximize her success with intensive lifestyle modifications for her multiple health conditions.   Objective:   Blood pressure 118/76, pulse 85, temperature 98.3 F (36.8 C), height 5\' 3"  (1.6 m), weight 166 lb (75.3 kg), SpO2 97 %. Body mass index is 29.41 kg/m.  General: Cooperative, alert, well developed, in no acute distress. HEENT: Conjunctivae and lids unremarkable. Cardiovascular: Regular rhythm.  Lungs: Normal work of breathing. Neurologic: No focal deficits.   Lab Results  Component Value Date   CREATININE 0.99 07/12/2021   BUN 16 07/12/2021   NA 140 07/12/2021   K 4.6 07/12/2021   CL 98 07/12/2021   CO2 26 07/12/2021   Lab Results  Component Value Date   ALT 16 07/12/2021   AST 26 07/12/2021   ALKPHOS 119 07/12/2021   BILITOT 0.4 07/12/2021   Lab Results  Component Value Date   HGBA1C 6.1 09/06/2020   HGBA1C 5.8 01/07/2020   Lab Results  Component Value Date   INSULIN 9.5 07/12/2021   Lab Results  Component Value Date   TSH 2.27 09/06/2020   No results found for: CHOL, HDL, LDLCALC, LDLDIRECT, TRIG, CHOLHDL Lab Results  Component Value  Date   VD25OH 36.99 09/06/2020   VD25OH 34.95 01/07/2020   Lab Results  Component Value Date   WBC 7.1 09/29/2020   HGB 12.0 09/29/2020   HCT 36.1 09/29/2020   MCV 80.8 09/29/2020   PLT 242.0 09/29/2020   Lab Results  Component Value Date   IRON 44 (L) 09/29/2020    TIBC 360 09/29/2020   FERRITIN 22.7 09/06/2020   Attestation Statements:   Reviewed by clinician on day of visit: allergies, medications, problem list, medical history, surgical history, family history, social history, and previous encounter notes.  Time spent on visit including pre-visit chart review and post-visit care and charting was 30 minutes.   Coral Ceo, CMA, am acting as transcriptionist for [name], NP.  I have reviewed the above documentation for accuracy and completeness, and I agree with the above. -  Jailene Cupit d. Lessie Funderburke, NP-C

## 2021-08-17 ENCOUNTER — Ambulatory Visit (HOSPITAL_BASED_OUTPATIENT_CLINIC_OR_DEPARTMENT_OTHER): Payer: BC Managed Care – PPO | Admitting: Physical Therapy

## 2021-08-17 ENCOUNTER — Encounter (HOSPITAL_BASED_OUTPATIENT_CLINIC_OR_DEPARTMENT_OTHER): Payer: Self-pay | Admitting: Physical Therapy

## 2021-08-17 DIAGNOSIS — R262 Difficulty in walking, not elsewhere classified: Secondary | ICD-10-CM | POA: Diagnosis not present

## 2021-08-17 DIAGNOSIS — M6281 Muscle weakness (generalized): Secondary | ICD-10-CM | POA: Diagnosis not present

## 2021-08-17 DIAGNOSIS — G8929 Other chronic pain: Secondary | ICD-10-CM

## 2021-08-17 DIAGNOSIS — R2681 Unsteadiness on feet: Secondary | ICD-10-CM | POA: Diagnosis not present

## 2021-08-17 DIAGNOSIS — R293 Abnormal posture: Secondary | ICD-10-CM

## 2021-08-17 DIAGNOSIS — M25561 Pain in right knee: Secondary | ICD-10-CM

## 2021-08-17 DIAGNOSIS — M25661 Stiffness of right knee, not elsewhere classified: Secondary | ICD-10-CM | POA: Diagnosis not present

## 2021-08-17 DIAGNOSIS — R2689 Other abnormalities of gait and mobility: Secondary | ICD-10-CM | POA: Diagnosis not present

## 2021-08-17 NOTE — Therapy (Signed)
[facility] [address] [place], [place], [address] Phone: [phone]   Fax:  [phone]  Physical Therapy Treatment  Patient Details  Name: Kim Kim MRN: [id number] Date of Birth: [birth year] Referring Provider (PT): Nyoka Cowden, MD   Encounter Date: 08/17/2021   PT End of Session - 08/17/21 0849     Visit Number 10    Number of Visits 18    Date for PT Re-Evaluation 09/16/21    Authorization Type BCBS    PT Start Time 0845    PT Stop Time 0929    PT Time Calculation (min) 44 min    Activity Tolerance Patient tolerated treatment well    Behavior During Therapy [facility] for tasks assessed/performed             Past Medical History:  Diagnosis Date   Allergy    seasonal allergies   Anemia    hx of   Carotid atherosclerosis    Epilepsy ([place])    Gastric ulcer    hx of   GERD (gastroesophageal reflux disease)    on meds   Growth hormone deficiency ([place])    Hiatal hernia    Hiatal hernia    Hypothyroidism    hx of Hashimoto thyroiditis   Pituitary adenoma ([place])    hx of   Seizures ([place])    temporal lobe   Thyroid disease    on meds   Vitamin D deficiency     Past Surgical History:  Procedure Laterality Date   COLONOSCOPY  12/10/2015   [place] Dr Meredith Leeds   ESOPHAGOGASTRODUODENOSCOPY  06/03/2019   Dr Celso Sickle, [place] EXTRACTION      There were no vitals filed for this visit.   Subjective Assessment - 08/17/21 0849     Subjective My knee is achey rather than pain. [place] [facility] [place] and I could feel it. I did aquatic aerobics yesterday. not doing so many walks for time constraints.    Patient Stated Goals understand what she can and can't do especially in the water.  pt wants to make sure that she is not doing anything that increases her pain or sx's.    Currently in Pain? Yes    Pain Score 6     Pain Location Knee    Pain Orientation Right     Pain Descriptors / Indicators Aching    Aggravating Factors  walking    Pain Relieving Factors rest                [facility] PT Assessment - 08/17/21 0001       AROM   Right Knee Extension 0      Strength   Right Hip Extension 4/5    Right Hip ABduction 4-/5    Left Hip Extension 4/5    Left Hip ABduction 4/5                                    PT Education - 08/17/21 2054     Education Details goals and progress, POC, anatomy of condition, aquatic exercises & avoiding jumping    Person(s) Educated Patient    Methods Explanation    Comprehension Verbalized understanding;Need further instruction              PT Short Term Goals - 08/17/21 4650  PT SHORT TERM GOAL #1   Title pt will be independent with land based HEP for improved ROM, pain, strength, and function.    Status Achieved      PT SHORT TERM GOAL #2   Title Pt will demo improved R knee extension AROM to 0 deg for improved stiffness and mobility.    Baseline R knee extension 0 deg, reports less stiffness    Status Achieved      PT SHORT TERM GOAL #3   Title Pt will demo improved hip extension strength to 4/5 MMT bilat for improved mobility and tolerance with activities.    Status Achieved      PT SHORT TERM GOAL #4   Title Pt will tolerate aquatic exercises well without adverse effects for improved tolerance to activity.    Status Achieved               PT Long Term Goals - 08/01/21 0936       PT LONG TERM GOAL #1   Title Pt will perform a reciprocal gait on stairs without significant R knee pain.    Baseline muscle pulling, try to avoid stairs; hills are better than steps but still uncomfortable    Time 6    Period Weeks    Status On-going    Target Date 09/16/21      PT LONG TERM GOAL #2   Title Pt will report at least a 50% reduction in pain with activity.    Baseline not yet    Time 6    Period Weeks    Status On-going    Target Date 09/16/21       PT LONG TERM GOAL #3   Title Pt will be independent with aquatic program and be able to perform aquatic exercises without adverse effects to establish an aquatic program for improved strength, pain, and tolerance to activity.    Baseline doing okay as of now but requires further progression    Time 6    Period Weeks    Status On-going    Target Date 09/16/21      PT LONG TERM GOAL #4   Title Pt will be able to perform walking program without adverse effects.    Baseline discomfort at 2 mi today    Time 6    Period Weeks    Status On-going    Target Date 09/16/21      PT LONG TERM GOAL #5   Title Pt will demo good understanding of appropriate workout activities and be able to perform workout activities without significant pain.    Baseline requires progression    Time 6    Period Weeks    Status On-going    Target Date 09/16/21      PT LONG TERM GOAL #6   Title Pt will demo 5/5 R hip flex and abd strength and at least 4+/5 hip extension strength for improved tolerance with and performance of functional mobility and workout activities.    Baseline see flowsheet    Time 6    Period Weeks    Status On-going                   Plan - 08/17/21 2054     Clinical Impression Statement Pt has made progress since beginning PT but continues to lack necessary proximal strength to provide distal stability to knee joint. Added [place] hip strength exercises to be working on at home. Trialed cables in gym  for hip strength but was unable to move the 5lb plate- advised to perform without weight until strength builds. will cont to use water as a medium for exercise to reduce joint loading.    PT Treatment/Interventions Aquatic Therapy;ADLs/Self Care Home Management;Cryotherapy;Electrical Stimulation;DME Instruction;Ultrasound;Moist Heat;Gait training;Stair training;Functional mobility training;Therapeutic activities;Therapeutic exercise;Balance training;Neuromuscular re-education;Manual  techniques;Patient/family education;Passive range of motion;Dry needling;Taping    PT Next Visit Plan Aquatic- core/pelvic floor strength and coordinated activation, hip abd group strengthening, stretching program for lower body    PT Home Exercise Plan Access Code: [email or web address] aquatics added.    Consulted and Agree with Plan of Care Patient             Patient will benefit from skilled therapeutic intervention in order to improve the following deficits and impairments:  Decreased activity tolerance, Decreased range of motion, Decreased strength, Hypomobility, Decreased mobility, Pain  Visit Diagnosis: Abnormal posture  Chronic pain of right knee  Difficulty in walking, not elsewhere classified  Muscle weakness (generalized)     Problem List Patient Active Problem List   Diagnosis Date Noted   Thoracic back pain 05/19/2020   Prediabetes 01/22/2020   Weight gain 01/22/2020   Osteopenia 01/22/2020   Osteoarthritis 07/17/2019   Family history of heart disease 12/11/2018   Atypical chest pain 12/11/2018   B12 deficiency 10/16/2017   Elevated BP without diagnosis of hypertension 10/01/2017   Osteoporosis 10/01/2017   Uterine fibroid 07/31/2017   History of cleft palate 11/22/2016   Velopharyngeal insufficiency (VPI), 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   Normocytic anemia 07/31/2016   Screen for colon cancer 07/31/2016   Iron deficiency 04/27/2016   Hypothyroidism 12/17/2012   Insulin resistance 12/17/2012   Auras 10/15/2012   Hashimoto's thyroiditis 07/24/2012   Hyperlipidemia 07/24/2012   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 Analissa Bayless PT, DPT 08/17/21 9:00 PM   [facility] [place], [place], [address] Phone: [phone]   Fax:  [phone]  Name: Kim Kim MRN: [id number] Date of Birth: 12-29-1955

## 2021-08-19 ENCOUNTER — Ambulatory Visit (HOSPITAL_BASED_OUTPATIENT_CLINIC_OR_DEPARTMENT_OTHER): Payer: BC Managed Care – PPO | Admitting: Physical Therapy

## 2021-08-24 ENCOUNTER — Encounter (HOSPITAL_BASED_OUTPATIENT_CLINIC_OR_DEPARTMENT_OTHER): Payer: Self-pay | Admitting: Physical Therapy

## 2021-08-24 ENCOUNTER — Other Ambulatory Visit: Payer: Self-pay

## 2021-08-24 ENCOUNTER — Other Ambulatory Visit (HOSPITAL_BASED_OUTPATIENT_CLINIC_OR_DEPARTMENT_OTHER): Payer: Self-pay

## 2021-08-24 ENCOUNTER — Ambulatory Visit (HOSPITAL_BASED_OUTPATIENT_CLINIC_OR_DEPARTMENT_OTHER): Payer: BC Managed Care – PPO | Admitting: Physical Therapy

## 2021-08-24 DIAGNOSIS — R293 Abnormal posture: Secondary | ICD-10-CM | POA: Diagnosis not present

## 2021-08-24 DIAGNOSIS — M25561 Pain in right knee: Secondary | ICD-10-CM | POA: Diagnosis not present

## 2021-08-24 DIAGNOSIS — R2689 Other abnormalities of gait and mobility: Secondary | ICD-10-CM

## 2021-08-24 DIAGNOSIS — G8929 Other chronic pain: Secondary | ICD-10-CM

## 2021-08-24 DIAGNOSIS — M25661 Stiffness of right knee, not elsewhere classified: Secondary | ICD-10-CM | POA: Diagnosis not present

## 2021-08-24 DIAGNOSIS — M6281 Muscle weakness (generalized): Secondary | ICD-10-CM | POA: Diagnosis not present

## 2021-08-24 DIAGNOSIS — R2681 Unsteadiness on feet: Secondary | ICD-10-CM | POA: Diagnosis not present

## 2021-08-24 DIAGNOSIS — R262 Difficulty in walking, not elsewhere classified: Secondary | ICD-10-CM

## 2021-08-24 MED ORDER — INFLUENZA VAC SPLIT QUAD 0.5 ML IM SUSY
PREFILLED_SYRINGE | INTRAMUSCULAR | 0 refills | Status: DC
Start: 2021-08-24 — End: 2022-08-03
  Filled 2021-08-24: qty 0.5, 1d supply, fill #0

## 2021-08-24 NOTE — Therapy (Signed)
[facility] [address] [place], [place], [address] Phone: [phone]   Fax:  [phone]  Physical Therapy Treatment  Patient Details  Name: Kim Kim MRN: [id number] Date of Birth: [birth year] Referring Provider (PT): Nyoka Cowden, MD   Encounter Date: 08/24/2021   PT End of Session - 08/24/21 0822     Visit Number 11    Number of Visits 18    Date for PT Re-Evaluation 09/16/21    Authorization Type BCBS    PT Start Time 0815    PT Stop Time 0903    PT Time Calculation (min) 48 min    Equipment Utilized During Treatment Other (comment)    Activity Tolerance Patient tolerated treatment well    Behavior During Therapy [facility] for tasks assessed/performed             Past Medical History:  Diagnosis Date   Allergy    seasonal allergies   Anemia    hx of   Carotid atherosclerosis    Epilepsy ([place])    Gastric ulcer    hx of   GERD (gastroesophageal reflux disease)    on meds   Growth hormone deficiency ([place])    Hiatal hernia    Hiatal hernia    Hypothyroidism    hx of Hashimoto thyroiditis   Pituitary adenoma ([place])    hx of   Seizures ([place])    temporal lobe   Thyroid disease    on meds   Vitamin D deficiency     Past Surgical History:  Procedure Laterality Date   COLONOSCOPY  12/10/2015   [place] Dr Meredith Leeds   ESOPHAGOGASTRODUODENOSCOPY  06/03/2019   Dr Celso Sickle, [place] EXTRACTION      There were no vitals filed for this visit.   Subjective Assessment - 08/24/21 1400     Subjective I had some trouble with the exercises Janett Billow had me try yesterday    Currently in Pain? Yes    Pain Score 4     Pain Orientation Right    Pain Descriptors / Indicators Aching    Pain Type Chronic pain    Pain Onset More than a month ago    Pain Frequency Intermittent                                          PT Short Term Goals - 08/17/21 0854        PT SHORT TERM GOAL #1   Title pt will be independent with land based HEP for improved ROM, pain, strength, and function.    Status Achieved      PT SHORT TERM GOAL #2   Title Pt will demo improved R knee extension AROM to 0 deg for improved stiffness and mobility.    Baseline R knee extension 0 deg, reports less stiffness    Status Achieved      PT SHORT TERM GOAL #3   Title Pt will demo improved hip extension strength to 4/5 MMT bilat for improved mobility and tolerance with activities.    Status Achieved      PT SHORT TERM GOAL #4   Title Pt will tolerate aquatic exercises well without adverse effects for improved tolerance to activity.    Status Achieved               PT Long Term  Goals - 08/01/21 0936       PT LONG TERM GOAL #1   Title Pt will perform a reciprocal gait on stairs without significant R knee pain.    Baseline muscle pulling, try to avoid stairs; hills are better than steps but still uncomfortable    Time 6    Period Weeks    Status On-going    Target Date 09/16/21      PT LONG TERM GOAL #2   Title Pt will report at least a 50% reduction in pain with activity.    Baseline not yet    Time 6    Period Weeks    Status On-going    Target Date 09/16/21      PT LONG TERM GOAL #3   Title Pt will be independent with aquatic program and be able to perform aquatic exercises without adverse effects to establish an aquatic program for improved strength, pain, and tolerance to activity.    Baseline doing okay as of now but requires further progression    Time 6    Period Weeks    Status On-going    Target Date 09/16/21      PT LONG TERM GOAL #4   Title Pt will be able to perform walking program without adverse effects.    Baseline discomfort at 2 mi today    Time 6    Period Weeks    Status On-going    Target Date 09/16/21      PT LONG TERM GOAL #5   Title Pt will demo good understanding of appropriate workout activities and be able to perform workout  activities without significant pain.    Baseline requires progression    Time 6    Period Weeks    Status On-going    Target Date 09/16/21      PT LONG TERM GOAL #6   Title Pt will demo 5/5 R hip flex and abd strength and at least 4+/5 hip extension strength for improved tolerance with and performance of functional mobility and workout activities.    Baseline see flowsheet    Time 6    Period Weeks    Status On-going            Pt seen for aquatic therapy today.  Treatment took place in water 3.25-4.8 ft in depth at the [facility] pool. Temp of water was 94.  Pt entered/exited the pool via stairs step to pattern independently with bilat rail.     Warm up: forward, backward and side stepping/walking cues for increased step length, increased speed, hand placement to increase resistance.     Standing Stretching -Hip flex  and quad stretch using noodle at wall 3 x 30 sec hold -Gastroc and hamstring "runners stretch" on  3 x 30 sec hold -LB stretch pike position at wall 3 x 30 seconds  Modified quadriped fire hydrant on steps: 2 x 10 with 3 lb weight -hip circles 2 x 10 using water band secured just below knee   - hip flex 2x10    -hip Abd 2x10    -hip Add 2x10    -hip extension 2 x 10   Water walking cool down backward x 6 widths.  Pt requires buoyancy for support and to offload joints with strengthening exercises. Viscosity of the water is needed for resistance of strengthening; water current perturbations provides challenge to standing balance unsupported, requiring increased core activation.       Plan - 08/24/21  1610     Clinical Impression Statement Focus on proximal strength LLE. Able to use noodle then 3 LB weight for modified quadriped for OKC exercises.  Followed with band for resisted hip add, abd, flex and extension completed in standing. Pt encouraged to complete exercises as instreucted by land therapist.    PT Treatment/Interventions Aquatic  Therapy;ADLs/Self Care Home Management;Cryotherapy;Electrical Stimulation;DME Instruction;Ultrasound;Moist Heat;Gait training;Stair training;Functional mobility training;Therapeutic activities;Therapeutic exercise;Balance training;Neuromuscular re-education;Manual techniques;Patient/family education;Passive range of motion;Dry needling;Taping    PT Home Exercise Plan Access Code: [email or web address] aquatics added.             Patient will benefit from skilled therapeutic intervention in order to improve the following deficits and impairments:  Decreased activity tolerance, Decreased range of motion, Decreased strength, Hypomobility, Decreased mobility, Pain  Visit Diagnosis: Abnormal posture  Other abnormalities of gait and mobility  Unsteadiness on feet  Chronic pain of right knee  Difficulty in walking, not elsewhere classified  Muscle weakness (generalized)     Problem List Patient Active Problem List   Diagnosis Date Noted   Thoracic back pain 05/19/2020   Prediabetes 01/22/2020   Weight gain 01/22/2020   Osteopenia 01/22/2020   Osteoarthritis 07/17/2019   Family history of heart disease 12/11/2018   Atypical chest pain 12/11/2018   B12 deficiency 10/16/2017   Elevated BP without diagnosis of hypertension 10/01/2017   Osteoporosis 10/01/2017   Uterine fibroid 07/31/2017   History of cleft palate 11/22/2016   Velopharyngeal insufficiency (VPI), 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   Normocytic anemia 07/31/2016   Screen for colon cancer 07/31/2016   Iron deficiency 04/27/2016   Hypothyroidism 12/17/2012   Insulin resistance 12/17/2012   Auras 10/15/2012   Hashimoto's thyroiditis 07/24/2012   Hyperlipidemia 07/24/2012   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   Basal metabolism function study abnormality 03/07/2012   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

## 2021-08-26 ENCOUNTER — Other Ambulatory Visit: Payer: Self-pay

## 2021-08-26 ENCOUNTER — Ambulatory Visit (HOSPITAL_BASED_OUTPATIENT_CLINIC_OR_DEPARTMENT_OTHER): Payer: BC Managed Care – PPO | Admitting: Physical Therapy

## 2021-08-26 ENCOUNTER — Encounter (HOSPITAL_BASED_OUTPATIENT_CLINIC_OR_DEPARTMENT_OTHER): Payer: Self-pay | Admitting: Physical Therapy

## 2021-08-26 DIAGNOSIS — M25561 Pain in right knee: Secondary | ICD-10-CM | POA: Diagnosis not present

## 2021-08-26 DIAGNOSIS — M25661 Stiffness of right knee, not elsewhere classified: Secondary | ICD-10-CM | POA: Diagnosis not present

## 2021-08-26 DIAGNOSIS — M6281 Muscle weakness (generalized): Secondary | ICD-10-CM | POA: Diagnosis not present

## 2021-08-26 DIAGNOSIS — R293 Abnormal posture: Secondary | ICD-10-CM

## 2021-08-26 DIAGNOSIS — R2681 Unsteadiness on feet: Secondary | ICD-10-CM | POA: Diagnosis not present

## 2021-08-26 DIAGNOSIS — G8929 Other chronic pain: Secondary | ICD-10-CM | POA: Diagnosis not present

## 2021-08-26 DIAGNOSIS — R262 Difficulty in walking, not elsewhere classified: Secondary | ICD-10-CM | POA: Diagnosis not present

## 2021-08-26 DIAGNOSIS — R2689 Other abnormalities of gait and mobility: Secondary | ICD-10-CM

## 2021-08-26 NOTE — Therapy (Signed)
[facility] [place], [place], [address] Phone: [phone]   Fax:  [phone]  Physical Therapy Treatment  Patient Details  Name: Kim Kim MRN: [id number] Date of Birth: 1956-05-30 Referring Provider (PT): Nyoka Cowden, MD   Encounter Date: 08/26/2021  Subjective "all the new exercises are good. I wasn't soar like I thought I was going to be"   Past Medical History:  Diagnosis Date   Allergy    seasonal allergies   Anemia    hx of   Carotid atherosclerosis    Epilepsy ([place])    Gastric ulcer    hx of   GERD (gastroesophageal reflux disease)    on meds   Growth hormone deficiency ([place])    Hiatal hernia    Hiatal hernia    Hypothyroidism    hx of Hashimoto thyroiditis   Pituitary adenoma ([place])    hx of   Seizures ([place])    temporal lobe   Thyroid disease    on meds   Vitamin D deficiency     Past Surgical History:  Procedure Laterality Date   COLONOSCOPY  12/10/2015   [place] Meredith Leeds   ESOPHAGOGASTRODUODENOSCOPY  06/03/2019   Dr Celso Sickle, [place] EXTRACTION      There were no vitals filed for this visit.                                  PT Short Term Goals - 08/17/21 0854       PT SHORT TERM GOAL #1   Title pt will be independent with land based HEP for improved ROM, pain, strength, and function.    Status Achieved      PT SHORT TERM GOAL #2   Title Pt will demo improved R knee extension AROM to 0 deg for improved stiffness and mobility.    Baseline R knee extension 0 deg, reports less stiffness    Status Achieved      PT SHORT TERM GOAL #3   Title Pt will demo improved hip extension strength to 4/5 MMT bilat for improved mobility and tolerance with activities.    Status Achieved      PT SHORT TERM GOAL #4   Title Pt will tolerate aquatic exercises well without adverse effects for improved tolerance to activity.    Status  Achieved               PT Long Term Goals - 08/01/21 0936       PT LONG TERM GOAL #1   Title Pt will perform a reciprocal gait on stairs without significant R knee pain.    Baseline muscle pulling, try to avoid stairs; hills are better than steps but still uncomfortable    Time 6    Period Weeks    Status On-going    Target Date 09/16/21      PT LONG TERM GOAL #2   Title Pt will report at least a 50% reduction in pain with activity.    Baseline not yet    Time 6    Period Weeks    Status On-going    Target Date 09/16/21      PT LONG TERM GOAL #3   Title Pt will be independent with aquatic program and be able to perform aquatic exercises without adverse effects to establish an aquatic program for improved strength, pain, and  tolerance to activity.    Baseline doing okay as of now but requires further progression    Time 6    Period Weeks    Status On-going    Target Date 09/16/21      PT LONG TERM GOAL #4   Title Pt will be able to perform walking program without adverse effects.    Baseline discomfort at 2 mi today    Time 6    Period Weeks    Status On-going    Target Date 09/16/21      PT LONG TERM GOAL #5   Title Pt will demo good understanding of appropriate workout activities and be able to perform workout activities without significant pain.    Baseline requires progression    Time 6    Period Weeks    Status On-going    Target Date 09/16/21      PT LONG TERM GOAL #6   Title Pt will demo 5/5 R hip flex and abd strength and at least 4+/5 hip extension strength for improved tolerance with and performance of functional mobility and workout activities.    Baseline see flowsheet    Time 6    Period Weeks    Status On-going            Pt seen for aquatic therapy today.  Treatment took place in water 3.25-4.8 ft in depth at the [facility] pool. Temp of water was 94.  Pt entered/exited the pool via stairs step to pattern independently with  bilat rail.     Warm up: forward, backward and side stepping/walking cues for increased step length, increased speed, hand placement to increase resistance.     Standing Stretching -Hip flex  and quad stretch using noodle at wall 3 x 30 sec hold -Gastroc and hamstring "runners stretch" on  3 x 30 sec hold -LB stretch pike position at wall 3 x 30 seconds  Proximal strengthening Long leg kicks forward 2 x 15 rapid then x 10 slow Long leg kicks backwards 2 x 15 rapid then x 10 slow  Core Using 2 foam buoys: Knees to chest 2 x 10 Forward to back pendulum x10 Side to side pendulum x10 Flys x10 then x5   Water walking cool down backward x 6 widths.   Pt requires buoyancy for support and to offload joints with strengthening exercises. Viscosity of the water is needed for resistance of strengthening; water current perturbations provides challenge to standing balance unsupported, requiring increased core activation.       Plan - 08/28/21 2051     Clinical Impression Statement Pt directed through proximal LE exercises focused on indep exercises that can be completed  without equipment. She completes long leg flex then extension each initally with rapid alternating movements then slowed. She executes well and they are added to her HEP. She reports compliance with quadriped fire hydrant OKC exercises daily as instructed by on land therapsit.    Stability/Clinical Decision Making Stable/Uncomplicated    Clinical Decision Making Low    Rehab Potential Good    PT Frequency 2x / week    PT Duration 6 weeks    PT Treatment/Interventions Aquatic Therapy;ADLs/Self Care Home Management;Cryotherapy;Electrical Stimulation;DME Instruction;Ultrasound;Moist Heat;Gait training;Stair training;Functional mobility training;Therapeutic activities;Therapeutic exercise;Balance training;Neuromuscular re-education;Manual techniques;Patient/family education;Passive range of motion;Dry needling;Taping    PT Next  Visit Plan hip abd group strengthening    PT Home Exercise Plan Access Code: [email or web address] aquatics added.  Patient will benefit from skilled therapeutic intervention in order to improve the following deficits and impairments:  Decreased activity tolerance, Decreased range of motion, Decreased strength, Hypomobility, Decreased mobility, Pain  Visit Diagnosis: Abnormal posture  Chronic pain of right knee  Difficulty in walking, not elsewhere classified  Muscle weakness (generalized)  Other abnormalities of gait and mobility  Unsteadiness on feet     Problem List Patient Active Problem List   Diagnosis Date Noted   Thoracic back pain 05/19/2020   Prediabetes 01/22/2020   Weight gain 01/22/2020   Osteopenia 01/22/2020   Osteoarthritis 07/17/2019   Family history of heart disease 12/11/2018   Atypical chest pain 12/11/2018   B12 deficiency 10/16/2017   Elevated BP without diagnosis of hypertension 10/01/2017   Osteoporosis 10/01/2017   Uterine fibroid 07/31/2017   History of cleft palate 11/22/2016   Velopharyngeal insufficiency (VPI), 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   Normocytic anemia 07/31/2016   Screen for colon cancer 07/31/2016   Iron deficiency 04/27/2016   Hypothyroidism 12/17/2012   Insulin resistance 12/17/2012   Auras 10/15/2012   Hashimoto's thyroiditis 07/24/2012   Hyperlipidemia 07/24/2012   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 [place], [place], [address] Phone: [phone]   Fax:  [phone]  Name: Lashawna Poche MRN: [id number] Date of Birth: [birth year]

## 2021-08-30 ENCOUNTER — Ambulatory Visit (INDEPENDENT_AMBULATORY_CARE_PROVIDER_SITE_OTHER): Payer: BC Managed Care – PPO | Admitting: Adult Health

## 2021-08-30 ENCOUNTER — Encounter (INDEPENDENT_AMBULATORY_CARE_PROVIDER_SITE_OTHER): Payer: Self-pay | Admitting: Adult Health

## 2021-08-30 ENCOUNTER — Other Ambulatory Visit: Payer: Self-pay

## 2021-08-30 VITALS — BP 110/72 | HR 85 | Temp 98.2°F | Ht 63.0 in | Wt 165.0 lb

## 2021-08-30 DIAGNOSIS — Z6830 Body mass index (BMI) 30.0-30.9, adult: Secondary | ICD-10-CM | POA: Diagnosis not present

## 2021-08-30 DIAGNOSIS — R7303 Prediabetes: Secondary | ICD-10-CM | POA: Diagnosis not present

## 2021-08-30 DIAGNOSIS — E669 Obesity, unspecified: Secondary | ICD-10-CM

## 2021-08-30 NOTE — Progress Notes (Signed)
Chief Complaint:   OBESITY Kim Kim is here to discuss her progress with her obesity treatment plan along with follow-up of her obesity related diagnoses. Kim Kim is on keeping a food journal and adhering to recommended goals of 1100 calories and 75 grams of protein and states she is following her eating plan approximately 0% of the time. Kim Kim states she is going to the gym for 60 minutes 4 times per week.  Today's visit was #: 4 Starting weight: 166 lbs Starting date: 07/12/2021 Today's weight: 165 lbs Today's date: 08/30/2021 Total lbs lost to date: 1 lb Total lbs lost since last in-office visit: 1 lb  Interim History:  Kim Kim had her first appointment on 07/12/2021, her second appointment on 07/26/2021, and her third appointment on 08/16/2021.  She says she has closed on her home in [place], [place].  When traveling, she will follow [facility] mindset - unable to track.  Of note:  PCP in [place] - travels back to [facility] frequently. She is interested in establishing with local PCP.  Subjective:   1. Pre-diabetes On 07/12/2021, insulin level 9.5.  Mother and sister both have T2D. On 09/06/2020, A1c 6.1; 07/14/2021, A1c 5.7.  Assessment/Plan:   1. Pre-diabetes Decrease carbohydrates/sugar, increase protein, continue [place] exercise.  2. Obesity with current BMI of 29.2  Kim Kim is currently in the action stage of change. As such, her goal is to continue with weight loss efforts. She has agreed to keeping a food journal and adhering to recommended goals of 1100 calories and 75 grams of protein.   Strive to follow 10:1 ratio when reading labels.  Exercise goals:  As is.  Behavioral modification strategies: increasing lean protein intake, decreasing simple carbohydrates, meal planning and cooking strategies, keeping healthy foods in the home, and planning for success.  Kim Kim has agreed to follow-up with our clinic in 2 weeks. She was informed of the importance of frequent  follow-up visits to maximize her success with intensive lifestyle modifications for her multiple health conditions.   Objective:   Blood pressure 110/72, pulse 85, temperature 98.2 F (36.8 C), height 5\' 3"  (1.6 m), weight 165 lb (74.8 kg), SpO2 98 %. Body mass index is 29.23 kg/m.  General: Cooperative, alert, well developed, in no acute distress. HEENT: Conjunctivae and lids unremarkable. Cardiovascular: Regular rhythm.  Lungs: Normal work of breathing. Neurologic: No focal deficits.   Lab Results  Component Value Date   CREATININE 0.99 07/12/2021   BUN 16 07/12/2021   NA 140 07/12/2021   K 4.6 07/12/2021   CL 98 07/12/2021   CO2 26 07/12/2021   Lab Results  Component Value Date   ALT 16 07/12/2021   AST 26 07/12/2021   ALKPHOS 119 07/12/2021   BILITOT 0.4 07/12/2021   Lab Results  Component Value Date   HGBA1C 6.1 09/06/2020   HGBA1C 5.8 01/07/2020   Lab Results  Component Value Date   INSULIN 9.5 07/12/2021   Lab Results  Component Value Date   TSH 2.27 09/06/2020   Lab Results  Component Value Date   VD25OH 36.99 09/06/2020   VD25OH 34.95 01/07/2020   Lab Results  Component Value Date   WBC 7.1 09/29/2020   HGB 12.0 09/29/2020   HCT 36.1 09/29/2020   MCV 80.8 09/29/2020   PLT 242.0 09/29/2020   Lab Results  Component Value Date   IRON 44 (L) 09/29/2020   TIBC 360 09/29/2020   FERRITIN 22.7 09/06/2020   Attestation Statements:  Reviewed by clinician on day of visit: allergies, medications, problem list, medical history, surgical history, family history, social history, and previous encounter notes.  Time spent on visit including pre-visit chart review and post-visit care and charting was 35 minutes.   I, Water quality scientist, CMA, am acting as Location manager for [name], NP.  I have reviewed the above documentation for accuracy and completeness, and I agree with the above. -  Redell Bhandari d. Brighton Pilley, NP-C

## 2021-08-31 ENCOUNTER — Ambulatory Visit (HOSPITAL_BASED_OUTPATIENT_CLINIC_OR_DEPARTMENT_OTHER): Payer: BC Managed Care – PPO | Admitting: Physical Therapy

## 2021-09-02 ENCOUNTER — Ambulatory Visit (HOSPITAL_BASED_OUTPATIENT_CLINIC_OR_DEPARTMENT_OTHER): Payer: BC Managed Care – PPO | Admitting: Physical Therapy

## 2021-09-05 ENCOUNTER — Ambulatory Visit (HOSPITAL_BASED_OUTPATIENT_CLINIC_OR_DEPARTMENT_OTHER): Payer: BC Managed Care – PPO | Attending: Family Medicine | Admitting: Physical Therapy

## 2021-09-05 ENCOUNTER — Encounter (HOSPITAL_BASED_OUTPATIENT_CLINIC_OR_DEPARTMENT_OTHER): Payer: Self-pay | Admitting: Physical Therapy

## 2021-09-05 ENCOUNTER — Other Ambulatory Visit: Payer: Self-pay

## 2021-09-05 DIAGNOSIS — R293 Abnormal posture: Secondary | ICD-10-CM | POA: Diagnosis not present

## 2021-09-05 DIAGNOSIS — R262 Difficulty in walking, not elsewhere classified: Secondary | ICD-10-CM | POA: Insufficient documentation

## 2021-09-05 DIAGNOSIS — G8929 Other chronic pain: Secondary | ICD-10-CM | POA: Insufficient documentation

## 2021-09-05 DIAGNOSIS — M25561 Pain in right knee: Secondary | ICD-10-CM | POA: Diagnosis not present

## 2021-09-05 DIAGNOSIS — M6281 Muscle weakness (generalized): Secondary | ICD-10-CM | POA: Insufficient documentation

## 2021-09-05 DIAGNOSIS — R2689 Other abnormalities of gait and mobility: Secondary | ICD-10-CM | POA: Insufficient documentation

## 2021-09-05 DIAGNOSIS — R2681 Unsteadiness on feet: Secondary | ICD-10-CM | POA: Insufficient documentation

## 2021-09-05 NOTE — Therapy (Signed)
[facility] [address] [place], [place], [address] Phone: [phone]   Fax:  [phone]  Physical Therapy Treatment/Re-Certification  Patient Details  Name: Kim Kim MRN: [id number] Date of Birth: 15-Jul-1956 Referring Provider (PT): Nyoka Cowden, MD   Encounter Date: 09/05/2021   PT End of Session - 09/05/21 1315     Visit Number 13    Number of Visits 21    Date for PT Re-Evaluation 10/21/21    Authorization Type BCBS    PT Start Time 1315    PT Stop Time 46    PT Time Calculation (min) 43 min    Activity Tolerance Patient tolerated treatment well    Behavior During Therapy WFL for tasks assessed/performed             Past Medical History:  Diagnosis Date   Allergy    seasonal allergies   Anemia    hx of   Carotid atherosclerosis    Epilepsy ([place])    Gastric ulcer    hx of   GERD (gastroesophageal reflux disease)    on meds   Growth hormone deficiency ([place])    Hiatal hernia    Hiatal hernia    Hypothyroidism    hx of Hashimoto thyroiditis   Pituitary adenoma ([place])    hx of   Seizures ([place])    temporal lobe   Thyroid disease    on meds   Vitamin D deficiency     Past Surgical History:  Procedure Laterality Date   COLONOSCOPY  12/10/2015   [place] Dr Meredith Leeds   ESOPHAGOGASTRODUODENOSCOPY  06/03/2019   Dr Celso Sickle, [place] EXTRACTION      There were no vitals filed for this visit.   Subjective Assessment - 09/05/21 1444     Subjective I am doing better.    Currently in Pain? Yes    Pain Score 4     Pain Location Knee    Pain Orientation Right    Pain Descriptors / Indicators Aching    Aggravating Factors  weight bearing    Pain Relieving Factors rest                [facility] PT Assessment - 09/05/21 0001       Assessment   Medical Diagnosis Primary OA of R knee    Referring Provider (PT) Nyoka Cowden, MD    Onset Date/Surgical Date --   2019    Hand Dominance Right    Prior Therapy Pt has received PT for knee twice in the past      Observation/Other Assessments   Other Surveys  Lower Extremity Functional Scale    Lower Extremity Functional Scale  53/80      Strength   Right Hip Flexion 4+/5    Right Hip ABduction 4+/5    Left Hip Flexion 4/5    Left Hip ABduction 5/5      Ambulation/Gait   Gait Comments BOS is WFL, supinated at heel strike with lateral wear in both shoes                           [facility] Adult PT Treatment/Exercise - 09/05/21 0001       Knee/Hip Exercises: Standing   Gait Training heel strike with midlie weight placement to reduce supination    Other Standing Knee Exercises upright posture- weight shift to heels & midline with abdominal engagement  Knee/Hip Exercises: Sidelying   Other Sidelying Knee/Hip Exercises sidelying diagonal abduction    Other Sidelying Knee/Hip Exercises 90/90 lift with alt clam and reverse clam                     PT Education - 09/05/21 1445     Education Details goals & progress, POC    Person(s) Educated Patient    Methods Explanation;Demonstration;Tactile cues;Verbal cues;Handout    Comprehension Verbalized understanding;Returned demonstration;Verbal cues required;Tactile cues required;Need further instruction              PT Short Term Goals - 08/17/21 0854       PT SHORT TERM GOAL #1   Title pt will be independent with land based HEP for improved ROM, pain, strength, and function.    Status Achieved      PT SHORT TERM GOAL #2   Title Pt will demo improved R knee extension AROM to 0 deg for improved stiffness and mobility.    Baseline R knee extension 0 deg, reports less stiffness    Status Achieved      PT SHORT TERM GOAL #3   Title Pt will demo improved hip extension strength to 4/5 MMT bilat for improved mobility and tolerance with activities.    Status Achieved      PT SHORT TERM GOAL #4   Title Pt will tolerate  aquatic exercises well without adverse effects for improved tolerance to activity.    Status Achieved               PT Long Term Goals - 09/05/21 1317       PT LONG TERM GOAL #1   Title Pt will perform a reciprocal gait on stairs without significant R knee pain.    Baseline the worst is still inner thighs    Status On-going    Target Date 10/21/21      PT LONG TERM GOAL #2   Title Pt will report at least a 50% reduction in pain with activity.    Status Achieved      PT LONG TERM GOAL #3   Title Pt will be independent with aquatic program and be able to perform aquatic exercises without adverse effects to establish an aquatic program for improved strength, pain, and tolerance to activity.    Baseline is doing the exercises but requires further progression    Target Date 10/21/21      PT LONG TERM GOAL #4   Title Pt will be able to perform walking program without adverse effects.    Baseline 2 miles depending on terraine, plan to try 3 miles    Status On-going    Target Date 10/21/21      PT LONG TERM GOAL #5   Title Pt will demo good understanding of appropriate workout activities and be able to perform workout activities without significant pain.    Baseline requires progression    Status On-going    Target Date 10/21/21      PT LONG TERM GOAL #6   Title Pt will demo 5/5 R hip flex and abd strength and at least 4+/5 hip extension strength for improved tolerance with and performance of functional mobility and workout activities.    Baseline see flowsheet    Status On-going    Target Date 10/21/21                   Plan - 09/05/21 1446  Clinical Impression Statement Pt cont to demo progress in objective measures as well as subjective reports in LEFS. Pain is decreasing and she has incoorporated her exercises/stretches into her workout routine. At this point I will extend POC as she would benefit from further progression of her exercises to continue  improvements. Worked on weight placement through feet in gait and she reported decrease in pressure noted through her knees. Will cont to benefit from skilled PT to improve stability, strength and function to meet long term goals.    PT Frequency 2x / week    PT Duration 6 weeks    PT Treatment/Interventions Aquatic Therapy;ADLs/Self Care Home Management;Cryotherapy;Electrical Stimulation;DME Instruction;Ultrasound;Moist Heat;Gait training;Stair training;Functional mobility training;Therapeutic activities;Therapeutic exercise;Balance training;Neuromuscular re-education;Manual techniques;Patient/family education;Passive range of motion;Dry needling;Taping    PT Next Visit Plan core, gluts, stairs/steps in pool, balance with core/hip awareness    PT Home Exercise Plan Access Code: [email or web address] aquatics added.    Consulted and Agree with Plan of Care Patient             Patient will benefit from skilled therapeutic intervention in order to improve the following deficits and impairments:  Decreased activity tolerance, Decreased range of motion, Decreased strength, Hypomobility, Decreased mobility, Pain  Visit Diagnosis: Abnormal posture - Plan: PT plan of care cert/re-cert  Chronic pain of right knee - Plan: PT plan of care cert/re-cert  Difficulty in walking, not elsewhere classified - Plan: PT plan of care cert/re-cert  Muscle weakness (generalized) - Plan: PT plan of care cert/re-cert     Problem List Patient Active Problem List   Diagnosis Date Noted   Thoracic back pain 05/19/2020   Prediabetes 01/22/2020   Weight gain 01/22/2020   Osteopenia 01/22/2020   Osteoarthritis 07/17/2019   Family history of heart disease 12/11/2018   Atypical chest pain 12/11/2018   B12 deficiency 10/16/2017   Elevated BP without diagnosis of hypertension 10/01/2017   Osteoporosis 10/01/2017   Uterine fibroid 07/31/2017   History of cleft palate 11/22/2016   Velopharyngeal insufficiency (VPI),  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   Normocytic anemia 07/31/2016   Screen for colon cancer 07/31/2016   Iron deficiency 04/27/2016   Hypothyroidism 12/17/2012   Insulin resistance 12/17/2012   [place] 10/15/2012   Hashimoto's thyroiditis 07/24/2012   Hyperlipidemia 07/24/2012   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 Uniqua Kihn PT, DPT 09/05/21 2:51 PM   [facility] [address] [place], [place], [address] Phone: [phone]   Fax:  [phone]  Name: Kim Kim MRN: [id number] Date of Birth: 12-21-1955

## 2021-09-14 ENCOUNTER — Encounter: Payer: Self-pay | Admitting: Family Medicine

## 2021-09-14 ENCOUNTER — Ambulatory Visit (INDEPENDENT_AMBULATORY_CARE_PROVIDER_SITE_OTHER): Payer: BC Managed Care – PPO | Admitting: Bariatrics

## 2021-09-14 ENCOUNTER — Ambulatory Visit (HOSPITAL_BASED_OUTPATIENT_CLINIC_OR_DEPARTMENT_OTHER): Payer: BC Managed Care – PPO | Admitting: Physical Therapy

## 2021-09-14 ENCOUNTER — Encounter (HOSPITAL_BASED_OUTPATIENT_CLINIC_OR_DEPARTMENT_OTHER): Payer: Self-pay | Admitting: Physical Therapy

## 2021-09-14 ENCOUNTER — Other Ambulatory Visit: Payer: Self-pay

## 2021-09-14 ENCOUNTER — Encounter (INDEPENDENT_AMBULATORY_CARE_PROVIDER_SITE_OTHER): Payer: Self-pay | Admitting: Bariatrics

## 2021-09-14 ENCOUNTER — Ambulatory Visit (INDEPENDENT_AMBULATORY_CARE_PROVIDER_SITE_OTHER): Payer: BC Managed Care – PPO | Admitting: Family Medicine

## 2021-09-14 VITALS — BP 107/75 | HR 92 | Temp 97.5°F | Ht 63.0 in | Wt 164.0 lb

## 2021-09-14 VITALS — BP 124/82 | Ht 64.0 in | Wt 165.0 lb

## 2021-09-14 DIAGNOSIS — E7849 Other hyperlipidemia: Secondary | ICD-10-CM | POA: Diagnosis not present

## 2021-09-14 DIAGNOSIS — M25561 Pain in right knee: Secondary | ICD-10-CM | POA: Diagnosis not present

## 2021-09-14 DIAGNOSIS — M25511 Pain in right shoulder: Secondary | ICD-10-CM

## 2021-09-14 DIAGNOSIS — R262 Difficulty in walking, not elsewhere classified: Secondary | ICD-10-CM | POA: Diagnosis not present

## 2021-09-14 DIAGNOSIS — Z6830 Body mass index (BMI) 30.0-30.9, adult: Secondary | ICD-10-CM | POA: Diagnosis not present

## 2021-09-14 DIAGNOSIS — R2689 Other abnormalities of gait and mobility: Secondary | ICD-10-CM

## 2021-09-14 DIAGNOSIS — M6281 Muscle weakness (generalized): Secondary | ICD-10-CM | POA: Diagnosis not present

## 2021-09-14 DIAGNOSIS — E669 Obesity, unspecified: Secondary | ICD-10-CM

## 2021-09-14 DIAGNOSIS — R293 Abnormal posture: Secondary | ICD-10-CM

## 2021-09-14 DIAGNOSIS — M25512 Pain in left shoulder: Secondary | ICD-10-CM | POA: Diagnosis not present

## 2021-09-14 DIAGNOSIS — R2681 Unsteadiness on feet: Secondary | ICD-10-CM | POA: Diagnosis not present

## 2021-09-14 DIAGNOSIS — R632 Polyphagia: Secondary | ICD-10-CM

## 2021-09-14 DIAGNOSIS — G8929 Other chronic pain: Secondary | ICD-10-CM

## 2021-09-14 NOTE — Patient Instructions (Signed)
Your shoulder pain is due to strain of lats, trapezius, serratus muscles. Add physical therapy for these. Do home exercises on days you don't go to therapy. Voltaren gel up to 4 times a day. Heat 15 minutes at a time 3-4 times a day. Follow up with me in 6 weeks for reevaluation.

## 2021-09-14 NOTE — Progress Notes (Signed)
PCP: Pcp, No  Subjective:   HPI: Patient is a 65 y.o. female here for bilateral shoulder pain.  Patient reports she's had some issues with her shoulders including some severe episodes of pain posterior right shoulder. Current pain really worsened after using water weights on Tuesday. Pain posteriorly in trunk, neck, posterior shoulder. No swelling or bruising. No night pain. No other complaints.  Past Medical History:  Diagnosis Date   Allergy    seasonal allergies   Anemia    hx of   Carotid atherosclerosis    Epilepsy ([place])    Gastric ulcer    hx of   GERD (gastroesophageal reflux disease)    on meds   Growth hormone deficiency ([place])    Hiatal hernia    Hiatal hernia    Hypothyroidism    hx of Hashimoto thyroiditis   Pituitary adenoma ([place])    hx of   Seizures ([place])    temporal lobe   Thyroid disease    on meds   Vitamin D deficiency     Current Outpatient Medications on File Prior to Visit  Medication Sig Dispense Refill   arginine 500 MG tablet Take 1 tablet (500 mg total) by mouth daily. 60 tablet 2   COVID-19 mRNA bivalent vaccine, Pfizer, (PFIZER COVID-19 VAC BIVALENT) injection Inject into the muscle. 0.3 mL 0   desonide (DESOWEN) 0.05 % cream Apply 1 application topically as needed.     estradiol (ESTRACE) 0.1 MG/GM vaginal cream Place vaginally.     fluticasone (FLONASE) 50 MCG/ACT nasal spray Place into the nose daily as needed.     influenza vac split quadrivalent PF (FLUARIX) 0.5 ML injection Inject into the muscle. 0.5 mL 0   LAMICTAL 100 MG tablet Take 1 tablet (100 mg total) by mouth 2 (two) times daily. 180 tablet 4   levothyroxine (SYNTHROID) 50 MCG tablet Take 50 mcg by mouth daily before breakfast. 75 Mcg every Sunday     omeprazole (PRILOSEC) 40 MG capsule Take 1 capsule (40 mg total) by mouth in the morning and at bedtime. Dispense as brand name Prilosec per patient request. 60 capsule 3   rosuvastatin (CRESTOR) 10 MG tablet Take 10 mg by mouth  at bedtime.     No current facility-administered medications on file prior to visit.    Past Surgical History:  Procedure Laterality Date   COLONOSCOPY  12/10/2015   [place] Dr Meredith Leeds   ESOPHAGOGASTRODUODENOSCOPY  06/03/2019   Dr Celso Sickle, [place]   WISDOM TOOTH EXTRACTION      No Known Allergies  BP 124/82   Ht 5\' 4"  (1.626 m)   Wt 165 lb (74.8 kg)   BMI 28.32 kg/m   [facility] Adult Exercise 09/14/2021  Frequency of aerobic exercise (# of days/week) 4  Average time in minutes 60  Frequency of strengthening activities (# of days/week) 4    No flowsheet data found.      Objective:  Physical Exam:  Gen: NAD, comfortable in exam room  Right shoulder: No swelling, ecchymoses.  No gross deformity.   TTP trapezius, medial to scapula, within lat. FROM. Negative Hawkins, Neers. Negative Yergasons. Strength 5/5 with empty can and resisted internal/external rotation. Negative apprehension. NV intact distally.  Left shoulder: No swelling, ecchymoses.  No gross deformity. TTP trapezius, medial to scapula, within lat. FROM. Negative Hawkins, Neers. Negative Yergasons. Strength 5/5 with empty can and resisted internal/external rotation. Negative apprehension. NV intact distally.   Assessment & Plan:  1. Bilateral shoulder pain - patient's pain is more posterior in scapular stabilizers, lats.  Exam otherwise reassuring.  Will add physical therapy.  Home exercises.  Voltaren gel, heat.  F/u in 6 weeks.

## 2021-09-14 NOTE — Progress Notes (Signed)
Chief Complaint:   OBESITY Kim Kim is here to discuss her progress with her obesity treatment plan along with follow-up of her obesity related diagnoses. Kim Kim is on keeping a food journal and adhering to recommended goals of 1100 calories and 75 grams of  protein and states she is following her eating plan approximately 0% of the time. Kim Kim states she is doing water aerobics for 60 minutes 4 times per week.  Today's visit was #: 5 Starting weight: 166 lbs Starting date: 07/12/2021 Today's weight: 164 lbs Today's date: 09/14/2021 Total lbs lost to date: 2 lbs Total lbs lost since last in-office visit: 1 lb  Interim History: Kim Kim is down 1 lb since her last visit. She has been traveling more which makes it difficult.  Subjective:   1. Other hyperlipidemia Kim Kim is currently taking Crestor.  2. Polyphagia Kim Kim is not on medications currently. Her levels are normal.  Assessment/Plan:   1. Other hyperlipidemia Cardiovascular risk and specific lipid/LDL goals reviewed.  We discussed several lifestyle modifications today and Kim Kim will continue to work on diet, exercise and weight loss efforts. Kim Kim will continue her medications. Orders and follow up as documented in patient record.   Counseling Intensive lifestyle modifications are the first line treatment for this issue. Dietary changes: Increase soluble fiber. Decrease simple carbohydrates. Exercise changes: Moderate to vigorous-intensity aerobic activity 150 minutes per week if tolerated. Lipid-lowering medications: see documented in medical record.   2. Polyphagia Intensive lifestyle modifications are the first line treatment for this issue. We discussed several lifestyle modifications today and she will continue to work on diet, exercise and weight loss efforts. Kim Kim will increase her water and protein. Orders and follow up as documented in patient record.  Counseling Polyphagia is excessive hunger. Causes can  include: low blood sugars, hypERthyroidism, PMS, lack of sleep, stress, insulin resistance, diabetes, certain medications, and diets that are deficient in protein and fiber.    3. Obesity with current BMI of 29.2 Kim Kim is currently in the action stage of change. As such, her goal is to continue with weight loss efforts. She has agreed to keeping a food journal and adhering to recommended goals of 1100 calories and 75 grams of protein.   Kim Kim will continue meal planning and she will continue intentional eating. She will get a new scale.  Exercise goals: As is. Kim Kim will increase exercise.  Behavioral modification strategies: increasing lean protein intake, decreasing simple carbohydrates, increasing vegetables, increasing water intake, decreasing eating out, no skipping meals, meal planning and cooking strategies, keeping healthy foods in the home, and planning for success.  Kim Kim has agreed to follow-up with our clinic in 3 weeks with [name], NP or Jake Bathe, [place]. She was informed of the importance of frequent follow-up visits to maximize her success with intensive lifestyle modifications for her multiple health conditions.   Objective:   Blood pressure 107/75, pulse 92, temperature (!) 97.5 F (36.4 C), height 5\' 3"  (1.6 m), weight 164 lb (74.4 kg), SpO2 97 %. Body mass index is 29.05 kg/m.  General: Cooperative, alert, well developed, in no acute distress. HEENT: Conjunctivae and lids unremarkable. Cardiovascular: Regular rhythm.  Lungs: Normal work of breathing. Neurologic: No focal deficits.   Lab Results  Component Value Date   CREATININE 0.99 07/12/2021   BUN 16 07/12/2021   NA 140 07/12/2021   K 4.6 07/12/2021   CL 98 07/12/2021   CO2 26 07/12/2021   Lab Results  Component Value Date  ALT 16 07/12/2021   AST 26 07/12/2021   ALKPHOS 119 07/12/2021   BILITOT 0.4 07/12/2021   Lab Results  Component Value Date   HGBA1C 6.1 09/06/2020   HGBA1C 5.8  01/07/2020   Lab Results  Component Value Date   INSULIN 9.5 07/12/2021   Lab Results  Component Value Date   TSH 2.27 09/06/2020   No results found for: CHOL, HDL, LDLCALC, LDLDIRECT, TRIG, CHOLHDL Lab Results  Component Value Date   VD25OH 36.99 09/06/2020   VD25OH 34.95 01/07/2020   Lab Results  Component Value Date   WBC 7.1 09/29/2020   HGB 12.0 09/29/2020   HCT 36.1 09/29/2020   MCV 80.8 09/29/2020   PLT 242.0 09/29/2020   Lab Results  Component Value Date   IRON 44 (L) 09/29/2020   TIBC 360 09/29/2020   FERRITIN 22.7 09/06/2020   Attestation Statements:   Reviewed by clinician on day of visit: allergies, medications, problem list, medical history, surgical history, family history, social history, and previous encounter notes.  Time spent on visit including pre-visit chart review and post-visit care and charting was 30 minutes.   I, Lizbeth Bark, RMA, am acting as Location manager for [employer], DO.   I have reviewed the above documentation for accuracy and completeness, and I agree with the above. Jearld Lesch, DO

## 2021-09-14 NOTE — Therapy (Addendum)
[facility] [address] [place], [place], [address] Phone: [phone]   Fax:  [phone]  Physical Therapy Treatment  Patient Details  Name: Kim Kim MRN: [id number] Date of Birth: 02-04-1956 Referring Provider (PT): Nyoka Cowden, MD   Encounter Date: 09/14/2021   PT End of Session - 09/14/21 9563     Visit Number 14    Number of Visits 21    Date for PT Re-Evaluation 10/21/21    Authorization Type BCBS    PT Start Time 0815    PT Stop Time 0900    PT Time Calculation (min) 45 min    Equipment Utilized During Treatment Other (comment);Gait belt    Activity Tolerance Patient tolerated treatment well    Behavior During Therapy WFL for tasks assessed/performed             Past Medical History:  Diagnosis Date   Allergy    seasonal allergies   Anemia    hx of   Carotid atherosclerosis    Epilepsy ([place])    Gastric ulcer    hx of   GERD (gastroesophageal reflux disease)    on meds   Growth hormone deficiency ([place])    Hiatal hernia    Hiatal hernia    Hypothyroidism    hx of Hashimoto thyroiditis   Pituitary adenoma ([place])    hx of   Seizures ([place])    temporal lobe   Thyroid disease    on meds   Vitamin D deficiency     Past Surgical History:  Procedure Laterality Date   COLONOSCOPY  12/10/2015   [place] Dr Meredith Leeds   ESOPHAGOGASTRODUODENOSCOPY  06/03/2019   Dr Celso Sickle, [place] EXTRACTION      There were no vitals filed for this visit.   Subjective Assessment - 09/14/21 0820     Subjective Knee doesn't hurt, just feels tight.    Currently in Pain? No/denies    Pain Onset More than a month ago                                          PT Short Term Goals - 08/17/21 0854       PT SHORT TERM GOAL #1   Title pt will be independent with land based HEP for improved ROM, pain, strength, and function.    Status Achieved      PT SHORT  TERM GOAL #2   Title Pt will demo improved R knee extension AROM to 0 deg for improved stiffness and mobility.    Baseline R knee extension 0 deg, reports less stiffness    Status Achieved      PT SHORT TERM GOAL #3   Title Pt will demo improved hip extension strength to 4/5 MMT bilat for improved mobility and tolerance with activities.    Status Achieved      PT SHORT TERM GOAL #4   Title Pt will tolerate aquatic exercises well without adverse effects for improved tolerance to activity.    Status Achieved               PT Long Term Goals - 09/05/21 1317       PT LONG TERM GOAL #1   Title Pt will perform a reciprocal gait on stairs without significant R knee pain.    Baseline the worst is still  inner thighs    Status On-going    Target Date 10/21/21      PT LONG TERM GOAL #2   Title Pt will report at least a 50% reduction in pain with activity.    Status Achieved      PT LONG TERM GOAL #3   Title Pt will be independent with aquatic program and be able to perform aquatic exercises without adverse effects to establish an aquatic program for improved strength, pain, and tolerance to activity.    Baseline is doing the exercises but requires further progression    Target Date 10/21/21      PT LONG TERM GOAL #4   Title Pt will be able to perform walking program without adverse effects.    Baseline 2 miles depending on terraine, plan to try 3 miles    Status On-going    Target Date 10/21/21      PT LONG TERM GOAL #5   Title Pt will demo good understanding of appropriate workout activities and be able to perform workout activities without significant pain.    Baseline requires progression    Status On-going    Target Date 10/21/21      PT LONG TERM GOAL #6   Title Pt will demo 5/5 R hip flex and abd strength and at least 4+/5 hip extension strength for improved tolerance with and performance of functional mobility and workout activities.    Baseline see flowsheet     Status On-going    Target Date 10/21/21            Pt seen for aquatic therapy today.  Treatment took place in water 3.25-4.8 ft in depth at the [facility] pool. Temp of water was 94.  Pt entered/exited the pool via stairs step to pattern independently with bilat rail.     Warm up: forward, backward and side stepping/walking cues for increased step length, increased speed, hand placement to increase resistance.     Standing Stretching -Hip flex  and quad stretch using noodle at wall 3 x 30 sec hold -Gastroc and hamstring "runners stretch" on  3 x 30 sec hold -LB stretch pike position at wall 3 x 30 seconds   Proximal strengthening Long leg kicks forward 2 x 15 rapid then x 10 slow Long leg kicks backwards 2 x 15 rapid then x 10 slow Add x10 Abd x10   Core/balance Pilates Modified planks Planks Reverse planks   Water walking cool down backward x 6 widths.   Pt requires buoyancy for support and to offload joints with strengthening exercises. Viscosity of the water is needed for resistance of strengthening; water current perturbations provides challenge to standing balance unsupported, requiring increased core activation.       Plan - 09/14/21 0831     Clinical Impression Statement Introduced Pilates           Clinical Impression Introduced Pilates and concentrated on mindful completion of exercises focusing on core strength and balance. Pt with difficulty gaining modified plank and plank posiioning. VC and demo for proper execution. With practice pt's core control improves. Decrease pain in knee. Pt reports compliance with HEP.  Patient will benefit from skilled therapeutic intervention in order to improve the following deficits and impairments:  Decreased activity tolerance, Decreased range of motion, Decreased strength, Hypomobility, Decreased mobility, Pain  Visit Diagnosis: Abnormal posture  Chronic pain of right knee  Difficulty in walking, not  elsewhere classified  Muscle weakness (generalized)  Other abnormalities  of gait and mobility  Unsteadiness on feet     Problem List Patient Active Problem List   Diagnosis Date Noted   Thoracic back pain 05/19/2020   Prediabetes 01/22/2020   Weight gain 01/22/2020   Osteopenia 01/22/2020   Osteoarthritis 07/17/2019   Family history of heart disease 12/11/2018   Atypical chest pain 12/11/2018   B12 deficiency 10/16/2017   Elevated BP without diagnosis of hypertension 10/01/2017   Osteoporosis 10/01/2017   Uterine fibroid 07/31/2017   History of cleft palate 11/22/2016   Velopharyngeal insufficiency (VPI), 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   Normocytic anemia 07/31/2016   Screen for colon cancer 07/31/2016   Iron deficiency 04/27/2016   Hypothyroidism 12/17/2012   Insulin resistance 12/17/2012   Auras 10/15/2012   Hashimoto's thyroiditis 07/24/2012   Hyperlipidemia 07/24/2012   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   Basal metabolism function study abnormality 03/07/2012   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

## 2021-09-15 ENCOUNTER — Encounter: Payer: Self-pay | Admitting: Family Medicine

## 2021-09-16 ENCOUNTER — Ambulatory Visit (HOSPITAL_BASED_OUTPATIENT_CLINIC_OR_DEPARTMENT_OTHER): Payer: BC Managed Care – PPO | Admitting: Physical Therapy

## 2021-09-16 ENCOUNTER — Encounter (HOSPITAL_BASED_OUTPATIENT_CLINIC_OR_DEPARTMENT_OTHER): Payer: Self-pay | Admitting: Physical Therapy

## 2021-09-16 ENCOUNTER — Other Ambulatory Visit: Payer: Self-pay

## 2021-09-16 DIAGNOSIS — M25561 Pain in right knee: Secondary | ICD-10-CM | POA: Diagnosis not present

## 2021-09-16 DIAGNOSIS — M6281 Muscle weakness (generalized): Secondary | ICD-10-CM

## 2021-09-16 DIAGNOSIS — R2689 Other abnormalities of gait and mobility: Secondary | ICD-10-CM | POA: Diagnosis not present

## 2021-09-16 DIAGNOSIS — R2681 Unsteadiness on feet: Secondary | ICD-10-CM

## 2021-09-16 DIAGNOSIS — G8929 Other chronic pain: Secondary | ICD-10-CM

## 2021-09-16 DIAGNOSIS — R293 Abnormal posture: Secondary | ICD-10-CM

## 2021-09-16 DIAGNOSIS — R262 Difficulty in walking, not elsewhere classified: Secondary | ICD-10-CM

## 2021-09-16 NOTE — Therapy (Signed)
[facility] [address] [place], [place], [address] Phone: [phone]   Fax:  [phone]  Physical Therapy Treatment  Patient Details  Name: Kim Kim MRN: [id number] Date of Birth: [birth year] Referring Provider (PT): Nyoka Cowden, MD   Encounter Date: 09/16/2021   PT End of Session - 09/16/21 0836     Visit Number 15    Number of Visits 21    Date for PT Re-Evaluation 10/21/21    Authorization Type BCBS    PT Start Time 0815    PT Stop Time 0905    PT Time Calculation (min) 50 min    Equipment Utilized During Treatment Other (comment);Gait belt    Activity Tolerance Patient tolerated treatment well    Behavior During Therapy WFL for tasks assessed/performed             Past Medical History:  Diagnosis Date   Allergy    seasonal allergies   Anemia    hx of   Carotid atherosclerosis    Epilepsy ([place])    Gastric ulcer    hx of   GERD (gastroesophageal reflux disease)    on meds   Growth hormone deficiency ([place])    Hiatal hernia    Hiatal hernia    Hypothyroidism    hx of Hashimoto thyroiditis   Pituitary adenoma ([place])    hx of   Seizures ([place])    temporal lobe   Thyroid disease    on meds   Vitamin D deficiency     Past Surgical History:  Procedure Laterality Date   COLONOSCOPY  12/10/2015   [place] Dr Meredith Leeds   ESOPHAGOGASTRODUODENOSCOPY  06/03/2019   Dr Celso Sickle, [place] EXTRACTION      There were no vitals filed for this visit.   Subjective Assessment - 09/16/21 1451     Subjective Still no knee pain, just tightness                                          PT Short Term Goals - 08/17/21 0854       PT SHORT TERM GOAL #1   Title pt will be independent with land based HEP for improved ROM, pain, strength, and function.    Status Achieved      PT SHORT TERM GOAL #2   Title Pt will demo improved R knee extension AROM to 0  deg for improved stiffness and mobility.    Baseline R knee extension 0 deg, reports less stiffness    Status Achieved      PT SHORT TERM GOAL #3   Title Pt will demo improved hip extension strength to 4/5 MMT bilat for improved mobility and tolerance with activities.    Status Achieved      PT SHORT TERM GOAL #4   Title Pt will tolerate aquatic exercises well without adverse effects for improved tolerance to activity.    Status Achieved               PT Long Term Goals - 09/05/21 1317       PT LONG TERM GOAL #1   Title Pt will perform a reciprocal gait on stairs without significant R knee pain.    Baseline the worst is still inner thighs    Status On-going    Target Date 10/21/21  PT LONG TERM GOAL #2   Title Pt will report at least a 50% reduction in pain with activity.    Status Achieved      PT LONG TERM GOAL #3   Title Pt will be independent with aquatic program and be able to perform aquatic exercises without adverse effects to establish an aquatic program for improved strength, pain, and tolerance to activity.    Baseline is doing the exercises but requires further progression    Target Date 10/21/21      PT LONG TERM GOAL #4   Title Pt will be able to perform walking program without adverse effects.    Baseline 2 miles depending on terraine, plan to try 3 miles    Status On-going    Target Date 10/21/21      PT LONG TERM GOAL #5   Title Pt will demo good understanding of appropriate workout activities and be able to perform workout activities without significant pain.    Baseline requires progression    Status On-going    Target Date 10/21/21      PT LONG TERM GOAL #6   Title Pt will demo 5/5 R hip flex and abd strength and at least 4+/5 hip extension strength for improved tolerance with and performance of functional mobility and workout activities.    Baseline see flowsheet    Status On-going    Target Date 10/21/21            Pt seen for  aquatic therapy today.  Treatment took place in water 3.25-4.8 ft in depth at the [facility] pool. Temp of water was 94.  Pt entered/exited the pool via stairs step to pattern independently with bilat rail.     Warm up: forward, backward and side stepping/walking cues for increased step length, increased speed, hand placement to increase resistance.     Standing Stretching -Hip flex  and quad stretch using noodle at wall 3 x 30 sec hold -Gastroc and hamstring "runners stretch" on  3 x 30 sec hold -LB stretch pike position at wall 3 x 30 seconds   Proximal strengthening Long leg kicks forward 2 x 15 rapid then x 10 slow Long leg kicks backwards 2 x 15 rapid then x 10 slow Long leg hip flex then Add x10 Long leg hip ext then Abd x10  -cues for tightened core upright position.  Core/balance Pilates Lat pull down in modified plank position 2x10 yellow noodle. Planks Reverse planks   Water walking cool down backward x 6 widths.   Pt requires buoyancy for support and to offload joints with strengthening exercises. Viscosity of the water is needed for resistance of strengthening; water current perturbations provides challenge to standing balance unsupported, requiring increased core activation.       Plan - 09/16/21 0914     Clinical Impression Statement Pt reprots completing HEP as instruted adding Pilates from last session. Focus today on core and proximal strengthening re-instructing pilates with cues for improvd body awareness and mindfulness for better execution. Pt with good success improving ability to hold plank.  Good core activation with reverse plank but unable to gain position today. VC for improved core stability with long legs/proximal strengthening.    Stability/Clinical Decision Making Stable/Uncomplicated    Clinical Decision Making Low    Rehab Potential Good    PT Frequency 2x / week    PT Duration 6 weeks    PT Treatment/Interventions Aquatic  Therapy;ADLs/Self Care Home Management;Cryotherapy;Electrical Stimulation;DME Instruction;Ultrasound;Moist  Heat;Gait training;Stair training;Functional mobility training;Therapeutic activities;Therapeutic exercise;Balance training;Neuromuscular re-education;Manual techniques;Patient/family education;Passive range of motion;Dry needling;Taping             Patient will benefit from skilled therapeutic intervention in order to improve the following deficits and impairments:  Decreased activity tolerance, Decreased range of motion, Decreased strength, Hypomobility, Decreased mobility, Pain  Visit Diagnosis: Abnormal posture  Chronic pain of right knee  Difficulty in walking, not elsewhere classified  Muscle weakness (generalized)  Unsteadiness on feet  Other abnormalities of gait and mobility     Problem List Patient Active Problem List   Diagnosis Date Noted   Thoracic back pain 05/19/2020   Prediabetes 01/22/2020   Weight gain 01/22/2020   Osteopenia 01/22/2020   Osteoarthritis 07/17/2019   Family history of heart disease 12/11/2018   Atypical chest pain 12/11/2018   B12 deficiency 10/16/2017   Elevated BP without diagnosis of hypertension 10/01/2017   Osteoporosis 10/01/2017   Uterine fibroid 07/31/2017   History of cleft palate 11/22/2016   Velopharyngeal insufficiency (VPI), 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   Normocytic anemia 07/31/2016   Screen for colon cancer 07/31/2016   Iron deficiency 04/27/2016   Hypothyroidism 12/17/2012   Insulin resistance 12/17/2012   Auras 10/15/2012   Hashimoto's thyroiditis 07/24/2012   Hyperlipidemia 07/24/2012   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 [place], [place], [address] Phone: [phone]   Fax:  [phone]  Name: Kim Kim MRN: [id number] Date of Birth: [birth year]

## 2021-09-20 DIAGNOSIS — L84 Corns and callosities: Secondary | ICD-10-CM | POA: Diagnosis not present

## 2021-09-20 DIAGNOSIS — R208 Other disturbances of skin sensation: Secondary | ICD-10-CM | POA: Diagnosis not present

## 2021-09-20 LAB — HM PAP SMEAR

## 2021-09-28 ENCOUNTER — Other Ambulatory Visit: Payer: Self-pay

## 2021-09-28 ENCOUNTER — Encounter (HOSPITAL_BASED_OUTPATIENT_CLINIC_OR_DEPARTMENT_OTHER): Payer: Self-pay | Admitting: Physical Therapy

## 2021-09-28 ENCOUNTER — Ambulatory Visit (HOSPITAL_BASED_OUTPATIENT_CLINIC_OR_DEPARTMENT_OTHER): Payer: BC Managed Care – PPO | Admitting: Physical Therapy

## 2021-09-28 ENCOUNTER — Ambulatory Visit (INDEPENDENT_AMBULATORY_CARE_PROVIDER_SITE_OTHER): Payer: BC Managed Care – PPO | Admitting: Bariatrics

## 2021-09-28 DIAGNOSIS — M25561 Pain in right knee: Secondary | ICD-10-CM

## 2021-09-28 DIAGNOSIS — G8929 Other chronic pain: Secondary | ICD-10-CM | POA: Diagnosis not present

## 2021-09-28 DIAGNOSIS — R293 Abnormal posture: Secondary | ICD-10-CM | POA: Diagnosis not present

## 2021-09-28 DIAGNOSIS — R262 Difficulty in walking, not elsewhere classified: Secondary | ICD-10-CM

## 2021-09-28 DIAGNOSIS — R2689 Other abnormalities of gait and mobility: Secondary | ICD-10-CM | POA: Diagnosis not present

## 2021-09-28 DIAGNOSIS — R2681 Unsteadiness on feet: Secondary | ICD-10-CM | POA: Diagnosis not present

## 2021-09-28 DIAGNOSIS — M6281 Muscle weakness (generalized): Secondary | ICD-10-CM

## 2021-09-28 NOTE — Therapy (Signed)
[facility] [address] [place], [place], [address] Phone: [phone]   Fax:  [phone]  Physical Therapy Treatment  Patient Details  Name: Kim Kim MRN: [id number] Date of Birth: [birth year] Referring Provider (PT): Nyoka Cowden, MD   Encounter Date: 09/28/2021   PT End of Session - 09/28/21 1528     Visit Number 16    Number of Visits 21    Date for PT Re-Evaluation 10/21/21    Authorization Type BCBS    PT Start Time 16    PT Stop Time 1548    PT Time Calculation (min) 43 min    Equipment Utilized During Treatment Other (comment);Gait belt    Activity Tolerance Patient tolerated treatment well    Behavior During Therapy WFL for tasks assessed/performed             Past Medical History:  Diagnosis Date   Allergy    seasonal allergies   Anemia    hx of   Carotid atherosclerosis    Epilepsy ([place])    Gastric ulcer    hx of   GERD (gastroesophageal reflux disease)    on meds   Growth hormone deficiency ([place])    Hiatal hernia    Hiatal hernia    Hypothyroidism    hx of Hashimoto thyroiditis   Pituitary adenoma ([place])    hx of   Seizures ([place])    temporal lobe   Thyroid disease    on meds   Vitamin D deficiency     Past Surgical History:  Procedure Laterality Date   COLONOSCOPY  12/10/2015   [place] Dr Meredith Leeds   ESOPHAGOGASTRODUODENOSCOPY  06/03/2019   Dr Celso Sickle, [place] EXTRACTION      There were no vitals filed for this visit.   Subjective Assessment - 09/28/21 1520     Subjective Did water aerobics yesterday and my knee is a little soar, may be the weather"                                          PT Short Term Goals - 08/17/21 0854       PT SHORT TERM GOAL #1   Title pt will be independent with land based HEP for improved ROM, pain, strength, and function.    Status Achieved      PT SHORT TERM GOAL #2   Title Pt will  demo improved R knee extension AROM to 0 deg for improved stiffness and mobility.    Baseline R knee extension 0 deg, reports less stiffness    Status Achieved      PT SHORT TERM GOAL #3   Title Pt will demo improved hip extension strength to 4/5 MMT bilat for improved mobility and tolerance with activities.    Status Achieved      PT SHORT TERM GOAL #4   Title Pt will tolerate aquatic exercises well without adverse effects for improved tolerance to activity.    Status Achieved               PT Long Term Goals - 09/05/21 1317       PT LONG TERM GOAL #1   Title Pt will perform a reciprocal gait on stairs without significant R knee pain.    Baseline the worst is still inner thighs    Status On-going  Target Date 10/21/21      PT LONG TERM GOAL #2   Title Pt will report at least a 50% reduction in pain with activity.    Status Achieved      PT LONG TERM GOAL #3   Title Pt will be independent with aquatic program and be able to perform aquatic exercises without adverse effects to establish an aquatic program for improved strength, pain, and tolerance to activity.    Baseline is doing the exercises but requires further progression    Target Date 10/21/21      PT LONG TERM GOAL #4   Title Pt will be able to perform walking program without adverse effects.    Baseline 2 miles depending on terraine, plan to try 3 miles    Status On-going    Target Date 10/21/21      PT LONG TERM GOAL #5   Title Pt will demo good understanding of appropriate workout activities and be able to perform workout activities without significant pain.    Baseline requires progression    Status On-going    Target Date 10/21/21      PT LONG TERM GOAL #6   Title Pt will demo 5/5 R hip flex and abd strength and at least 4+/5 hip extension strength for improved tolerance with and performance of functional mobility and workout activities.    Baseline see flowsheet    Status On-going    Target Date  10/21/21            Pt seen for aquatic therapy today.  Treatment took place in water 3.25-4.8 ft in depth at the [facility] pool. Temp of water was 94.  Pt entered/exited the pool via stairs step to pattern independently with bilat rail.     Warm up: forward, backward and side stepping/walking cues for increased step length, increased speed, hand placement to increase resistance.     Standing Stretching -Hip flex  and quad stretch using noodle at wall 3 x 30 sec hold -Gastroc and hamstring "runners stretch" on  3 x 30 sec hold -LB stretch pike position at wall 3 x 30 seconds   Proximal strengthening Long leg kicks forward x10 Long leg kicks backwards x10 Long leg hip flex then Add x10 Long leg hip ext then Abd x10  -cues for tightened core upright position.   Core/balance Pilates Lat pull down in modified plank position 2x10 yellow noodle. Planks Swimming planks Reverse planks      Pt requires buoyancy for support and to offload joints with strengthening exercises. Viscosity of the water is needed for resistance of strengthening; water current perturbations provides challenge to standing balance unsupported, requiring increased core activation.       Plan - 09/28/21 1851     Clinical Impression Statement Slight pain in right knee reported.  Worked on proximal strength. Begun reviewing long term HEP.  Discussed exiting from PT in next week or two to indep HEP completion.    Clinical Decision Making Low    Rehab Potential Good    PT Frequency 2x / week    PT Duration 6 weeks    PT Treatment/Interventions Aquatic Therapy;ADLs/Self Care Home Management;Cryotherapy;Electrical Stimulation;DME Instruction;Ultrasound;Moist Heat;Gait training;Stair training;Functional mobility training;Therapeutic activities;Therapeutic exercise;Balance training;Neuromuscular re-education;Manual techniques;Patient/family education;Passive range of motion;Dry needling;Taping    PT  Next Visit Plan core, gluts, stairs/steps in pool, balance with core/hip awareness    PT Home Exercise Plan Access Code: [email or web address] aquatics added.  Patient will benefit from skilled therapeutic intervention in order to improve the following deficits and impairments:  Decreased activity tolerance, Decreased range of motion, Decreased strength, Hypomobility, Decreased mobility, Pain  Visit Diagnosis: Abnormal posture  Chronic pain of right knee  Difficulty in walking, not elsewhere classified  Muscle weakness (generalized)     Problem List Patient Active Problem List   Diagnosis Date Noted   Thoracic back pain 05/19/2020   Prediabetes 01/22/2020   Weight gain 01/22/2020   Osteopenia 01/22/2020   Osteoarthritis 07/17/2019   Family history of heart disease 12/11/2018   Atypical chest pain 12/11/2018   B12 deficiency 10/16/2017   Elevated BP without diagnosis of hypertension 10/01/2017   Osteoporosis 10/01/2017   Uterine fibroid 07/31/2017   History of cleft palate 11/22/2016   Velopharyngeal insufficiency (VPI), 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   Normocytic anemia 07/31/2016   Screen for colon cancer 07/31/2016   Iron deficiency 04/27/2016   Hypothyroidism 12/17/2012   Insulin resistance 12/17/2012   Auras 10/15/2012   Hashimoto's thyroiditis 07/24/2012   Hyperlipidemia 07/24/2012   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   Basal metabolism function study abnormality 03/07/2012   Pituitary microadenoma ([place]) 01/05/2012   Dyspnea 07/09/2011   Pulmonary nodules 07/09/2011   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

## 2021-09-29 DIAGNOSIS — H02729 Madarosis of unspecified eye, unspecified eyelid and periocular area: Secondary | ICD-10-CM | POA: Diagnosis not present

## 2021-09-29 DIAGNOSIS — L84 Corns and callosities: Secondary | ICD-10-CM | POA: Diagnosis not present

## 2021-09-29 DIAGNOSIS — L245 Irritant contact dermatitis due to other chemical products: Secondary | ICD-10-CM | POA: Diagnosis not present

## 2021-09-29 NOTE — Patient Instructions (Signed)
Below is our plan:  We will continue Lamictal 100mg  twice daily.   Please make sure you are staying well hydrated. I recommend 50-60 ounces daily. Well balanced diet and regular exercise encouraged. Consistent sleep schedule with 6-8 hours recommended.   Please continue follow up with care team as directed.   Follow up with me in 1 year   You may receive a survey regarding today's visit. I encourage you to leave honest feed back as I do use this information to improve patient care. Thank you for seeing me today!

## 2021-09-29 NOTE — Progress Notes (Signed)
Chief Complaint  Patient presents with   Follow-up    Rm 1, alone. Here for yearly TLE. Pt reports doing well.     HISTORY OF PRESENT ILLNESS:  10/03/21 ALL:  Kim Kim is a 65 y.o. female here today for follow up for temporal lope epilepsy. She continues Lamictal (brand) 100mg  BID. She continues to do well. No deja vu episodes. No auras. No unusual dreams. She is tolerating meds well. She has only taken Brand Lamictal in the past. She reports mood changes on topiramate.   HISTORY (copied from Dr Gladstone Lighter previous note)  UPDATE (12/30/19, [facility]): Since last visit, doing well. Some reports of memory loss. Some stress. Also asking about intermittent eye pressure issues when laying face down with massages. No alleviating or aggravating factors. Tolerating lamictal 100mg  twice a day. Mild spells have resolved.     PRIOR HPI: 65 year old female here for evaluation of temporal procedures.   In patient's 33s or 44s she was having recurrent dream, feeling like she was a little girl a swing in an old house.  Patient was having intermittent episodes of this strange feeling in her head which she had a hard time describing.  Sometimes she would feel lightheadedness and faint.  In her 80s she had an episode at home where she almost passed out.  In 2007 she had an episode where apparently she turned pale in color and almost fainted.  No dj vu sensations.  She had 1 out of body sensation in high school.   In 2013 patient went to see a neurologist, had EEG which showed some left temporal lobe dysfunction but no clear epileptiform discharges.  Patient was started on topiramate but this caused personality changes and side effects.  She saw another neurologist who started patient on lamotrigine and this seemed to help suppress these episodes.  Patient did well for many years.  She had gone from having 2-3 episodes a month down to no episodes.   In 2020 patient moved from [place] to [place] and  was under increased stress during Covid pandemic and relocation.  In the last few months she has noticed some mild strain sensations in her head which remind her of her prior spells.  She feels a lightheadedness and strangeness in her head.  No convulsions or loss of consciousness.  Patient has been stable on lamotrigine 75 in the morning and 100 at night.  Now patient having approximately 2-3 episodes per week.  Episodes can last a few minutes at a time.   No prior head trauma, meningitis or encephalitis.  No family history of seizures.   REVIEW OF SYSTEMS: Out of a complete 14 system review of symptoms, the patient complains only of the following symptoms, memory loss and all other reviewed systems are negative.   ALLERGIES: No Known Allergies   HOME MEDICATIONS: Outpatient Medications Prior to Visit  Medication Sig Dispense Refill   arginine 500 MG tablet Take 1 tablet (500 mg total) by mouth daily. 60 tablet 2   COVID-19 mRNA bivalent vaccine, Pfizer, (PFIZER COVID-19 VAC BIVALENT) injection Inject into the muscle. 0.3 mL 0   desonide (DESOWEN) 0.05 % cream Apply 1 application topically as needed.     estradiol (ESTRACE) 0.1 MG/GM vaginal cream Place vaginally.     fluticasone (FLONASE) 50 MCG/ACT nasal spray Place into the nose daily as needed.     influenza vac split quadrivalent PF (FLUARIX) 0.5 ML injection Inject into the muscle. 0.5 mL 0  levothyroxine (SYNTHROID) 50 MCG tablet Take 50 mcg by mouth daily before breakfast. 75 Mcg every Sunday     omeprazole (PRILOSEC) 40 MG capsule Take 1 capsule (40 mg total) by mouth in the morning and at bedtime. Dispense as brand name Prilosec per patient request. 60 capsule 3   rosuvastatin (CRESTOR) 10 MG tablet Take 10 mg by mouth at bedtime.     LAMICTAL 100 MG tablet Take 1 tablet (100 mg total) by mouth 2 (two) times daily. 180 tablet 4   No facility-administered medications prior to visit.     PAST MEDICAL HISTORY: Past Medical  History:  Diagnosis Date   Allergy    seasonal allergies   Anemia    hx of   Carotid atherosclerosis    Epilepsy ([place])    Gastric ulcer    hx of   GERD (gastroesophageal reflux disease)    on meds   Growth hormone deficiency ([place])    Hiatal hernia    Hiatal hernia    Hypothyroidism    hx of Hashimoto thyroiditis   Pituitary adenoma ([place])    hx of   Seizures ([place])    temporal lobe   Thyroid disease    on meds   Vitamin D deficiency      PAST SURGICAL HISTORY: Past Surgical History:  Procedure Laterality Date   COLONOSCOPY  12/10/2015   [place] Dr Meredith Leeds   ESOPHAGOGASTRODUODENOSCOPY  06/03/2019   Dr Celso Sickle, [place]   WISDOM TOOTH EXTRACTION       FAMILY HISTORY: Family History  Problem Relation Age of Onset   Hypertension Mother    Heart disease Mother    Diabetes Mother    Thyroid disease Mother        hypo   Thyroid nodules Mother    Hyperlipidemia Father    Heart disease Father    Hypertension Father    Esophageal cancer Neg Hx    Colon cancer Neg Hx    Colon polyps Neg Hx    Stomach cancer Neg Hx    Rectal cancer Neg Hx      SOCIAL HISTORY: Social History   Socioeconomic History   Marital status: Married    Spouse name: Kim Kim   Number of children: 3   Years of education: Not on file   Highest education level: Associate degree: occupational, Hotel manager, or vocational program  Occupational History    Comment: NA  Tobacco Use   Smoking status: Never   Smokeless tobacco: Never  Vaping Use   Vaping Use: Never used  Substance and Sexual Activity   Alcohol use: Yes    Comment: socially   Drug use: Never   Sexual activity: Yes    Birth control/protection: Post-menopausal  Other Topics Concern   Not on file  Social History Narrative   She recently moved from [place]. 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   Caffeine- coffee maybe 1 c, some tea   Social Determinants of Health   Financial Resource Strain:  Not on file  Food Insecurity: Not on file  Transportation Needs: Not on file  Physical Activity: Not on file  Stress: Not on file  Social Connections: Not on file  Intimate Partner Violence: Not on file     PHYSICAL EXAM  Vitals:   10/03/21 0835  BP: 108/70  Pulse: 73  Weight: 170 lb 8 oz (77.3 kg)  Height: 5\' 4"  (1.626 m)   Body mass index  is 29.27 kg/m.  Generalized: Well developed, in no acute distress  Neurological examination  Mentation: Alert oriented to time, place, history taking. Follows all commands speech and language fluent Cranial nerve II-XII: Pupils were equal round reactive to light. Extraocular movements were full, visual field were full on confrontational test. Facial sensation and strength were normal. Uvula tongue midline. Head turning and shoulder shrug  were normal and symmetric. Motor: The motor testing reveals 5 over 5 strength of all 4 extremities. Good symmetric motor tone is noted throughout.  Sensory: Sensory testing is intact to soft touch on all 4 extremities. No evidence of extinction is noted.  Coordination: Cerebellar testing reveals good finger-nose-finger and heel-to-shin bilaterally.  Gait and station: Gait is normal.    DIAGNOSTIC DATA (LABS, IMAGING, TESTING) - I reviewed patient records, labs, notes, testing and imaging myself where available.  Lab Results  Component Value Date   WBC 7.1 09/29/2020   HGB 12.0 09/29/2020   HCT 36.1 09/29/2020   MCV 80.8 09/29/2020   PLT 242.0 09/29/2020      Component Value Date/Time   NA 140 07/12/2021 0948   K 4.6 07/12/2021 0948   CL 98 07/12/2021 0948   CO2 26 07/12/2021 0948   GLUCOSE 90 07/12/2021 0948   BUN 16 07/12/2021 0948   CREATININE 0.99 07/12/2021 0948   CALCIUM 9.8 07/12/2021 0948   PROT 7.9 07/12/2021 0948   ALBUMIN 5.1 (H) 07/12/2021 0948   AST 26 07/12/2021 0948   ALT 16 07/12/2021 0948   ALKPHOS 119 07/12/2021 0948   BILITOT 0.4 07/12/2021 0948   No results found  for: CHOL, HDL, LDLCALC, LDLDIRECT, TRIG, CHOLHDL Lab Results  Component Value Date   HGBA1C 6.1 09/06/2020   Lab Results  Component Value Date   [id number] 093 07/12/2021   Lab Results  Component Value Date   TSH 2.27 09/06/2020    No flowsheet data found.   No flowsheet data found.   ASSESSMENT AND PLAN  65 y.o. year old female  has a past medical history of Allergy, Anemia, Carotid atherosclerosis, Epilepsy ([place]), Gastric ulcer, GERD (gastroesophageal reflux disease), Growth hormone deficiency ([place]), Hiatal hernia, Hiatal hernia, Hypothyroidism, Pituitary adenoma ([place]), Seizures ([place]), Thyroid disease, and Vitamin D deficiency. 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 She denies seizure like events. She will continue Lamictal (brand name medically necessary) 100mg  BID. She will follow up as scheduled with Dr Ronnald Ramp to establish care. She will continue healthy lifestyle habits. Follow up with neurology in 1 year.    No orders of the defined types were placed in this encounter.    Meds ordered this encounter  Medications   LAMICTAL 100 MG tablet    Sig: Take 1 tablet (100 mg total) by mouth 2 (two) times daily.    Dispense:  180 tablet    Refill:  4    Order Specific Question:   Supervising Provider    Answer:   Melvenia Beam [[id number]]       Debbora Presto, MSN, FNP-C 10/03/2021, 9:07 AM  [facility] [address] [place],  [address] [phone]

## 2021-10-03 ENCOUNTER — Encounter: Payer: Self-pay | Admitting: Family Medicine

## 2021-10-03 ENCOUNTER — Ambulatory Visit (INDEPENDENT_AMBULATORY_CARE_PROVIDER_SITE_OTHER): Payer: BC Managed Care – PPO | Admitting: Family Medicine

## 2021-10-03 VITALS — BP 108/70 | HR 73 | Ht 64.0 in | Wt 170.5 lb

## 2021-10-03 DIAGNOSIS — G40109 Localization-related (focal) (partial) symptomatic epilepsy and epileptic syndromes with simple partial seizures, not intractable, without status epilepticus: Secondary | ICD-10-CM

## 2021-10-03 MED ORDER — LAMICTAL 100 MG PO TABS: 100.0000 mg | ORAL_TABLET | Freq: Two times a day (BID) | ORAL | 4 refills | Status: DC

## 2021-10-04 ENCOUNTER — Ambulatory Visit (HOSPITAL_BASED_OUTPATIENT_CLINIC_OR_DEPARTMENT_OTHER): Payer: BC Managed Care – PPO | Attending: Family Medicine | Admitting: Physical Therapy

## 2021-10-04 ENCOUNTER — Other Ambulatory Visit: Payer: Self-pay

## 2021-10-04 ENCOUNTER — Encounter (HOSPITAL_BASED_OUTPATIENT_CLINIC_OR_DEPARTMENT_OTHER): Payer: Self-pay | Admitting: Physical Therapy

## 2021-10-04 DIAGNOSIS — R293 Abnormal posture: Secondary | ICD-10-CM | POA: Diagnosis not present

## 2021-10-04 DIAGNOSIS — M25561 Pain in right knee: Secondary | ICD-10-CM | POA: Insufficient documentation

## 2021-10-04 DIAGNOSIS — M6281 Muscle weakness (generalized): Secondary | ICD-10-CM | POA: Insufficient documentation

## 2021-10-04 DIAGNOSIS — R262 Difficulty in walking, not elsewhere classified: Secondary | ICD-10-CM | POA: Diagnosis present

## 2021-10-04 DIAGNOSIS — G8929 Other chronic pain: Secondary | ICD-10-CM | POA: Diagnosis present

## 2021-10-04 NOTE — Therapy (Signed)
[facility] [address] [place], [place], [address] Phone: [phone]   Fax:  [phone]  Physical Therapy Treatment  Patient Details  Name: Kim Kim MRN: [id number] Date of Birth: 12-05-1955 Referring Provider (PT): Nyoka Cowden, MD   Encounter Date: 10/04/2021   PT End of Session - 10/04/21 0958     Visit Number 17    Number of Visits 21    Date for PT Re-Evaluation 10/21/21    Authorization Type BCBS    PT Start Time [phone]    PT Stop Time 65    PT Time Calculation (min) 42 min    Equipment Utilized During Treatment Other (comment);Gait belt    Activity Tolerance Patient tolerated treatment well    Behavior During Therapy WFL for tasks assessed/performed             Past Medical History:  Diagnosis Date   Allergy    seasonal allergies   Anemia    hx of   Carotid atherosclerosis    Epilepsy ([place])    Gastric ulcer    hx of   GERD (gastroesophageal reflux disease)    on meds   Growth hormone deficiency ([place])    Hiatal hernia    Hiatal hernia    Hypothyroidism    hx of Hashimoto thyroiditis   Pituitary adenoma ([place])    hx of   Seizures ([place])    temporal lobe   Thyroid disease    on meds   Vitamin D deficiency     Past Surgical History:  Procedure Laterality Date   COLONOSCOPY  12/10/2015   [place] Dr Meredith Leeds   ESOPHAGOGASTRODUODENOSCOPY  06/03/2019   Dr Celso Sickle, [place] EXTRACTION      There were no vitals filed for this visit.   Subjective Assessment - 10/04/21 0958     Subjective I think I'm almost done with PT.  I just need to get motiated to do this on my own"                                          PT Short Term Goals - 08/17/21 0854       PT SHORT TERM GOAL #1   Title pt will be independent with land based HEP for improved ROM, pain, strength, and function.    Status Achieved      PT SHORT TERM GOAL #2   Title Pt  will demo improved R knee extension AROM to 0 deg for improved stiffness and mobility.    Baseline R knee extension 0 deg, reports less stiffness    Status Achieved      PT SHORT TERM GOAL #3   Title Pt will demo improved hip extension strength to 4/5 MMT bilat for improved mobility and tolerance with activities.    Status Achieved      PT SHORT TERM GOAL #4   Title Pt will tolerate aquatic exercises well without adverse effects for improved tolerance to activity.    Status Achieved               PT Long Term Goals - 09/05/21 1317       PT LONG TERM GOAL #1   Title Pt will perform a reciprocal gait on stairs without significant R knee pain.    Baseline the worst is still inner thighs  Status On-going    Target Date 10/21/21      PT LONG TERM GOAL #2   Title Pt will report at least a 50% reduction in pain with activity.    Status Achieved      PT LONG TERM GOAL #3   Title Pt will be independent with aquatic program and be able to perform aquatic exercises without adverse effects to establish an aquatic program for improved strength, pain, and tolerance to activity.    Baseline is doing the exercises but requires further progression    Target Date 10/21/21      PT LONG TERM GOAL #4   Title Pt will be able to perform walking program without adverse effects.    Baseline 2 miles depending on terraine, plan to try 3 miles    Status On-going    Target Date 10/21/21      PT LONG TERM GOAL #5   Title Pt will demo good understanding of appropriate workout activities and be able to perform workout activities without significant pain.    Baseline requires progression    Status On-going    Target Date 10/21/21      PT LONG TERM GOAL #6   Title Pt will demo 5/5 R hip flex and abd strength and at least 4+/5 hip extension strength for improved tolerance with and performance of functional mobility and workout activities.    Baseline see flowsheet    Status On-going    Target  Date 10/21/21           Pt seen for aquatic therapy today.  Treatment took place in water 3.25-4.8 ft in depth at the [facility] pool. Temp of water was 94.  Pt entered/exited the pool via stairs step to pattern independently with bilat rail.     Warm up: forward, backward and side stepping/walking cues for increased step length, increased speed, hand placement to increase resistance.     Standing Stretching -Hip flex  and quad stretch using noodle at wall 3 x 30 sec hold -Gastroc and hamstring "runners stretch" on  3 x 30 sec hold -LB stretch pike position at wall 3 x 30 seconds   Proximal strengthening: added 3 lb ankle weights Long leg kicks forward x15; quick x 15 Long leg kicks backwards x15 slow; x 15 quick  Long leg hip flex then Add x10 Long leg hip ext then Abd x10  -cues for tightened core upright position.   Core/balance Side to side pendulums  Seated Flutter kicking at knee 2 x 30 reps -flutter  kcicking at hip 2 x 30       Pt requires buoyancy for support and to offload joints with strengthening exercises. Viscosity of the water is needed for resistance of strengthening; water current perturbations provides challenge to standing balance unsupported, requiring increased core activation.        Plan - 10/04/21 1006     Clinical Impression Statement Pt in agreement with DC planning.  Added 3 lb weights today as pt strength continues to improve. No complaints of knee pain, just stiffness. Slight discomfort with palpation right lat knee. Pt tolerates session with added weights well.  Added to written and laminated HEP. One more session needed to address all goals and evaluate increased addition of weights to program.  Will dc next visit.    Personal Factors and Comorbidities Comorbidity 1    Comorbidities Pt reports having low end osteopenia    Examination-Activity Limitations Squat;Stairs  Stability/Clinical Decision Making Stable/Uncomplicated     Clinical Decision Making Low    Rehab Potential Good    PT Frequency 2x / week    PT Duration 6 weeks    PT Treatment/Interventions Aquatic Therapy;ADLs/Self Care Home Management;Cryotherapy;Electrical Stimulation;DME Instruction;Ultrasound;Moist Heat;Gait training;Stair training;Functional mobility training;Therapeutic activities;Therapeutic exercise;Balance training;Neuromuscular re-education;Manual techniques;Patient/family education;Passive range of motion;Dry needling;Taping    PT Home Exercise Plan Modified/added to written and lamenated HEP.  DC             Patient will benefit from skilled therapeutic intervention in order to improve the following deficits and impairments:  Decreased activity tolerance, Decreased range of motion, Decreased strength, Hypomobility, Decreased mobility, Pain  Visit Diagnosis: Abnormal posture  Chronic pain of right knee  Difficulty in walking, not elsewhere classified  Muscle weakness (generalized)     Problem List Patient Active Problem List   Diagnosis Date Noted   Thoracic back pain 05/19/2020   Prediabetes 01/22/2020   Weight gain 01/22/2020   Osteopenia 01/22/2020   Osteoarthritis 07/17/2019   Family history of heart disease 12/11/2018   Atypical chest pain 12/11/2018   B12 deficiency 10/16/2017   Elevated BP without diagnosis of hypertension 10/01/2017   Osteoporosis 10/01/2017   Uterine fibroid 07/31/2017   History of cleft palate 11/22/2016   Velopharyngeal insufficiency (VPI), 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   Normocytic anemia 07/31/2016   Screen for colon cancer 07/31/2016   Iron deficiency 04/27/2016   Hypothyroidism 12/17/2012   Insulin resistance 12/17/2012   Auras 10/15/2012   Hashimoto's thyroiditis 07/24/2012   Hyperlipidemia 07/24/2012   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   Basal metabolism function study abnormality 03/07/2012   Pituitary microadenoma ([place]) 01/05/2012   Dyspnea 07/09/2011   Pulmonary nodules 07/09/2011    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

## 2021-10-07 ENCOUNTER — Other Ambulatory Visit: Payer: Self-pay

## 2021-10-07 ENCOUNTER — Ambulatory Visit (HOSPITAL_BASED_OUTPATIENT_CLINIC_OR_DEPARTMENT_OTHER): Payer: BC Managed Care – PPO | Admitting: Physical Therapy

## 2021-10-07 DIAGNOSIS — R293 Abnormal posture: Secondary | ICD-10-CM

## 2021-10-07 DIAGNOSIS — G8929 Other chronic pain: Secondary | ICD-10-CM

## 2021-10-07 DIAGNOSIS — M25561 Pain in right knee: Secondary | ICD-10-CM

## 2021-10-07 DIAGNOSIS — R262 Difficulty in walking, not elsewhere classified: Secondary | ICD-10-CM

## 2021-10-09 NOTE — Therapy (Signed)
[facility] [address] [place], [place], [address] Phone: [phone]   Fax:  [phone]  Physical Therapy Treatment  Patient Details  Name: Kim Kim MRN: [id number] Date of Birth: April 12, 1956 Referring Provider (PT): Nyoka Cowden, MD   Encounter Date: 10/07/2021  Visit number 36 Re-Eval 10/21/21 Start time: 100 Stop time:900 Total time:45  Subjective: I want to make sure I understand all of the exercises" No pain  Past Medical History:  Diagnosis Date   Allergy    seasonal allergies   Anemia    hx of   Carotid atherosclerosis    Epilepsy ([place])    Gastric ulcer    hx of   GERD (gastroesophageal reflux disease)    on meds   Growth hormone deficiency ([place])    Hiatal hernia    Hiatal hernia    Hypothyroidism    hx of Hashimoto thyroiditis   Pituitary adenoma ([place])    hx of   Seizures ([place])    temporal lobe   Thyroid disease    on meds   Vitamin D deficiency     Past Surgical History:  Procedure Laterality Date   COLONOSCOPY  12/10/2015   [place] Dr Meredith Leeds   ESOPHAGOGASTRODUODENOSCOPY  06/03/2019   Dr Celso Sickle, [place] EXTRACTION      There were no vitals filed for this visit.        [facility] PT Assessment - 10/09/21 0001       Strength   Right Hip Flexion 5/5    Right Hip Extension 4+/5    Right Hip ABduction 5/5                                      PT Short Term Goals - 08/17/21 0854       PT SHORT TERM GOAL #1   Title pt will be independent with land based HEP for improved ROM, pain, strength, and function.    Status Achieved      PT SHORT TERM GOAL #2   Title Pt will demo improved R knee extension AROM to 0 deg for improved stiffness and mobility.    Baseline R knee extension 0 deg, reports less stiffness    Status Achieved      PT SHORT TERM GOAL #3   Title Pt will demo improved hip extension strength to 4/5 MMT bilat for  improved mobility and tolerance with activities.    Status Achieved      PT SHORT TERM GOAL #4   Title Pt will tolerate aquatic exercises well without adverse effects for improved tolerance to activity.    Status Achieved               PT Long Term Goals - 10/09/21 2124       PT LONG TERM GOAL #1   Baseline Pt completes with discomfort but reports greatly improved.    Status Achieved      PT LONG TERM GOAL #3   Baseline Pt indep with final HEP    Status Achieved      PT LONG TERM GOAL #4   Baseline Pt reports walking well on flat surfaces avoiding inclines and uneven surfaces when able. Able to amb > 3 miles.    Status Achieved      PT LONG TERM GOAL #5   Baseline Pt genrally without discomfort through  ADL's and daily exercise    Status Achieved      PT LONG TERM GOAL #6   Baseline see flowsheet    Status Achieved           Pt seen for aquatic therapy today.  Treatment took place in water 3.25-4.8 ft in depth at the [facility] pool. Temp of water was 94.  Pt entered/exited the pool via stairs step to pattern independently with bilat rail.     Warm up: forward, backward and side stepping/walking cues for increased step length, increased speed, hand placement to increase resistance.     Standing Stretching Completed indep -Hip flex  and quad stretch using noodle at wall 3 x 30 sec hold -Gastroc and hamstring "runners stretch" on  3 x 30 sec hold -LB stretch pike position at wall 3 x 30 seconds   Proximal strengthening: added 3 lb ankle weights Long leg kicks forward x15; quick x 15 Long leg kicks backwards x15 slow; x 15 quick  Long leg hip flex then Add x10 Long leg hip ext then Abd x10  -cues for tightened core upright position.  Standing exercises Noodle kick down 2 x 10 Kick board lateral rotations 2 x 10 R/L   Core/balance Side to side pendulums Modified plank/plank Lat pull downs  Pt requires buoyancy for support and to offload  joints with strengthening exercises. Viscosity of the water is needed for resistance of strengthening; water current perturbations provides challenge to standing balance unsupported, requiring increased core activation.        Plan - 10/09/21 2120     Clinical Impression Statement Pt advanced with ankle weights with completion of exercise program. She is reinstructed on noodle kick downand resisted latetral core rotation wiht kickboard.  HEP adjusted to better fit pt's need.  She is indep with completion.  Pt has reached her max potential and is ready for dc.    Clinical Decision Making Low    PT Treatment/Interventions Aquatic Therapy;ADLs/Self Care Home Management;Cryotherapy;Electrical Stimulation;DME Instruction;Ultrasound;Moist Heat;Gait training;Stair training;Functional mobility training;Therapeutic activities;Therapeutic exercise;Balance training;Neuromuscular re-education;Manual techniques;Patient/family education;Passive range of motion;Dry needling;Taping            Patient will benefit from skilled therapeutic intervention in order to improve the following deficits and impairments:  Decreased activity tolerance, Decreased range of motion, Decreased strength, Hypomobility, Decreased mobility, Pain  Visit Diagnosis: Abnormal posture  Chronic pain of right knee  Difficulty in walking, not elsewhere classified     Problem List Patient Active Problem List   Diagnosis Date Noted   Thoracic back pain 05/19/2020   Prediabetes 01/22/2020   Weight gain 01/22/2020   Osteopenia 01/22/2020   Osteoarthritis 07/17/2019   Family history of heart disease 12/11/2018   Atypical chest pain 12/11/2018   B12 deficiency 10/16/2017   Elevated BP without diagnosis of hypertension 10/01/2017   Osteoporosis 10/01/2017   Uterine fibroid 07/31/2017   History of cleft palate 11/22/2016   Velopharyngeal insufficiency (VPI), 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   Normocytic anemia 07/31/2016   Screen for  colon cancer 07/31/2016   Iron deficiency 04/27/2016   Hypothyroidism 12/17/2012   Insulin resistance 12/17/2012   Auras 10/15/2012   Hashimoto's thyroiditis 07/24/2012   Hyperlipidemia 07/24/2012   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   Basal metabolism function study abnormality 03/07/2012   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   Dyspnea 07/09/2011   Pulmonary nodules 07/09/2011   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 Patient goals were met. Patient is being discharged due to meeting the stated rehab goals.   Stanton Kidney Tharon Aquas) Narcissus Detwiler MPT  10/09/2021, 9:35 PM  [facility] [address] [place], [place], [address] Phone: [phone]   Fax:  [phone]  Name: Kim Kim MRN: [id number] Date of Birth: 05-Sep-1956

## 2021-10-10 ENCOUNTER — Ambulatory Visit (HOSPITAL_BASED_OUTPATIENT_CLINIC_OR_DEPARTMENT_OTHER): Payer: BC Managed Care – PPO | Admitting: Physical Therapy

## 2021-10-11 ENCOUNTER — Ambulatory Visit: Payer: BC Managed Care – PPO | Admitting: Sports Medicine

## 2021-10-11 ENCOUNTER — Ambulatory Visit (INDEPENDENT_AMBULATORY_CARE_PROVIDER_SITE_OTHER): Payer: BC Managed Care – PPO | Admitting: Sports Medicine

## 2021-10-11 VITALS — BP 132/72 | Ht 64.0 in | Wt 165.0 lb

## 2021-10-11 DIAGNOSIS — M25512 Pain in left shoulder: Secondary | ICD-10-CM

## 2021-10-11 DIAGNOSIS — M1711 Unilateral primary osteoarthritis, right knee: Secondary | ICD-10-CM | POA: Diagnosis not present

## 2021-10-11 DIAGNOSIS — M549 Dorsalgia, unspecified: Secondary | ICD-10-CM

## 2021-10-11 DIAGNOSIS — R293 Abnormal posture: Secondary | ICD-10-CM | POA: Diagnosis not present

## 2021-10-11 DIAGNOSIS — M25511 Pain in right shoulder: Secondary | ICD-10-CM

## 2021-10-11 NOTE — Progress Notes (Signed)
PCP: Pcp, No  Subjective:   HPI: Patient is a 65 y.o. female here for follow-up of upper back pain and right knee OA.  Upper back pain/posterior shoulder pain - patient has dealt with this issue for the past few years.  Over the last 3 years she has been back and forth between [place] and here with moving boxes, in and out of homes, helping care for her father.  She states during this process she noted some pain in the upper back and posterior shoulders.  Her right is worse than the left.  She will get pain with certain twisting motions or trying to put the arms overhead.  She saw Dr. Barbaraann Barthel for this on 09/14/2021, and was thought to have more pain in the trapezius and scapular stabilizers.  She had been sent to physical therapy and has been doing some aquatic therapy based exercises which have been helping to some extent.  She presents today to discuss home exercises and activities that she can do at the gym to help improve this pain and strengthen this.  She denies any redness, swelling or bruising.  She feels like she has some stiffness and restricted range of motion with her UE movement.  Right knee - hx of OA. Has worked very hard with PT in the past, home exercises and wearing her knee sleeve with activity.  She feels like it is at best it has been in the past few years.  Not taking any medication consistently for this.  Does have some mild pain with going up and down steps, although much improved from previously.  No reported swelling today.   BP 132/72    Ht 5\' 4"  (1.626 m)    Wt 165 lb (74.8 kg)    BMI 28.32 kg/m   [facility] 09/14/2021  Frequency of aerobic exercise (# of days/week) 4  Average time in minutes 60  Frequency of strengthening activities (# of days/week) 4    No flowsheet data found.      Objective:  Physical Exam:  Gen: Well-appearing, in no acute distress; non-toxic CV: Regular Rate. Well-perfused. Warm.  Resp: Breathing unlabored on  room air; no wheezing. Psych: Fluid speech in conversation; appropriate affect; normal thought process Neuro: Sensation intact throughout. No gross coordination deficits.  MSK:  - Right shoulder: No swelling, ecchymosis.  No gross deformity.  Full range of motion of the shoulder, although there is compensation of the scapula when moving with abduction above 100 degrees.  Strength 5/5 with empty can, resisted internal/external rotation.  Neurovascular intact distally.  - Upper back: + TTP and associated hypertonicity of the musculature surrounding the entire medial aspect of the scapula. The right scapula slightly more depressed and internally rotated with some abnormal motion compared to the left scapula with overhead motion of the arms. + TTP and hypertonicity of b/l trapezius muscles. Protracted shoulders b/l. Mild pain with resisted latissimus dorsi movement.   RT hip abduction moderately weak   Assessment & Plan:  1. Upper cross syndrome 2. Abnormal posture with b/l protracted shoulders 3. Hypertonicity of trapezius mm and medial scapular muscle attachments 4. Right knee pain, chronic - improving.  5. Hip abductor weakness, right hip  Patient symptoms are stemming from her abnormal posture with protracted shoulders and forward mutation of the head.  Does have an element of upper crossed syndrome.  I feel these symptoms will greatly improve with home exercises and some physical therapy.  Patient has worked before  with [facility] physical therapy, we will send her to [facility] for an individual session to work and address on her insufficiencies.  Home exercises provided and demonstrated including I&T stretches, scapular retraction, and hip abductor strengthening was provided.  In terms of the right knee, continue to work on quad and hip strengthening, she may wear her knee sleeve with activity, but may take it off otherwise.  We will see how she responds to her PT and home exercises, and follow-up  in about 3 months.  Future modalities may include trigger point injections around the medial scapula, +/- dry needling. She may call or return if any issues arise prior to this.    Elba Barman, DO PGY-4, Sports Medicine Fellow [place]  I observed and examined the patient with the [facility] resident and agree with assessment and plan.  Note reviewed and modified by me. Ila Mcgill, MD

## 2021-10-12 ENCOUNTER — Ambulatory Visit (HOSPITAL_BASED_OUTPATIENT_CLINIC_OR_DEPARTMENT_OTHER): Payer: BC Managed Care – PPO | Admitting: Physical Therapy

## 2021-10-14 NOTE — Addendum Note (Signed)
Addended by: Cyd Silence on: 10/14/2021 02:54 PM   Modules accepted: Orders

## 2021-10-17 ENCOUNTER — Ambulatory Visit (HOSPITAL_BASED_OUTPATIENT_CLINIC_OR_DEPARTMENT_OTHER): Payer: BC Managed Care – PPO | Admitting: Physical Therapy

## 2021-10-25 DIAGNOSIS — J45909 Unspecified asthma, uncomplicated: Secondary | ICD-10-CM | POA: Diagnosis not present

## 2021-10-25 DIAGNOSIS — L299 Pruritus, unspecified: Secondary | ICD-10-CM | POA: Diagnosis not present

## 2021-10-25 DIAGNOSIS — Z6828 Body mass index (BMI) 28.0-28.9, adult: Secondary | ICD-10-CM | POA: Diagnosis not present

## 2021-10-25 DIAGNOSIS — E782 Mixed hyperlipidemia: Secondary | ICD-10-CM | POA: Diagnosis not present

## 2021-10-26 DIAGNOSIS — E782 Mixed hyperlipidemia: Secondary | ICD-10-CM | POA: Diagnosis not present

## 2021-10-26 DIAGNOSIS — Z6828 Body mass index (BMI) 28.0-28.9, adult: Secondary | ICD-10-CM | POA: Diagnosis not present

## 2021-10-28 ENCOUNTER — Encounter: Payer: Self-pay | Admitting: Cardiology

## 2021-10-28 ENCOUNTER — Ambulatory Visit (HOSPITAL_BASED_OUTPATIENT_CLINIC_OR_DEPARTMENT_OTHER): Payer: BC Managed Care – PPO | Admitting: Physical Therapy

## 2021-10-28 DIAGNOSIS — I209 Angina pectoris, unspecified: Secondary | ICD-10-CM

## 2021-10-28 DIAGNOSIS — E782 Mixed hyperlipidemia: Secondary | ICD-10-CM

## 2021-10-28 DIAGNOSIS — R739 Hyperglycemia, unspecified: Secondary | ICD-10-CM

## 2021-10-28 NOTE — Telephone Encounter (Signed)
ICD-10-CM   1. Angina pectoris with normal coronary arteriogram ([place])  I20.9 Ambulatory referral to [facility]    2. Hyperglycemia  R73.9 Ambulatory referral to [facility]    3. Mixed hyperlipidemia  E78.2 Ambulatory referral to [place], MD, [facility] 10/28/2021, 1:13 PM Office: [phone] Fax: [phone] Pager: [phone]

## 2021-11-01 ENCOUNTER — Ambulatory Visit (INDEPENDENT_AMBULATORY_CARE_PROVIDER_SITE_OTHER): Payer: BC Managed Care – PPO | Admitting: Family Medicine

## 2021-11-01 ENCOUNTER — Other Ambulatory Visit: Payer: Self-pay

## 2021-11-01 ENCOUNTER — Encounter (INDEPENDENT_AMBULATORY_CARE_PROVIDER_SITE_OTHER): Payer: Self-pay | Admitting: Family Medicine

## 2021-11-01 VITALS — BP 101/68 | HR 86 | Temp 97.9°F | Ht 64.0 in | Wt 167.0 lb

## 2021-11-01 DIAGNOSIS — F3289 Other specified depressive episodes: Secondary | ICD-10-CM | POA: Diagnosis not present

## 2021-11-01 DIAGNOSIS — E669 Obesity, unspecified: Secondary | ICD-10-CM

## 2021-11-01 DIAGNOSIS — Z6830 Body mass index (BMI) 30.0-30.9, adult: Secondary | ICD-10-CM | POA: Diagnosis not present

## 2021-11-02 ENCOUNTER — Ambulatory Visit: Payer: BC Managed Care – PPO | Attending: Sports Medicine | Admitting: Physical Therapy

## 2021-11-02 ENCOUNTER — Encounter: Payer: Self-pay | Admitting: Physical Therapy

## 2021-11-02 DIAGNOSIS — M25511 Pain in right shoulder: Secondary | ICD-10-CM | POA: Insufficient documentation

## 2021-11-02 DIAGNOSIS — G8929 Other chronic pain: Secondary | ICD-10-CM | POA: Insufficient documentation

## 2021-11-02 DIAGNOSIS — M25512 Pain in left shoulder: Secondary | ICD-10-CM | POA: Diagnosis not present

## 2021-11-02 DIAGNOSIS — M6281 Muscle weakness (generalized): Secondary | ICD-10-CM | POA: Diagnosis not present

## 2021-11-02 DIAGNOSIS — M549 Dorsalgia, unspecified: Secondary | ICD-10-CM | POA: Insufficient documentation

## 2021-11-02 DIAGNOSIS — R293 Abnormal posture: Secondary | ICD-10-CM | POA: Diagnosis not present

## 2021-11-02 DIAGNOSIS — M546 Pain in thoracic spine: Secondary | ICD-10-CM | POA: Insufficient documentation

## 2021-11-02 NOTE — Therapy (Signed)
OUTPATIENT PHYSICAL THERAPY SHOULDER EVALUATION   Patient Name: Kim Kim MRN: [id number] DOB:1956-05-27, 66 y.o., female Today's Date: 11/02/2021   PT End of Session - 11/02/21 0812     Visit Number 1    Number of Visits 12    Date for PT Re-Evaluation 12/14/21    Authorization Type BCBS    PT Start Time 0805    PT Stop Time 0855    PT Time Calculation (min) 50 min    Activity Tolerance Patient tolerated treatment well    Behavior During Therapy WFL for tasks assessed/performed             Past Medical History:  Diagnosis Date   Allergy    seasonal allergies   Anemia    hx of   Carotid atherosclerosis    Epilepsy ([place])    Gastric ulcer    hx of   GERD (gastroesophageal reflux disease)    on meds   Growth hormone deficiency ([place])    Hiatal hernia    Hiatal hernia    Hypothyroidism    hx of Hashimoto thyroiditis   Pituitary adenoma ([place])    hx of   Seizures ([place])    temporal lobe   Thyroid disease    on meds   Vitamin D deficiency    Past Surgical History:  Procedure Laterality Date   COLONOSCOPY  12/10/2015   [place] Dr Meredith Leeds   ESOPHAGOGASTRODUODENOSCOPY  06/03/2019   Dr Celso Sickle, [place] EXTRACTION     Patient Active Problem List   Diagnosis Date Noted   Thoracic back pain 05/19/2020   Prediabetes 01/22/2020   Weight gain 01/22/2020   Osteopenia 01/22/2020   Osteoarthritis 07/17/2019   Family history of heart disease 12/11/2018   Atypical chest pain 12/11/2018   B12 deficiency 10/16/2017   Elevated BP without diagnosis of hypertension 10/01/2017   Osteoporosis 10/01/2017   Uterine fibroid 07/31/2017   History of cleft palate 11/22/2016   Velopharyngeal insufficiency (VPI), 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   Normocytic anemia 07/31/2016   Screen for colon cancer 07/31/2016   Iron deficiency 04/27/2016   Hypothyroidism 12/17/2012   Insulin resistance 12/17/2012   Auras 10/15/2012   Hashimoto's thyroiditis 07/24/2012    Hyperlipidemia 07/24/2012   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   Basal metabolism function study abnormality 03/07/2012   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   Dyspnea 07/09/2011   Pulmonary nodules 07/09/2011   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[place]) 36/62/9476   Dysmetabolic syndrome X 54/65/0354   Growth hormone deficiency ([place]) 03/28/2011   Low HDL (under 40) 03/28/2011   GERD (gastroesophageal reflux disease) 01/01/2009   Obesity 05/19/2008   Vitamin D deficiency 10/07/2007    PCP: Pcp, No  REFERRING PROVIDER: Stefanie Libel, MD  REFERRING DIAG: [phone] (ICD-10-CM) - Bilateral shoulder pain, unspecified chronicity R29.3 (ICD-10-CM) - Posture abnormality M54.9 (ICD-10-CM) - Upper back pain   THERAPY DIAG:  Abnormal posture  Muscle weakness (generalized)  Chronic right shoulder pain  Pain in thoracic spine   ONSET DATE: chronic   SUBJECTIVE:  SUBJECTIVE STATEMENT: Patient is having Rt shoulder and mid back pain. She just finished Pt for her knee at [facility] with good result. She is interested in Pilates but was unable to get in with [facility] at [facility]. She would like to begin Pilates here and then transition to community classes and /or private sessions.  She has some experience a few yrs ago.  She currently does pool exercise 4-5 times per week. She has lost muscle mass since COVID.  She would like to strengthen her back, shoulder and improve her posture.  She has had this pain for several years ("childbirth") off and on and it has gotten worse over the years.  Pain depends on the amount of activity she does.    PERTINENT HISTORY: From Previous provider, Dr. Oneida Alar Patient is a 66 y.o. female here for follow-up of upper back pain and right knee OA.   Upper back pain/posterior  shoulder pain - patient has dealt with this issue for the past few years.  Over the last 3 years she has been back and forth between [place] and here with moving boxes, in and out of homes, helping care for her father.  She states during this process she noted some pain in the upper back and posterior shoulders.  Her right is worse than the left.  She will get pain with certain twisting motions or trying to put the arms overhead.  She saw Dr. Barbaraann Barthel for this on 09/14/2021, and was thought to have more pain in the trapezius and scapular stabilizers.  She had been sent to physical therapy and has been doing some aquatic therapy based exercises which have been helping to some extent.  She presents today to discuss home exercises and activities that she can do at the gym to help improve this pain and strengthen this.  She denies any redness, swelling or bruising.  She feels like she has some stiffness and restricted range of motion with her UE movement.    PAIN:  Are you having pain? No, just sore VAS scale: 4/10 Pain location: Rt  post shoulder Pain orientation: Right and Posterior  PAIN TYPE: soreness Pain description: intermittent and aching  Aggravating factors: moving, rotating  Relieving factors: rest, does not use heat , med  PRECAUTIONS: None  WEIGHT BEARING RESTRICTIONS No  FALLS:  Has patient fallen in last 6 months? No Number of falls: 0  LIVING ENVIRONMENT: Lives with: lives with their family and lives with their spouse Lives in: House/apartment Stairs: No;  Has following equipment at home:  none  OCCUPATION: Retired [employer].  Hobbies include exercise, water aerobics, walking, reading, cooking, needle point   PLOF: Independent  PATIENT GOALS:  Better posture, strengthening back mm , better motion  OBJECTIVE:   DIAGNOSTIC FINDINGS:  None recent   PATIENT SURVEYS:  FOTO 54%, 63%  COGNITION:  Overall cognitive status: Within functional limits for tasks  assessed     SENSATION:  Light touch: Appears intact  Stereognosis: Appears intact  Hot/Cold: Appears intact  Proprioception: Appears intact  POSTURE: Stands with forward head, upper crossed syndrome, rounded back  PALPATION: Tender, painful with gross palpation to bilateral upper back, periscapular area, upper trap.  Pain also in thoracic and lumbar paraspinals with light palpation.    UPPER EXTREMITY AROM/PROM:  A/PROM Right 11/02/2021 Left 11/02/2021  Shoulder flexion [facility] WFL stiff superior shoulder  Shoulder extension    Shoulder abduction 115 125  Shoulder adduction    Shoulder internal rotation  FR to upper lumbar FR to mid T   Shoulder external rotation FR to Rt T1 FR to Lt T1  (Blank rows = not tested)  UPPER EXTREMITY MMT:  MMT Right 11/02/2021 Left 11/02/2021  Shoulder flexion 3/5 3/5  Shoulder extension    Shoulder abduction 3/5 3/5  Shoulder adduction    Middle trapezius 3/5 3+/5  Lower trapezius    Elbow flexion 4+/5 4+/5  Elbow extension 4/5 4/5  Wrist flexion    Wrist extension    Wrist ulnar deviation    Wrist radial deviation    Wrist pronation    Wrist supination    Grip strength (lbs) 20  24  (Blank rows = not tested)  Stiffness in Rt >Lt.  Internal rotation, posterior capsule  T spine hypomobile   TODAY'S TREATMENT:  Intro to PT, HEP, spine mobility, muscle strength for longevity.  POC   PATIENT EDUCATION: Education details: PT/POC, HEP, posture, pilates Person educated: Patient Education method: Explanation Education comprehension: verbalized understanding and needs further education   HOME EXERCISE PROGRAM: Access Code: [email or web address] URL: [email or web address] Date: 11/02/2021 Prepared by: Raeford Razor  Exercises Sidelying Thoracic Rotation with Open Book - 2-3 x daily - 7 x weekly - 2 sets - 10 reps - 10 hold Prone Scapular Retraction - 1-2 x daily - 7 x weekly - 2 sets - 10 reps - 5 hold Prone Scapular Slide with  Shoulder Extension - 1-2 x daily - 7 x weekly - 2 sets - 10 reps - 5 hold   ASSESSMENT:  CLINICAL IMPRESSION: Patient is a 66  y.o. female who was seen today for physical therapy evaluation and treatment for Rt sided thoracic and shoulder pain. Objective impairments include decreased mobility, decreased ROM, decreased strength, hypomobility, increased fascial restrictions, impaired flexibility, impaired UE functional use, postural dysfunction, obesity, and pain. These impairments are limiting patient from cleaning, community activity, meal prep, occupation, laundry, and shopping. Personal factors including Time since onset of injury/illness/exacerbation are also affecting patient's functional outcome. Patient will benefit from skilled PT to address above impairments and improve overall function. She will transition to Pilates but would like to get some re-orientation here at [facility].  She needs to incorporate strengthening in her routine for overall longevity and functional mobility.    REHAB POTENTIAL: Excellent  CLINICAL DECISION MAKING: Stable/uncomplicated  EVALUATION COMPLEXITY: Low   GOALS: Goals reviewed with patient? No  LONG TERM GOALS:   LTG Name Target Date Goal status  1 Pt will be able to show Independence with HEP for strength and  Baseline: 12/14/2021 INITIAL  2 Pt will increase strength in bilateral UEs to 4/5 or better to support upper body activities Baseline: 12/14/2021 INITIAL  3 Pt will be able to notice no more than min increased pain, discomfort with rotational movements, use of UE and trunk Baseline: 12/14/2021 INITIAL  4 Pt will be able to self correct for standing and seated postures without cues and be more aware throughout the day  Baseline: 12/14/2021 INITIAL  5 Pt will improve FOTO score to 70% or better in each body part  Baseline: 54% (S), 63% (T) 12/14/2021 INITIAL             PLAN: PT FREQUENCY: 1-2x/week 1-2x/week PT DURATION: 6 weeks  PLANNED  INTERVENTIONS: Therapeutic exercises, Therapeutic activity, Neuro Muscular re-education, Balance training, Gait training, Patient/Family education, Joint mobilization, Dry Needling, Spinal mobilization, Cryotherapy, Moist heat, Manual therapy, and Pilates based strength  PLAN FOR NEXT SESSION: check HEP  and begin basic Pilates exercises: footwork, supine arms, mermaid  and prone    Kim Kim 11/02/2021, 9:15 AM  Raeford Razor, PT 11/02/21 9:15 AM Phone: [phone] Fax: [phone]

## 2021-11-02 NOTE — Progress Notes (Signed)
Chief Complaint:   OBESITY Kim Kim is here to discuss her progress with her obesity treatment plan along with follow-up of her obesity related diagnoses. Kim Kim is on keeping a food journal and adhering to recommended goals of 1100 calories and 75 grams of protein daily and states she is following her eating plan approximately (unknown)% of the time. Kim Kim states she is at the gym for 60 minutes 4-5 times per week.  Today's visit was #: 6 Starting weight: 166 lbs Starting date: 07/12/2021 Today's weight: 167 lbs Today's date: 11/01/2021 Total lbs lost to date: 0 Total lbs lost since last in-office visit: 0  Interim History: Kim Kim has been traveling over the holidays and she is working on trying to portion control and make smarter choices. She has gained a little weight over Christmas, but this appears to be mostly fluid retention. She is ready to get back on track.  Subjective:   1. Other depression with emotional eating Kim Kim is struggling with sweet cravings and emotional eating behaviors. She is trying to avoid extra medications and she would like to discuss behavior strategies.  Assessment/Plan:   1. Other depression with emotional eating Emotional eating behavior strategies were discussed today to help Kim Kim decrease her emotional/non-hunger eating behaviors. We will follow up in 2 weeks to see how things are working out. Orders and follow up as documented in patient record.   2. Current BMI of 29.7 Kim Kim is currently in the action stage of change. As such, her goal is to continue with weight loss efforts. She has agreed to change to keeping a food journal and adhering to recommended goals of 1100-1200 calories and 75+ grams of protein daily or the [place].   Exercise goals: As is.  Behavioral modification strategies: increasing lean protein intake and meal planning and cooking strategies.  Kim Kim has agreed to follow-up with our clinic in 2 weeks. She was informed of  the importance of frequent follow-up visits to maximize her success with intensive lifestyle modifications for her multiple health conditions.   Objective:   Blood pressure 101/68, pulse 86, temperature 97.9 F (36.6 C), temperature source Oral, height 5\' 4"  (1.626 m), weight 167 lb (75.8 kg), SpO2 97 %. Body mass index is 28.67 kg/m.  General: Cooperative, alert, well developed, in no acute distress. HEENT: Conjunctivae and lids unremarkable. Cardiovascular: Regular rhythm.  Lungs: Normal work of breathing. Neurologic: No focal deficits.   Lab Results  Component Value Date   CREATININE 0.99 07/12/2021   BUN 16 07/12/2021   NA 140 07/12/2021   K 4.6 07/12/2021   CL 98 07/12/2021   CO2 26 07/12/2021   Lab Results  Component Value Date   ALT 16 07/12/2021   AST 26 07/12/2021   ALKPHOS 119 07/12/2021   BILITOT 0.4 07/12/2021   Lab Results  Component Value Date   HGBA1C 6.1 09/06/2020   HGBA1C 5.8 01/07/2020   Lab Results  Component Value Date   INSULIN 9.5 07/12/2021   Lab Results  Component Value Date   TSH 2.27 09/06/2020   No results found for: CHOL, HDL, LDLCALC, LDLDIRECT, TRIG, CHOLHDL Lab Results  Component Value Date   VD25OH 36.99 09/06/2020   VD25OH 34.95 01/07/2020   Lab Results  Component Value Date   WBC 7.1 09/29/2020   HGB 12.0 09/29/2020   HCT 36.1 09/29/2020   MCV 80.8 09/29/2020   PLT 242.0 09/29/2020   Lab Results  Component Value Date   IRON 44 (L)  09/29/2020   TIBC 360 09/29/2020   FERRITIN 22.7 09/06/2020   Attestation Statements:   Reviewed by clinician on day of visit: allergies, medications, problem list, medical history, surgical history, family history, social history, and previous encounter notes.  Time spent on visit including pre-visit chart review and post-visit care and charting was 31 minutes.    I, Trixie Dredge, am acting as transcriptionist for Dennard Nip, MD.  I have reviewed the above documentation for  accuracy and completeness, and I agree with the above. -  Dennard Nip, MD

## 2021-11-10 NOTE — Telephone Encounter (Signed)
From patient.

## 2021-11-11 ENCOUNTER — Other Ambulatory Visit: Payer: Self-pay

## 2021-11-11 ENCOUNTER — Ambulatory Visit: Payer: BC Managed Care – PPO | Admitting: Physical Therapy

## 2021-11-11 ENCOUNTER — Encounter: Payer: Self-pay | Admitting: Physical Therapy

## 2021-11-11 ENCOUNTER — Telehealth: Payer: Self-pay

## 2021-11-11 DIAGNOSIS — R293 Abnormal posture: Secondary | ICD-10-CM

## 2021-11-11 DIAGNOSIS — M549 Dorsalgia, unspecified: Secondary | ICD-10-CM | POA: Diagnosis not present

## 2021-11-11 DIAGNOSIS — M25512 Pain in left shoulder: Secondary | ICD-10-CM | POA: Diagnosis not present

## 2021-11-11 DIAGNOSIS — M6281 Muscle weakness (generalized): Secondary | ICD-10-CM

## 2021-11-11 DIAGNOSIS — G8929 Other chronic pain: Secondary | ICD-10-CM | POA: Diagnosis not present

## 2021-11-11 DIAGNOSIS — I209 Angina pectoris, unspecified: Secondary | ICD-10-CM

## 2021-11-11 DIAGNOSIS — M546 Pain in thoracic spine: Secondary | ICD-10-CM | POA: Diagnosis not present

## 2021-11-11 DIAGNOSIS — R739 Hyperglycemia, unspecified: Secondary | ICD-10-CM

## 2021-11-11 DIAGNOSIS — K6389 Other specified diseases of intestine: Secondary | ICD-10-CM | POA: Diagnosis not present

## 2021-11-11 DIAGNOSIS — M25511 Pain in right shoulder: Secondary | ICD-10-CM | POA: Diagnosis not present

## 2021-11-11 NOTE — Telephone Encounter (Signed)
ICD-10-CM   1. Angina pectoris with normal coronary arteriogram ([place])  I20.9 Ambulatory referral to Nutrition and Diabetic Education    2. Hyperglycemia  R73.9 Ambulatory referral to Nutrition and Diabetic Education

## 2021-11-11 NOTE — Therapy (Signed)
OUTPATIENT PHYSICAL THERAPY TREATMENT NOTE   Patient Name: Kim Kim MRN: [id number] DOB:1956/05/17, 66 y.o., female Today's Date: 11/11/2021  PCP: Merryl Hacker, No REFERRING PROVIDER: Stefanie Libel, MD   PT End of Session - 11/11/21 0851     Visit Number 2    Number of Visits 12    Date for PT Re-Evaluation 12/14/21    Authorization Type BCBS    PT Start Time 0850    PT Stop Time 0930    PT Time Calculation (min) 40 min             Past Medical History:  Diagnosis Date   Allergy    seasonal allergies   Anemia    hx of   Carotid atherosclerosis    Epilepsy ([place])    Gastric ulcer    hx of   GERD (gastroesophageal reflux disease)    on meds   Growth hormone deficiency ([place])    Hiatal hernia    Hiatal hernia    Hypothyroidism    hx of Hashimoto thyroiditis   Pituitary adenoma ([place])    hx of   Seizures ([place])    temporal lobe   Thyroid disease    on meds   Vitamin D deficiency    Past Surgical History:  Procedure Laterality Date   COLONOSCOPY  12/10/2015   [place] Dr Meredith Leeds   ESOPHAGOGASTRODUODENOSCOPY  06/03/2019   Dr Celso Sickle, [place] EXTRACTION     Patient Active Problem List   Diagnosis Date Noted   Thoracic back pain 05/19/2020   Prediabetes 01/22/2020   Weight gain 01/22/2020   Osteopenia 01/22/2020   Osteoarthritis 07/17/2019   Family history of heart disease 12/11/2018   Atypical chest pain 12/11/2018   B12 deficiency 10/16/2017   Elevated BP without diagnosis of hypertension 10/01/2017   Osteoporosis 10/01/2017   Uterine fibroid 07/31/2017   History of cleft palate 11/22/2016   Velopharyngeal insufficiency (VPI), 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   Normocytic anemia 07/31/2016   Screen for colon cancer 07/31/2016   Iron deficiency 04/27/2016   Hypothyroidism 12/17/2012   Insulin resistance 12/17/2012   Auras 10/15/2012   Hashimoto's thyroiditis 07/24/2012   Hyperlipidemia 07/24/2012   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   Basal  metabolism function study abnormality 03/07/2012   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   Dyspnea 07/09/2011   Pulmonary nodules 07/09/2011   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[place]) 89/38/1017   Dysmetabolic syndrome X 51/11/5850   Growth hormone deficiency ([place]) 03/28/2011   Low HDL (under 40) 03/28/2011   GERD (gastroesophageal reflux disease) 01/01/2009   Obesity 05/19/2008   Vitamin D deficiency 10/07/2007  PCP: Pcp, No   REFERRING PROVIDER: Stefanie Libel, MD   REFERRING DIAG: M25.511,M25.512 (ICD-10-CM) - Bilateral shoulder pain, unspecified chronicity R29.3 (ICD-10-CM) - Posture abnormality M54.9 (ICD-10-CM) - Upper back pain   THERAPY DIAG:  Abnormal posture  Muscle weakness (generalized)  ONSET DATE: chronic   PERTINENT HISTORY: From Previous provider, Dr. Oneida Alar Patient is a 66 y.o. female here for follow-up of upper back pain and right knee OA.   Upper back pain/posterior shoulder pain - patient has dealt with this issue for the past few years.  Over the last 3 years she has been back and forth between [place] and here with moving boxes, in and out of homes, helping care for her father.  She states during this process she noted some pain in the upper back and posterior shoulders.  Her right is worse than the left.  She will get pain with certain twisting motions or trying to put the arms overhead.  She saw Dr. Barbaraann Barthel for this on 09/14/2021, and was thought to have more pain in the trapezius and scapular stabilizers.  She had been sent to physical therapy and has been doing some aquatic therapy based exercises which have been helping to some extent.  She presents today to discuss home exercises and activities that she can do at the gym to help improve this pain and strengthen this.  She denies any redness, swelling or bruising.  She feels like she has some stiffness and restricted range of motion with her UE movement.   PRECAUTIONS: None   WEIGHT BEARING RESTRICTIONS  No   Subjective:  No pain. I have been doing the  PAIN:  Are you having pain? No pain VAS scale: 0/10 Pain location: Rt  post shoulder Pain orientation: Right and Posterior  PAIN TYPE: soreness Pain description: intermittent and aching  Aggravating factors: moving, rotating  Relieving factors: rest, does not use heat , med     OBJECTIVE:    DIAGNOSTIC FINDINGS:  None recent    PATIENT SURVEYS:  FOTO 54%, 63%   COGNITION:          Overall cognitive status: Within functional limits for tasks assessed                               SENSATION:          Light touch: Appears intact          Stereognosis: Appears intact          Hot/Cold: Appears intact          Proprioception: Appears intact   POSTURE: Stands with forward head, upper crossed syndrome, rounded back   PALPATION: Tender, painful with gross palpation to bilateral upper back, periscapular area, upper trap.  Pain also in thoracic and lumbar paraspinals with light palpation.      UPPER EXTREMITY AROM/PROM:   A/PROM Right 11/02/2021 Left 11/02/2021  Shoulder flexion [facility] WFL stiff superior shoulder  Shoulder extension      Shoulder abduction 115 125  Shoulder adduction      Shoulder internal rotation FR to upper lumbar FR to mid T   Shoulder external rotation FR to Rt T1 FR to Lt T1  (Blank rows = not tested)   UPPER EXTREMITY MMT:   MMT Right 11/02/2021 Left 11/02/2021  Shoulder flexion 3/5 3/5  Shoulder extension      Shoulder abduction 3/5 3/5  Shoulder adduction      Middle trapezius 3/5 3+/5  Lower trapezius      Elbow flexion 4+/5 4+/5  Elbow extension 4/5 4/5  Wrist flexion      Wrist extension      Wrist ulnar deviation      Wrist radial deviation      Wrist pronation      Wrist supination      Grip strength (lbs) 20  24  (Blank rows = not tested)   Stiffness in Rt >Lt.  Internal rotation, posterior capsule  T spine hypomobile            Treatment       11/11/21 Therapeutic Exercise:    -Pilates Reformer :  Foot work: neutral on heels, toes , ER 2 red 1 yellow Supine arms arcs, circles, reverse 1 red 1 yellow Seated forward on box : rows ,  bicep curls 1 red 1 yellow Prone shoulder extension, Bent T pull, tricep press 1 red 1 yellow  -Mat: review of HEP open books and prone retraction + extensions       Initial TREATMENT:  Intro to PT, HEP, spine mobility, muscle strength for longevity.  POC     PATIENT EDUCATION: Education details: PT/POC, HEP, posture, pilates Person educated: Patient Education method: Explanation Education comprehension: verbalized understanding and needs further education     HOME EXERCISE PROGRAM: Access Code: [email or web address] URL: [email or web address] Date: 11/02/2021 Prepared by: Raeford Razor   Exercises Sidelying Thoracic Rotation with Open Book - 2-3 x daily - 7 x weekly - 2 sets - 10 reps - 10 hold Prone Scapular Retraction - 1-2 x daily - 7 x weekly - 2 sets - 10 reps - 5 hold Prone Scapular Slide with Shoulder Extension - 1-2 x daily - 7 x weekly - 2 sets - 10 reps - 5 hold     ASSESSMENT:   CLINICAL IMPRESSION: Pt arrives reporting compliance with HEP while out of town but not many reps. Reviewed HEP and she required mod cues to complete correctly. Pt requested to complete reformer exercises today which she is familiar with. Re-introduced patient to footwork, supine arms, seated on box and prone. Pt requires mod cues for correct technique, neutral spine and breathing throughout session.  At end of session she reported no increased pain.    REHAB POTENTIAL: Excellent   CLINICAL DECISION MAKING: Stable/uncomplicated   EVALUATION COMPLEXITY: Low     GOALS: Goals reviewed with patient? No   LONG TERM GOALS:    LTG Name Target Date Goal status  1 Pt will be able to show Independence with HEP for strength and  Baseline: 12/14/2021 INITIAL  2 Pt will increase strength in bilateral UEs to 4/5 or better to support upper  body activities Baseline: 12/14/2021 INITIAL  3 Pt will be able to notice no more than min increased pain, discomfort with rotational movements, use of UE and trunk Baseline: 12/14/2021 INITIAL  4 Pt will be able to self correct for standing and seated postures without cues and be more aware throughout the day  Baseline: 12/14/2021 INITIAL  5 Pt will improve FOTO score to 70% or better in each body part  Baseline: 54% (S), 63% (T) 12/14/2021 INITIAL                      PLAN: PT FREQUENCY: 1-2x/week 1-2x/week PT DURATION: 6 weeks   PLANNED INTERVENTIONS: Therapeutic exercises, Therapeutic activity, Neuro Muscular re-education, Balance training, Gait training, Patient/Family education, Joint mobilization, Dry Needling, Spinal mobilization, Cryotherapy, Moist heat, Manual therapy, and Pilates based strength   PLAN FOR NEXT SESSION: check HEP and begin basic Pilates exercises: footwork, supine arms, mermaid  and prone ; add IR stretch     Hessie Diener, PTA 11/11/21 9:47 AM Phone: [phone] Fax: [phone]

## 2021-11-15 ENCOUNTER — Ambulatory Visit (INDEPENDENT_AMBULATORY_CARE_PROVIDER_SITE_OTHER): Payer: BC Managed Care – PPO | Admitting: Family Medicine

## 2021-11-15 ENCOUNTER — Other Ambulatory Visit: Payer: Self-pay

## 2021-11-15 ENCOUNTER — Ambulatory Visit: Payer: BC Managed Care – PPO | Admitting: Physical Therapy

## 2021-11-15 ENCOUNTER — Encounter: Payer: Self-pay | Admitting: Physical Therapy

## 2021-11-15 DIAGNOSIS — M546 Pain in thoracic spine: Secondary | ICD-10-CM | POA: Diagnosis not present

## 2021-11-15 DIAGNOSIS — G8929 Other chronic pain: Secondary | ICD-10-CM | POA: Diagnosis not present

## 2021-11-15 DIAGNOSIS — R293 Abnormal posture: Secondary | ICD-10-CM | POA: Diagnosis not present

## 2021-11-15 DIAGNOSIS — M25512 Pain in left shoulder: Secondary | ICD-10-CM | POA: Diagnosis not present

## 2021-11-15 DIAGNOSIS — M549 Dorsalgia, unspecified: Secondary | ICD-10-CM | POA: Diagnosis not present

## 2021-11-15 DIAGNOSIS — M6281 Muscle weakness (generalized): Secondary | ICD-10-CM

## 2021-11-15 DIAGNOSIS — M25511 Pain in right shoulder: Secondary | ICD-10-CM | POA: Diagnosis not present

## 2021-11-15 NOTE — Therapy (Signed)
OUTPATIENT PHYSICAL THERAPY TREATMENT NOTE   Patient Name: Kim Kim MRN: [id number] DOB:September 11, 1956, 66 y.o., female Today's Date: 11/15/2021  PCP: Merryl Hacker, No REFERRING PROVIDER: Stefanie Libel, MD   PT End of Session - 11/15/21 0851     Visit Number 3    Number of Visits 12    Date for PT Re-Evaluation 12/14/21    Authorization Type BCBS    PT Start Time 0849    PT Stop Time 0935    PT Time Calculation (min) 46 min    Activity Tolerance Patient tolerated treatment well    Behavior During Therapy WFL for tasks assessed/performed             Past Medical History:  Diagnosis Date   Allergy    seasonal allergies   Anemia    hx of   Carotid atherosclerosis    Epilepsy ([place])    Gastric ulcer    hx of   GERD (gastroesophageal reflux disease)    on meds   Growth hormone deficiency ([place])    Hiatal hernia    Hiatal hernia    Hypothyroidism    hx of Hashimoto thyroiditis   Pituitary adenoma ([place])    hx of   Seizures ([place])    temporal lobe   Thyroid disease    on meds   Vitamin D deficiency    Past Surgical History:  Procedure Laterality Date   COLONOSCOPY  12/10/2015   [place] Dr Meredith Leeds   ESOPHAGOGASTRODUODENOSCOPY  06/03/2019   Dr Celso Sickle, [place] EXTRACTION     Patient Active Problem List   Diagnosis Date Noted   Thoracic back pain 05/19/2020   Prediabetes 01/22/2020   Weight gain 01/22/2020   Osteopenia 01/22/2020   Osteoarthritis 07/17/2019   Family history of heart disease 12/11/2018   Atypical chest pain 12/11/2018   B12 deficiency 10/16/2017   Elevated BP without diagnosis of hypertension 10/01/2017   Osteoporosis 10/01/2017   Uterine fibroid 07/31/2017   History of cleft palate 11/22/2016   Velopharyngeal insufficiency (VPI), 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   Normocytic anemia 07/31/2016   Screen for colon cancer 07/31/2016   Iron deficiency 04/27/2016   Hypothyroidism 12/17/2012   Insulin resistance 12/17/2012   Auras  10/15/2012   Hashimoto's thyroiditis 07/24/2012   Hyperlipidemia 07/24/2012   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   Basal metabolism function study abnormality 03/07/2012   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   Dyspnea 07/09/2011   Pulmonary nodules 07/09/2011   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[place]) 46/50/3546   Dysmetabolic syndrome X 56/81/2751   Growth hormone deficiency ([place]) 03/28/2011   Low HDL (under 40) 03/28/2011   GERD (gastroesophageal reflux disease) 01/01/2009   Obesity 05/19/2008   Vitamin D deficiency 10/07/2007  PCP: Pcp, No   REFERRING PROVIDER: Stefanie Libel, MD   REFERRING DIAG: [phone] (ICD-10-CM) - Bilateral shoulder pain, unspecified chronicity R29.3 (ICD-10-CM) - Posture abnormality M54.9 (ICD-10-CM) - Upper back pain   THERAPY DIAG:  Abnormal posture  Muscle weakness (generalized)  Chronic right shoulder pain  Pain in thoracic spine  ONSET DATE: chronic   PERTINENT HISTORY: From Previous provider, Dr. Oneida Alar Patient is a 66 y.o. female here for follow-up of upper back pain and right knee OA.   Upper back pain/posterior shoulder pain - patient has dealt with this issue for the past few years.  Over the last 3 years she has been back and forth between [place] and here with moving boxes, in and out of homes, helping care  for her father.  She states during this process she noted some pain in the upper back and posterior shoulders.  Her right is worse than the left.  She will get pain with certain twisting motions or trying to put the arms overhead.  She saw Dr. Barbaraann Barthel for this on 09/14/2021, and was thought to have more pain in the trapezius and scapular stabilizers.  She had been sent to physical therapy and has been doing some aquatic therapy based exercises which have been helping to some extent.  She presents today to discuss home exercises and activities that she can do at the gym to help improve this pain and strengthen this.  She denies any  redness, swelling or bruising.  She feels like she has some stiffness and restricted range of motion with her UE movement.   PRECAUTIONS: None   WEIGHT BEARING RESTRICTIONS No   Subjective:  Posture is really important to me.  I am excited to do the Reformer.   PAIN:  Are you having pain? No pain VAS scale: 0/10 Pain location: Rt  post shoulder Pain orientation: Right and Posterior  PAIN TYPE: soreness Pain description: intermittent and aching  Aggravating factors: moving, rotating  Relieving factors: rest, does not use heat , med     OBJECTIVE:    DIAGNOSTIC FINDINGS:  None recent    PATIENT SURVEYS:  FOTO 54%, 63%   COGNITION:          Overall cognitive status: Within functional limits for tasks assessed                               SENSATION:          Light touch: Appears intact          Stereognosis: Appears intact          Hot/Cold: Appears intact          Proprioception: Appears intact   POSTURE: Stands with forward head, upper crossed syndrome, rounded back   PALPATION: Tender, painful with gross palpation to bilateral upper back, periscapular area, upper trap.  Pain also in thoracic and lumbar paraspinals with light palpation.      UPPER EXTREMITY AROM/PROM:   A/PROM Right 11/02/2021 Left 11/02/2021  Shoulder flexion [facility] WFL stiff superior shoulder  Shoulder extension      Shoulder abduction 115 125  Shoulder adduction      Shoulder internal rotation FR to upper lumbar FR to mid T   Shoulder external rotation FR to Rt T1 FR to Lt T1  (Blank rows = not tested)   UPPER EXTREMITY MMT:   MMT Right 11/02/2021 Left 11/02/2021  Shoulder flexion 3/5 3/5  Shoulder extension      Shoulder abduction 3/5 3/5  Shoulder adduction      Middle trapezius 3/5 3+/5  Lower trapezius      Elbow flexion 4+/5 4+/5  Elbow extension 4/5 4/5  Wrist flexion      Wrist extension      Wrist ulnar deviation      Wrist radial deviation      Wrist pronation      Wrist  supination      Grip strength (lbs) 20  24  (Blank rows = not tested)   Stiffness in Rt >Lt.  Internal rotation, posterior capsule  T spine hypomobile             Treatment: 11/15/21 _______________________________________________________________________________________________________ Pilates Reformer  used for LE/core strength, postural strength, lumbopelvic disassociation and core control.  Exercises included:  Transverse abdominus activation, with ball squeeze, breathing patterning.  Footwork 2 red 1 blue 1 yellow  Heels, in parallel, turnout and wide turnout  Coordination of breathing with movement    Bridging same 4 springs heavy cues for spine articulation x 10  Supine Arm work 1 red  1 yellow Arcs in parallel and T shape x 8 each   Quadruped   Active Cat camel   Round back 1 red LE only  x 10    Mermaid 1 blue  X 6  then spine extension with rotation , spiral hip stretching   Press out with long box 1 blue   Added rotation for T spine     Treatment       11/11/21 Therapeutic Exercise:   -Pilates Reformer :  Foot work: neutral on heels, toes , ER 2 red 1 yellow Supine arms arcs, circles, reverse 1 red 1 yellow Seated forward on box : rows , bicep curls 1 red 1 yellow Prone shoulder extension, Bent T pull, tricep press 1 red 1 yellow  -Mat: review of HEP open books and prone retraction + extensions       Initial TREATMENT:  Intro to PT, HEP, spine mobility, muscle strength for longevity.  POC     PATIENT EDUCATION: Education details: PT/POC, HEP, posture, pilates Person educated: Patient Education method: Explanation Education comprehension: verbalized understanding and needs further education     HOME EXERCISE PROGRAM: Access Code: [email or web address] URL: [email or web address] Date: 11/02/2021 Prepared by: Raeford Razor   Exercises Sidelying Thoracic Rotation with Open Book - 2-3 x daily - 7 x weekly - 2 sets - 10 reps - 10 hold Prone Scapular  Retraction - 1-2 x daily - 7 x weekly - 2 sets - 10 reps - 5 hold Prone Scapular Slide with Shoulder Extension - 1-2 x daily - 7 x weekly - 2 sets - 10 reps - 5 hold     ASSESSMENT:   CLINICAL IMPRESSION: Patient with poor coordination of breathing and movement for proper technique.  She needs cues for lower abdominals and lower spine flexion.  Modified spring tension with Mermaid (sidebending) to allow shoulder stability. Definite stiffness in lumbar as well as thoracic spine.     REHAB POTENTIAL: Excellent   CLINICAL DECISION MAKING: Stable/uncomplicated   EVALUATION COMPLEXITY: Low     GOALS: Goals reviewed with patient? No   LONG TERM GOALS:    LTG Name Target Date Goal status  1 Pt will be able to show Independence with HEP for strength and  Baseline: 12/14/2021 INITIAL  2 Pt will increase strength in bilateral UEs to 4/5 or better to support upper body activities Baseline: 12/14/2021 INITIAL  3 Pt will be able to notice no more than min increased pain, discomfort with rotational movements, use of UE and trunk Baseline: 12/14/2021 INITIAL  4 Pt will be able to self correct for standing and seated postures without cues and be more aware throughout the day  Baseline: 12/14/2021 INITIAL  5 Pt will improve FOTO score to 70% or better in each body part  Baseline: 54% (S), 63% (T) 12/14/2021 INITIAL                      PLAN: PT FREQUENCY: 1-2x/week 1-2x/week PT DURATION: 6 weeks   PLANNED INTERVENTIONS: Therapeutic exercises, Therapeutic activity, Neuro Muscular re-education, Balance training, Gait training, Patient/Family education, Joint  mobilization, Dry Needling, Spinal mobilization, Cryotherapy, Moist heat, Manual therapy, and Pilates based strength   PLAN FOR NEXT SESSION: check HEP and begin basic Pilates exercises: footwork, supine arms, mermaid  and prone ; add IR stretch   Raeford Razor, PT 11/15/21 9:50 AM Phone: [phone] Fax: [phone]

## 2021-11-17 ENCOUNTER — Other Ambulatory Visit: Payer: Self-pay

## 2021-11-17 ENCOUNTER — Ambulatory Visit: Payer: BC Managed Care – PPO | Admitting: Physical Therapy

## 2021-11-17 ENCOUNTER — Encounter: Payer: Self-pay | Admitting: Physical Therapy

## 2021-11-17 DIAGNOSIS — R293 Abnormal posture: Secondary | ICD-10-CM | POA: Diagnosis not present

## 2021-11-17 DIAGNOSIS — M6281 Muscle weakness (generalized): Secondary | ICD-10-CM | POA: Diagnosis not present

## 2021-11-17 DIAGNOSIS — M546 Pain in thoracic spine: Secondary | ICD-10-CM | POA: Diagnosis not present

## 2021-11-17 DIAGNOSIS — G8929 Other chronic pain: Secondary | ICD-10-CM | POA: Diagnosis not present

## 2021-11-17 DIAGNOSIS — M25512 Pain in left shoulder: Secondary | ICD-10-CM | POA: Diagnosis not present

## 2021-11-17 DIAGNOSIS — M549 Dorsalgia, unspecified: Secondary | ICD-10-CM | POA: Diagnosis not present

## 2021-11-17 DIAGNOSIS — M25511 Pain in right shoulder: Secondary | ICD-10-CM | POA: Diagnosis not present

## 2021-11-17 NOTE — Therapy (Signed)
OUTPATIENT PHYSICAL THERAPY TREATMENT NOTE   Patient Name: Kim Kim MRN: [id number] DOB:17-Feb-1956, 66 y.o., female Today's Date: 11/17/2021  PCP: Merryl Hacker, No REFERRING PROVIDER: Stefanie Libel, MD   PT End of Session - 11/17/21 1052     Visit Number 4    Number of Visits 12    Date for PT Re-Evaluation 12/14/21    Authorization Type BCBS    PT Start Time 0935    PT Stop Time 4132    PT Time Calculation (min) 43 min    Activity Tolerance Patient tolerated treatment well    Behavior During Therapy WFL for tasks assessed/performed              Past Medical History:  Diagnosis Date   Allergy    seasonal allergies   Anemia    hx of   Carotid atherosclerosis    Epilepsy ([place])    Gastric ulcer    hx of   GERD (gastroesophageal reflux disease)    on meds   Growth hormone deficiency ([place])    Hiatal hernia    Hiatal hernia    Hypothyroidism    hx of Hashimoto thyroiditis   Pituitary adenoma ([place])    hx of   Seizures ([place])    temporal lobe   Thyroid disease    on meds   Vitamin D deficiency    Past Surgical History:  Procedure Laterality Date   COLONOSCOPY  12/10/2015   [place] Dr Meredith Leeds   ESOPHAGOGASTRODUODENOSCOPY  06/03/2019   Dr Celso Sickle, [place] EXTRACTION     Patient Active Problem List   Diagnosis Date Noted   Thoracic back pain 05/19/2020   Prediabetes 01/22/2020   Weight gain 01/22/2020   Osteopenia 01/22/2020   Osteoarthritis 07/17/2019   Family history of heart disease 12/11/2018   Atypical chest pain 12/11/2018   B12 deficiency 10/16/2017   Elevated BP without diagnosis of hypertension 10/01/2017   Osteoporosis 10/01/2017   Uterine fibroid 07/31/2017   History of cleft palate 11/22/2016   Velopharyngeal insufficiency (VPI), 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   Normocytic anemia 07/31/2016   Screen for colon cancer 07/31/2016   Iron deficiency 04/27/2016   Hypothyroidism 12/17/2012   Insulin resistance 12/17/2012   Auras  10/15/2012   Hashimoto's thyroiditis 07/24/2012   Hyperlipidemia 07/24/2012   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   Basal metabolism function study abnormality 03/07/2012   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   Dyspnea 07/09/2011   Pulmonary nodules 07/09/2011   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[place]) 44/10/270   Dysmetabolic syndrome X 53/66/4403   Growth hormone deficiency ([place]) 03/28/2011   Low HDL (under 40) 03/28/2011   GERD (gastroesophageal reflux disease) 01/01/2009   Obesity 05/19/2008   Vitamin D deficiency 10/07/2007  PCP: Pcp, No   REFERRING PROVIDER: Stefanie Libel, MD   REFERRING DIAG: [phone] (ICD-10-CM) - Bilateral shoulder pain, unspecified chronicity R29.3 (ICD-10-CM) - Posture abnormality M54.9 (ICD-10-CM) - Upper back pain   THERAPY DIAG:  Abnormal posture  Muscle weakness (generalized)  Chronic right shoulder pain  Pain in thoracic spine  ONSET DATE: chronic   PERTINENT HISTORY: From Previous provider, Dr. Oneida Alar Patient is a 66 y.o. female here for follow-up of upper back pain and right knee OA.   Upper back pain/posterior shoulder pain - patient has dealt with this issue for the past few years.  Over the last 3 years she has been back and forth between [place] and here with moving boxes, in and out of homes, helping  care for her father.  She states during this process she noted some pain in the upper back and posterior shoulders.  Her right is worse than the left.  She will get pain with certain twisting motions or trying to put the arms overhead.  She saw Dr. Barbaraann Barthel for this on 09/14/2021, and was thought to have more pain in the trapezius and scapular stabilizers.  She had been sent to physical therapy and has been doing some aquatic therapy based exercises which have been helping to some extent.  She presents today to discuss home exercises and activities that she can do at the gym to help improve this pain and strengthen this.  She denies any  redness, swelling or bruising.  She feels like she has some stiffness and restricted range of motion with her UE movement.   PRECAUTIONS: None   WEIGHT BEARING RESTRICTIONS No   Subjective:  no pain I wish I was more sore after last time.   PAIN:  Are you having pain? No pain VAS scale: 0/10 Pain location: Rt  post shoulder Pain orientation: Right and Posterior  PAIN TYPE: soreness Pain description: intermittent and aching  Aggravating factors: moving, rotating  Relieving factors: rest, does not use heat , med     OBJECTIVE:    DIAGNOSTIC FINDINGS:  None recent    PATIENT SURVEYS:  FOTO 54%, 63%   COGNITION:          Overall cognitive status: Within functional limits for tasks assessed                               SENSATION:          Light touch: Appears intact          Stereognosis: Appears intact          Hot/Cold: Appears intact          Proprioception: Appears intact   POSTURE: Stands with forward head, upper crossed syndrome, rounded back   PALPATION: Tender, painful with gross palpation to bilateral upper back, periscapular area, upper trap.  Pain also in thoracic and lumbar paraspinals with light palpation.      UPPER EXTREMITY AROM/PROM:   A/PROM Right 11/02/2021 Left 11/02/2021  Shoulder flexion [facility] WFL stiff superior shoulder  Shoulder extension      Shoulder abduction 115 125  Shoulder adduction      Shoulder internal rotation FR to upper lumbar FR to mid T   Shoulder external rotation FR to Rt T1 FR to Lt T1  (Blank rows = not tested)   UPPER EXTREMITY MMT:   MMT Right 11/02/2021 Left 11/02/2021  Shoulder flexion 3/5 3/5  Shoulder extension      Shoulder abduction 3/5 3/5  Shoulder adduction      Middle trapezius 3/5 3+/5  Lower trapezius      Elbow flexion 4+/5 4+/5  Elbow extension 4/5 4/5  Wrist flexion      Wrist extension      Wrist ulnar deviation      Wrist radial deviation      Wrist pronation      Wrist supination      Grip  strength (lbs) 20  24  (Blank rows = not tested)   Stiffness in Rt >Lt.  Internal rotation, posterior capsule  T spine hypomobile            [facility] Adult PT Treatment/Exercise:   1/19/23___________________________________________  Therapeutic Exercise:  Standing Springboard  Roll down bar:  Extension x 10  Row x 10  Upper ab curl x 10  Row with yellow springs x 10 in mini squat hold  Palloff press x 15 added small ROM rotation x 5  both sides   Supine arms  Arcs x 10   Arcs with bridge x 10 Trunk rotation x 3 reset   Lower abs bent knee lower lift x 10   Single leg arcs yellow x 10 each leg  Circles x 10 each direction Standing slastix bands  Chest press   Horizontal abd x 10   ER x 10   Diagonal pull x 10   Demo for HEP UE routine  : horizontal abd, row and ER /IR with blue band x 15 each , mod cues    Treatment: 11/15/21 _______________________________________________________________________________________________________ Pilates Reformer used for LE/core strength, postural strength, lumbopelvic disassociation and core control.  Exercises included:  Transverse abdominus activation, with ball squeeze, breathing patterning.  Footwork 2 red 1 blue 1 yellow  Heels, in parallel, turnout and wide turnout  Coordination of breathing with movement    Bridging same 4 springs heavy cues for spine articulation x 10  Supine Arm work 1 red  1 yellow Arcs in parallel and T shape x 8 each   Quadruped   Active Cat camel   Round back 1 red LE only  x 10    Mermaid 1 blue  X 6  then spine extension with rotation , spiral hip stretching   Press out with long box 1 blue   Added rotation for T spine     Treatment       11/11/21 Therapeutic Exercise:   -Pilates Reformer :  Foot work: neutral on heels, toes , ER 2 red 1 yellow Supine arms arcs, circles, reverse 1 red 1 yellow Seated forward on box : rows , bicep curls 1 red 1 yellow Prone shoulder extension, Bent T pull,  tricep press 1 red 1 yellow  -Mat: review of HEP open books and prone retraction + extensions       Initial TREATMENT:  Intro to PT, HEP, spine mobility, muscle strength for longevity.  POC     PATIENT EDUCATION: Education details: PT/POC, HEP, posture, pilates Person educated: Patient Education method: Explanation Education comprehension: verbalized understanding and needs further education     HOME EXERCISE PROGRAM: Access Code: [email or web address] URL: [email or web address] Date: 11/02/2021 Prepared by: Raeford Razor   ASSESSMENT:   CLINICAL IMPRESSION:  Patient able to participate in UE strength using Pilates Springboard and theraband. Needs min cues for posture and scapular position.  She will benefit from a few more PT visits focusing on UE strength and stability to transition to community Pilates.    REHAB POTENTIAL: Excellent   CLINICAL DECISION MAKING: Stable/uncomplicated   EVALUATION COMPLEXITY: Low     GOALS: Goals reviewed with patient? No   LONG TERM GOALS:    LTG Name Target Date Goal status  1 Pt will be able to show Independence with HEP for strength and  Baseline: 12/14/2021 INITIAL  2 Pt will increase strength in bilateral UEs to 4/5 or better to support upper body activities Baseline: 12/14/2021 INITIAL  3 Pt will be able to notice no more than min increased pain, discomfort with rotational movements, use of UE and trunk Baseline: 12/14/2021 INITIAL  4 Pt will be able to self correct for standing and seated postures without cues and be more aware throughout  the day  Baseline: 12/14/2021 INITIAL  5 Pt will improve FOTO score to 70% or better in each body part  Baseline: 54% (S), 63% (T) 12/14/2021 INITIAL                      PLAN: PT FREQUENCY: 1-2x/week 1-2x/week PT DURATION: 6 weeks   PLANNED INTERVENTIONS: Therapeutic exercises, Therapeutic activity, Neuro Muscular re-education, Balance training, Gait training, Patient/Family education,  Joint mobilization, Dry Needling, Spinal mobilization, Cryotherapy, Moist heat, Manual therapy, and Pilates based strength   PLAN FOR NEXT SESSION: check HEP and begin basic Pilates exercises: footwork, supine arms, mermaid  and prone ; add IR stretch   Raeford Razor, PT 11/17/21 10:54 AM Phone: [phone] Fax: [phone]

## 2021-11-18 DIAGNOSIS — K6389 Other specified diseases of intestine: Secondary | ICD-10-CM | POA: Diagnosis not present

## 2021-11-22 ENCOUNTER — Encounter: Payer: Self-pay | Admitting: Physical Therapy

## 2021-11-22 ENCOUNTER — Ambulatory Visit: Payer: BC Managed Care – PPO | Admitting: Physical Therapy

## 2021-11-22 ENCOUNTER — Other Ambulatory Visit: Payer: Self-pay

## 2021-11-22 ENCOUNTER — Ambulatory Visit: Payer: BC Managed Care – PPO | Admitting: Emergency Medicine

## 2021-11-22 DIAGNOSIS — G8929 Other chronic pain: Secondary | ICD-10-CM

## 2021-11-22 DIAGNOSIS — M25512 Pain in left shoulder: Secondary | ICD-10-CM | POA: Diagnosis not present

## 2021-11-22 DIAGNOSIS — M25511 Pain in right shoulder: Secondary | ICD-10-CM | POA: Diagnosis not present

## 2021-11-22 DIAGNOSIS — R293 Abnormal posture: Secondary | ICD-10-CM

## 2021-11-22 DIAGNOSIS — M6281 Muscle weakness (generalized): Secondary | ICD-10-CM | POA: Diagnosis not present

## 2021-11-22 DIAGNOSIS — M546 Pain in thoracic spine: Secondary | ICD-10-CM

## 2021-11-22 DIAGNOSIS — M549 Dorsalgia, unspecified: Secondary | ICD-10-CM | POA: Diagnosis not present

## 2021-11-22 NOTE — Therapy (Signed)
OUTPATIENT PHYSICAL THERAPY TREATMENT NOTE   Patient Name: Kim Kim MRN: [id number] DOB:May 13, 1956, 66 y.o., female Today's Date: 11/22/2021  PCP: Merryl Hacker, No REFERRING PROVIDER: Stefanie Libel, MD   PT End of Session - 11/22/21 0851     Visit Number 5    Number of Visits 12    Date for PT Re-Evaluation 12/14/21    Authorization Type BCBS    PT Start Time 0848    PT Stop Time 0930    PT Time Calculation (min) 42 min    Activity Tolerance Patient tolerated treatment well    Behavior During Therapy WFL for tasks assessed/performed              Past Medical History:  Diagnosis Date   Allergy    seasonal allergies   Anemia    hx of   Carotid atherosclerosis    Epilepsy ([place])    Gastric ulcer    hx of   GERD (gastroesophageal reflux disease)    on meds   Growth hormone deficiency ([place])    Hiatal hernia    Hiatal hernia    Hypothyroidism    hx of Hashimoto thyroiditis   Pituitary adenoma ([place])    hx of   Seizures ([place])    temporal lobe   Thyroid disease    on meds   Vitamin D deficiency    Past Surgical History:  Procedure Laterality Date   COLONOSCOPY  12/10/2015   [place] Dr Meredith Leeds   ESOPHAGOGASTRODUODENOSCOPY  06/03/2019   Dr Celso Sickle, [place] EXTRACTION     Patient Active Problem List   Diagnosis Date Noted   Thoracic back pain 05/19/2020   Prediabetes 01/22/2020   Weight gain 01/22/2020   Osteopenia 01/22/2020   Osteoarthritis 07/17/2019   Family history of heart disease 12/11/2018   Atypical chest pain 12/11/2018   B12 deficiency 10/16/2017   Elevated BP without diagnosis of hypertension 10/01/2017   Osteoporosis 10/01/2017   Uterine fibroid 07/31/2017   History of cleft palate 11/22/2016   Velopharyngeal insufficiency (VPI), 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   Normocytic anemia 07/31/2016   Screen for colon cancer 07/31/2016   Iron deficiency 04/27/2016   Hypothyroidism 12/17/2012   Insulin resistance 12/17/2012   Auras  10/15/2012   Hashimoto's thyroiditis 07/24/2012   Hyperlipidemia 07/24/2012   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/24/2012   Basal metabolism function study abnormality 03/07/2012   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   Dyspnea 07/09/2011   Pulmonary nodules 07/09/2011   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[place]) 56/21/3086   Dysmetabolic syndrome X 57/84/6962   Growth hormone deficiency ([place]) 03/28/2011   Low HDL (under 40) 03/28/2011   GERD (gastroesophageal reflux disease) 01/01/2009   Obesity 05/19/2008   Vitamin D deficiency 10/07/2007  PCP: Pcp, No   REFERRING PROVIDER: Stefanie Libel, MD   REFERRING DIAG: [phone] (ICD-10-CM) - Bilateral shoulder pain, unspecified chronicity R29.3 (ICD-10-CM) - Posture abnormality M54.9 (ICD-10-CM) - Upper back pain   THERAPY DIAG:  Abnormal posture  Muscle weakness (generalized)  Chronic right shoulder pain  Pain in thoracic spine  ONSET DATE: chronic   PERTINENT HISTORY: From Previous provider, Dr. Oneida Alar Patient is a 66 y.o. female here for follow-up of upper back pain and right knee OA.   Upper back pain/posterior shoulder pain - patient has dealt with this issue for the past few years.  Over the last 3 years she has been back and forth between [place] and here with moving boxes, in and out of homes, helping  care for her father.  She states during this process she noted some pain in the upper back and posterior shoulders.  Her right is worse than the left.  She will get pain with certain twisting motions or trying to put the arms overhead.  She saw Dr. Barbaraann Barthel for this on 09/14/2021, and was thought to have more pain in the trapezius and scapular stabilizers.  She had been sent to physical therapy and has been doing some aquatic therapy based exercises which have been helping to some extent.  She presents today to discuss home exercises and activities that she can do at the gym to help improve this pain and strengthen this.  She denies any  redness, swelling or bruising.  She feels like she has some stiffness and restricted range of motion with her UE movement.   PRECAUTIONS: None   WEIGHT BEARING RESTRICTIONS No   Subjective:  No pain right now.  Been doing water aerobics.  Would like to do group Pilates one day soon.   PAIN:  Are you having pain? No pain VAS scale: 0/10 Pain location: Rt  post shoulder Pain orientation: Right and Posterior  PAIN TYPE: soreness Pain description: intermittent and aching  Aggravating factors: moving, rotating  Relieving factors: rest, does not use heat , med     OBJECTIVE:    DIAGNOSTIC FINDINGS:  None recent    PATIENT SURVEYS:  FOTO 54%, 63%   COGNITION:          Overall cognitive status: Within functional limits for tasks assessed                               SENSATION:          Light touch: Appears intact          Stereognosis: Appears intact          Hot/Cold: Appears intact          Proprioception: Appears intact   POSTURE: Stands with forward head, upper crossed syndrome, rounded back   PALPATION: Tender, painful with gross palpation to bilateral upper back, periscapular area, upper trap.  Pain also in thoracic and lumbar paraspinals with light palpation.      UPPER EXTREMITY AROM/PROM:   A/PROM Right 11/02/2021 Left 11/02/2021  Shoulder flexion [facility] WFL stiff superior shoulder  Shoulder extension      Shoulder abduction 115 125  Shoulder adduction      Shoulder internal rotation FR to upper lumbar FR to mid T   Shoulder external rotation FR to Rt T1 FR to Lt T1  (Blank rows = not tested)   UPPER EXTREMITY MMT:   MMT Right 11/02/2021 Left 11/02/2021  Shoulder flexion 3/5 3/5  Shoulder extension      Shoulder abduction 3/5 3/5  Shoulder adduction      Middle trapezius 3/5 3+/5  Lower trapezius      Elbow flexion 4+/5 4+/5  Elbow extension 4/5 4/5  Wrist flexion      Wrist extension      Wrist ulnar deviation      Wrist radial deviation      Wrist  pronation      Wrist supination      Grip strength (lbs) 20  24  (Blank rows = not tested)   Stiffness in Rt >Lt.  Internal rotation, posterior capsule  T spine hypomobile  [facility] Adult PT Treatment/Exercise:   1/24/23______________________________________________ Pilates Reformer used for LE/core strength, postural strength, lumbopelvic disassociation and core control.   Exercises included: Footwork 2 red 1 blue 1 yellow  Heels and toes  Heel raises in Pilates V   Single leg press out to hip circles  x 10   Supine Arm work 1 yellow 1 Red   Arcs  x 10 Circles x 10 each direction  Long box Prone  Pulling straps 1 red x 5 then 1 blue for better form , added T for horizontal abduction  Overhead press   Baby cobra first for thoracic extension x 8  tried several different springs, settled on 1 yellow for max comfort each UE   Seated Arms    Facing front serving and hug a tree 1 blue x 10 each   Facing back  1 red row x 10  horizontal abd 1 yellow  x10  External rotation yellow x 10     [facility] Adult PT Treatment/Exercise:   1/19/23___________________________________________   Therapeutic Exercise:  Standing Springboard  Roll down bar:  Extension x 10  Row x 10  Upper ab curl x 10  Row with yellow springs x 10 in mini squat hold  Palloff press x 15 added small ROM rotation x 5  both sides   Supine arms  Arcs x 10   Arcs with bridge x 10 Trunk rotation x 3 reset   Lower abs bent knee lower lift x 10   Single leg arcs yellow x 10 each leg  Circles x 10 each direction Standing slastix bands  Chest press   Horizontal abd x 10   ER x 10   Diagonal pull x 10   Demo for HEP UE routine  : horizontal abd, row and ER /IR with blue band x 15 each , mod cues    Treatment: 11/15/21 _______________________________________________________________________________________________________ Pilates Reformer used for LE/core strength, postural strength, lumbopelvic  disassociation and core control.  Exercises included:  Transverse abdominus activation, with ball squeeze, breathing patterning.  Footwork 2 red 1 blue 1 yellow  Heels, in parallel, turnout and wide turnout  Coordination of breathing with movement    Bridging same 4 springs heavy cues for spine articulation x 10  Supine Arm work 1 red  1 yellow Arcs in parallel and T shape x 8 each   Quadruped   Active Cat camel   Round back 1 red LE only  x 10    Mermaid 1 blue  X 6  then spine extension with rotation , spiral hip stretching   Press out with long box 1 blue   Added rotation for T spine      PATIENT EDUCATION: Education details: PT/POC, HEP, posture, pilates Person educated: Patient Education method: Explanation Education comprehension: verbalized understanding and needs further education     HOME EXERCISE PROGRAM: Access Code: [email or web address] URL: [email or web address] Date: 11/02/2021 Prepared by: Raeford Razor   ASSESSMENT:   CLINICAL IMPRESSION:  Patient needs cueing for thoracic extension and neck position with seated and prone work.  Reinforced the need for full body engagement with all pilates exercises done today.  Modified spring tension for UE strength and full ROM.  Is considering [place] group Reformer in the coming weeks.  Cont POC.    REHAB POTENTIAL: Excellent   CLINICAL DECISION MAKING: Stable/uncomplicated   EVALUATION COMPLEXITY: Low     GOALS: Goals reviewed with patient? No   LONG TERM GOALS:    LTG Name  Target Date Goal status  1 Pt will be able to show Independence with HEP for strength and  Baseline: 12/14/2021 INITIAL  2 Pt will increase strength in bilateral UEs to 4/5 or better to support upper body activities Baseline: 12/14/2021 INITIAL  3 Pt will be able to notice no more than min increased pain, discomfort with rotational movements, use of UE and trunk Baseline: 12/14/2021 INITIAL  4 Pt will be able to self correct for  standing and seated postures without cues and be more aware throughout the day  Baseline: 12/14/2021 INITIAL  5 Pt will improve FOTO score to 70% or better in each body part  Baseline: 54% (S), 63% (T) 12/14/2021 INITIAL                      PLAN: PT FREQUENCY: 1-2x/week 1-2x/week PT DURATION: 6 weeks   PLANNED INTERVENTIONS: Therapeutic exercises, Therapeutic activity, Neuro Muscular re-education, Balance training, Gait training, Patient/Family education, Joint mobilization, Dry Needling, Spinal mobilization, Cryotherapy, Moist heat, Manual therapy, and Pilates based strength   PLAN FOR NEXT SESSION: IR stretching, upper body focus , try prone off the equipment as well   Raeford Razor, PT 11/22/21 10:35 AM Phone: [phone] Fax: [phone]

## 2021-11-24 ENCOUNTER — Ambulatory Visit: Payer: BC Managed Care – PPO | Admitting: Physical Therapy

## 2021-11-24 DIAGNOSIS — L301 Dyshidrosis [pompholyx]: Secondary | ICD-10-CM | POA: Diagnosis not present

## 2021-11-24 DIAGNOSIS — L503 Dermatographic urticaria: Secondary | ICD-10-CM | POA: Diagnosis not present

## 2021-11-24 DIAGNOSIS — L298 Other pruritus: Secondary | ICD-10-CM | POA: Diagnosis not present

## 2021-11-24 DIAGNOSIS — L304 Erythema intertrigo: Secondary | ICD-10-CM | POA: Diagnosis not present

## 2021-11-25 ENCOUNTER — Encounter: Payer: BC Managed Care – PPO | Attending: Cardiology | Admitting: Registered"

## 2021-11-25 DIAGNOSIS — R7303 Prediabetes: Secondary | ICD-10-CM | POA: Insufficient documentation

## 2021-11-28 ENCOUNTER — Encounter: Payer: Self-pay | Admitting: Internal Medicine

## 2021-11-28 ENCOUNTER — Other Ambulatory Visit: Payer: Self-pay

## 2021-11-28 ENCOUNTER — Ambulatory Visit (INDEPENDENT_AMBULATORY_CARE_PROVIDER_SITE_OTHER): Payer: BC Managed Care – PPO | Admitting: Internal Medicine

## 2021-11-28 VITALS — BP 116/74 | HR 93 | Temp 98.0°F | Resp 16 | Ht 64.0 in | Wt 171.0 lb

## 2021-11-28 DIAGNOSIS — E039 Hypothyroidism, unspecified: Secondary | ICD-10-CM | POA: Diagnosis not present

## 2021-11-28 LAB — TSH: TSH: 2.32 u[IU]/mL (ref 0.35–5.50)

## 2021-11-28 NOTE — Progress Notes (Signed)
Subjective:  Patient ID: Kim Kim, female    DOB: [birth year]  Age: 66 y.o. MRN: [id number]  CC: Hypothyroidism  This visit occurred during the SARS-CoV-2 public health emergency.  Safety protocols were in place, including screening questions prior to the visit, additional usage of staff PPE, and extensive cleaning of exam room while observing appropriate contact time as indicated for disinfecting solutions.    HPI Kim Kim presents for f/up and to establish.  She has a history of hypothyroidism and is doing well on her current T4 supplement.  She is active and denies chest pain, shortness of breath, edema, or fatigue.  History Kim Kim has a past medical history of Allergy, Anemia, Carotid atherosclerosis, Epilepsy ([place]), Gastric ulcer, GERD (gastroesophageal reflux disease), Growth hormone deficiency ([place]), Hiatal hernia, Hiatal hernia, Hypothyroidism, Pituitary adenoma ([place]), Seizures ([place]), Thyroid disease, and Vitamin D deficiency.   She has a past surgical history that includes Esophagogastroduodenoscopy (06/03/2019); Colonoscopy (12/10/2015); and Wisdom tooth extraction.   Her family history includes Diabetes in her mother and sister; Hearing loss in her sister, sister, and sister; Heart disease in her brother, father, and mother; Hyperlipidemia in her father; Hypertension in her father, mother, sister, sister, and sister; Thyroid disease in her mother; Thyroid nodules in her mother.She reports that she has never smoked. She has never used smokeless tobacco. She reports that she does not currently use alcohol. She reports that she does not use drugs.  Outpatient Medications Prior to Visit  Medication Sig Dispense Refill   arginine 500 MG tablet Take 1 tablet (500 mg total) by mouth daily. 60 tablet 2   COVID-19 mRNA bivalent vaccine, Pfizer, (PFIZER COVID-19 VAC BIVALENT) injection Inject into the muscle. 0.3 mL 0   desonide (DESOWEN) 0.05 % cream Apply 1 application topically as  needed.     estradiol (ESTRACE) 0.1 MG/GM vaginal cream Place vaginally.     fluticasone (FLONASE) 50 MCG/ACT nasal spray Place into the nose daily as needed.     influenza vac split quadrivalent PF (FLUARIX) 0.5 ML injection Inject into the muscle. 0.5 mL 0   Kim Kim 100 MG tablet Take 1 tablet (100 mg total) by mouth 2 (two) times daily. 180 tablet 4   levothyroxine (SYNTHROID) 50 MCG tablet Take 50 mcg by mouth daily before breakfast. 75 Mcg every Sunday     omeprazole (PRILOSEC) 40 MG capsule Take 1 capsule (40 mg total) by mouth in the morning and at bedtime. Dispense as brand name Prilosec per patient request. 60 capsule 3   rosuvastatin (CRESTOR) 10 MG tablet Take 10 mg by mouth at bedtime.     No facility-administered medications prior to visit.    ROS Review of Systems  Constitutional:  Negative for chills, diaphoresis, fatigue and fever.  HENT: Negative.    Eyes: Negative.   Respiratory:  Negative for cough, shortness of breath and wheezing.   Cardiovascular:  Negative for chest pain, palpitations and leg swelling.  Gastrointestinal:  Negative for abdominal pain, constipation, diarrhea, nausea and vomiting.  Endocrine: Negative for cold intolerance and heat intolerance.  Genitourinary: Negative.   Musculoskeletal:  Negative for arthralgias and myalgias.  Neurological:  Negative for dizziness, weakness, light-headedness and headaches.  Hematological:  Negative for adenopathy. Does not bruise/bleed easily.  Psychiatric/Behavioral: Negative.     Objective:  BP 116/74 (BP Location: Left Arm, Patient Position: Sitting, Cuff Size: Large)    Pulse 93    Temp 98 F (36.7 C) (Oral)    Resp 16  Ht 5\' 4"  (1.626 m)    Wt 171 lb (77.6 kg)    SpO2 96%    BMI 29.35 kg/m   Physical Exam Vitals reviewed.  HENT:     Nose: Nose normal.     Mouth/Throat:     Mouth: Mucous membranes are moist.  Eyes:     General: No scleral icterus.    Conjunctiva/sclera: Conjunctivae normal.   Cardiovascular:     Rate and Rhythm: Normal rate and regular rhythm.     Heart sounds: No murmur heard. Pulmonary:     Effort: Pulmonary effort is normal.     Breath sounds: No stridor. No wheezing, rhonchi or rales.  Abdominal:     General: Abdomen is flat.     Palpations: There is no mass.     Tenderness: There is no abdominal tenderness. There is no guarding.     Hernia: No hernia is present.  Musculoskeletal:     Cervical back: Neck supple.     Right lower leg: No edema.     Left lower leg: No edema.  Lymphadenopathy:     Cervical: No cervical adenopathy.  Skin:    General: Skin is warm and dry.  Neurological:     General: No focal deficit present.     Mental Status: She is alert.  Psychiatric:        Mood and Affect: Mood normal.        Behavior: Behavior normal.    Lab Results  Component Value Date   WBC 7.1 09/29/2020   HGB 12.0 09/29/2020   HCT 36.1 09/29/2020   PLT 242.0 09/29/2020   GLUCOSE 90 07/12/2021   ALT 17 07/14/2021   AST 25 07/14/2021   NA 139 07/14/2021   K 4.2 07/14/2021   CL 104 07/14/2021   CREATININE 0.9 07/14/2021   BUN 17 07/14/2021   CO2 26 (A) 07/14/2021   TSH 2.32 11/28/2021   HGBA1C 5.7 07/14/2021     Assessment & Plan:   Kim Kim was seen today for hypothyroidism.  Diagnoses and all orders for this visit:  Acquired hypothyroidism- Her TSH is in the normal range.  She will stay on the current T4 dosage. -     TSH; Future -     TSH   I am having Kim Kim maintain her levothyroxine, desonide, rosuvastatin, estradiol, fluticasone, omeprazole, arginine, Pfizer COVID-19 Vac Bivalent, influenza vac split quadrivalent PF, and Kim Kim.  No orders of the defined types were placed in this encounter.    Follow-up: Return in about 6 months (around 05/28/2022).  Scarlette Calico, MD

## 2021-11-28 NOTE — Patient Instructions (Signed)

## 2021-11-29 ENCOUNTER — Ambulatory Visit (INDEPENDENT_AMBULATORY_CARE_PROVIDER_SITE_OTHER): Payer: BC Managed Care – PPO | Admitting: Family Medicine

## 2021-11-29 ENCOUNTER — Ambulatory Visit: Payer: BC Managed Care – PPO | Admitting: Physical Therapy

## 2021-11-29 NOTE — Therapy (Signed)
OUTPATIENT PHYSICAL THERAPY TREATMENT NOTE   Patient Name: Kim Kim MRN: [id number] DOB:11/10/1955, 66 y.o., female Today's Date: 11/30/2021  PCP: Janith Lima, MD REFERRING PROVIDER: Stefanie Libel, MD   PT End of Session - 11/30/21 1415     Visit Number 6    Number of Visits 12    Date for PT Re-Evaluation 12/14/21    Authorization Type BCBS    PT Start Time 5035    PT Stop Time 1500    PT Time Calculation (min) 40 min    Activity Tolerance Patient tolerated treatment well    Behavior During Therapy WFL for tasks assessed/performed               Past Medical History:  Diagnosis Date   Allergy    seasonal allergies   Anemia    hx of   Carotid atherosclerosis    Epilepsy ([place])    Gastric ulcer    hx of   GERD (gastroesophageal reflux disease)    on meds   Growth hormone deficiency ([place])    Hiatal hernia    Hiatal hernia    Hypothyroidism    hx of Hashimoto thyroiditis   Pituitary adenoma ([place])    hx of   Seizures ([place])    temporal lobe   Thyroid disease    on meds   Vitamin D deficiency    Past Surgical History:  Procedure Laterality Date   COLONOSCOPY  12/10/2015   [place] Dr Meredith Leeds   ESOPHAGOGASTRODUODENOSCOPY  06/03/2019   Dr Celso Sickle, [place] EXTRACTION     Patient Active Problem List   Diagnosis Date Noted   Thoracic back pain 05/19/2020   Prediabetes 01/22/2020   Weight gain 01/22/2020   Osteopenia 01/22/2020   Osteoarthritis 07/17/2019   Family history of heart disease 12/11/2018   Atypical chest pain 12/11/2018   B12 deficiency 10/16/2017   Elevated BP without diagnosis of hypertension 10/01/2017   Osteoporosis 10/01/2017   Uterine fibroid 07/31/2017   History of cleft palate 11/22/2016   Velopharyngeal insufficiency (VPI), 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   Normocytic anemia 07/31/2016   Screen for colon cancer 07/31/2016   Iron deficiency 04/27/2016   Hypothyroidism 12/17/2012   Insulin resistance 12/17/2012    [place] 10/15/2012   Hashimoto's thyroiditis 07/24/2012   Hyperlipidemia 07/24/2012   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   Basal metabolism function study abnormality 03/07/2012   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   Dyspnea 07/09/2011   Pulmonary nodules 07/09/2011   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[place]) 46/56/8127   Dysmetabolic syndrome X 51/70/0174   Growth hormone deficiency ([place]) 03/28/2011   Low HDL (under 40) 03/28/2011   GERD (gastroesophageal reflux disease) 01/01/2009   Obesity 05/19/2008   Vitamin D deficiency 10/07/2007  PCP: Pcp, No   REFERRING PROVIDER: Stefanie Libel, MD   REFERRING DIAG: [phone] (ICD-10-CM) - Bilateral shoulder pain, unspecified chronicity R29.3 (ICD-10-CM) - Posture abnormality M54.9 (ICD-10-CM) - Upper back pain   THERAPY DIAG:  Abnormal posture  Muscle weakness (generalized)  Chronic right shoulder pain  Pain in thoracic spine  ONSET DATE: chronic   PERTINENT HISTORY: From Previous provider, Dr. Oneida Alar Patient is a 66 y.o. female here for follow-up of upper back pain and right knee OA.   Upper back pain/posterior shoulder pain - patient has dealt with this issue for the past few years.  Over the last 3 years she has been back and forth between [place] and here with moving boxes, in and out  of homes, helping care for her father.  She states during this process she noted some pain in the upper back and posterior shoulders.  Her right is worse than the left.  She will get pain with certain twisting motions or trying to put the arms overhead.  She saw Dr. Barbaraann Barthel for this on 09/14/2021, and was thought to have more pain in the trapezius and scapular stabilizers.  She had been sent to physical therapy and has been doing some aquatic therapy based exercises which have been helping to some extent.  She presents today to discuss home exercises and activities that she can do at the gym to help improve this pain and strengthen this.  She denies  any redness, swelling or bruising.  She feels like she has some stiffness and restricted range of motion with her UE movement.   PRECAUTIONS: None   WEIGHT BEARING RESTRICTIONS No   Subjective:   I was doing a lot of housework , cutting and had some pain with rotational motions.  It is her Rt mid back. Sore right now. She wants to take her AM aquatics class 3 x per week and she has not done this lately. Sees Dr. this month.    PAIN:  Are you having pain? yes VAS scale:5 /10 with movement  Pain location: Rt  post inferior shoulder Pain orientation: Right and Posterior  PAIN TYPE: soreness Pain description: intermittent and aching  Aggravating factors: moving, rotating  Relieving factors: rest, does not use heat , med     OBJECTIVE:    DIAGNOSTIC FINDINGS:  None recent    PATIENT SURVEYS:  FOTO 54%, 63%   COGNITION:          Overall cognitive status: Within functional limits for tasks assessed                               SENSATION:          Light touch: Appears intact          Stereognosis: Appears intact          Hot/Cold: Appears intact          Proprioception: Appears intact   POSTURE: Stands with forward head, upper crossed syndrome, rounded back   PALPATION: Tender, painful with gross palpation to bilateral upper back, periscapular area, upper trap.  Pain also in thoracic and lumbar paraspinals with light palpation.      UPPER EXTREMITY AROM/PROM:   A/PROM Right 11/02/2021 Left 11/02/2021 Rt 11/30/21  Shoulder flexion [facility] WFL stiff superior shoulder 135  Shoulder extension       Shoulder abduction 115 125 135  Shoulder adduction       Shoulder internal rotation FR to upper lumbar FR to mid T  Mid T  Shoulder external rotation FR to Rt T1 FR to Lt T1 T1  (Blank rows = not tested)   UPPER EXTREMITY MMT:   MMT Right 11/02/2021 Left 11/02/2021 Rt  11/30/21  Shoulder flexion 3/5 3/5 3+/5  Shoulder extension       Shoulder abduction 3/5 3/5 3/5  Shoulder  adduction       Middle trapezius 3/5 3+/5   Lower trapezius       Elbow flexion 4+/5 4+/5   Elbow extension 4/5 4/5   Wrist flexion       Wrist extension       Wrist ulnar deviation  Wrist radial deviation       Wrist pronation       Wrist supination       Grip strength (lbs) 20  24   (Blank rows = not tested)   Stiffness in Rt >Lt.  Internal rotation, posterior capsule  T spine hypomobile              [facility] Adult PT Treatment:                                                DATE: 11/30/21 Therapeutic Exercise: Prone scapular retraction x 10  Shoulder extension x 10  Shoulder horizontal abduction x 10  Goal post x 8 [place] x 5   Manual Therapy:  Rt scapular mobilization, IASTM lats, teres minor, lower trap, infraspinatus and rhomboids Sidelying and prone  Trigger point work as tolerated    [facility] Adult PT Treatment/Exercise:   1/24/23______________________________________________ Pilates Reformer used for LE/core strength, postural strength, lumbopelvic disassociation and core control.   Exercises included: Footwork 2 red 1 blue 1 yellow  Heels and toes  Heel raises in Pilates V   Single leg press out to hip circles  x 10   Supine Arm work 1 yellow 1 Red   Arcs  x 10 Circles x 10 each direction  Long box Prone  Pulling straps 1 red x 5 then 1 blue for better form , added T for horizontal abduction  Overhead press   Baby cobra first for thoracic extension x 8  tried several different springs, settled on 1 yellow for max comfort each UE   Seated Arms    Facing front serving and hug a tree 1 blue x 10 each   Facing back  1 red row x 10  horizontal abd 1 yellow  x10  External rotation yellow x 10     [facility] Adult PT Treatment/Exercise:   1/19/23___________________________________________   Therapeutic Exercise:  Standing Springboard  Roll down bar:  Extension x 10  Row x 10  Upper ab curl x 10  Row with yellow springs x 10 in mini squat hold  Palloff  press x 15 added small ROM rotation x 5  both sides   Supine arms  Arcs x 10   Arcs with bridge x 10 Trunk rotation x 3 reset   Lower abs bent knee lower lift x 10   Single leg arcs yellow x 10 each leg  Circles x 10 each direction Standing slastix bands  Chest press   Horizontal abd x 10   ER x 10   Diagonal pull x 10   Demo for HEP UE routine  : horizontal abd, row and ER /IR with blue band x 15 each , mod cues    Treatment: 11/15/21 _______________________________________________________________________________________________________ Pilates Reformer used for LE/core strength, postural strength, lumbopelvic disassociation and core control.  Exercises included:  Transverse abdominus activation, with ball squeeze, breathing patterning.  Footwork 2 red 1 blue 1 yellow  Heels, in parallel, turnout and wide turnout  Coordination of breathing with movement    Bridging same 4 springs heavy cues for spine articulation x 10  Supine Arm work 1 red  1 yellow Arcs in parallel and T shape x 8 each   Quadruped   Active Cat camel   Round back 1 red LE only  x 10  Mermaid 1 blue  X 6  then spine extension with rotation , spiral hip stretching   Press out with long box 1 blue   Added rotation for T spine      PATIENT EDUCATION: Education details: PT/POC, HEP, posture, pilates Person educated: Patient Education method: Explanation Education comprehension: verbalized understanding and needs further education     HOME EXERCISE PROGRAM: Access Code: [email or web address] URL: [email or web address] Date: 11/02/2021 Prepared by: Raeford Razor   ASSESSMENT:   CLINICAL IMPRESSION:  Patient with decreased rotation in thoracic spine bilateral, significant tension and trigger points throughout Rt posterior shoulder ribcage and paraspinals.  We discussed POC further and she definitely need manual therapy not just Pilates exercises. She was hesitant to do dry needling before  research.  Cont POC.    REHAB POTENTIAL: Excellent   CLINICAL DECISION MAKING: Stable/uncomplicated   EVALUATION COMPLEXITY: Low     GOALS: Goals reviewed with patient? No   LONG TERM GOALS:    LTG Name Target Date Goal status  1 Pt will be able to show [place] with HEP for strength and  Baseline: 12/14/2021 In progress   2 Pt will increase strength in bilateral UEs to 4/5 or better to support upper body activities Baseline:3/5 to 3+/5 12/14/21 In progress  3 Pt will be able to notice no more than min increased pain, discomfort with rotational movements, use of UE and trunk Baseline:moderate  12/14/2021 In progress  4 Pt will be able to self correct for standing and seated postures without cues and be more aware throughout the day  Baseline: 12/14/2021 In progress   5 Pt will improve FOTO score to 70% or better in each body part  Baseline: 54% (S), 63% (T) 12/14/2021 In progress                       PLAN: PT FREQUENCY: 1-2x/week 1-2x/week PT DURATION: 6 weeks   PLANNED INTERVENTIONS: Therapeutic exercises, Therapeutic activity, Neuro Muscular re-education, Balance training, Gait training, Patient/Family education, Joint mobilization, Dry Needling, Spinal mobilization, Cryotherapy, Moist heat, Manual therapy, and Pilates based strength   PLAN FOR NEXT SESSION:   MANUAL therapy.  T-rotation   Raeford Razor, PT 11/30/21 4:08 PM Phone: [phone] Fax: [phone]

## 2021-11-30 ENCOUNTER — Other Ambulatory Visit: Payer: Self-pay

## 2021-11-30 ENCOUNTER — Ambulatory Visit: Payer: BC Managed Care – PPO | Attending: Sports Medicine | Admitting: Physical Therapy

## 2021-11-30 DIAGNOSIS — M546 Pain in thoracic spine: Secondary | ICD-10-CM | POA: Diagnosis not present

## 2021-11-30 DIAGNOSIS — M25561 Pain in right knee: Secondary | ICD-10-CM | POA: Diagnosis not present

## 2021-11-30 DIAGNOSIS — R293 Abnormal posture: Secondary | ICD-10-CM | POA: Diagnosis not present

## 2021-11-30 DIAGNOSIS — G8929 Other chronic pain: Secondary | ICD-10-CM | POA: Insufficient documentation

## 2021-11-30 DIAGNOSIS — M25511 Pain in right shoulder: Secondary | ICD-10-CM | POA: Diagnosis not present

## 2021-11-30 DIAGNOSIS — M6281 Muscle weakness (generalized): Secondary | ICD-10-CM | POA: Insufficient documentation

## 2021-11-30 NOTE — Patient Instructions (Signed)

## 2021-12-01 ENCOUNTER — Encounter: Payer: Self-pay | Admitting: Registered"

## 2021-12-01 NOTE — Progress Notes (Signed)
On 11/25/21 patient completed Core Session 1 of Diabetes Prevention Program course virtually with [facility]. The following learning objectives were met by the patient during this class:   Virtual Visit via Video Note  I connected with Kim Kim by a video enabled application and verified that I am speaking with the correct person using two identifiers.   I discussed the limitations of evaluation and management by telemedicine and the availability of in person appointments. The patient expressed understanding and agreed to proceed.  Location: Patient: Home.  Provider: Office.    Learning Objectives:  Be able to explain the purpose and benefits of the National Diabetes Prevention Program.  Be able to describe the events that will take place at every session.  Know the weight loss and physical activity goals established by the [facility] Diabetes Prevention Program.  Know their own individual weight loss and physical activity goals.  Be able to explain the important effect of self-monitoring on behavior change.   Goals:  Record food and beverage intake in "Food and Activity Tracker" over the next week.  E-mail completed "Food and Activity Tracker" to Lifestyle Coach next week before session 2. Circle the foods or beverages you think are highest in fat and calories in your food tracker. Read the labels on the food you buy, and consider using measuring cups and spoons to help you calculate the amount you eat. We will talk about measuring in more detail in the coming weeks.   Follow-Up Plan: Attend Core Session 2 next week.  E-mail completed "Food and Activity Tracker" to Lifestyle Coach next week before class.

## 2021-12-02 ENCOUNTER — Other Ambulatory Visit: Payer: Self-pay

## 2021-12-02 ENCOUNTER — Encounter: Payer: BC Managed Care – PPO | Attending: Cardiology | Admitting: Registered"

## 2021-12-02 ENCOUNTER — Ambulatory Visit: Payer: BC Managed Care – PPO | Admitting: Physical Therapy

## 2021-12-02 DIAGNOSIS — M25561 Pain in right knee: Secondary | ICD-10-CM

## 2021-12-02 DIAGNOSIS — M6281 Muscle weakness (generalized): Secondary | ICD-10-CM

## 2021-12-02 DIAGNOSIS — M25511 Pain in right shoulder: Secondary | ICD-10-CM | POA: Diagnosis not present

## 2021-12-02 DIAGNOSIS — R293 Abnormal posture: Secondary | ICD-10-CM | POA: Diagnosis not present

## 2021-12-02 DIAGNOSIS — G8929 Other chronic pain: Secondary | ICD-10-CM | POA: Diagnosis not present

## 2021-12-02 DIAGNOSIS — R7303 Prediabetes: Secondary | ICD-10-CM | POA: Insufficient documentation

## 2021-12-02 DIAGNOSIS — M546 Pain in thoracic spine: Secondary | ICD-10-CM

## 2021-12-02 NOTE — Therapy (Signed)
OUTPATIENT PHYSICAL THERAPY TREATMENT NOTE   Patient Name: Kim Kim MRN: [id number] DOB:Apr 16, 1956, 66 y.o., female Today's Date: 12/02/2021  PCP: Janith Lima, MD REFERRING PROVIDER: Stefanie Libel, MD   PT End of Session - 12/02/21 0934     Visit Number 7    Date for PT Re-Evaluation 12/14/21    Authorization Type BCBS    PT Start Time [phone]    PT Stop Time 1027    PT Time Calculation (min) 53 min    Activity Tolerance Patient tolerated treatment well    Behavior During Therapy WFL for tasks assessed/performed                Past Medical History:  Diagnosis Date   Allergy    seasonal allergies   Anemia    hx of   Carotid atherosclerosis    Epilepsy ([place])    Gastric ulcer    hx of   GERD (gastroesophageal reflux disease)    on meds   Growth hormone deficiency ([place])    Hiatal hernia    Hiatal hernia    Hypothyroidism    hx of Hashimoto thyroiditis   Pituitary adenoma ([place])    hx of   Seizures ([place])    temporal lobe   Thyroid disease    on meds   Vitamin D deficiency    Past Surgical History:  Procedure Laterality Date   COLONOSCOPY  12/10/2015   [place] Dr Meredith Leeds   ESOPHAGOGASTRODUODENOSCOPY  06/03/2019   Dr Celso Sickle, [place] EXTRACTION     Patient Active Problem List   Diagnosis Date Noted   Thoracic back pain 05/19/2020   Prediabetes 01/22/2020   Weight gain 01/22/2020   Osteopenia 01/22/2020   Osteoarthritis 07/17/2019   Family history of heart disease 12/11/2018   Atypical chest pain 12/11/2018   B12 deficiency 10/16/2017   Elevated BP without diagnosis of hypertension 10/01/2017   Osteoporosis 10/01/2017   Uterine fibroid 07/31/2017   History of cleft palate 11/22/2016   Velopharyngeal insufficiency (VPI), 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   Normocytic anemia 07/31/2016   Screen for colon cancer 07/31/2016   Iron deficiency 04/27/2016   Hypothyroidism 12/17/2012   Insulin resistance 12/17/2012   Auras 10/15/2012    Hashimoto's thyroiditis 07/24/2012   Hyperlipidemia 07/24/2012   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   Basal metabolism function study abnormality 03/07/2012   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   Dyspnea 07/09/2011   Pulmonary nodules 07/09/2011   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[place]) 92/08/9416   Dysmetabolic syndrome X 40/81/4481   Growth hormone deficiency ([place]) 03/28/2011   Low HDL (under 40) 03/28/2011   GERD (gastroesophageal reflux disease) 01/01/2009   Obesity 05/19/2008   Vitamin D deficiency 10/07/2007  PCP: Pcp, No   REFERRING PROVIDER: Stefanie Libel, MD   REFERRING DIAG: [phone] (ICD-10-CM) - Bilateral shoulder pain, unspecified chronicity R29.3 (ICD-10-CM) - Posture abnormality M54.9 (ICD-10-CM) - Upper back pain   THERAPY DIAG:  Abnormal posture  Muscle weakness (generalized)  Chronic right shoulder pain  Pain in thoracic spine  Chronic pain of right knee  ONSET DATE: chronic   PERTINENT HISTORY: From Previous provider, Dr. Oneida Alar Patient is a 66 y.o. female here for follow-up of upper back pain and right knee OA.   Upper back pain/posterior shoulder pain - patient has dealt with this issue for the past few years.  Over the last 3 years she has been back and forth between [place] and here with moving boxes, in and out  of homes, helping care for her father.  She states during this process she noted some pain in the upper back and posterior shoulders.  Her right is worse than the left.  She will get pain with certain twisting motions or trying to put the arms overhead.  She saw Dr. Barbaraann Barthel for this on 09/14/2021, and was thought to have more pain in the trapezius and scapular stabilizers.  She had been sent to physical therapy and has been doing some aquatic therapy based exercises which have been helping to some extent.  She presents today to discuss home exercises and activities that she can do at the gym to help improve this pain and strengthen this.  She  denies any redness, swelling or bruising.  She feels like she has some stiffness and restricted range of motion with her UE movement.   PRECAUTIONS: None   WEIGHT BEARING RESTRICTIONS No   Subjective:   I was sore after the last time it got better as the day went on .  I think I'm going to wait to do dry needling.    PAIN:  Are you having pain? yes VAS scale: 4/10 with movement  Pain location: Rt  post inferior shoulder Pain orientation: Right and Posterior  PAIN TYPE: soreness Pain description: intermittent and aching  Aggravating factors: moving, rotating  Relieving factors: rest, does not use heat , med     OBJECTIVE:    DIAGNOSTIC FINDINGS:  None recent    PATIENT SURVEYS:  FOTO 54%, 63%   COGNITION:          Overall cognitive status: Within functional limits for tasks assessed                               SENSATION:          Light touch: Appears intact          Stereognosis: Appears intact          Hot/Cold: Appears intact          Proprioception: Appears intact   POSTURE: Stands with forward head, upper crossed syndrome, rounded back   PALPATION: Tender, painful with gross palpation to bilateral upper back, periscapular area, upper trap.  Pain also in thoracic and lumbar paraspinals with light palpation.      UPPER EXTREMITY AROM/PROM:   A/PROM Right 11/02/2021 Left 11/02/2021 Rt 11/30/21  Shoulder flexion [facility] WFL stiff superior shoulder 135  Shoulder extension       Shoulder abduction 115 125 135  Shoulder adduction       Shoulder internal rotation FR to upper lumbar FR to mid T  Mid T  Shoulder external rotation FR to Rt T1 FR to Lt T1 T1  (Blank rows = not tested)   UPPER EXTREMITY MMT:   MMT Right 11/02/2021 Left 11/02/2021 Rt  11/30/21  Shoulder flexion 3/5 3/5 3+/5  Shoulder extension       Shoulder abduction 3/5 3/5 3/5  Shoulder adduction       Middle trapezius 3/5 3+/5   Lower trapezius       Elbow flexion 4+/5 4+/5   Elbow extension 4/5  4/5   Wrist flexion       Wrist extension       Wrist ulnar deviation       Wrist radial deviation       Wrist pronation       Wrist supination  Grip strength (lbs) 20  24   (Blank rows = not tested)   Stiffness in Rt >Lt.  Internal rotation, posterior capsule  T spine hypomobile             [facility] Adult PT Treatment:                                                DATE: 12/02/21 Therapeutic Exercise: UBE Level 1 , 4 min in reverse  Open book x 5 each side  Supine goal post ER/IR x 10  Supine overhead lift x 10 Quadruped scapular protract and retraction  Q-ped thoracic rotation  Supine foam roller thoracic extension 3 levels, multiple reps  Manual Therapy: Rt scapular mobilization, IASTM lats, teres minor, lower trap, infraspinatus and rhomboids Seated in massage chair  Sidelying scap work  Self Care: Soft tissue, DN and lat foam rolling   [facility] Adult PT Treatment:                                                DATE: 11/30/21 Therapeutic Exercise: Prone scapular retraction x 10  Shoulder extension x 10  Shoulder horizontal abduction x 10  Goal post x 8 Dart x 5   Manual Therapy:  Rt scapular mobilization, IASTM lats, teres minor, lower trap, infraspinatus and rhomboids Sidelying and prone  Trigger point work as tolerated    [facility] Adult PT Treatment/Exercise:   1/24/23______________________________________________ Pilates Reformer used for LE/core strength, postural strength, lumbopelvic disassociation and core control.   Exercises included: Footwork 2 red 1 blue 1 yellow  Heels and toes  Heel raises in Pilates V   Single leg press out to hip circles  x 10   Supine Arm work 1 yellow 1 Red   Arcs  x 10 Circles x 10 each direction  Long box Prone  Pulling straps 1 red x 5 then 1 blue for better form , added T for horizontal abduction  Overhead press   Baby cobra first for thoracic extension x 8  tried several different springs, settled on 1 yellow for max comfort  each UE   Seated Arms    Facing front serving and hug a tree 1 blue x 10 each   Facing back  1 red row x 10  horizontal abd 1 yellow  x10  External rotation yellow x 10     [facility] Adult PT Treatment/Exercise:   1/19/23___________________________________________   Therapeutic Exercise:  Standing Springboard  Roll down bar:  Extension x 10  Row x 10  Upper ab curl x 10  Row with yellow springs x 10 in mini squat hold  Palloff press x 15 added small ROM rotation x 5  both sides   Supine arms  Arcs x 10   Arcs with bridge x 10 Trunk rotation x 3 reset   Lower abs bent knee lower lift x 10   Single leg arcs yellow x 10 each leg  Circles x 10 each direction Standing slastix bands  Chest press   Horizontal abd x 10   ER x 10   Diagonal pull x 10   Demo for HEP UE routine  : horizontal abd, row and ER /IR with blue band x  15 each , mod cues    PATIENT EDUCATION: Education details: PT/POC, HEP, posture, pilates Person educated: Patient Education method: Explanation Education comprehension: verbalized understanding and needs further education     HOME EXERCISE PROGRAM: Access Code: [email or web address] URL: [email or web address] Date: 11/02/2021 Prepared by: Raeford Razor   ASSESSMENT:   CLINICAL IMPRESSION: Patient able to rotate throughout R shoulder and ribcage with less pain than prior to session.  Manual was less aggressive today.  Tension and myofacial pain throughout upper back and shoulder girdle.  Recommend she book a massage therapy session.  Will hold of for now on dry needling for trigger pints.    REHAB POTENTIAL: Excellent   CLINICAL DECISION MAKING: Stable/uncomplicated   EVALUATION COMPLEXITY: Low     GOALS: Goals reviewed with patient? No   LONG TERM GOALS:    LTG Name Target Date Goal status  1 Pt will be able to show Independence with HEP for strength and  Baseline: 12/14/2021 In progress   2 Pt will increase strength in bilateral UEs to  4/5 or better to support upper body activities Baseline:3/5 to 3+/5 12/14/21 In progress  3 Pt will be able to notice no more than min increased pain, discomfort with rotational movements, use of UE and trunk Baseline:moderate  12/14/2021 In progress  4 Pt will be able to self correct for standing and seated postures without cues and be more aware throughout the day  Baseline: 12/14/2021 In progress   5 Pt will improve FOTO score to 70% or better in each body part  Baseline: 54% (S), 63% (T) 12/14/2021 In progress                       PLAN: PT FREQUENCY: 1-2x/week 1-2x/week PT DURATION: 6 weeks   PLANNED INTERVENTIONS: Therapeutic exercises, Therapeutic activity, Neuro Muscular re-education, Balance training, Gait training, Patient/Family education, Joint mobilization, Dry Needling, Spinal mobilization, Cryotherapy, Moist heat, Manual therapy, and Pilates based strength   PLAN FOR NEXT SESSION:   Manual , upper back strength and posture, prone pilates     Raeford Razor, PT 12/02/21 10:37 AM Phone: [phone] Fax: [phone]

## 2021-12-02 NOTE — Progress Notes (Signed)
On 12/02/21 patient completed Core Session 2 of Diabetes Prevention Program course virtually with [facility]. The following learning objectives were met by the patient during this class:   Virtual Visit via Video Note  I connected with [name] by a video enabled application and verified that I am speaking with the correct person using two identifiers.   I discussed the limitations of evaluation and management by telemedicine and the availability of in person appointments. The patient expressed understanding and agreed to proceed.  Location: Patient: Home.  Provider: Office.   Learning Objectives: Self-monitor their weight during the weeks following Session 2.  Describe the relationship between fat and calories.  Explain the reason for, and basic principles of, self-monitoring fat grams and calories.  Identify their personal fat gram goals.  Use the ?Fat and Calorie Counter to calculate the calories and fat grams of a given selection of foods.  Keep a running total of the fat grams they eat each day.  Calculate fat, calories, and serving sizes from nutrition labels.   Goals:  Weigh yourself at the same time each day, or every few days, and record your weight in your Food and Activity Tracker. Write down everything you eat and drink in your Food and Activity Tracker. Measure portions as much as you can, and start reading labels.  Use the ?Fat and Calorie Counter to figure out the amount of fat and calories in what you ate, and write the amount down in your Food and Activity Tracker. Keep a running fat gram total throughout the day. Come as close to your fat gram goal as you can.   Follow-Up Plan: Attend Core Session 3 next week.  Email completed  "Food and Activity Tracker" to [facility] next week.

## 2021-12-05 ENCOUNTER — Encounter: Payer: Self-pay | Admitting: Physical Therapy

## 2021-12-05 ENCOUNTER — Ambulatory Visit: Payer: BC Managed Care – PPO | Admitting: Physical Therapy

## 2021-12-05 ENCOUNTER — Other Ambulatory Visit: Payer: Self-pay

## 2021-12-05 DIAGNOSIS — M25561 Pain in right knee: Secondary | ICD-10-CM | POA: Diagnosis not present

## 2021-12-05 DIAGNOSIS — R293 Abnormal posture: Secondary | ICD-10-CM | POA: Diagnosis not present

## 2021-12-05 DIAGNOSIS — M6281 Muscle weakness (generalized): Secondary | ICD-10-CM

## 2021-12-05 DIAGNOSIS — M546 Pain in thoracic spine: Secondary | ICD-10-CM

## 2021-12-05 DIAGNOSIS — G8929 Other chronic pain: Secondary | ICD-10-CM

## 2021-12-05 DIAGNOSIS — M25511 Pain in right shoulder: Secondary | ICD-10-CM | POA: Diagnosis not present

## 2021-12-05 NOTE — Therapy (Signed)
OUTPATIENT PHYSICAL THERAPY TREATMENT NOTE   Patient Name: Kim Kim MRN: [id number] DOB:November 19, 1955, 66 y.o., female Today's Date: 12/05/2021  PCP: Janith Lima, MD REFERRING PROVIDER: Stefanie Libel, MD   PT End of Session - 12/05/21 0849     Visit Number 8    Number of Visits 12    Date for PT Re-Evaluation 12/14/21    Authorization Type BCBS    PT Start Time 0847    PT Stop Time 0932    PT Time Calculation (min) 45 min    Activity Tolerance Patient tolerated treatment well    Behavior During Therapy WFL for tasks assessed/performed                 Past Medical History:  Diagnosis Date   Allergy    seasonal allergies   Anemia    hx of   Carotid atherosclerosis    Epilepsy ([place])    Gastric ulcer    hx of   GERD (gastroesophageal reflux disease)    on meds   Growth hormone deficiency ([place])    Hiatal hernia    Hiatal hernia    Hypothyroidism    hx of Hashimoto thyroiditis   Pituitary adenoma ([place])    hx of   Seizures ([place])    temporal lobe   Thyroid disease    on meds   Vitamin D deficiency    Past Surgical History:  Procedure Laterality Date   COLONOSCOPY  12/10/2015   [place] Dr Meredith Leeds   ESOPHAGOGASTRODUODENOSCOPY  06/03/2019   Dr Celso Sickle, [place] EXTRACTION     Patient Active Problem List   Diagnosis Date Noted   Thoracic back pain 05/19/2020   Prediabetes 01/22/2020   Weight gain 01/22/2020   Osteopenia 01/22/2020   Osteoarthritis 07/17/2019   Family history of heart disease 12/11/2018   Atypical chest pain 12/11/2018   B12 deficiency 10/16/2017   Elevated BP without diagnosis of hypertension 10/01/2017   Osteoporosis 10/01/2017   Uterine fibroid 07/31/2017   History of cleft palate 11/22/2016   Velopharyngeal insufficiency (VPI), 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   Normocytic anemia 07/31/2016   Screen for colon cancer 07/31/2016   Iron deficiency 04/27/2016   Hypothyroidism 12/17/2012   Insulin resistance  12/17/2012   Auras 10/15/2012   Hashimoto's thyroiditis 07/24/2012   Hyperlipidemia 07/24/2012   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   Basal metabolism function study abnormality 03/07/2012   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   Dyspnea 07/09/2011   Pulmonary nodules 07/09/2011   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[place]) 09/32/6712   Dysmetabolic syndrome X 45/80/9983   Growth hormone deficiency ([place]) 03/28/2011   Low HDL (under 40) 03/28/2011   GERD (gastroesophageal reflux disease) 01/01/2009   Obesity 05/19/2008   Vitamin D deficiency 10/07/2007  PCP: Pcp, No   REFERRING PROVIDER: Stefanie Libel, MD   REFERRING DIAG: [phone] (ICD-10-CM) - Bilateral shoulder pain, unspecified chronicity R29.3 (ICD-10-CM) - Posture abnormality M54.9 (ICD-10-CM) - Upper back pain   THERAPY DIAG:  Abnormal posture  Muscle weakness (generalized)  Chronic right shoulder pain  Pain in thoracic spine  Chronic pain of right knee  ONSET DATE: chronic   PERTINENT HISTORY: From Previous provider, Dr. Oneida Alar Patient is a 66 y.o. female here for follow-up of upper back pain and right knee OA.   Upper back pain/posterior shoulder pain - patient has dealt with this issue for the past few years.  Over the last 3 years she has been back and forth between [place]  and here with moving boxes, in and out of homes, helping care for her father.  She states during this process she noted some pain in the upper back and posterior shoulders.  Her right is worse than the left.  She will get pain with certain twisting motions or trying to put the arms overhead.  She saw Dr. Barbaraann Barthel for this on 09/14/2021, and was thought to have more pain in the trapezius and scapular stabilizers.  She had been sent to physical therapy and has been doing some aquatic therapy based exercises which have been helping to some extent.  She presents today to discuss home exercises and activities that she can do at the gym to help improve this  pain and strengthen this.  She denies any redness, swelling or bruising.  She feels like she has some stiffness and restricted range of motion with her UE movement.   PRECAUTIONS: None   WEIGHT BEARING RESTRICTIONS No   Subjective:    What you did last time really helped.  I did my stretches.  I want ot get a routine to where I don't have to look at my paper.   PAIN:  Are you having pain? yes VAS scale: 3/10 with movement  Pain location: Rt  post inferior shoulder Pain orientation: Right and Posterior  PAIN TYPE: soreness Pain description: intermittent and aching  Aggravating factors: moving, rotating  Relieving factors: rest, does not use heat , med     OBJECTIVE:    DIAGNOSTIC FINDINGS:  None recent    PATIENT SURVEYS:  FOTO 54%, 63%   COGNITION:          Overall cognitive status: Within functional limits for tasks assessed                               SENSATION:          Light touch: Appears intact          Stereognosis: Appears intact          Hot/Cold: Appears intact          Proprioception: Appears intact   POSTURE: Stands with forward head, upper crossed syndrome, rounded back   PALPATION: Tender, painful with gross palpation to bilateral upper back, periscapular area, upper trap.  Pain also in thoracic and lumbar paraspinals with light palpation.      UPPER EXTREMITY AROM/PROM:   A/PROM Right 11/02/2021 Left 11/02/2021 Rt 11/30/21  Shoulder flexion [facility] WFL stiff superior shoulder 135  Shoulder extension       Shoulder abduction 115 125 135  Shoulder adduction       Shoulder internal rotation FR to upper lumbar FR to mid T  Mid T  Shoulder external rotation FR to Rt T1 FR to Lt T1 T1  (Blank rows = not tested)   UPPER EXTREMITY MMT:   MMT Right 11/02/2021 Left 11/02/2021 Rt  11/30/21  Shoulder flexion 3/5 3/5 3+/5  Shoulder extension       Shoulder abduction 3/5 3/5 3/5  Shoulder adduction       Middle trapezius 3/5 3+/5   Lower trapezius        Elbow flexion 4+/5 4+/5   Elbow extension 4/5 4/5   Wrist flexion       Wrist extension       Wrist ulnar deviation       Wrist radial deviation       Wrist pronation  Wrist supination       Grip strength (lbs) 20  24   (Blank rows = not tested)   Stiffness in Rt >Lt.  Internal rotation, posterior capsule  T spine hypomobile            [facility] Adult PT Treatment:                                                DATE: 12/05/21 Therapeutic Exercise: UBE each direction for total of 5 min level 1 Sidelying Thoracic Rotation with Open Book  2 sets - 10 reps - 10 hold Shoulder External Rotation and Scapular Retraction with Resistance - 2 sets - 10 reps - 5 hold Seated Shoulder Horizontal Abduction with Resistance - Thumbs Up :2 sets - 10 reps - 5 hold Quadruped Thoracic Rotation - Reach Under  Quadruped Full Range Thoracic Rotation with Reach up  Thoracic Extension Mobilization on Foam Roll Latissimus Mobilization on Foam Roll  ON PHYSIOBALL: Prone Scapular Retraction: 1 sets - 10 reps - 5 hold Prone Scapular Slide with Shoulder Extension - 1-2 x daily - 7 x weekly - 2 sets - 10 reps - 5 hold Wall for posture :  Goal post wall angels x 10  Goal post openings x 10   Green looped band    Overhead lift x 5    Chest press x 10    [facility] Adult PT Treatment:                                                DATE: 12/02/21 Therapeutic Exercise: UBE Level 1 , 4 min in reverse  Open book x 5 each side  Supine goal post ER/IR x 10  Supine overhead lift x 10 Quadruped scapular protract and retraction  Q-ped thoracic rotation  Supine foam roller thoracic extension 3 levels, multiple reps  Manual Therapy: Rt scapular mobilization, IASTM lats, teres minor, lower trap, infraspinatus and rhomboids Seated in massage chair  Sidelying scap work  Self Care: Soft tissue, DN and lat foam rolling   [facility] Adult PT Treatment:                                                DATE: 11/30/21 Therapeutic  Exercise: Prone scapular retraction x 10  Shoulder extension x 10  Shoulder horizontal abduction x 10  Goal post x 8 Dart x 5   Manual Therapy:  Rt scapular mobilization, IASTM lats, teres minor, lower trap, infraspinatus and rhomboids Sidelying and prone  Trigger point work as tolerated    [facility] Adult PT Treatment/Exercise:   1/24/23______________________________________________ Pilates Reformer used for LE/core strength, postural strength, lumbopelvic disassociation and core control.   Exercises included: Footwork 2 red 1 blue 1 yellow  Heels and toes  Heel raises in Pilates V   Single leg press out to hip circles  x 10   Supine Arm work 1 yellow 1 Red   Arcs  x 10 Circles x 10 each direction  Long box Prone  Pulling straps 1 red x 5 then 1 blue for better form ,  added T for horizontal abduction  Overhead press   Baby cobra first for thoracic extension x 8  tried several different springs, settled on 1 yellow for max comfort each UE   Seated Arms    Facing front serving and hug a tree 1 blue x 10 each   Facing back  1 red row x 10  horizontal abd 1 yellow  x10  External rotation yellow x 10     [facility] Adult PT Treatment/Exercise:   1/19/23___________________________________________   Therapeutic Exercise:  Standing Springboard  Roll down bar:  Extension x 10  Row x 10  Upper ab curl x 10  Row with yellow springs x 10 in mini squat hold  Palloff press x 15 added small ROM rotation x 5  both sides   Supine arms  Arcs x 10   Arcs with bridge x 10 Trunk rotation x 3 reset   Lower abs bent knee lower lift x 10   Single leg arcs yellow x 10 each leg  Circles x 10 each direction Standing slastix bands  Chest press   Horizontal abd x 10   ER x 10   Diagonal pull x 10   Demo for HEP UE routine  : horizontal abd, row and ER /IR with blue band x 15 each , mod cues    PATIENT EDUCATION: Education details: PT/POC, HEP, posture, pilates Person educated:  Patient Education method: Explanation Education comprehension: verbalized understanding and needs further education     HOME EXERCISE PROGRAM: Access Code: [email or web address] URL: [email or web address] Date: 11/02/2021 Prepared by: Raeford Razor   ASSESSMENT:   CLINICAL IMPRESSION:  Pt is ready for final PT visit and HEP review.      REHAB POTENTIAL: Excellent   CLINICAL DECISION MAKING: Stable/uncomplicated   EVALUATION COMPLEXITY: Low     GOALS: Goals reviewed with patient? No   LONG TERM GOALS:    LTG Name Target Date Goal status  1 Pt will be able to show Independence with HEP for strength and  Baseline: 12/14/2021 In progress   2 Pt will increase strength in bilateral UEs to 4/5 or better to support upper body activities Baseline:3/5 to 3+/5 12/14/21 In progress  3 Pt will be able to notice no more than min increased pain, discomfort with rotational movements, use of UE and trunk Baseline:moderate  12/14/2021 In progress  4 Pt will be able to self correct for standing and seated postures without cues and be more aware throughout the day  Baseline: 12/14/2021 In progress   5 Pt will improve FOTO score to 70% or better in each body part  Baseline: 54% (S), 63% (T) 12/14/2021 In progress                       PLAN: PT FREQUENCY: 1-2x/week 1-2x/week PT DURATION: 6 weeks   PLANNED INTERVENTIONS: Therapeutic exercises, Therapeutic activity, Neuro Muscular re-education, Balance training, Gait training, Patient/Family education, Joint mobilization, Dry Needling, Spinal mobilization, Cryotherapy, Moist heat, Manual therapy, and Pilates based strength   PLAN FOR NEXT SESSION:  Check final HEP, DC, FOTO  Manual , upper back strength and posture, prone pilates     Raeford Razor, PT 12/05/21 9:35 AM Phone: [phone] Fax: [phone]

## 2021-12-07 ENCOUNTER — Ambulatory Visit: Payer: BC Managed Care – PPO | Admitting: Physical Therapy

## 2021-12-07 ENCOUNTER — Other Ambulatory Visit: Payer: Self-pay

## 2021-12-07 ENCOUNTER — Encounter: Payer: Self-pay | Admitting: Physical Therapy

## 2021-12-07 DIAGNOSIS — G8929 Other chronic pain: Secondary | ICD-10-CM

## 2021-12-07 DIAGNOSIS — R293 Abnormal posture: Secondary | ICD-10-CM | POA: Diagnosis not present

## 2021-12-07 DIAGNOSIS — M25561 Pain in right knee: Secondary | ICD-10-CM | POA: Diagnosis not present

## 2021-12-07 DIAGNOSIS — M546 Pain in thoracic spine: Secondary | ICD-10-CM

## 2021-12-07 DIAGNOSIS — M25511 Pain in right shoulder: Secondary | ICD-10-CM | POA: Diagnosis not present

## 2021-12-07 DIAGNOSIS — M6281 Muscle weakness (generalized): Secondary | ICD-10-CM | POA: Diagnosis not present

## 2021-12-07 NOTE — Therapy (Signed)
OUTPATIENT PHYSICAL THERAPY TREATMENT NOTE/DISCHARGE   Patient Name: Kim Kim MRN: [id number] DOB:April 17, 1956, 66 y.o., female Today's Date: 12/07/2021  PCP: Janith Lima, MD REFERRING PROVIDER: Stefanie Libel, MD   PT End of Session - 12/07/21 1433     Visit Number 9    Authorization Type BCBS    PT Start Time 1330    PT Stop Time 2774    PT Time Calculation (min) 48 min    Activity Tolerance Patient tolerated treatment well    Behavior During Therapy WFL for tasks assessed/performed                  Past Medical History:  Diagnosis Date   Allergy    seasonal allergies   Anemia    hx of   Carotid atherosclerosis    Epilepsy ([place])    Gastric ulcer    hx of   GERD (gastroesophageal reflux disease)    on meds   Growth hormone deficiency ([place])    Hiatal hernia    Hiatal hernia    Hypothyroidism    hx of Hashimoto thyroiditis   Pituitary adenoma ([place])    hx of   Seizures ([place])    temporal lobe   Thyroid disease    on meds   Vitamin D deficiency    Past Surgical History:  Procedure Laterality Date   COLONOSCOPY  12/10/2015   [place] Dr Meredith [place]   ESOPHAGOGASTRODUODENOSCOPY  06/03/2019   Dr Celso Sickle, [place] EXTRACTION     Patient Active Problem List   Diagnosis Date Noted   Thoracic back pain 05/19/2020   Prediabetes 01/22/2020   Weight gain 01/22/2020   Osteopenia 01/22/2020   Osteoarthritis 07/17/2019   Family history of heart disease 12/11/2018   Atypical chest pain 12/11/2018   B12 deficiency 10/16/2017   Elevated BP without diagnosis of hypertension 10/01/2017   Osteoporosis 10/01/2017   Uterine fibroid 07/31/2017   History of cleft palate 11/22/2016   Velopharyngeal insufficiency (VPI), 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   Normocytic anemia 07/31/2016   Screen for colon cancer 07/31/2016   Iron deficiency 04/27/2016   Hypothyroidism 12/17/2012   Insulin resistance 12/17/2012   [place] 10/15/2012   Hashimoto's thyroiditis  07/24/2012   Hyperlipidemia 07/24/2012   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   Basal metabolism function study abnormality 03/07/2012   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   Dyspnea 07/09/2011   Pulmonary nodules 07/09/2011   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[place]) 12/87/8676   Dysmetabolic syndrome X 72/06/4708   Growth hormone deficiency ([place]) 03/28/2011   Low HDL (under 40) 03/28/2011   GERD (gastroesophageal reflux disease) 01/01/2009   Obesity 05/19/2008   Vitamin D deficiency 10/07/2007  PCP: Pcp, No   REFERRING PROVIDER: Stefanie Libel, MD   REFERRING DIAG: [phone] (ICD-10-CM) - Bilateral shoulder pain, unspecified chronicity R29.3 (ICD-10-CM) - Posture abnormality M54.9 (ICD-10-CM) - Upper back pain   THERAPY DIAG:  Abnormal posture  Muscle weakness (generalized)  Chronic right shoulder pain  Pain in thoracic spine  Chronic pain of right knee  ONSET DATE: chronic   Subjective:    Just sore today.  L shoulder is hurting today too.  Still soreness with Rt UE motion and rotation.  Did do a water aerobics class and that felt good.   PAIN:  Are you having pain? yes VAS scale: 3/10 with movement  Pain location: Rt  post inferior shoulder Pain orientation: Right and Posterior  PAIN TYPE: soreness Pain description: intermittent and aching  Aggravating factors: moving, rotating  Relieving factors: rest, does not use heat , med     OBJECTIVE:    DIAGNOSTIC FINDINGS:  None recent    PATIENT SURVEYS:  FOTO 54%, 63% Emailed for discharge      UPPER EXTREMITY AROM/PROM:   A/PROM Right 11/02/2021 Left 11/02/2021 Rt 11/30/21  Shoulder flexion WFL WFL stiff superior shoulder 135  Shoulder extension       Shoulder abduction 115 125 135  Shoulder adduction       Shoulder internal rotation FR to upper lumbar FR to mid T  Mid T  Shoulder external rotation FR to Rt T1 FR to Lt T1 T1  (Blank rows = not tested)   UPPER EXTREMITY MMT:   MMT Right 11/02/2021  Left 11/02/2021 Rt  11/30/21 Rt/Lt. 12/07/21   Shoulder flexion 3/5 3/5 3+/5 3+/5  Shoulder extension        Shoulder abduction 3/5 3/5 3/5 3+/5  Shoulder adduction        Middle trapezius 3/5 3+/5    Lower trapezius        Elbow flexion 4+/5 4+/5    Elbow extension 4/5 4/5    Wrist flexion        Wrist extension        Wrist ulnar deviation        Wrist radial deviation        Wrist pronation        Wrist supination        Grip strength (lbs) 20  24 NT NT   (Blank rows = not tested)   Stiffness in Rt >Lt.  Internal rotation, posterior capsule  T spine hypomobile            [facility] Adult PT Treatment:                                                DATE: 12/07/21 Therapeutic Exercise: Seated row 30 lbs 2 ways x 15 reps  Lat pull down (seated) x 10 , 25 lbs  Diagonal pull 1 plate  x 10 1 plate  Thread the needle x 3 each side , 30 sec Bird dog x 5 UE and LE opposite Corner stretch 3 reps , 30 sec  Thoracic extension over foam roller multiple levels   Manual Therapy: Rt scapular mobilization, IASTM lats, teres minor, lower trap, infraspinatus and rhomboids Trigger point work as tolerated  Self Care: Stretch vs strength  Gym exercises, ex prescription  Posture and balancing with exercises       [facility] Adult PT Treatment:                                                DATE: 12/05/21 Therapeutic Exercise: UBE each direction for total of 5 min level 1 Sidelying Thoracic Rotation with Open Book  2 sets - 10 reps - 10 hold Shoulder External Rotation and Scapular Retraction with Resistance - 2 sets - 10 reps - 5 hold Seated Shoulder Horizontal Abduction with Resistance - Thumbs Up :2 sets - 10 reps - 5 hold Quadruped Thoracic Rotation - Reach Under  Quadruped Full Range Thoracic Rotation with Reach up  Thoracic Extension Mobilization on Foam Roll Latissimus Mobilization  on Foam Roll  ON PHYSIOBALL: Prone Scapular Retraction: 1 sets - 10 reps - 5 hold Prone Scapular Slide with Shoulder  Extension - 1-2 x daily - 7 x weekly - 2 sets - 10 reps - 5 hold Wall for posture :  Goal post wall angels x 10  Goal post openings x 10   Green looped band    Overhead lift x 5    Chest press x 10    [facility] Adult PT Treatment:                                                DATE: 12/07/21 PATIENT EDUCATION: Education details: PT/POC, HEP, posture, pilates Person educated: Patient Education method: Explanation Education comprehension: verbalized understanding and needs further education     HOME EXERCISE PROGRAM: Access Code: [email or web address] URL: [email or web address] Date: 11/02/2021 Prepared by: Raeford Razor   ASSESSMENT:   CLINICAL IMPRESSION:  Patient continues to have upper body weakness and myofascial pain . She continues to be very active and works on at [employer].  She did not feel ready to do Trigger point dry needling.  She will continue her HEP and see if she can resume regular therapeutic massage. She may try community based Pilates .  She reports needing accountability to reinforced the purpose of skilled PT vs maintenance and independent exercise. Discharge from PT.  FOTO emailed, unfortunately did not capture.     REHAB POTENTIAL: Excellent   CLINICAL DECISION MAKING: Stable/uncomplicated   EVALUATION COMPLEXITY: Low     GOALS: Goals reviewed with patient? No   LONG TERM GOALS:    LTG Name Target Date Goal status  1 Pt will be able to show Independence with HEP for strength and  Baseline: 12/14/2021 met   2 Pt will increase strength in bilateral UEs to 4/5 or better to support upper body activities Baseline:3/5 to 3+/5 12/14/21 Partially met   3 Pt will be able to notice no more than min increased pain, discomfort with rotational movements, use of UE and trunk Baseline:moderate  12/14/2021 Met   4 Pt will be able to self correct for standing and seated postures without cues and be more aware throughout the day  Baseline: 12/14/2021 Met   5 Pt will  improve FOTO score to 70% or better in each body part  Baseline: 54% (S), 63% (T) 12/14/2021 Emailed to PT , unknown                        PLAN: PT FREQUENCY: 1-2x/week 1-2x/week PT DURATION: 6 weeks   PLANNED INTERVENTIONS: Therapeutic exercises, Therapeutic activity, Neuro Muscular re-education, Balance training, Gait training, Patient/Family education, Joint mobilization, Dry Needling, Spinal mobilization, Cryotherapy, Moist heat, Manual therapy, and Pilates based strength   PLAN FOR NEXT SESSION:  DC, NA   PHYSICAL THERAPY DISCHARGE SUMMARY  Visits from Start of Care: 9  Current functional level related to goals / functional outcomes: See above    Remaining deficits: UE strength    Education / Equipment: HEP, posture, HEP, exercise principles and trigger point DN.   Patient agrees to discharge. Patient goals were partially met. Patient is being discharged due to maximized rehab potential.   Raeford Razor, PT 12/07/21 2:48 PM Phone: [phone] Fax: [phone]    Raeford Razor, PT 12/07/21 2:48 PM Phone: [phone] Fax: [phone]

## 2021-12-12 ENCOUNTER — Ambulatory Visit (INDEPENDENT_AMBULATORY_CARE_PROVIDER_SITE_OTHER): Payer: BC Managed Care – PPO | Admitting: Family Medicine

## 2021-12-12 ENCOUNTER — Encounter (INDEPENDENT_AMBULATORY_CARE_PROVIDER_SITE_OTHER): Payer: Self-pay | Admitting: Family Medicine

## 2021-12-12 ENCOUNTER — Other Ambulatory Visit: Payer: Self-pay

## 2021-12-12 VITALS — BP 94/61 | HR 81 | Temp 97.5°F | Ht 64.0 in | Wt 167.0 lb

## 2021-12-12 DIAGNOSIS — R7303 Prediabetes: Secondary | ICD-10-CM | POA: Diagnosis not present

## 2021-12-12 DIAGNOSIS — Z6828 Body mass index (BMI) 28.0-28.9, adult: Secondary | ICD-10-CM

## 2021-12-12 DIAGNOSIS — E669 Obesity, unspecified: Secondary | ICD-10-CM

## 2021-12-13 NOTE — Progress Notes (Signed)
Chief Complaint:   OBESITY Baileigh is here to discuss her progress with her obesity treatment plan along with follow-up of her obesity related diagnoses. Salma is on keeping a food journal and adhering to recommended goals of 1100-1200 calories and 75+ grams of protein daily and states she is following her eating plan approximately 75% of the time. Sharanda states she is doing water exercise for 60 minutes 4-5 times per week.  Today's visit was #: 7 Starting weight: 166 lbs Starting date: 07/12/2021 Today's weight: 167 lbs Today's date: 12/12/2021 Total lbs lost to date: 0 Total lbs lost since last in-office visit: 0  Interim History: Logann continues to exercise and she continues to increase muscle mass according to our bioimpedance scale. She notes an increase in emotional eating.   Subjective:   1. Pre-diabetes Aryahi is working on diet and weight loss. She has been on Glumetza in the past, but she wants to not take extra medications.  Assessment/Plan:   1. Pre-diabetes Auda is to continue her diet, exercise, weight loss and decreasing simple carbohydrates to help decrease the risk of diabetes. We will follow up with labs in the next 1-2 months.   2. Obesity with current BMI of 28.8 Tezra is currently in the action stage of change. As such, her goal is to continue with weight loss efforts. She has agreed to keeping a food journal and adhering to recommended goals of 1100 calories and 75+ grams of protein daily.   Exercise goals: As is.   Behavioral modification strategies: increasing lean protein intake and emotional eating strategies.  Gabryel has agreed to follow-up with our clinic in 4 weeks. She was informed of the importance of frequent follow-up visits to maximize her success with intensive lifestyle modifications for her multiple health conditions.   Objective:   Blood pressure 94/61, pulse 81, temperature (!) 97.5 F (36.4 C), height 5\' 4"  (1.626 m), weight 167 lb (75.8  kg), SpO2 98 %. Body mass index is 28.67 kg/m.  General: Cooperative, alert, well developed, in no acute distress. HEENT: Conjunctivae and lids unremarkable. Cardiovascular: Regular rhythm.  Lungs: Normal work of breathing. Neurologic: No focal deficits.   Lab Results  Component Value Date   CREATININE 0.9 07/14/2021   BUN 17 07/14/2021   NA 139 07/14/2021   K 4.2 07/14/2021   CL 104 07/14/2021   CO2 26 (A) 07/14/2021   Lab Results  Component Value Date   ALT 17 07/14/2021   AST 25 07/14/2021   ALKPHOS 112 07/14/2021   BILITOT 0.4 07/12/2021   Lab Results  Component Value Date   HGBA1C 5.7 07/14/2021   HGBA1C 6.1 09/06/2020   HGBA1C 5.8 01/07/2020   Lab Results  Component Value Date   INSULIN 9.5 07/12/2021   Lab Results  Component Value Date   TSH 2.32 11/28/2021   No results found for: CHOL, HDL, LDLCALC, LDLDIRECT, TRIG, CHOLHDL Lab Results  Component Value Date   VD25OH 36.99 09/06/2020   VD25OH 34.95 01/07/2020   Lab Results  Component Value Date   WBC 7.1 09/29/2020   HGB 12.0 09/29/2020   HCT 36.1 09/29/2020   MCV 80.8 09/29/2020   PLT 242.0 09/29/2020   Lab Results  Component Value Date   IRON 44 (L) 09/29/2020   TIBC 360 09/29/2020   FERRITIN 22.7 09/06/2020   Attestation Statements:   Reviewed by clinician on day of visit: allergies, medications, problem list, medical history, surgical history, family history, social history, and  previous encounter notes.  Time spent on visit including pre-visit chart review and post-visit care and charting was 35 minutes.    I, Trixie Dredge, am acting as transcriptionist for Dennard Nip, MD.  I have reviewed the above documentation for accuracy and completeness, and I agree with the above. -  Dennard Nip, MD

## 2021-12-16 ENCOUNTER — Encounter: Payer: BC Managed Care – PPO | Admitting: Registered"

## 2021-12-16 DIAGNOSIS — R7303 Prediabetes: Secondary | ICD-10-CM

## 2021-12-23 ENCOUNTER — Encounter: Payer: BC Managed Care – PPO | Admitting: Registered"

## 2021-12-23 DIAGNOSIS — R7303 Prediabetes: Secondary | ICD-10-CM

## 2021-12-27 ENCOUNTER — Encounter: Payer: Self-pay | Admitting: Registered"

## 2021-12-27 NOTE — Progress Notes (Signed)
On 12/16/21 patient completed Core Session 3 of Diabetes Prevention Program course virtually with [facility]. The following learning objectives were met by the patient during this class:   Virtual Visit via Video Note  I connected with Kim Kim by a video enabled application and verified that I am speaking with the correct person using two identifiers.  Location: Patient: Home.  Provider: Office.   Learning Objectives: Weigh and measure foods. Estimate the fat and calorie content of common foods. Describe three ways to eat less fat and fewer calories. Create a plan to eat less fat for the following week.   Goals:  Track weight when weighing outside of class.  Track food and beverages eaten each day in Food and Activity Tracker and include fat grams and calories for each.  Try to stay within fat gram goal.  Complete plan for eating less high fat foods and answer related homework questions.    Follow-Up Plan: Attend Core Session 4 next week.  Bring completed "Food and Activity Tracker" next week to be reviewed by Lifestyle Coach.

## 2021-12-28 ENCOUNTER — Telehealth: Payer: Self-pay | Admitting: Family Medicine

## 2021-12-28 ENCOUNTER — Encounter: Payer: Self-pay | Admitting: *Deleted

## 2021-12-28 NOTE — Telephone Encounter (Signed)
Pt states Leilani Able, RN gave her the name of a Thyroid Dr but she has misplaced the name.  Pt is asking for a call back with the name ?

## 2021-12-28 NOTE — Telephone Encounter (Signed)
Sent mychart

## 2021-12-30 ENCOUNTER — Encounter: Payer: BC Managed Care – PPO | Attending: Cardiology | Admitting: Registered"

## 2021-12-30 DIAGNOSIS — R7303 Prediabetes: Secondary | ICD-10-CM | POA: Insufficient documentation

## 2022-01-02 ENCOUNTER — Encounter (INDEPENDENT_AMBULATORY_CARE_PROVIDER_SITE_OTHER): Payer: Self-pay | Admitting: Family Medicine

## 2022-01-02 NOTE — Telephone Encounter (Signed)
Dr.Beasley 

## 2022-01-02 NOTE — Telephone Encounter (Signed)
Please see message and advise.  Thank you. ° °

## 2022-01-03 ENCOUNTER — Encounter: Payer: Self-pay | Admitting: Registered"

## 2022-01-03 NOTE — Progress Notes (Signed)
On 12/23/21 patient completed Core Session 4 of Diabetes Prevention Program course virtually with [facility]. The following learning objectives were met by the patient during this class:   Virtual Visit via Video Note  I connected with Kim Kim by a video enabled application and verified that I am speaking with the correct person using two identifiers.  I discussed the limitations of evaluation and management by telemedicine and the availability of in person appointments. The patient expressed understanding and agreed to proceed.  Location: Patient: Home.  Provider: Office.   Learning Objectives: Describe the MyPlate food guide and its recommendations, including how to reduce fat and calories in our diet. Compare and contrast MyPlate guidelines with participants eating habits. List ways to replace high-fat and high-calorie foods with low-fat and low-calorie foods. Explain the importance of eating plenty of whole grains, vegetables, and fruits, while staying within fat gram goals. Explain the importance of eating foods from all groups of MyPlate and of eating a variety of foods from within each group. Explain why a balanced diet is beneficial to health.  Goals:  Record weight taken outside of class.  Track foods and beverages eaten each day in the "Food and Activity Tracker," including calories and fat grams for each item.  Practice comparing what you eat with the recommendations of MyPlate using the "Rate Your Plate" handout.  Complete the "Rate Your Plate" handout form on at least 3 days.  Answer homework questions.   Follow-Up Plan: Attend Core Session 5 next week.  Email completed "Food and Activity Tracker" next week to be reviewed by Lifestyle Coach.

## 2022-01-03 NOTE — Progress Notes (Signed)
On 12/30/21 patient completed Core Session 5 of Diabetes Prevention Program course virtually with [facility]. The following learning objectives were met by the patient during this class:  ? ?Virtual Visit via Video Note ? ?I connected with Kim Kim by a video enabled application and verified that I am speaking with the correct person using two identifiers. ?  ?I discussed the limitations of evaluation and management by telemedicine and the availability of in person appointments. The patient expressed understanding and agreed to proceed. ? ?Location: ?Patient: Home.  ?Provider: Office.  ? ?Learning Objectives: ?Establish a physical activity goal. ?Explain the importance of the physical activity goal. ?Describe their current level of physical activity. ?Name ways that they are already physically active. ?Develop personal plans for physical activity for the next week.  ? ?Goals:  ?Record weight taken outside of class.  ?Track foods and beverages eaten each day in the "Food and Activity Tracker," including calories and fat grams for each item.  ?Make an Activity Plan including date, specific type of activity, and length of time you plan to be active that includes at last 60 minutes of activity for the week.  ?Track activity type, minutes you were active, and distance you reached each day in the "Food and Activity Tracker."  ? ?Follow-Up Plan: ?Attend Core Session 6 next week.  ?E-mail completed "Food and Activity Tracker" to Lifestyle Coach next week before class ? ?

## 2022-01-06 ENCOUNTER — Encounter: Payer: BC Managed Care – PPO | Admitting: Registered"

## 2022-01-06 DIAGNOSIS — R7303 Prediabetes: Secondary | ICD-10-CM

## 2022-01-10 ENCOUNTER — Encounter: Payer: Self-pay | Admitting: Registered"

## 2022-01-10 ENCOUNTER — Ambulatory Visit: Payer: BC Managed Care – PPO | Admitting: Sports Medicine

## 2022-01-10 NOTE — Progress Notes (Signed)
On 01/06/22 patient completed Core Session 6 of Diabetes Prevention Program course virtually with [facility]. The following learning objectives were met by the patient during this class:  ? ?Virtual Visit via Video Note ? ?I connected with [name] on 01/06/22 by a video enabled application and verified that I am speaking with the correct person using two identifiers. ?  ?I discussed the limitations of evaluation and management by telemedicine and the availability of in person appointments. The patient expressed understanding and agreed to proceed. ? ?Location: ?Patient: Home.  ?Provider: Office.  ? ?Learning Objectives: ?Graph their daily physical activity.  ?Describe two ways of finding the time to be active.  ?Define ?lifestyle activity.?  ?Describe how to prevent injury.  ?Develop an activity plan for the coming week.  ? ?Goals:  ?Record weight taken outside of class.  ?Track foods and beverages eaten each day in the "Food and Activity Tracker," including calories and fat grams for each item.   ?Track activity type, minutes you were active, and distance you reached each day in the "Food and Activity Tracker."  ?Set aside one 20 to 30-minute block of time every day or find two or more periods of 10 to15 minutes each for physical activity.  ?Warm up, cool down, and stretch. ?Make a Physical Activities Plan for the Week.  ? ?Follow-Up Plan: ?Attend Core Session 7 next week.  ?E-mail completed "Food and Activity Tracker" to Lifestyle Coach next week before class ? ?

## 2022-01-12 ENCOUNTER — Ambulatory Visit (INDEPENDENT_AMBULATORY_CARE_PROVIDER_SITE_OTHER): Payer: BC Managed Care – PPO | Admitting: Family Medicine

## 2022-01-13 ENCOUNTER — Encounter: Payer: Self-pay | Admitting: Registered"

## 2022-01-13 ENCOUNTER — Encounter: Payer: BC Managed Care – PPO | Admitting: Registered"

## 2022-01-13 DIAGNOSIS — R7303 Prediabetes: Secondary | ICD-10-CM

## 2022-01-13 NOTE — Progress Notes (Signed)
On 01/13/22 patient completed Core Session 7 of Diabetes Prevention Program course virtually with [facility]. The following learning objectives were met by the patient during this class:  ? ?Virtual Visit via Video Note ? ?I connected with [name] by a video enabled application and verified that I am speaking with the correct person using two identifiers. ? ?Location: ?Patient: Home.  ?Provider: Office.  ? ?Learning Objectives: ?Define calorie balance. ?Explain how healthy eating and being active are related in terms of calorie balance.  ?Describe the relationship between calorie balance and weight loss.  ?Describe his or her progress as it relates to calorie balance.  ?Develop an activity plan for the coming week.  ? ?Goals:  ?Record weight taken outside of class.  ?Track foods and beverages eaten each day in the "Food and Activity Tracker," including calories and fat grams for each item.   ?Track activity type, minutes you were active, and distance you reached each day in the "Food and Activity Tracker."  ?Set aside one 20 to 30-minute block of time every day or find two or more periods of 10 to15 minutes each for physical activity.  ?Make a Physical Activities Plan for the Week.  ?Make active lifestyle choices all through the day  ?Stay at or go slightly over activity goal.  ? ?Follow-Up Plan: ?Attend Core Session 8 next week.  ?E-mail completed "Food and Activity Tracker" to Lifestyle Coach next week before class ? ?

## 2022-01-16 DIAGNOSIS — K6389 Other specified diseases of intestine: Secondary | ICD-10-CM | POA: Diagnosis not present

## 2022-01-19 ENCOUNTER — Ambulatory Visit (INDEPENDENT_AMBULATORY_CARE_PROVIDER_SITE_OTHER): Payer: BC Managed Care – PPO | Admitting: Sports Medicine

## 2022-01-19 DIAGNOSIS — G8929 Other chronic pain: Secondary | ICD-10-CM | POA: Diagnosis not present

## 2022-01-19 DIAGNOSIS — R269 Unspecified abnormalities of gait and mobility: Secondary | ICD-10-CM

## 2022-01-19 DIAGNOSIS — M546 Pain in thoracic spine: Secondary | ICD-10-CM | POA: Diagnosis not present

## 2022-01-20 ENCOUNTER — Encounter: Payer: Self-pay | Admitting: Registered"

## 2022-01-20 ENCOUNTER — Encounter: Payer: BC Managed Care – PPO | Admitting: Registered"

## 2022-01-20 DIAGNOSIS — R7303 Prediabetes: Secondary | ICD-10-CM

## 2022-01-20 DIAGNOSIS — R269 Unspecified abnormalities of gait and mobility: Secondary | ICD-10-CM | POA: Insufficient documentation

## 2022-01-20 NOTE — Progress Notes (Signed)
On 01/20/22 patient completed Core Session 8 of Diabetes Prevention Program course virtually with [facility]. The following learning objectives were met by the patient during this class:  ? ?Virtual Visit via Video Note ? ?I connected with Kim Kim by a video enabled application and verified that I am speaking with the correct person using two identifiers. ? ?Location: ?Patient: Home. ?Provider: Office.  ? ?Learning Objectives: ?Recognize positive and negative food and activity cues.  ?Change negative food and activity cues to positive cues.  ?Add positive cues for activity and eliminate cues for inactivity.  ?Develop a plan for removing one problem food cue for the coming week.  ? ?Goals:  ?Record weight taken outside of class.  ?Track foods and beverages eaten each day in the "Food and Activity Tracker," including calories and fat grams for each item.   ?Track activity type, minutes you were active, and distance you reached each day in the "Food and Activity Tracker."  ?Set aside one 20 to 30-minute block of time every day or find two or more periods of 10 to15 minutes each for physical activity.  ?Remove one problem food cue.  ?Add one positive cue for being more active. ? ?Follow-Up Plan: ?Attend Core Session 9 next week.  ?Email completed "Food and Activity Tracker" next week to be reviewed by Lifestyle Coach. ? ?

## 2022-01-20 NOTE — Assessment & Plan Note (Signed)
I suspect her lower leg symptoms may be made worse by her foot posture ?We provided her with sports insoles ?Some additional arch support with this ? ?See if this helps resolve her lower leg pain ?

## 2022-01-20 NOTE — Progress Notes (Signed)
Abnormal gait chief complaint follow-up bilateral shoulder pain ? ?Patient was having significant bilateral shoulder pain ?Given a series of exercises ?She saw [facility] physical therapy and also had some Pilates ?Shoulder pain is at least 40% better ?She actually feels the strength is better ? ?Secondary problem she gets some tingling in both lower legs toward the end of the day ?This does not really feel numb ?Not a lot of pain but sometimes feels uncomfortable ?No restless leg ?No nighttime issues ? ?Physical exam ?Pleasant white female in no acute distress ?BP 124/82   Ht '5\' 4"'$  (1.626 m)   Wt 166 lb (75.3 kg)   BMI 28.49 kg/m?  ? ?Bilateral shoulders show full range of motion ?Her posture is improved ?The amount of shoulder protraction is less ?Strength is good ? ?On lower extremities there are some superficial varicosities on the anterior shin more on the right than the left leg ?She has flattening of both the transverse and longitudinal arch with pronation ? ?

## 2022-01-20 NOTE — Assessment & Plan Note (Signed)
We have given her a series of exercises to improve her posture ?We want her to improve and lessen the amount of shoulder protraction ?She is making excellent progress with better posture and less pain ?I suggested she continue at [place] bridge with some of the exercise classes and Pilates if possible ?

## 2022-01-23 ENCOUNTER — Other Ambulatory Visit: Payer: Self-pay

## 2022-01-23 ENCOUNTER — Ambulatory Visit (INDEPENDENT_AMBULATORY_CARE_PROVIDER_SITE_OTHER): Payer: BC Managed Care – PPO | Admitting: Family Medicine

## 2022-01-23 ENCOUNTER — Encounter (INDEPENDENT_AMBULATORY_CARE_PROVIDER_SITE_OTHER): Payer: Self-pay | Admitting: Family Medicine

## 2022-01-23 VITALS — BP 108/73 | HR 83 | Temp 97.9°F | Ht 64.0 in | Wt 167.0 lb

## 2022-01-23 DIAGNOSIS — E669 Obesity, unspecified: Secondary | ICD-10-CM | POA: Diagnosis not present

## 2022-01-23 DIAGNOSIS — E8881 Metabolic syndrome: Secondary | ICD-10-CM

## 2022-01-23 DIAGNOSIS — Z6828 Body mass index (BMI) 28.0-28.9, adult: Secondary | ICD-10-CM | POA: Diagnosis not present

## 2022-01-24 DIAGNOSIS — R202 Paresthesia of skin: Secondary | ICD-10-CM | POA: Diagnosis not present

## 2022-01-25 NOTE — Progress Notes (Signed)
? ? ? ?Chief Complaint:  ? ?OBESITY ?Kim Kim is here to discuss her progress with her obesity treatment plan along with follow-up of her obesity related diagnoses. Kim Kim is on keeping a food journal and adhering to recommended goals of 1100 calories and 75+ grams of protein daily and states she is following her eating plan approximately 50% of the time. Kim Kim states she is doing water exercise, and walking for 60 minutes 7 times per week. ? ?Today's visit was #: 8 ?Starting weight: 166 lbs ?Starting date: 07/12/2021 ?Today's weight: 167 lbs ?Today's date: 01/23/2022 ?Total lbs lost to date: 0 ?Total lbs lost since last in-office visit: 0 ? ?Interim History: Kim Kim is working on journaling on and off. It appears that she is getting approximately 50% of her protein goals which is limiting her ability to lose weight and keep it off. ? ?Subjective:  ? ?1. Insulin resistance ?Kim Kim would like to avoid medications if possible and control with diet. She has many questions about nutrition and ideal macro ranges, the importance of protein to help prevent diabetes mellitus, and improve metabolism. ? ?Assessment/Plan:  ? ?1. Insulin resistance ?Kim Kim's questions were answered in depth today. She will continue to work on diet, exercise, and weight loss. ? ?2. Obesity with current BMI of 28.7 ?Kim Kim is currently in the action stage of change. As such, her goal is to continue with weight loss efforts. She has agreed to keeping a food journal and adhering to recommended goals of 1100-1200 calories and 75+ grams of protein daily.  ? ?Exercise goals: As is. ? ?Behavioral modification strategies: increasing lean protein intake. ? ?Kim Kim has agreed to follow-up with our clinic in 4 weeks. She was informed of the importance of frequent follow-up visits to maximize her success with intensive lifestyle modifications for her multiple health conditions.  ? ?Objective:  ? ?Blood pressure 108/73, pulse 83, temperature 97.9 ?F (36.6 ?C), height  '5\' 4"'$  (1.626 m), weight 167 lb (75.8 kg), SpO2 97 %. ?Body mass index is 28.67 kg/m?. ? ?General: Cooperative, alert, well developed, in no acute distress. ?HEENT: Conjunctivae and lids unremarkable. ?Cardiovascular: Regular rhythm.  ?Lungs: Normal work of breathing. ?Neurologic: No focal deficits.  ? ?Lab Results  ?Component Value Date  ? CREATININE 0.9 07/14/2021  ? BUN 17 07/14/2021  ? NA 139 07/14/2021  ? K 4.2 07/14/2021  ? CL 104 07/14/2021  ? CO2 26 (A) 07/14/2021  ? ?Lab Results  ?Component Value Date  ? ALT 17 07/14/2021  ? AST 25 07/14/2021  ? ALKPHOS 112 07/14/2021  ? BILITOT 0.4 07/12/2021  ? ?Lab Results  ?Component Value Date  ? HGBA1C 5.7 07/14/2021  ? HGBA1C 6.1 09/06/2020  ? HGBA1C 5.8 01/07/2020  ? ?Lab Results  ?Component Value Date  ? INSULIN 9.5 07/12/2021  ? ?Lab Results  ?Component Value Date  ? TSH 2.32 11/28/2021  ? ?No results found for: CHOL, HDL, LDLCALC, LDLDIRECT, TRIG, CHOLHDL ?Lab Results  ?Component Value Date  ? VD25OH 36.99 09/06/2020  ? VD25OH 34.95 01/07/2020  ? ?Lab Results  ?Component Value Date  ? WBC 7.1 09/29/2020  ? HGB 12.0 09/29/2020  ? HCT 36.1 09/29/2020  ? MCV 80.8 09/29/2020  ? PLT 242.0 09/29/2020  ? ?Lab Results  ?Component Value Date  ? IRON 44 (L) 09/29/2020  ? TIBC 360 09/29/2020  ? FERRITIN 22.7 09/06/2020  ? ?Attestation Statements:  ? ?Reviewed by clinician on day of visit: allergies, medications, problem list, medical history, surgical history, family history, social  history, and previous encounter notes. ? ?Time spent on visit including pre-visit chart review and post-visit care and charting was 42 minutes.  ? ? ?I, Trixie Dredge, am acting as transcriptionist for Dennard Nip, MD. ? ?I have reviewed the above documentation for accuracy and completeness, and I agree with the above. -  Dennard Nip, MD ? ? ?

## 2022-01-27 ENCOUNTER — Encounter: Payer: BC Managed Care – PPO | Admitting: Registered"

## 2022-01-27 DIAGNOSIS — R7303 Prediabetes: Secondary | ICD-10-CM

## 2022-01-30 ENCOUNTER — Telehealth: Payer: Self-pay

## 2022-01-30 NOTE — Telephone Encounter (Signed)
Pt called in requesting a f/u appt with MD and to discuss tingling/fluttery feelings in legs. Pt has seen PCP and was advised to f/u with our clinic on this issue. Appointment made on 4/20 at 300 pm ?

## 2022-02-02 ENCOUNTER — Encounter: Payer: Self-pay | Admitting: Registered"

## 2022-02-02 NOTE — Progress Notes (Signed)
On 01/27/22 patient completed Core Session 9 of Diabetes Prevention Program course virtually with [facility]. The following learning objectives were met by the patient during this class:  ? ?Virtual Visit via Video Note ? ?I connected with Kim Kim by a video enabled application and verified that I am speaking with the correct person using two identifiers. ? ?Location: ?Patient: Home.  ?Provider: Office.  ? ?Learning Objectives: ?List and describe five steps to problem solving.  ?Apply the five problem solving steps to resolve a problem he or she has with eating less fat and fewer calories or being more active.  ? ?Goals:  ?Record weight taken outside of class.  ?Track foods and beverages eaten each day in the "Food and Activity Tracker," including calories and fat grams for each item.   ?Track activity type, minutes you were active, and distance you reached each day in the "Food and Activity Tracker."  ?Set aside one 20 to 30-minute block of time every day or find two or more periods of 10 to15 minutes each for physical activity.  ?Use problem solving action plan created during session to problem solve.  ? ?Follow-Up Plan: ?Attend Core Session 10 next week.  ?Email completed "Food and Activity Tracker" next week to be reviewed by Lifestyle Coach. ?Email menus from favorite restaurants to next session for future discussion.  ? ?

## 2022-02-10 ENCOUNTER — Encounter: Payer: BC Managed Care – PPO | Attending: Cardiology | Admitting: Registered"

## 2022-02-10 DIAGNOSIS — R7303 Prediabetes: Secondary | ICD-10-CM | POA: Insufficient documentation

## 2022-02-14 ENCOUNTER — Encounter: Payer: Self-pay | Admitting: *Deleted

## 2022-02-16 ENCOUNTER — Encounter: Payer: Self-pay | Admitting: Diagnostic Neuroimaging

## 2022-02-16 ENCOUNTER — Ambulatory Visit (INDEPENDENT_AMBULATORY_CARE_PROVIDER_SITE_OTHER): Payer: BC Managed Care – PPO | Admitting: Diagnostic Neuroimaging

## 2022-02-16 VITALS — BP 129/84 | HR 91 | Ht 64.0 in | Wt 172.0 lb

## 2022-02-16 DIAGNOSIS — G2581 Restless legs syndrome: Secondary | ICD-10-CM | POA: Diagnosis not present

## 2022-02-16 DIAGNOSIS — R200 Anesthesia of skin: Secondary | ICD-10-CM

## 2022-02-16 NOTE — Progress Notes (Signed)
? ? ?Chief Complaint  ?Patient presents with  ? Tingling in legs  ?  Rm 7 Est patient with new issue "almost like a vibration in my legs, numbness, more in evening/night, compression socks didn't help; doppler was negative"  ? ? ?HISTORY OF PRESENT ILLNESS: ? ?UPDATE (02/16/22, VRP): Since last visit, doing well, except here for evaluation of abnl sensation in feet, legs since 2019 or earlier. Restless legs, in evenings. Symptoms are intermittent, not daily. ? ?UPDATE (12/30/19, VRP): Since last visit, doing well. Some reports of memory loss. Some stress. Also asking about intermittent eye pressure issues when laying face down with massages. No alleviating or aggravating factors. Tolerating lamictal '100mg'$  twice a day. Mild spells have resolved.   ?  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 ?  ?In patient's 33s or 45s she was having recurrent dream, feeling like she was a little girl a swing in an old house.  Patient was having intermittent episodes of this strange feeling in her head which she had a hard time describing.  Sometimes she would feel lightheadedness and faint.  In her 8s she had an episode at home where she almost passed out.  In 2007 she had an episode where apparently she turned pale in color and almost fainted.  No d?j? vu sensations.  She had 1 out of body sensation in high school. ?  ?In 2013 patient went to see a neurologist, had EEG which showed some left temporal lobe dysfunction but no clear epileptiform discharges.  Patient was started on topiramate but this caused personality changes and side effects.  She saw another neurologist who started patient on lamotrigine and this seemed to help suppress these episodes.  Patient did well for many years.  She had gone from having 2-3 episodes a month down to no episodes. ?  ?In 2020 patient moved from [place] to [place] and was under increased stress during Covid pandemic and relocation.  In the last few  months she has noticed some mild strain sensations in her head which remind her of her prior spells.  She feels a lightheadedness and strangeness in her head.  No convulsions or loss of consciousness.  Patient has been stable on lamotrigine 75 in the morning and 100 at night.  Now patient having approximately 2-3 episodes per week.  Episodes can last a few minutes at a time. ?  ?No prior head trauma, meningitis or encephalitis.  No family history of seizures. ? ? ?REVIEW OF SYSTEMS: Negative as per HPI.  ? ? ?ALLERGIES: ?No Known Allergies ? ? ?HOME MEDICATIONS: ?Outpatient Medications Prior to Visit  ?Medication Sig Dispense Refill  ? arginine 500 MG tablet Take 1 tablet (500 mg total) by mouth daily. 60 tablet 2  ? desonide (DESOWEN) 0.05 % cream Apply 1 application topically as needed.    ? estradiol (ESTRACE) 0.1 MG/GM vaginal cream Place vaginally.    ? fluticasone (FLONASE) 50 MCG/ACT nasal spray Place into the nose daily as needed.    ? influenza vac split quadrivalent PF (FLUARIX) 0.5 ML injection Inject into the muscle. 0.5 mL 0  ? LAMICTAL 100 MG tablet Take 1 tablet (100 mg total) by mouth 2 (two) times daily. 180 tablet 4  ? levothyroxine (SYNTHROID) 50 MCG tablet Take 50 mcg by mouth daily before breakfast. 75 Mcg every Sunday    ? omeprazole (PRILOSEC) 40 MG capsule Take 1 capsule (40 mg total) by mouth in the morning and at  bedtime. Dispense as brand name Prilosec per patient request. 60 capsule 3  ? rosuvastatin (CRESTOR) 10 MG tablet Take 10 mg by mouth at bedtime.    ? COVID-19 mRNA bivalent vaccine, Pfizer, (PFIZER COVID-19 VAC BIVALENT) injection Inject into the muscle. (Patient not taking: Reported on 02/16/2022) 0.3 mL 0  ? ?No facility-administered medications prior to visit.  ? ? ? ?PHYSICAL EXAM ? ?GENERAL EXAM/CONSTITUTIONAL: ?Vitals:  ?Vitals:  ? 02/16/22 1424  ?BP: 129/84  ?Pulse: 91  ?Weight: 172 lb (78 kg)  ?Height: '5\' 4"'$  (1.626 m)  ? ?Body mass index is 29.52 kg/m?. ?Wt Readings from  Last 3 Encounters:  ?02/16/22 172 lb (78 kg)  ?01/23/22 167 lb (75.8 kg)  ?01/19/22 166 lb (75.3 kg)  ? ?Patient is in no distress; well developed, nourished and groomed; neck is supple ? ?CARDIOVASCULAR: ?Examination of carotid arteries is normal; no carotid bruits ?Regular rate and rhythm, no murmurs ?Examination of peripheral vascular system by observation and palpation is normal ? ?EYES: ?Ophthalmoscopic exam of optic discs and posterior segments is normal; no papilledema or hemorrhages ?No results found. ? ?MUSCULOSKELETAL: ?Gait, strength, tone, movements noted in Neurologic exam below ? ?NEUROLOGIC: ?MENTAL STATUS:  ?   ? View : No data to display.  ?  ?  ?  ? ?awake, alert, oriented to person, place and time ?recent and remote memory intact ?normal attention and concentration ?language fluent, comprehension intact, naming intact ?fund of knowledge appropriate ? ?CRANIAL NERVE:  ?2nd - no papilledema on fundoscopic exam ?2nd, 3rd, 4th, 6th - pupils equal and reactive to light, visual fields full to confrontation, extraocular muscles intact, no nystagmus ?5th - facial sensation symmetric ?7th - facial strength symmetric ?8th - hearing intact ?9th - palate elevates symmetrically, uvula midline ?11th - shoulder shrug symmetric ?12th - tongue protrusion midline ? ?MOTOR:  ?normal bulk and tone, full strength in the BUE, BLE ? ?SENSORY:  ?normal and symmetric to light touch, temperature, vibration ? ?COORDINATION:  ?finger-nose-finger, fine finger movements normal ? ?REFLEXES:  ?deep tendon reflexes TRACE and symmetric ? ?GAIT/STATION:  ?narrow based gait ? ? ? ?DIAGNOSTIC DATA (LABS, IMAGING, TESTING) ?- I reviewed patient records, labs, notes, testing and imaging myself where available. ? ?Lab Results  ?Component Value Date  ? WBC 7.1 09/29/2020  ? HGB 12.0 09/29/2020  ? HCT 36.1 09/29/2020  ? MCV 80.8 09/29/2020  ? PLT 242.0 09/29/2020  ? ?   ?Component Value Date/Time  ? NA 139 07/14/2021 0000  ? K 4.2  07/14/2021 0000  ? CL 104 07/14/2021 0000  ? CO2 26 (A) 07/14/2021 0000  ? GLUCOSE 90 07/12/2021 0948  ? BUN 17 07/14/2021 0000  ? CREATININE 0.9 07/14/2021 0000  ? CREATININE 0.99 07/12/2021 0948  ? CALCIUM 9.8 07/12/2021 0948  ? PROT 7.9 07/12/2021 0948  ? ALBUMIN 5.1 (H) 07/12/2021 0948  ? AST 25 07/14/2021 0000  ? ALT 17 07/14/2021 0000  ? ALKPHOS 112 07/14/2021 0000  ? BILITOT 0.4 07/12/2021 0948  ? GFRNONAA 73 07/14/2021 0000  ? ?No results found for: CHOL, HDL, LDLCALC, LDLDIRECT, TRIG, CHOLHDL ?Lab Results  ?Component Value Date  ? HGBA1C 5.7 07/14/2021  ? ?Lab Results  ?Component Value Date  ? [id number] 535 07/12/2021  ? ?Lab Results  ?Component Value Date  ? TSH 2.32 11/28/2021  ? ? ? ? ?ASSESSMENT AND PLAN ? ?66 y.o. year old female  has a past medical history of Allergy, Anemia, Carotid atherosclerosis, Epilepsy ([place]), Gastric ulcer, GERD (  gastroesophageal reflux disease), Growth hormone deficiency ([place]), Hiatal hernia, Hypothyroidism, Pituitary adenoma ([place]), Seizures ([place]), Thyroid disease, and Vitamin D deficiency. here with  ? ? ?Dx: ? ?Numbness - Plan: Iron, TIBC and Ferritin Panel, Vitamin B12, Vitamin B6 ? ?RLS (restless legs syndrome) - Plan: Iron, TIBC and Ferritin Panel, Vitamin B12, Vitamin B6 ? ?   - likely mild RLS, vs nerve irritation ? ?- Behavioral strategies ??Mental alerting activities, such as working on a computer or doing crossword puzzles, at times of rest or boredom ??Avoidance of aggravating factors, including consideration of withdrawal of possibly predisposing medications ??Moderate regular exercise ??A trial of abstinence from caffeine and alcohol ??For symptomatic relief - walking, bicycling, soaking the affected limbs, and leg massage, including pneumatic compression ? ?Avoidance of aggravating factors ??Insufficient sleep - Sleep deprivation is known to aggravate symptoms of restless legs syndrome (RLS) in many patients, and general principles of sleep hygiene should be  reviewed.  ? ?Orders Placed This Encounter  ?Procedures  ? Iron, TIBC and Ferritin Panel  ? Vitamin B12  ? Vitamin B6  ? ?Return for pending if symptoms worsen or fail to improve. ? ? ?Penni Bombard, MD 4/20/2

## 2022-02-16 NOTE — Patient Instructions (Addendum)
?  Behavioral strategies ??Mental alerting activities, such as working on a computer or doing crossword puzzles, at times of rest or boredom ??Avoidance of aggravating factors, including consideration of withdrawal of possibly predisposing medications ??Moderate regular exercise ??A trial of abstinence from caffeine and alcohol ??For symptomatic relief - walking, bicycling, soaking the affected limbs, and leg massage, including pneumatic compression ? ?Avoidance of aggravating factors ??Insufficient sleep - Sleep deprivation is known to aggravate symptoms of restless legs syndrome (RLS) in many patients, and general principles of sleep hygiene should be reviewed.  ?

## 2022-02-17 ENCOUNTER — Encounter: Payer: Self-pay | Admitting: Registered"

## 2022-02-17 ENCOUNTER — Encounter: Payer: BC Managed Care – PPO | Admitting: Registered"

## 2022-02-17 DIAGNOSIS — R7303 Prediabetes: Secondary | ICD-10-CM

## 2022-02-17 NOTE — Progress Notes (Signed)
On 02/10/22 patient completed Core Session 10 of Diabetes Prevention Program course virtually with [facility]. The following learning objectives were met by the patient during this class:  ? ?Virtual Visit via Video Note ? ?I connected with [name] by a video enabled application and verified that I am speaking with the correct person using two identifiers. ? ?Location: ?Patient: Home.  ?Provider: Office.  ? ?Learning Objectives: ?List and describe the four keys for healthy eating out.  ?Give examples of how to apply these keys at the type of restaurants that the participants go to regularly.  ?Make an appropriate meal selection from a restaurant menu.  ?Demonstrate how to ask for a substitute item using assertive language and a polite tone of voice.   ? ?Goals:  ?Record weight taken outside of class.  ?Track foods and beverages eaten each day in the "Food and Activity Tracker," including calories and fat grams for each item.   ?Track activity type, minutes you were active, and distance you reached each day in the "Food and Activity Tracker."  ?Set aside one 20 to 30-minute block of time every day or find two or more periods of 10 to15 minutes each for physical activity.  ?Utilize positive action plan and complete questions on "To Do List."  ? ?Follow-Up Plan: ?Attend Core Session 11.  ?Email completed "Food and Activity Tracker" to be reviewed by Lifestyle Coach. ? ?

## 2022-02-20 ENCOUNTER — Ambulatory Visit (INDEPENDENT_AMBULATORY_CARE_PROVIDER_SITE_OTHER): Payer: BC Managed Care – PPO | Admitting: Family Medicine

## 2022-02-21 DIAGNOSIS — E782 Mixed hyperlipidemia: Secondary | ICD-10-CM | POA: Diagnosis not present

## 2022-02-21 DIAGNOSIS — L84 Corns and callosities: Secondary | ICD-10-CM | POA: Diagnosis not present

## 2022-02-21 DIAGNOSIS — M79641 Pain in right hand: Secondary | ICD-10-CM | POA: Diagnosis not present

## 2022-02-21 DIAGNOSIS — M8580 Other specified disorders of bone density and structure, unspecified site: Secondary | ICD-10-CM | POA: Diagnosis not present

## 2022-02-21 DIAGNOSIS — E038 Other specified hypothyroidism: Secondary | ICD-10-CM | POA: Diagnosis not present

## 2022-02-22 DIAGNOSIS — E039 Hypothyroidism, unspecified: Secondary | ICD-10-CM | POA: Diagnosis not present

## 2022-02-22 DIAGNOSIS — R7302 Impaired glucose tolerance (oral): Secondary | ICD-10-CM | POA: Diagnosis not present

## 2022-02-22 DIAGNOSIS — E559 Vitamin D deficiency, unspecified: Secondary | ICD-10-CM | POA: Diagnosis not present

## 2022-02-24 DIAGNOSIS — R0982 Postnasal drip: Secondary | ICD-10-CM | POA: Diagnosis not present

## 2022-02-24 DIAGNOSIS — R490 Dysphonia: Secondary | ICD-10-CM | POA: Diagnosis not present

## 2022-02-24 DIAGNOSIS — Z9189 Other specified personal risk factors, not elsewhere classified: Secondary | ICD-10-CM | POA: Insufficient documentation

## 2022-02-24 DIAGNOSIS — J351 Hypertrophy of tonsils: Secondary | ICD-10-CM | POA: Insufficient documentation

## 2022-02-24 DIAGNOSIS — J3089 Other allergic rhinitis: Secondary | ICD-10-CM | POA: Diagnosis not present

## 2022-02-24 DIAGNOSIS — J392 Other diseases of pharynx: Secondary | ICD-10-CM | POA: Diagnosis not present

## 2022-02-24 LAB — IRON,TIBC AND FERRITIN PANEL
Ferritin: 36 ng/mL (ref 15–150)
Iron Saturation: 13 % — ABNORMAL LOW (ref 15–55)
Iron: 42 ug/dL (ref 27–139)
Total Iron Binding Capacity: 333 ug/dL (ref 250–450)
UIBC: 291 ug/dL (ref 118–369)

## 2022-02-24 LAB — VITAMIN B12: Vitamin B-12: 1100 pg/mL (ref 232–1245)

## 2022-02-24 LAB — VITAMIN B6: Vitamin B6: 9.8 ug/L (ref 3.4–65.2)

## 2022-02-27 ENCOUNTER — Encounter: Payer: Self-pay | Admitting: Registered"

## 2022-02-27 NOTE — Progress Notes (Signed)
On 02/17/22 patient completed Session 11 of Diabetes Prevention Program course virtually with [facility]. By the end of this session patients are able to complete the following objectives:  ? ?Virtual Visit via Video Note ? ?I connected with Kim Kim by a video enabled application and verified that I am speaking with the correct person using two identifiers. ? ?Location: ?Patient: Home. ?Provider: Office.  ? ?Learning Objectives: ?Give examples of negative thoughts that could prevent them from meeting their goals of losing weight and being more physically active.  ?Describe how to stop negative thoughts and talk back to them with positive thoughts.  ?Practice 1) stopping negative thoughts and 2) talking back to negative thoughts with positive ones.  ? ? ?Goals:  ?Record weight taken outside of class.  ?Track foods and beverages eaten each day in the "Food and Activity Tracker," including calories and fat grams for each item.   ?Track activity type, minutes you were active, and distance you reached each day in the "Food and Activity Tracker."  ?If you have any negative thoughts-write them in your Food and Activity Trackers, along with how you talked back to them. Practice stopping negative thoughts and talking back to them with positive thoughts.  ? ?Follow-Up Plan: ?Attend Core Session 12 next week.  ?Email completed "Food and Activity Tracker" before next week to be reviewed by Lifestyle Coach. ? ?

## 2022-03-01 ENCOUNTER — Ambulatory Visit (INDEPENDENT_AMBULATORY_CARE_PROVIDER_SITE_OTHER): Payer: BC Managed Care – PPO | Admitting: Family Medicine

## 2022-03-03 ENCOUNTER — Encounter: Payer: Self-pay | Admitting: Registered"

## 2022-03-03 ENCOUNTER — Encounter: Payer: BC Managed Care – PPO | Attending: Cardiology | Admitting: Registered"

## 2022-03-03 DIAGNOSIS — R7303 Prediabetes: Secondary | ICD-10-CM | POA: Insufficient documentation

## 2022-03-03 NOTE — Progress Notes (Signed)
On 03/03/22 patient attended a virtual grocery store tour session as part of the Diabetes Prevention Program with [facility]. ? ?Virtual Visit via Video Note ? ?I connected with [name] by a video enabled application and verified that I am speaking with the correct person using two identifiers. ? ?Location: ?Patient: Home.  ?Provider: Office.  ? ? Learning Objectives: ?Develop a plan for our grocery shopping experience ?Putting together a list ?How to navigate the grocery store ?Identify 4 main sections of the grocery store ?Produce ?Meat/Poultry/Fish ?Dairy  ?Inside Aisles ?Consider tips for shopping in each of these four sections ?Reflect on our own shopping habits ?Create a new goal for our next grocery shopping experience ?Engage in a group discussion ? ?Goals:  ?Record weight taken outside of class.  ?Track foods and beverages eaten each day in the "Food and Activity Tracker," including calories and fat grams for each item.  ?Create one new goal for the next grocery shopping experience based on the information provided today ? ?Follow-Up Plan: ?Attend next session.  ?Email completed "Food and Activity Tracker" before next session to be reviewed by Lifestyle Coach.  ? ?

## 2022-03-13 ENCOUNTER — Ambulatory Visit (INDEPENDENT_AMBULATORY_CARE_PROVIDER_SITE_OTHER): Payer: BC Managed Care – PPO | Admitting: Family Medicine

## 2022-03-13 DIAGNOSIS — R7303 Prediabetes: Secondary | ICD-10-CM

## 2022-03-13 DIAGNOSIS — E669 Obesity, unspecified: Secondary | ICD-10-CM

## 2022-03-13 DIAGNOSIS — E663 Overweight: Secondary | ICD-10-CM

## 2022-03-13 NOTE — Patient Instructions (Addendum)
-   Snacking: Serve a portion on a plate; go back for second helping if desired, but do not serve family-style.   ? ?Goals:  ?1. Incorporate some resistance training at least 2 X wk.  Schedule with a Physiological scientist at [employer].   ? ?2. Obtain at least servings of vegetables per day.   ?  Corporate treasurer for food demo recipes and tofu sour cream recipe.) ?   With a high-protein diet, it becomes even more important to eat plenty of fruits and vegetables, which buffer the acidity of a high-protein diet, thereby sparing calcium that otherwise gets pulled out of the bones and excreted.   ? ?(For low bone density: [facility] on [address].) ? ?Document progress on your above goals.   ? ?Protein foods: Meat, fish, poultry, eggs, dairy foods, and beans such as pinto and kidney beans (beans also provide carbohydrate), soy foods such as tofu, tempeh, and edamame, soy milk, soy "meats."   ? ?- Complete at least 7 dinner meal plans that you can use as your "default" meals.  One objective is to include at least half the volume of the meal as vegetables.   ? ?- You can do an IT consultant for [employer] or Mediterranean diet.   ? ?- Follow-up appt:  June 15 at 3:30 PM. ?  ?

## 2022-03-13 NOTE — Progress Notes (Signed)
Medical Nutrition Therapy ?Appt start time: 1430 end time: 1530 (1 hour) ?Primary concerns today: Weight management and overall health and nutrition .  ? ?Relevant history/background: Kim Kim wants to lose wt & control BG.  Dx's incl pre-DM, HLD, low HDL, osteoporosis, Fe-defic anemia, and low RMR (assessed by Dennard Nip, MD at [place]).  Also has SIBO for which she sees a functional physician remotely in [place]  every 2-3 mo.  Bowel function has improved in the past several months.   ? ?Assessment:  Kim Kim and her husband Gene moved to [place] from [place] right before [place] pandemic.  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  Doesn't yet have a strong social network here.  Significantly, she is getting together tomorrow evening to discuss herb gardening with some women she knows from [employer].  Kim Kim sees Dr. Leafy Ro at [facility], who has recommended 1100 kcal/day and 25 g protein at each meal.   Her MI physician has recommended foods to avoid, but has not advised about foods to increase.   ? ?Learning Readiness:  Change in progress; currently taking the Diabetes Prevention Program through [place] [facility] and seeing Dr. Leafy Ro at [facility]. ? ?Weight: 174.2 lb (Ht is 64") ?Usual eating pattern: 3 meals and 0-1 snacks per day.  Usually hungry ~mid-morning and mid-day.  Often gets up ~5:30 AM, but eats ~9.   ?Frequent foods and beverages: water, hot tea; berries, fruit, scrmbld eggs, bread, chx.   ?Avoided foods: Most dair y. ?Usual physical activity: 4 X wk various 45-min classes: Research officer, political party, yoga, yoga Pilates, water aerobics.  Walks ~45 min sporadically.     ?Sleep: 6-8 hrs/night. Is using Aura ring to help with sleep, which tracks body temperature and other biometrics.   ? ?24-hr recall: ?(Up at 10 AM) ?B (11 AM)-   2 eggs, pita bread, <1/2 c mixed berries, 1 c coffee, 1 tbsp sk milk, 1/2 tsp  sugar ?Snk ( AM)-   --- ?L (2:30 PM)-  3.5 oz filet mignon, 1/4 c grn beans, 1/2 c spinach, 1/2 c mashed potatoes, 1 cocktail, 1 small cream puff, ice crm, hot fudge, water ?Snk (6 PM)-  2 glasses champagne, berries ?D ( PM)-  Cheese & crackers ?Snk ( PM)-  water ?Typical day? No. Celebrated Mother's Day.  Usually gets up early; has breakfast ~9 AM, lunch mid-day; dinner usually protein, carb, veg's at 5 or 6.   ?Today's food intake:  ?(Up at 6 AM) ?B (AM)-  --- ?Snk (10 AM)-  1 boiled egg, 1 mango, coffee, 1 tbsp sk milk, 1/2 tsp sugar ?L (12 PM)-  1/4 c mashed potatoes. 2 oz filet mignon, asparagus, coffee, 1 tbsp sk milk, 1/2 tsp sugar ?Snk (1 PM)-  3 mini peppers, 1 choc bar ?D ( PM)-   ?Snk ( PM)-   ?Typical day? Yes.  , so far.   ?Nutritional Diagnosis:  ?NI-5.8.5 Inadeqate fiber intake As related to insufficient vegetables.  As evidenced by daily vegetable intake, although not in quantities that will best nourish healthy gut microbes and allow for optimal satiation on a low-kcal diet. ? ?Handouts given during visit include: ?After-Visit Summary (AVS) ?Goals Sheet ? ?Demonstrated degree of understanding via:  Teach Back  ?Barriers to learning/adherence to lifestyle change: Low RMR (as measured at [facility]) requires low energy intake for weight loss.   ? ?Monitoring/Evaluation:  Dietary intake, exercise, and body weight in 4 week(s). ? ?

## 2022-03-24 ENCOUNTER — Encounter: Payer: BC Managed Care – PPO | Admitting: Registered"

## 2022-03-24 ENCOUNTER — Encounter: Payer: Self-pay | Admitting: Registered"

## 2022-03-24 DIAGNOSIS — R7303 Prediabetes: Secondary | ICD-10-CM

## 2022-03-24 NOTE — Progress Notes (Signed)
On 03/24/22 patient completed the Core Session 13 of Diabetes Prevention Program course virtually with [facility]. By the end of this session patients are able to complete the following objectives:   Virtual Visit via Video Note  I connected with [name] by a video enabled application and verified that I am speaking with the correct person using two identifiers.  Location: Patient: Home.  Provider: Office.   Learning Objectives: Describe ways to add interest and variety to their activity plans. Define ?aerobic fitness. Explain the four F.I.T.T. principles (frequency, intensity, time, and type of activity) and how they relate to aerobic fitness.   Goals:  Record weight taken outside of class.  Track foods and beverages eaten each day in the "Food and Activity Tracker," including calories and fat grams for each item.   Track activity type, minutes you were active, and distance you reached each day in the "Food and Activity Tracker."  Do your best to reach activity goal for the week. Use one of the F.I.T.T. principles to jump start workouts. Document activity level on the "To Do Next Week" handout.  Follow-Up Plan: Attend Core Session 14 next week.  Email completed "Food and Activity Tracker" before next week to be reviewed by Lifestyle Coach.

## 2022-03-31 ENCOUNTER — Encounter: Payer: BC Managed Care – PPO | Attending: Internal Medicine | Admitting: Registered"

## 2022-03-31 DIAGNOSIS — R7303 Prediabetes: Secondary | ICD-10-CM | POA: Insufficient documentation

## 2022-04-03 ENCOUNTER — Encounter (INDEPENDENT_AMBULATORY_CARE_PROVIDER_SITE_OTHER): Payer: Self-pay | Admitting: Family Medicine

## 2022-04-03 ENCOUNTER — Ambulatory Visit (INDEPENDENT_AMBULATORY_CARE_PROVIDER_SITE_OTHER): Payer: BC Managed Care – PPO | Admitting: Family Medicine

## 2022-04-03 VITALS — BP 115/79 | HR 68 | Temp 97.6°F | Ht 64.0 in | Wt 167.0 lb

## 2022-04-03 DIAGNOSIS — R7303 Prediabetes: Secondary | ICD-10-CM | POA: Diagnosis not present

## 2022-04-03 DIAGNOSIS — E669 Obesity, unspecified: Secondary | ICD-10-CM | POA: Diagnosis not present

## 2022-04-03 DIAGNOSIS — Z6828 Body mass index (BMI) 28.0-28.9, adult: Secondary | ICD-10-CM

## 2022-04-04 DIAGNOSIS — Z85828 Personal history of other malignant neoplasm of skin: Secondary | ICD-10-CM | POA: Diagnosis not present

## 2022-04-04 DIAGNOSIS — D2271 Melanocytic nevi of right lower limb, including hip: Secondary | ICD-10-CM | POA: Diagnosis not present

## 2022-04-04 DIAGNOSIS — R198 Other specified symptoms and signs involving the digestive system and abdomen: Secondary | ICD-10-CM | POA: Diagnosis not present

## 2022-04-04 DIAGNOSIS — D1801 Hemangioma of skin and subcutaneous tissue: Secondary | ICD-10-CM | POA: Diagnosis not present

## 2022-04-04 DIAGNOSIS — D225 Melanocytic nevi of trunk: Secondary | ICD-10-CM | POA: Diagnosis not present

## 2022-04-05 NOTE — Progress Notes (Signed)
Chief Complaint:   OBESITY Kim Kim is here to discuss her progress with her obesity treatment plan along with follow-up of her obesity related diagnoses. Kim Kim is on keeping a food journal and adhering to recommended goals of 1100-1200 calories and 75+ grams of protein daily and states she is following her eating plan approximately 75-80% of the time. Kim Kim states she is at the gym for 60-120 minutes 5 times per week.  Today's visit was #: 9 Starting weight: 166 lbs Starting date: 07/12/2021 Today's weight: 167 lbs Today's date: 04/03/2022 Total lbs lost to date: 0 Total lbs lost since last in-office visit: 0  Interim History: Kim Kim is exercising regularly and she is gaining some muscle mass. She is working on increasing protein to approximately 75 grams. She is being mindful of her food choices but she is still adding some healthier fats and her simple carbohydrates are above goal. She has multiple questions on nutrition today.   Subjective:   1. Pre-diabetes Kim Kim continues to work on her diet and weight loss. She has questions about her HgA1c, diabetes mellitus risk, and nutrition specific to pre-diabetes.   Assessment/Plan:   1. Pre-diabetes Kim Kim's questions were answered, and she will work on decreasing her simple carbohydrates, portion control, and increase protein to help decrease the risk of diabetes.   2. Obesity with current BMI of 28.8 Kim Kim is currently in the action stage of change. As such, her goal is to continue with weight loss efforts. She has agreed to keeping a food journal and adhering to recommended goals of 1100-1200 calories and 75+ grams of protein daily.   Exercise goals: As is.   Behavioral modification strategies: increasing lean protein intake and keeping a strict food journal.  Kim Kim has agreed to follow-up with our clinic in 3 weeks. She was informed of the importance of frequent follow-up visits to maximize her success with intensive lifestyle  modifications for her multiple health conditions.   Objective:   Blood pressure 115/79, pulse 68, temperature 97.6 F (36.4 C), height '5\' 4"'$  (1.626 m), weight 167 lb (75.8 kg), SpO2 97 %. Body mass index is 28.67 kg/m.  General: Cooperative, alert, well developed, in no acute distress. HEENT: Conjunctivae and lids unremarkable. Cardiovascular: Regular rhythm.  Lungs: Normal work of breathing. Neurologic: No focal deficits.   Lab Results  Component Value Date   CREATININE 0.9 07/14/2021   BUN 17 07/14/2021   NA 139 07/14/2021   K 4.2 07/14/2021   CL 104 07/14/2021   CO2 26 (A) 07/14/2021   Lab Results  Component Value Date   ALT 17 07/14/2021   AST 25 07/14/2021   ALKPHOS 112 07/14/2021   BILITOT 0.4 07/12/2021   Lab Results  Component Value Date   HGBA1C 5.7 07/14/2021   HGBA1C 6.1 09/06/2020   HGBA1C 5.8 01/07/2020   Lab Results  Component Value Date   INSULIN 9.5 07/12/2021   Lab Results  Component Value Date   TSH 2.32 11/28/2021   No results found for: CHOL, HDL, LDLCALC, LDLDIRECT, TRIG, CHOLHDL Lab Results  Component Value Date   VD25OH 36.99 09/06/2020   VD25OH 34.95 01/07/2020   Lab Results  Component Value Date   WBC 7.1 09/29/2020   HGB 12.0 09/29/2020   HCT 36.1 09/29/2020   MCV 80.8 09/29/2020   PLT 242.0 09/29/2020   Lab Results  Component Value Date   IRON 42 02/16/2022   TIBC 333 02/16/2022   FERRITIN 36 02/16/2022   Attestation  Statements:   Reviewed by clinician on day of visit: allergies, medications, problem list, medical history, surgical history, family history, social history, and previous encounter notes.  Time spent on visit including pre-visit chart review and post-visit care and charting was 42 minutes.   I, Trixie Dredge, am acting as transcriptionist for Dennard Nip, MD.  I have reviewed the above documentation for accuracy and completeness, and I agree with the above. -  Dennard Nip, MD

## 2022-04-06 ENCOUNTER — Encounter: Payer: Self-pay | Admitting: Registered"

## 2022-04-06 NOTE — Progress Notes (Signed)
On 03/31/22 patient completed Core Session 14 of Diabetes Prevention Program course virtually with [facility]. By the end of this session patients are able to complete the following objectives:   Virtual Visit via Video Note  I connected with [name] by a video enabled application and verified that I am speaking with the correct person using two identifiers.  Location: Patient: Home.  Provider: Office.   Learning Objectives: Give examples of problem social cues and helpful social cues.  Explain how to remove problem social cues and add helpful ones.  Describe ways of coping with vacations and social events such as parties, holidays, and visits from relatives and friends.  Create an action plan to change a problem social cue and add a helpful one.   Goals:  Record weight taken outside of class.  Track foods and beverages eaten each day in the "Food and Activity Tracker," including calories and fat grams for each item.   Track activity type, minutes you were active, and distance you reached each day in the "Food and Activity Tracker."  Do your best to reach activity goal for the week. Use action plan created during session to change a problem social cue and add a helpful social cue.  Answer questions regarding success of changing social cues on "To Do Next Week" handout.   Follow-Up Plan: Attend Core Session 15 next week.  Email completed "Food and Activity Tracker" before next week to be reviewed by Lifestyle Coach.

## 2022-04-07 ENCOUNTER — Encounter: Payer: BC Managed Care – PPO | Attending: Internal Medicine | Admitting: Registered"

## 2022-04-07 DIAGNOSIS — H353131 Nonexudative age-related macular degeneration, bilateral, early dry stage: Secondary | ICD-10-CM | POA: Diagnosis not present

## 2022-04-07 DIAGNOSIS — H04123 Dry eye syndrome of bilateral lacrimal glands: Secondary | ICD-10-CM | POA: Diagnosis not present

## 2022-04-07 DIAGNOSIS — H2513 Age-related nuclear cataract, bilateral: Secondary | ICD-10-CM | POA: Diagnosis not present

## 2022-04-07 DIAGNOSIS — H40013 Open angle with borderline findings, low risk, bilateral: Secondary | ICD-10-CM | POA: Diagnosis not present

## 2022-04-07 DIAGNOSIS — Z83511 Family history of glaucoma: Secondary | ICD-10-CM | POA: Diagnosis not present

## 2022-04-07 DIAGNOSIS — R7303 Prediabetes: Secondary | ICD-10-CM | POA: Insufficient documentation

## 2022-04-13 ENCOUNTER — Ambulatory Visit (INDEPENDENT_AMBULATORY_CARE_PROVIDER_SITE_OTHER): Payer: BC Managed Care – PPO | Admitting: Family Medicine

## 2022-04-13 DIAGNOSIS — E669 Obesity, unspecified: Secondary | ICD-10-CM

## 2022-04-13 DIAGNOSIS — R7303 Prediabetes: Secondary | ICD-10-CM

## 2022-04-13 NOTE — Patient Instructions (Addendum)
Make sure you eat enough for lunch, especially following workouts (when you have had only a small breakfast before).   Breakfast ideas:  Egg casserole/muffin quiches; scrambled eggs in wrap;  Appropriate portions:   Carb: If you had type 2 diabetes (which you do not), I would probably suggest you limit carb foods to two portions per meal.  One carb portion = 1 slice of bread or 1/2 c of rice/potatoes/pasta or 15 grams of total carb as shown on the label.  Reminder: You do NOT have diabetes, so this suggests that you should never feel you can't have at least 2 carb portions per meal.   Again: If you use the template of 1/4 the volume of your meal coming from carb, 1/4 from protein, and half the volume from vegetables, it will be hard to go too far wrong.   Use fresh fruit more frequently for after lunch.    Your vulnerable time of day for non-hunger eating is mainly afternoon.  Do the best you can to plan activities for afternoon, so you aren't eating out of boredom.  In addition, pay attention to getting a satisfying lunch.    Goals:  1. Incorporate some resistance training at least 2 X wk.   2. Eat vegetables at least twice a day.   3. Include a protein food with each meal.     Document progress on your above goals.     - You can do an IT consultant for [employer] or Mediterranean diet.     - Follow-up appt on September 18 at 1:30 PM.

## 2022-04-13 NOTE — Progress Notes (Signed)
Medical Nutrition Therapy Appt start time: 5027 end time: 7412 (1 hour) Primary concerns today: Weight management and overall health and nutrition .   Relevant history/background: Kesleigh wants to lose wt & control BG.  Dx's incl pre-DM, HLD, low HDL, osteoporosis, Fe-defic anemia, and low RMR (assessed by Dennard Nip, MD at [facility]).  Also has SIBO for which she sees a functional physician remotely in [place]  every 2-3 mo.  Bowel function has improved in the past several months.  Carsyn and her husband Gene moved to [place] from [place] right before [place] pandemic.  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  Doesn't yet have a strong social network here.  Verenise istaking the Diabetes Prevention Program through [facility] and seeing Dr. Leafy Ro at [facility].  Assessment: Lashayla has focused mainly on two things: making sure she has protein at each meal; and getting a lot of vegetables, i.e., for both meals and snacks.  At 04/03/22 encounter with Dr. Leafy Ro, body comp analysis indicated a 1.5-lb gain in lean mass while weight remained stable.   We discussed Schyler's frequent craving for something sweet after lunch as well as her limited use of carb foods.  I'm concerned that there are times when she gets over-hungry, leading to poorer food choices subsequently.  For example, yesterday she gave in to the urge to buy corn souffl, which she felt was excessive after having eaten it.  She bought it following 90 min of working out, after a breakfast of only 1 egg, 1/4 cup of berries, and a cup of coffee; yet, she also thought she'd eaten too much rice in the 4 inari sushis she had for lunch.    Learning Readiness:  Change in progress.   Weight: No weight check per pt preference. (167 lb on 04/03/22; 174.2 lb on 03/13/22. Ht is 64") Usual eating pattern: 3 meals and 0-1 snacks per day.   Usual physical activity: - 5 X wk various 45-min  classes: Research officer, political party, yoga, yoga Pilates, water aerobics.  Sleep: 6-8 hrs/night. Is using Aura ring to help with sleep, which tracks body temperature and other biometrics.    24-hr recall:  (Up at 6:30 AM) B (9 AM)-  1 boiled egg, 1/4 c berries, 1 c coffee, 1 tbsp milk, 1/2 tsp sugar, water  10 AM: 45-min gentle yoga class + 11 AM 45-min Pilates reformer class; drank some water Snk ( AM)-  --- L (12:30 PM)-  4 inari soy wraps with rice, crab, and ginger Snk (1 PM)-  3 dried dates, 1/2 c corn souffl, 1 c coffee, 1 tbsp milk, 1/2 tsp sugar Snk (2 PM)-  1 bbq chx brst, water Snk (4 PM)-  1/2 bag microwave popcorn (no fat added) D ( PM)-  --- Typical day? No. Ate more than usual, especially corn souffl, and didn't get usual veg's yesterday.     Nutritional Diagnosis: Progress noted on NI-5.8.5 Inadeqate fiber intake As related to insufficient vegetables.  As evidenced by recent usual intake of vegetables at least twice daily.  Handouts given during visit include: After-Visit Summary (AVS)  Demonstrated degree of understanding via:  Teach Back  Barriers to learning/adherence to lifestyle change: Low RMR (as measured at [place]) requires low energy intake for weight loss.    Monitoring/Evaluation:  Dietary intake, exercise, and body weight in 3 month(s).

## 2022-04-14 ENCOUNTER — Encounter: Payer: Self-pay | Admitting: Registered"

## 2022-04-14 ENCOUNTER — Encounter: Payer: BC Managed Care – PPO | Attending: Internal Medicine | Admitting: Registered"

## 2022-04-14 DIAGNOSIS — R7303 Prediabetes: Secondary | ICD-10-CM | POA: Insufficient documentation

## 2022-04-14 NOTE — Progress Notes (Signed)
On 04/07/22 patient completed Core Session 15 of Diabetes Prevention Program course virtually with [facility]. By the end of this session patients are able to complete the following objectives:   Virtual Visit via Video Note  I connected with Kim Kim by a video enabled application and verified that I am speaking with the correct person using two identifiers.  Location: Patient: Home.  Provider: Office.  Learning Objectives: Explain how to prevent stress or cope with unavoidable stress.  Describe how this program can be a source of stress.  Explain how to manage stressful situations.  Create and follow an action plan for either preventing or coping with a stressful situation.   Goals:  Record weight taken outside of class.  Track foods and beverages eaten each day in the "Food and Activity Tracker," including calories and fat grams for each item.   Track activity type, minutes you were active, and distance you reached each day in the "Food and Activity Tracker."  Do your best to reach activity goal for the week. Follow your action plan to reduce stress.  Answer questions on handout regarding success of action plan.   Follow-Up Plan: Attend Core Session 16 next week.  Email completed "Food and Activity Tracker" before next week to be reviewed by Lifestyle Coach.

## 2022-04-20 ENCOUNTER — Encounter: Payer: Self-pay | Admitting: Registered"

## 2022-04-20 NOTE — Progress Notes (Signed)
On 04/14/22 patient completed Core Session 16 of Diabetes Prevention Program course virtually with [facility]. By the end of this session patients are able to complete the following objectives:   Virtual Visit via Video Note  I connected with Kim Kim by a video enabled application and verified that I am speaking with the correct person using two identifiers.  Location: Patient: Home.  Provider: Office.   Learning Objectives: Measure their progress toward weight and physical activity goals since Session 1.  Develop a plan for improving progress, if their goals have not yet been attained.  Describe ways to stay motivated long-term.   Goals:  Record weight taken outside of class.  Track foods and beverages eaten each day in the "Food and Activity Tracker," including calories and fat grams for each item.   Track activity type, minutes you were active, and distance you reached each day in the "Food and Activity Tracker."  Utilize action plan to help stay motivated and complete questions on "To Do List."   Follow-Up Plan: Attend session 17 in three weeks.  Email completed "Food and Activity Tracker" before next session to be reviewed by Lifestyle Coach.

## 2022-04-27 DIAGNOSIS — K6389 Other specified diseases of intestine: Secondary | ICD-10-CM | POA: Diagnosis not present

## 2022-04-27 DIAGNOSIS — E063 Autoimmune thyroiditis: Secondary | ICD-10-CM | POA: Diagnosis not present

## 2022-04-27 DIAGNOSIS — R198 Other specified symptoms and signs involving the digestive system and abdomen: Secondary | ICD-10-CM | POA: Diagnosis not present

## 2022-04-27 DIAGNOSIS — E559 Vitamin D deficiency, unspecified: Secondary | ICD-10-CM | POA: Diagnosis not present

## 2022-04-27 DIAGNOSIS — D352 Benign neoplasm of pituitary gland: Secondary | ICD-10-CM | POA: Diagnosis not present

## 2022-04-27 DIAGNOSIS — M810 Age-related osteoporosis without current pathological fracture: Secondary | ICD-10-CM | POA: Diagnosis not present

## 2022-04-27 DIAGNOSIS — E038 Other specified hypothyroidism: Secondary | ICD-10-CM | POA: Diagnosis not present

## 2022-04-27 DIAGNOSIS — R7303 Prediabetes: Secondary | ICD-10-CM | POA: Diagnosis not present

## 2022-04-27 DIAGNOSIS — E8881 Metabolic syndrome: Secondary | ICD-10-CM | POA: Diagnosis not present

## 2022-05-03 DIAGNOSIS — R922 Inconclusive mammogram: Secondary | ICD-10-CM | POA: Diagnosis not present

## 2022-05-03 DIAGNOSIS — Z1231 Encounter for screening mammogram for malignant neoplasm of breast: Secondary | ICD-10-CM | POA: Diagnosis not present

## 2022-05-04 LAB — HM DEXA SCAN

## 2022-05-05 ENCOUNTER — Encounter: Payer: BC Managed Care – PPO | Attending: Internal Medicine | Admitting: Registered"

## 2022-05-05 DIAGNOSIS — J358 Other chronic diseases of tonsils and adenoids: Secondary | ICD-10-CM | POA: Diagnosis not present

## 2022-05-05 DIAGNOSIS — J342 Deviated nasal septum: Secondary | ICD-10-CM | POA: Diagnosis not present

## 2022-05-05 DIAGNOSIS — R7303 Prediabetes: Secondary | ICD-10-CM | POA: Insufficient documentation

## 2022-05-05 DIAGNOSIS — J392 Other diseases of pharynx: Secondary | ICD-10-CM | POA: Diagnosis not present

## 2022-05-05 DIAGNOSIS — J300 Vasomotor rhinitis: Secondary | ICD-10-CM | POA: Diagnosis not present

## 2022-05-10 DIAGNOSIS — R4189 Other symptoms and signs involving cognitive functions and awareness: Secondary | ICD-10-CM | POA: Diagnosis not present

## 2022-05-10 DIAGNOSIS — R198 Other specified symptoms and signs involving the digestive system and abdomen: Secondary | ICD-10-CM | POA: Diagnosis not present

## 2022-05-10 DIAGNOSIS — Z6829 Body mass index (BMI) 29.0-29.9, adult: Secondary | ICD-10-CM | POA: Diagnosis not present

## 2022-05-11 ENCOUNTER — Encounter: Payer: Self-pay | Admitting: Registered"

## 2022-05-11 NOTE — Progress Notes (Signed)
On 05/05/22 patient completed a post core session of the Diabetes Prevention Program course virtually with [facility]. By the end of this session patients are able to complete the following objectives:   Virtual Visit via Video Note  I connected with Kim Kim by a video enabled application and verified that I am speaking with the correct person using two identifiers.  Location: Patient: Home.  Provider: Office.   Learning Objectives: Identify how to maintain and/or continue working toward program goals for the remainder of the program.  Describe ways that food and activity tracking can assist them in maintaining/reaching program goals.  Identify progress they have made since the beginning of the program.   Goals:  Record weight taken outside of class.  Track foods and beverages eaten each day in the "Food and Activity Tracker," including calories and fat grams for each item.   Track activity type, minutes you were active, and distance you reached each day in the "Food and Activity Tracker."   Follow-Up Plan: Attend session 18 in two weeks.  Email completed "Food and Activity Trackers" before next session to be reviewed by Lifestyle Coach.

## 2022-05-16 DIAGNOSIS — Q359 Cleft palate, unspecified: Secondary | ICD-10-CM | POA: Diagnosis not present

## 2022-05-16 DIAGNOSIS — J3089 Other allergic rhinitis: Secondary | ICD-10-CM | POA: Diagnosis not present

## 2022-05-16 DIAGNOSIS — Z0001 Encounter for general adult medical examination with abnormal findings: Secondary | ICD-10-CM | POA: Diagnosis not present

## 2022-05-16 DIAGNOSIS — G40109 Localization-related (focal) (partial) symptomatic epilepsy and epileptic syndromes with simple partial seizures, not intractable, without status epilepticus: Secondary | ICD-10-CM | POA: Diagnosis not present

## 2022-05-17 DIAGNOSIS — L0232 Furuncle of buttock: Secondary | ICD-10-CM | POA: Diagnosis not present

## 2022-05-17 DIAGNOSIS — L309 Dermatitis, unspecified: Secondary | ICD-10-CM | POA: Diagnosis not present

## 2022-06-01 ENCOUNTER — Ambulatory Visit: Payer: BC Managed Care – PPO | Admitting: Internal Medicine

## 2022-06-02 ENCOUNTER — Encounter: Payer: BC Managed Care – PPO | Attending: Internal Medicine | Admitting: Registered"

## 2022-06-02 ENCOUNTER — Encounter: Payer: Self-pay | Admitting: Registered"

## 2022-06-02 DIAGNOSIS — R7303 Prediabetes: Secondary | ICD-10-CM | POA: Insufficient documentation

## 2022-06-02 NOTE — Progress Notes (Signed)
On 06/02/22 patient completed a post core session of the Diabetes Prevention Program course virtually with [facility]. By the end of this session patients are able to complete the following objectives:   Virtual Visit via Video Note  I connected with Kim Kim by a video enabled application and verified that I am speaking with the correct person using two identifiers.  Location: Patient: Home.  Provider: Office.   Learning Objectives: List ways to make recipes healthier.  List lower-fat and lower-calorie substitutions for common ingredients.  Identify low-fat cooking methods.  Describe how to choose a cookbook that works best for their needs.   Goals:  Record weight taken outside of class.  Track foods and beverages eaten each day in the "Food and Activity Tracker," including calories and fat grams for each item.   Track activity type, minutes you were active, and distance you reached each day in the "Food and Activity Tracker."   Follow-Up Plan: Attend next session.  Email completed "Food and Activity Trackers" before next session to be reviewed by Lifestyle Coach.

## 2022-06-05 ENCOUNTER — Ambulatory Visit (INDEPENDENT_AMBULATORY_CARE_PROVIDER_SITE_OTHER): Payer: BC Managed Care – PPO | Admitting: Family Medicine

## 2022-06-05 VITALS — BP 131/84 | HR 69 | Temp 97.9°F | Ht 64.0 in | Wt 166.0 lb

## 2022-06-05 DIAGNOSIS — E663 Overweight: Secondary | ICD-10-CM

## 2022-06-05 DIAGNOSIS — E7849 Other hyperlipidemia: Secondary | ICD-10-CM | POA: Diagnosis not present

## 2022-06-05 DIAGNOSIS — Z6828 Body mass index (BMI) 28.0-28.9, adult: Secondary | ICD-10-CM

## 2022-06-05 DIAGNOSIS — R7303 Prediabetes: Secondary | ICD-10-CM | POA: Diagnosis not present

## 2022-06-07 ENCOUNTER — Encounter (INDEPENDENT_AMBULATORY_CARE_PROVIDER_SITE_OTHER): Payer: Self-pay

## 2022-06-12 NOTE — Progress Notes (Unsigned)
Chief Complaint:   OBESITY Kim Kim is here to discuss her progress with her obesity treatment plan along with follow-up of her obesity related diagnoses. Kim Kim is on keeping a food journal and adhering to recommended goals of 1100-1200 calories and 75+ grams of protein daily and states she is following her eating plan approximately 75% of the time. Kim Kim states she is doing pilate, lyoga, and walking for 60 minutes 7 times per week.  Today's visit was #: 10 Starting weight: 166 lbs Starting date: 07/12/2021 Today's weight: 166 lbs Today's date: 06/05/2022 Total lbs lost to date: 0 Total lbs lost since last in-office visit: 1  Interim History: Kim Kim has been traveling and out of town for the last month.  She is mindful of her food choices.  She notes eating the same thing over and over again, but then gets very tired of the plan.  She is trying to increase her protein.  Subjective:   1. Prediabetes Kim Kim's recent A1c is still above goal, but she is doing well with working on her diet and exercise.  She has no signs of hypoglycemia.  2. Other hyperlipidemia Kim Kim is on Crestor and she is eating a low saturated fat diet.  Chest pain or myalgias were noted.  Assessment/Plan:   1. Prediabetes Kim Kim will continue with her diet and exercise, and we will continue to follow.  2. Other hyperlipidemia Kim Kim will continue with her diet and exercise.  Family history and liver function as related to LDL was discussed.  3. Overweight with current BMI of 28.6 Kim Kim is currently in the action stage of change. As such, her goal is to continue with weight loss efforts. She has agreed to keeping a food journal and adhering to recommended goals of 1100-1200 calories and 75+ grams of protein daily.   Exercise goals: As is.   Behavioral modification strategies: increasing lean protein intake.  Kim Kim has agreed to follow-up with our clinic in 4 weeks. She was informed of the importance of frequent  follow-up visits to maximize her success with intensive lifestyle modifications for her multiple health conditions.   Objective:   Blood pressure 131/84, pulse 69, temperature 97.9 F (36.6 C), height '5\' 4"'$  (1.626 m), weight 166 lb (75.3 kg). Body mass index is 28.49 kg/m.  General: Cooperative, alert, well developed, in no acute distress. HEENT: Conjunctivae and lids unremarkable. Cardiovascular: Regular rhythm.  Lungs: Normal work of breathing. Neurologic: No focal deficits.   Lab Results  Component Value Date   CREATININE 0.9 07/14/2021   BUN 17 07/14/2021   NA 139 07/14/2021   K 4.2 07/14/2021   CL 104 07/14/2021   CO2 26 (A) 07/14/2021   Lab Results  Component Value Date   ALT 17 07/14/2021   AST 25 07/14/2021   ALKPHOS 112 07/14/2021   BILITOT 0.4 07/12/2021   Lab Results  Component Value Date   HGBA1C 5.7 07/14/2021   HGBA1C 6.1 09/06/2020   HGBA1C 5.8 01/07/2020   Lab Results  Component Value Date   INSULIN 9.5 07/12/2021   Lab Results  Component Value Date   TSH 2.32 11/28/2021   No results found for: "CHOL", "HDL", "LDLCALC", "LDLDIRECT", "TRIG", "CHOLHDL" Lab Results  Component Value Date   VD25OH 36.99 09/06/2020   VD25OH 34.95 01/07/2020   Lab Results  Component Value Date   WBC 7.1 09/29/2020   HGB 12.0 09/29/2020   HCT 36.1 09/29/2020   MCV 80.8 09/29/2020   PLT 242.0 09/29/2020  Lab Results  Component Value Date   IRON 42 02/16/2022   TIBC 333 02/16/2022   FERRITIN 36 02/16/2022   Attestation Statements:   Reviewed by clinician on day of visit: allergies, medications, problem list, medical history, surgical history, family history, social history, and previous encounter notes.  Time spent on visit including pre-visit chart review and post-visit care and charting was 45 minutes.   I, Trixie Dredge, am acting as transcriptionist for Dennard Nip, MD.  I have reviewed the above documentation for accuracy and completeness, and I  agree with the above. -  Dennard Nip, MD

## 2022-06-16 ENCOUNTER — Encounter: Payer: BC Managed Care – PPO | Attending: Internal Medicine | Admitting: Registered"

## 2022-06-16 ENCOUNTER — Encounter: Payer: Self-pay | Admitting: Registered"

## 2022-06-16 DIAGNOSIS — R7303 Prediabetes: Secondary | ICD-10-CM | POA: Insufficient documentation

## 2022-06-16 NOTE — Progress Notes (Signed)
On 06/16/2022 patient completed a post core session of the Diabetes Prevention Program course virtually with [facility]. By the end of this session patients are able to complete the following objectives:   Virtual Visit via Video Note  I connected with Kim Kim by a video enabled application and verified that I am speaking with the correct person using two identifiers.  Location: Patient: home Provider: office  Learning Objectives: Describe the importance of having regular meals each day and how skipping meals can negatively affect food choices and weight.  Plan out balanced meals and snacks. List ways to avoid unplanned snacking.   Goals:  Record weight taken outside of class.  Track foods and beverages eaten each day in the "Food and Activity Tracker," including calories and fat grams for each item.   Track activity type, minutes you were active, and distance you reached each day in the "Food and Activity Tracker."   Follow-Up Plan: Attend next session.  Email completed "Food and Activity Trackers" before next session to be reviewed by Lifestyle Coach.

## 2022-06-22 DIAGNOSIS — M79641 Pain in right hand: Secondary | ICD-10-CM | POA: Diagnosis not present

## 2022-06-22 DIAGNOSIS — M2550 Pain in unspecified joint: Secondary | ICD-10-CM | POA: Diagnosis not present

## 2022-07-04 DIAGNOSIS — M2550 Pain in unspecified joint: Secondary | ICD-10-CM | POA: Diagnosis not present

## 2022-07-04 DIAGNOSIS — M8580 Other specified disorders of bone density and structure, unspecified site: Secondary | ICD-10-CM | POA: Diagnosis not present

## 2022-07-04 DIAGNOSIS — M7989 Other specified soft tissue disorders: Secondary | ICD-10-CM | POA: Diagnosis not present

## 2022-07-04 DIAGNOSIS — M79641 Pain in right hand: Secondary | ICD-10-CM | POA: Diagnosis not present

## 2022-07-06 ENCOUNTER — Encounter: Payer: Self-pay | Admitting: Sports Medicine

## 2022-07-06 ENCOUNTER — Ambulatory Visit (INDEPENDENT_AMBULATORY_CARE_PROVIDER_SITE_OTHER): Payer: BC Managed Care – PPO | Admitting: Sports Medicine

## 2022-07-06 VITALS — BP 98/68 | HR 73 | Ht 64.0 in | Wt 166.0 lb

## 2022-07-06 DIAGNOSIS — M1711 Unilateral primary osteoarthritis, right knee: Secondary | ICD-10-CM | POA: Diagnosis not present

## 2022-07-06 NOTE — Progress Notes (Signed)
  Angelica Frandsen - 66 y.o. female MRN [id number]  Date of birth: 10/13/1956    CHIEF COMPLAINT:       SUBJECTIVE:   HPI:  Patient reports right knee pain after a Pilates class yesterday.  Patient has history of arthritis in right knee, but notes normal function, w/o pain prior to injury.  Patient was doing exercise in Pilates class that caused her to [facility] herself and stress her knee. Patient had pain, and swelling the rest of that night, with difficulty weightbearing.  Patient is able to bear weight this morning and reports improvement in swelling. Patient has tried aleve, with some benefit.    ROS:     See HPI  PERTINENT  PMH / PSH FH / / SH:  Past Medical, Surgical, Social, and Family History Reviewed & Updated in the EMR.  Pertinent findings include: OA of knee   OBJECTIVE: BP 98/68 (BP Location: Left Arm, Patient Position: Sitting, Cuff Size: Normal)   Pulse 73   Ht '5\' 4"'$  (1.626 m)   Wt 166 lb (75.3 kg)   BMI 28.49 kg/m   Physical Exam:  Vital signs are reviewed.  GEN: Alert and oriented, NAD Pulm: Breathing unlabored PSY: normal mood, congruent affect MSK: Minimal effusion of right knee with no erythema. TTP on lateral joint line with negative valgus/varus testing. ROM reduced with patient able to flex knee to 90 degrees. Negative theassaly test, anterior/posterior drawer test, and patient neurovascularly intact  ASSESSMENT & PLAN:  1. OA flare of right kne Patient with hx of OA and recent overuse injury likely causing a flare in the knee arthritis. Mechanism of injury was atraumatic making ligament/tendon/soft tissue damage less likely. Given physical exam and hx, most concerning for OA flare. Will have patient f/u in 1 wk if symptoms not improved, to consider knee x-ray and steroid injection. -Continue aleve, 1 tab daily / Tylenol prn -Ace Wrap for swelling/stability -Isometric hamstring strengthening exercises -f/u 1 wk if symptoms persist.    Holley Bouche,  MD PGY-2, [facility] Resident [place]  Patient seen and evaluated with the resident.  I agree with the above plan of care.  This is likely a flare of her osteoarthritis of her right knee.  She tells me that she knows that she has arthritis but has done well up until yesterday.  Treatment as above and follow-up in 1 to 2 weeks.  If symptoms persist consider cortisone injection and updated x-rays of the right knee.

## 2022-07-10 ENCOUNTER — Encounter (INDEPENDENT_AMBULATORY_CARE_PROVIDER_SITE_OTHER): Payer: Self-pay | Admitting: Family Medicine

## 2022-07-10 ENCOUNTER — Ambulatory Visit (INDEPENDENT_AMBULATORY_CARE_PROVIDER_SITE_OTHER): Payer: BC Managed Care – PPO | Admitting: Family Medicine

## 2022-07-10 VITALS — BP 104/71 | HR 83 | Temp 97.8°F | Ht 64.0 in | Wt 169.0 lb

## 2022-07-10 DIAGNOSIS — R7303 Prediabetes: Secondary | ICD-10-CM | POA: Diagnosis not present

## 2022-07-10 DIAGNOSIS — E669 Obesity, unspecified: Secondary | ICD-10-CM

## 2022-07-10 DIAGNOSIS — Z6829 Body mass index (BMI) 29.0-29.9, adult: Secondary | ICD-10-CM | POA: Diagnosis not present

## 2022-07-10 MED ORDER — METFORMIN HCL 500 MG PO TABS: 250.0000 mg | ORAL_TABLET | Freq: Every day | ORAL | 0 refills | Status: DC

## 2022-07-13 ENCOUNTER — Ambulatory Visit: Payer: BC Managed Care – PPO | Admitting: Sports Medicine

## 2022-07-13 NOTE — Progress Notes (Unsigned)
Medical Nutrition Therapy Appt start time: 1330 end time: 1430 (1 hour) Primary concerns today: Weight management and overall health and nutrition .   Relevant history/background: Kim Kim wants to lose wt & control BG.  Dx's incl pre-DM, HLD, low HDL, osteoporosis, Fe-defic anemia, and low RMR (assessed by Kim Nip, MD at [facility]).  Also has SIBO for which she sees a functional physician remotely in [place]  every 2-3 mo.  Bowel function has improved in the past several months.  Kim Kim and her husband Kim Kim moved to [place] from [place] right before Branch pandemic.  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  Doesn't yet have a strong social network here.  Kim Kim is taking the Diabetes Prevention Program through [facility] and seeing Dr. Leafy Ro at [facility], who has recommended 1100-1200 kcal and >75 grams of protein/day.   Assessment: Kim Kim continues to see Dr. Leafy Ro at [facility], who has recommended incorporating more strength training (see below).   Medical eval in [place] this summer: Bone scan revealed pretty stable bone density, femurs still in osteopenic range.  Recent A1c was the same at 5.7.    Weight: Not measured today per pt request (166 lb on 8/7 and 169 lb on 07/10/22; 174.2 lb on 03/13/22. Ht is 64") Usual eating pattern: 3 meals and 0-1 snack per day.  Usually getting ~25 g protein per meal.   Usual physical activity:   5 X wk various 45-min classes including 45-min weight training class 3 X wk [name], yoga, yoga Pilates, water aerobics).  Also doing a personal training once a week.  Right knee OA is better; no longer limiting activity.   Sleep: 6-8 hrs/night, but occasionally has especially poor sleep.  Has been using meditation app through [facility] when she has difficulty sleeping.    Did not do 24-hr recall b/c yesterday was atypical.   Today: Up at 7 AM B (8 AM)-  1 boiled egg, few bites  watermelon, few sips water 9 AM strength class and 10 AM chair yoga; drank water Snk ( AM)-  --- L (11:30 AM)-  ~1 1/2 c pad [place] w/ eggs and tofu, 1 prune, 1 c coffee, 1/2 tsp sugar, water  Nutritional Diagnosis: Stable progress on NI-5.8.5 Inadeqate fiber intake As related to insufficient vegetables.  As evidenced by recent usual intake of vegetables >1 time/day.  Handouts given during visit include: After-Visit Summary (AVS)  Demonstrated degree of understanding via:  Teach Back  Barriers to learning/adherence to lifestyle change: Low RMR (as measured at [facility]) requires low energy intake for weight loss.    Monitoring/Evaluation:  Dietary intake, exercise, and body weight prn.   [name] will call for f/u appt in the new year.

## 2022-07-14 ENCOUNTER — Encounter: Payer: BC Managed Care – PPO | Attending: Internal Medicine | Admitting: Registered"

## 2022-07-14 DIAGNOSIS — R7303 Prediabetes: Secondary | ICD-10-CM | POA: Insufficient documentation

## 2022-07-14 NOTE — Progress Notes (Unsigned)
Chief Complaint:   OBESITY Kim Kim is here to discuss her progress with her obesity treatment plan along with follow-up of her obesity related diagnoses. Kim Kim is on keeping a food journal and adhering to recommended goals of 1100-1200 calories and 75+ grams of protein daily and states she is following her eating plan approximately 0% of the time. Kim Kim states she is doing water aerobics, pilates, and yoga for 60-120 minutes 5 times per week.  Today's visit was #: 11 Starting weight: 166 lbs Starting date: 07/12/2021 Today's weight: 169 lbs Today's date: 07/10/2022 Total lbs lost to date: 0 Total lbs lost since last in-office visit: 0  Interim History: Kim Kim has been traveling more and eating out more.  She is exercising regularly and has a Physiological scientist now with a combination of cardio and strengthening.  She is trying to get her protein in at every meal.  Subjective:   1. Prediabetes Kim Kim is working on her diet and exercise.  Assessment/Plan:   1. Prediabetes Kim Kim will continue metformin 500 mg 1/2 tablet every morning, and we will refill for 1 month. She will continue to work on weight loss, exercise, and decreasing simple carbohydrates to help decrease the risk of diabetes.   - metFORMIN (GLUCOPHAGE) 500 MG tablet; Take 0.5 tablets (250 mg total) by mouth daily with breakfast.  Dispense: 15 tablet; Refill: 0  2. Obesity, Current BMI 29.0 Kim Kim is currently in the action stage of change. As such, her goal is to continue with weight loss efforts. She has agreed to keeping a food journal and adhering to recommended goals of 1100-1200 calories and 75+ grams of protein daily.   Exercise goals: As is.   Behavioral modification strategies: increasing lean protein intake.  Kim Kim has agreed to follow-up with our clinic in 3 weeks. She was informed of the importance of frequent follow-up visits to maximize her success with intensive lifestyle modifications for her multiple health  conditions.   Objective:   Blood pressure 104/71, pulse 83, temperature 97.8 F (36.6 C), height '5\' 4"'$  (1.626 m), weight 169 lb (76.7 kg), SpO2 96 %. Body mass index is 29.01 kg/m.  General: Cooperative, alert, well developed, in no acute distress. HEENT: Conjunctivae and lids unremarkable. Cardiovascular: Regular rhythm.  Lungs: Normal work of breathing. Neurologic: No focal deficits.   Lab Results  Component Value Date   CREATININE 0.9 07/14/2021   BUN 17 07/14/2021   NA 139 07/14/2021   K 4.2 07/14/2021   CL 104 07/14/2021   CO2 26 (A) 07/14/2021   Lab Results  Component Value Date   ALT 17 07/14/2021   AST 25 07/14/2021   ALKPHOS 112 07/14/2021   BILITOT 0.4 07/12/2021   Lab Results  Component Value Date   HGBA1C 5.7 07/14/2021   HGBA1C 6.1 09/06/2020   HGBA1C 5.8 01/07/2020   Lab Results  Component Value Date   INSULIN 9.5 07/12/2021   Lab Results  Component Value Date   TSH 2.32 11/28/2021   No results found for: "CHOL", "HDL", "LDLCALC", "LDLDIRECT", "TRIG", "CHOLHDL" Lab Results  Component Value Date   VD25OH 36.99 09/06/2020   VD25OH 34.95 01/07/2020   Lab Results  Component Value Date   WBC 7.1 09/29/2020   HGB 12.0 09/29/2020   HCT 36.1 09/29/2020   MCV 80.8 09/29/2020   PLT 242.0 09/29/2020   Lab Results  Component Value Date   IRON 42 02/16/2022   TIBC 333 02/16/2022   FERRITIN 36 02/16/2022  Attestation Statements:   Reviewed by clinician on day of visit: allergies, medications, problem list, medical history, surgical history, family history, social history, and previous encounter notes.   I, Trixie Dredge, am acting as transcriptionist for Dennard Nip, MD.  I have reviewed the above documentation for accuracy and completeness, and I agree with the above. -  Dennard Nip, MD

## 2022-07-16 ENCOUNTER — Encounter: Payer: Self-pay | Admitting: Registered"

## 2022-07-16 NOTE — Progress Notes (Signed)
On 07/14/22 patient completed a post core session of the Diabetes Prevention Program course virtually with [facility]. By the end of this session patients are able to complete the following objectives:   Virtual Visit via Video Note  I connected with Kim Kim by a video enabled application and verified that I am speaking with the correct person using two identifiers.  Location: Patient: Home.  Provider: Office.  Learning Objectives: List risk factors for heart disease.  Define the difference between HDL and LDL cholesterol List ways to reduce risk for heart disease.   Goals:  Record weight taken outside of class.  Track foods and beverages eaten each day in the "Food and Activity Tracker," including calories and fat grams for each item.   Track activity type, minutes you were active, and distance you reached each day in the "Food and Activity Tracker."   Follow-Up Plan: Attend next session.  Email completed "Food and Activity Trackers" before next session to be reviewed by Lifestyle Coach.

## 2022-07-17 ENCOUNTER — Ambulatory Visit (INDEPENDENT_AMBULATORY_CARE_PROVIDER_SITE_OTHER): Payer: BC Managed Care – PPO | Admitting: Family Medicine

## 2022-07-17 DIAGNOSIS — R7303 Prediabetes: Secondary | ICD-10-CM | POA: Diagnosis not present

## 2022-07-17 DIAGNOSIS — E669 Obesity, unspecified: Secondary | ICD-10-CM | POA: Diagnosis not present

## 2022-07-17 NOTE — Patient Instructions (Signed)
Congrat's on establishing a strength training routine.  Keep it up!  Goals:  1. Do at least 10 minutes of some form of meditation 5 days a week.   2. Eat vegetables at least twice a day.  3. Include a protein food with each meal.     Document progress on your above goals using a devoted planner.      - Follow-up appt as needed:  Call [phone] for appt.     Book recommendation: [employer].

## 2022-07-20 DIAGNOSIS — Z01419 Encounter for gynecological examination (general) (routine) without abnormal findings: Secondary | ICD-10-CM | POA: Diagnosis not present

## 2022-07-20 DIAGNOSIS — Z1389 Encounter for screening for other disorder: Secondary | ICD-10-CM | POA: Diagnosis not present

## 2022-07-20 DIAGNOSIS — N8111 Cystocele, midline: Secondary | ICD-10-CM | POA: Diagnosis not present

## 2022-07-25 DIAGNOSIS — R198 Other specified symptoms and signs involving the digestive system and abdomen: Secondary | ICD-10-CM | POA: Diagnosis not present

## 2022-08-01 ENCOUNTER — Encounter (HOSPITAL_BASED_OUTPATIENT_CLINIC_OR_DEPARTMENT_OTHER): Payer: Self-pay | Admitting: Family Medicine

## 2022-08-01 ENCOUNTER — Ambulatory Visit (INDEPENDENT_AMBULATORY_CARE_PROVIDER_SITE_OTHER): Payer: BC Managed Care – PPO | Admitting: Family Medicine

## 2022-08-01 ENCOUNTER — Other Ambulatory Visit (HOSPITAL_BASED_OUTPATIENT_CLINIC_OR_DEPARTMENT_OTHER): Payer: Self-pay

## 2022-08-01 DIAGNOSIS — E038 Other specified hypothyroidism: Secondary | ICD-10-CM

## 2022-08-01 DIAGNOSIS — K219 Gastro-esophageal reflux disease without esophagitis: Secondary | ICD-10-CM | POA: Diagnosis not present

## 2022-08-01 DIAGNOSIS — E782 Mixed hyperlipidemia: Secondary | ICD-10-CM

## 2022-08-01 DIAGNOSIS — R7303 Prediabetes: Secondary | ICD-10-CM

## 2022-08-01 DIAGNOSIS — E063 Autoimmune thyroiditis: Secondary | ICD-10-CM

## 2022-08-01 MED ORDER — DESONIDE 0.05 % EX CREA: 1.0000 | TOPICAL_CREAM | CUTANEOUS | 0 refills | Status: DC | PRN

## 2022-08-01 MED ORDER — INFLUENZA VAC SPLIT QUAD 0.5 ML IM SUSY
PREFILLED_SYRINGE | INTRAMUSCULAR | 0 refills | Status: DC
  Filled 2022-08-01: qty 0.5, 1d supply, fill #0

## 2022-08-01 NOTE — Progress Notes (Signed)
New Patient Office Visit  Subjective    Patient ID: Kim Kim, female    DOB: [birth year]  Age: 66 y.o. MRN: [id number]  CC:  Chief Complaint  Patient presents with   New Patient (Initial Visit)    Pt here to establish new care     HPI Kim Kim presents to establish care Last PCP - Dr. Ronnald Ramp  Hypothyroidism: Currently taking levothyroxine at 50 mcg dose, denies any current issues related to temperature intolerance, changes in bowel habits, notable weight changes  HLD: She is currently taking rosuvastatin, denies any issues with medication, no reported myalgias.  Prediabetes, obesity: She does follow with healthy weight and wellness.  In the past, has been prescribed metformin, has not been taking however, still unsure about starting medication.  Has also worked with nutritionist in the past.  GERD: Patient reports that she continues to have some ongoing issues with reflux.  She has been utilizing omeprazole and has tried maximal dose without notable improvement in symptoms.  She has had prior evaluation with GI, however would be interested in establishing with alternative provider for management  Patient is originally from [place], outside of [place]. Has been here for about 3-4 years. She enjoys working out in [employer], staying active.  Outpatient Encounter Medications as of 08/01/2022  Medication Sig   influenza vac split quadrivalent PF (FLUARIX) 0.5 ML injection Inject into the muscle.   LAMICTAL 100 MG tablet Take 1 tablet (100 mg total) by mouth 2 (two) times daily.   levothyroxine (SYNTHROID) 50 MCG tablet Take 50 mcg by mouth daily before breakfast. 75 Mcg every Sunday   metFORMIN (GLUCOPHAGE) 500 MG tablet Take 0.5 tablets (250 mg total) by mouth daily with breakfast.   omeprazole (PRILOSEC) 40 MG capsule Take 1 capsule (40 mg total) by mouth in the morning and at bedtime. Dispense as brand name Prilosec per patient request.   rosuvastatin (CRESTOR) 10 MG  tablet Take 10 mg by mouth at bedtime.   [DISCONTINUED] arginine 500 MG tablet Take 1 tablet (500 mg total) by mouth daily.   [DISCONTINUED] COVID-19 mRNA bivalent vaccine, Pfizer, (PFIZER COVID-19 VAC BIVALENT) injection Inject into the muscle.   [DISCONTINUED] desonide (DESOWEN) 0.05 % cream Apply 1 application topically as needed.   [DISCONTINUED] estradiol (ESTRACE) 0.1 MG/GM vaginal cream Place vaginally.   [DISCONTINUED] fluticasone (FLONASE) 50 MCG/ACT nasal spray Place into the nose daily as needed.   [DISCONTINUED] influenza vac split quadrivalent PF (FLUARIX) 0.5 ML injection Inject into the muscle.   desonide (DESOWEN) 0.05 % cream Apply 1 Application topically as needed.   No facility-administered encounter medications on file as of 08/01/2022.    Past Medical History:  Diagnosis Date   Allergy    seasonal allergies   Anemia    hx of   Carotid atherosclerosis    Epilepsy (HCC)    Gastric ulcer    hx of   GERD (gastroesophageal reflux disease)    on meds   Growth hormone deficiency ([place])    Hiatal hernia    Hypothyroidism    hx of Hashimoto thyroiditis   Pituitary adenoma ([place])    hx of   Seizures ([place])    temporal lobe   Thyroid disease    on meds   Vitamin D deficiency     Past Surgical History:  Procedure Laterality Date   COLONOSCOPY  12/10/2015   [place] Dr Meredith [place]   ESOPHAGOGASTRODUODENOSCOPY  06/03/2019   Dr Celso Sickle, [place]  WISDOM TOOTH EXTRACTION      Family History  Problem Relation Age of Onset   Hypertension Mother    Heart disease Mother    Diabetes Mother    Thyroid disease Mother        hypo   Thyroid nodules Mother    Hyperlipidemia Father    Heart disease Father    Hypertension Father    Hypertension Sister    Hearing loss Sister    Hypertension Sister    Hearing loss Sister    Diabetes Sister    Hypertension Sister    Hearing loss Sister    Heart disease Brother    Esophageal cancer Neg Hx    Colon cancer Neg Hx     Colon polyps Neg Hx    Stomach cancer Neg Hx    Rectal cancer Neg Hx     Social History   Socioeconomic History   Marital status: Married    Spouse name: Gene   Number of children: 3   Years of education: Not on file   Highest education level: Associate degree: occupational, Hotel manager, or vocational program  Occupational History    Comment: NA  Tobacco Use   Smoking status: Never   Smokeless tobacco: Never  Vaping Use   Vaping Use: Never used  Substance and Sexual Activity   Alcohol use: Not Currently    Comment: socially   Drug use: Never   Sexual activity: Yes    Partners: Male    Birth control/protection: Post-menopausal  Other Topics Concern   Not on file  Social History Narrative   She recently moved from [place]. 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   Caffeine- coffee maybe 1 c, some tea   Social Determinants of Radio broadcast assistant Strain: Not on file  Food Insecurity: Not on file  Transportation Needs: Not on file  Physical Activity: Not on file  Stress: Not on file  Social Connections: Not on file  Intimate Partner Violence: Not on file    Objective    BP 135/80   Pulse 74   Ht '5\' 4"'$  (1.626 m)   Wt 164 lb (74.4 kg)   SpO2 100%   BMI 28.15 kg/m   Physical Exam  66 year old female in no acute distress Cardiovascular exam with regular rate and rhythm Lungs clear to auscultation bilaterally  Assessment & Plan:   Problem List Items Addressed This Visit       Digestive   GERD (gastroesophageal reflux disease) - Primary    Given symptoms despite high-dose of PPI, will proceed with referral to GI, would prefer evaluation with alternative provider, referral placed today      Relevant Orders   Ambulatory referral to Gastroenterology     Endocrine   Hypothyroidism    Most recent TSH on chart review was a few months ago and this was at goal.  Would recommend continuing with same dose of levothyroxine, no  changes at this time        Other   Hyperlipidemia    Tolerating rosuvastatin, recommend continuing with medication as prescribed      Prediabetes    Most recent hemoglobin A1c on chart review was a few months ago and found to be 5.7%.  Recommend continuing with healthy weight and wellness clinic regarding lifestyle modifications, follow-up on weight loss efforts Discussed consideration of metformin initiation which can be helpful in reducing risk of progression to developing diabetes, patient  to consider       Return in about 3 months (around 11/01/2022).   Jinnie Onley J De [place], MD

## 2022-08-01 NOTE — Patient Instructions (Signed)
  Medication Instructions:  Your physician recommends that you continue on your current medications as directed. Please refer to the Current Medication list given to you today. --If you need a refill on any your medications before your next appointment, please call your pharmacy first. If no refills are authorized on file call the office.-- Lab Work: Your physician has recommended that you have lab work today: No If you have labs (blood work) drawn today and your tests are completely normal, you will receive your results via [place] a phone call from our staff.  Please ensure you check your voicemail in the event that you authorized detailed messages to be left on a delegated number. If you have any lab test that is abnormal or we need to change your treatment, we will call you to review the results.  Referrals/Procedures/Imaging: No  Follow-Up: Your next appointment:   Your physician recommends that you schedule a follow-up appointment in: 3 month follow-up with Dr. de [place].  You will receive a text message or e-mail with a link to a survey about your care and experience with [place] today! We would greatly appreciate your feedback!   Thanks for letting us be apart of your health journey!!  Primary Care and Sports Medicine   Dr. Arlina Robes [place]   We encourage you to activate your patient portal called "MyChart".  Sign up information is provided on this After Visit Summary.  MyChart is used to connect with patients for Virtual Visits (Telemedicine).  Patients are able to view lab/test results, encounter notes, upcoming appointments, etc.  Non-urgent messages can be sent to your provider as well. To learn more about what you can do with MyChart, please visit --  [email or web address].

## 2022-08-03 ENCOUNTER — Institutional Professional Consult (permissible substitution): Payer: BC Managed Care – PPO | Admitting: Neurology

## 2022-08-03 ENCOUNTER — Encounter: Payer: Self-pay | Admitting: Neurology

## 2022-08-07 ENCOUNTER — Ambulatory Visit (INDEPENDENT_AMBULATORY_CARE_PROVIDER_SITE_OTHER): Payer: BC Managed Care – PPO | Admitting: Family Medicine

## 2022-08-07 ENCOUNTER — Other Ambulatory Visit (INDEPENDENT_AMBULATORY_CARE_PROVIDER_SITE_OTHER): Payer: Self-pay | Admitting: Family Medicine

## 2022-08-07 ENCOUNTER — Encounter: Payer: Self-pay | Admitting: Neurology

## 2022-08-07 ENCOUNTER — Ambulatory Visit (INDEPENDENT_AMBULATORY_CARE_PROVIDER_SITE_OTHER): Payer: BC Managed Care – PPO | Admitting: Neurology

## 2022-08-07 VITALS — BP 132/68 | HR 64 | Ht 64.0 in | Wt 175.5 lb

## 2022-08-07 DIAGNOSIS — G40109 Localization-related (focal) (partial) symptomatic epilepsy and epileptic syndromes with simple partial seizures, not intractable, without status epilepticus: Secondary | ICD-10-CM

## 2022-08-07 DIAGNOSIS — Z87730 Personal history of (corrected) cleft lip and palate: Secondary | ICD-10-CM | POA: Diagnosis not present

## 2022-08-07 DIAGNOSIS — Z6830 Body mass index (BMI) 30.0-30.9, adult: Secondary | ICD-10-CM

## 2022-08-07 DIAGNOSIS — E6609 Other obesity due to excess calories: Secondary | ICD-10-CM

## 2022-08-07 DIAGNOSIS — R7303 Prediabetes: Secondary | ICD-10-CM

## 2022-08-07 DIAGNOSIS — E063 Autoimmune thyroiditis: Secondary | ICD-10-CM | POA: Diagnosis not present

## 2022-08-07 NOTE — Patient Instructions (Signed)
Healthy Living: Sleep In this video, you will learn why sleep is an important part of a healthy lifestyle. To view the content, go to this web address: [email or web address]  This video will expire on: 04/21/2024. If you need access to this video following this date, please reach out to the healthcare provider who assigned it to you. This information is not intended to replace advice given to you by your health care provider. Make sure you discuss any questions you have with your health care provider. Elsevier Patient Education  [place].

## 2022-08-07 NOTE — Progress Notes (Addendum)
SLEEP MEDICINE CLINIC    Provider:  Larey Seat, MD  Primary Care Physician:  Janith Lima, MD [place] [address]     Referring Provider: Deland Pretty, [place] [address],  [place] [address]          Chief Complaint according to patient   Patient presents with:     New Patient (Initial Visit)     Patient has seen the patient for temporal epilepsy, last 02-16-2022.       HISTORY OF PRESENT ILLNESS:  Kim Kim is a 66 -year-old caucasian female patient seen here upon referral on 08/07/2022 from  Dr. Ronnald Ramp.  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 She reports tonsil stones, and rhinitis. She is not known to snore. Nasal speech. She has insomnia, but not every night. Not a problem to go to sleep , but stay asleep, 4 times a months.     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     Sleep relevant medical history: Nocturia one time , no Tonsillectomy, deviated septum but no repair, cleft palate.     Family medical /sleep history: 2 siblings on CPAP with OSA.    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  She worked daytime. She  lives in a household with spouse, children in [place] and [place],  Pets are not present. Tobacco use- never .  ETOH use ; infrequently - not in months, Caffeine intake in form of Coffee( 1  day) Soda( /) Tea ( /) or energy drinks. Regular exercise in form of daily strength training.         Sleep habits are as follows: The patient's dinner time is between 5 PM. The patient goes to bed  at 8 PM to watch TV and sleeps at  9 PM and continues to sleep for 6-7 hours, wakes for one or none  bathroom breaks, the first time at 2-3 AM.  No sleep related seizures known. She has a deja-vue, same dreams over and over again.   The preferred sleep position is laterally , with the support of 1-2 pillows.  Dreams are reportedly frequent/vivid. She remembers her dreams.  7-7   AM is the usual rise time. The patient wakes up spontaneously at 6-7 AM  She reports not feeling refreshed or restored in AM, with symptoms such as dry mouth, phlegm .  Naps are taken infrequently, lasting from 15 to 30 minutes .   Review of Systems: Out of a complete 14 system review, the patient complains of only the following symptoms, and all other reviewed systems are negative.:  Fatigue, sleepiness , snoring, fragmented sleep, Insomnia - early waking up.    How likely are you to doze in the following situations: 0 = not likely, 1 = slight chance, 2 = moderate chance, 3 = high chance   Sitting and Reading? Watching Television? Sitting inactive in a public place (theater or meeting)? As a passenger in a car for an hour without a break? 0 Lying down in the afternoon when circumstances permit?2 Sitting and talking  to someone? Sitting quietly after lunch without alcohol? In a car, while stopped for a few minutes in traffic?   Total = 2/ 24 points   FSS endorsed at 11/ 63 points.    She denies depression.     Social History   Socioeconomic History   Marital status: Married    Spouse name: Kim Kim   Number of children: 3   Years of education: Not on file   Highest education level: Associate degree: occupational, Hotel manager, or vocational program  Occupational History    Comment: NA  Tobacco Use   Smoking status: Never   Smokeless tobacco: Never  Vaping Use   Vaping Use: Never used  Substance and Sexual Activity   Alcohol use: Not Currently    Comment: socially   Drug use: Never   Sexual activity:  Yes    Partners: Male    Birth control/protection: Post-menopausal  Other Topics Concern   Not on file  Social History Narrative   She recently moved from [place]. 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   Caffeine- coffee maybe 1 c, some tea   Social Determinants of Health   Financial Resource Strain: Not on file  Food Insecurity: Not on file  Transportation Needs: Not on file  Physical Activity: Not on file  Stress: Not on file  Social Connections: Not on file    Family History  Problem Relation Age of Onset   Hypertension Mother    Heart disease Mother    Diabetes Mother    Thyroid disease Mother        hypo   Thyroid nodules Mother    Hyperlipidemia Father    Heart disease Father    Hypertension Father    Hypertension Sister    Hearing loss Sister    Hypertension Sister    Hearing loss Sister    Diabetes Sister    Hypertension Sister    Hearing loss Sister    Heart disease Brother    Esophageal cancer Neg Hx    Colon cancer Neg Hx    Colon polyps Neg Hx    Stomach cancer Neg Hx    Rectal cancer Neg Hx     Past Medical History:  Diagnosis Date   Allergy    seasonal allergies   Anemia    hx of   Carotid atherosclerosis    Epilepsy ([place])    Gastric ulcer    hx of   GERD (gastroesophageal reflux disease)    on meds   Growth hormone deficiency ([place])    Hiatal hernia    Hypothyroidism    hx of Hashimoto thyroiditis   Pituitary adenoma ([place])    hx of   Seizures ([place])    temporal lobe   Thyroid disease    on meds   Vitamin D deficiency     Past Surgical History:  Procedure Laterality Date   COLONOSCOPY  12/10/2015   [place] Dr Meredith Leeds   ESOPHAGOGASTRODUODENOSCOPY  06/03/2019   Dr Celso Sickle, [place] EXTRACTION       Current Outpatient Medications on File Prior to Visit  Medication Sig Dispense Refill   desonide (DESOWEN) 0.05 % cream Apply 1 Application topically as needed. 60 g 0   influenza  vac split quadrivalent PF (FLUARIX) 0.5 ML injection Inject into the muscle. 0.5 mL 0   LAMICTAL 100 MG tablet Take 1 tablet (100 mg total) by mouth 2 (two) times  daily. 180 tablet 4   levothyroxine (SYNTHROID) 50 MCG tablet Take 50 mcg by mouth daily before breakfast. 75 Mcg every Sunday     metFORMIN (GLUCOPHAGE) 500 MG tablet Take 0.5 tablets (250 mg total) by mouth daily with breakfast. 15 tablet 0   omeprazole (PRILOSEC) 40 MG capsule Take 1 capsule (40 mg total) by mouth in the morning and at bedtime. Dispense as brand name Prilosec per patient request. 60 capsule 3   rosuvastatin (CRESTOR) 10 MG tablet Take 10 mg by mouth at bedtime.     No current facility-administered medications on file prior to visit.    No Known Allergies  Physical exam:  Today's Vitals   08/07/22 1250  BP: 132/68  Pulse: 64  Weight: 175 lb 8 oz (79.6 kg)  Height: 5' 4"  (1.626 m)   Body mass index is 30.12 kg/m.   Wt Readings from Last 3 Encounters:  08/07/22 175 lb 8 oz (79.6 kg)  08/01/22 164 lb (74.4 kg)  07/10/22 169 lb (76.7 kg)     Ht Readings from Last 3 Encounters:  08/07/22 5' 4"  (1.626 m)  08/01/22 5' 4"  (1.626 m)  07/10/22 5' 4"  (1.626 m)      General: The patient is awake, alert and appears not in acute distress. The patient is well groomed. Head: Normocephalic, atraumatic. Neck is supple.  Mallampati 3,  neck circumference:13.5  inches . Nasal airflow not fully patent.  Retrognathia is not seen. Small oral opening.  Dental status: regular but small.  Cardiovascular:  Regular rate and cardiac rhythm by pulse,  without distended neck veins. Respiratory: Lungs are clear to auscultation.  Skin:  Without evidence of ankle edema, or rash. Trunk: The patient's posture is erect.   Neurologic exam : The patient is awake and alert, oriented to place and time.   Memory subjective described as intact.  Attention span & concentration ability appears normal.  Speech is fluent,  with 'nasal  speech"  dysphonia.  Mood and affect are appropriate.   Cranial nerves: no loss of smell or taste reported  Pupils are equal and briskly reactive to light. Funduscopic exam deferred. Extraocular movements in vertical and horizontal planes were intact and without nystagmus. No Diplopia. Visual fields by finger perimetry are intact. Hearing was intact to soft voice and finger rubbing.   Facial sensation intact to fine touch.  Facial motor strength is symmetric and tongue and uvula move midline.  Neck ROM : rotation, tilt and flexion extension were normal for age and shoulder shrug was symmetrical.    Motor exam:  Symmetric bulk, tone and ROM.   Normal tone without cog wheeling, symmetric grip strength .   Sensory:  Fine touch and vibration were normal. Left foot tingling, like ants crawling.  Proprioception tested in the upper extremities was normal.   Coordination: Rapid alternating movements in the fingers/hands were of normal speed.  The Finger-to-nose maneuver was intact without evidence of ataxia, dysmetria or tremor.   Gait and station: Patient could rise unassisted from a seated position, walked without assistive device.  Stance is of normal width/ base and the patient turned with 3 steps.  Toe and heel walk were deferred.  Deep tendon reflexes: in the  upper and lower extremities are symmetric and intact.  Babinski response was deferred.      After spending a total time of  35  minutes face to face and additional time for physical and neurologic examination, review of laboratory studies,  personal  review of imaging studies, reports and results of other testing and review of referral information / records as far as provided in visit, I have established the following assessments:  1)  insomnia is a rare occasion for her. She has 4 days a months and its when she wakes up to go to the bathroom, and s her mind will race and she is worried. That's when she can't easily go back to sleep.   2) husband has not told her if she snored.  3) mouth breather. Night guard for bruxism.  4) nasal septum deviation , the right nares is smaller.  5) Lamictal has restored her sleep, started 5 years ago.   My Plan is to proceed with:  1) Screening for sleep apnea in a patient with cleft palate , had surgery at age 52-6 and soft palate was altered.  2) no bone transplant, her cleft palate was only covered by soft tissue.  She cannot snore !!!  3) nasal breathing is laborious, mouth breathe may still not snore due to her soft palate being not present. I prefer an in- lab sleep study.   4) GERD with apnea at night is in the differential.     I would like to thank Ronnald Ramp Arvid Right, MD and Deland Pretty, [place] [address] [place],  [place] [id number] for allowing me to meet with and to take care of this pleasant patient.   In short, Clydene Burack is presenting with nasal speech, and cleft palate, and some nights of insomnia.  I plan to follow up either personally or through our NP  if the PSG is positive for apnea, within  2-4 months.    Cc Dr Leta Baptist, MD   Electronically signed by: Larey Seat, MD 08/07/2022 1:12 PM  [facility] [employer] certified by The [employer] of Sleep Medicine and Diplomate of the [employer] of Sleep Medicine. Board certified In Neurology through the [place], Fellow of the [employer] of Neurology. Medical Director of [employer].

## 2022-08-08 ENCOUNTER — Ambulatory Visit (INDEPENDENT_AMBULATORY_CARE_PROVIDER_SITE_OTHER): Payer: BC Managed Care – PPO | Admitting: Nurse Practitioner

## 2022-08-08 ENCOUNTER — Encounter (HOSPITAL_BASED_OUTPATIENT_CLINIC_OR_DEPARTMENT_OTHER): Payer: Self-pay | Admitting: Nurse Practitioner

## 2022-08-08 VITALS — BP 127/79 | HR 74 | Ht 64.0 in | Wt 174.9 lb

## 2022-08-08 DIAGNOSIS — R21 Rash and other nonspecific skin eruption: Secondary | ICD-10-CM | POA: Diagnosis not present

## 2022-08-08 MED ORDER — KETOCONAZOLE 2 % EX CREA: 1.0000 | TOPICAL_CREAM | Freq: Two times a day (BID) | CUTANEOUS | 3 refills | Status: DC

## 2022-08-08 NOTE — Patient Instructions (Addendum)
I recommend using the ketoconazole cream onto the rash twice a day when you notice this.  You can still use the Desonide for itching.

## 2022-08-08 NOTE — Progress Notes (Signed)
  alarm Orma Render, DNP, AGNP-c Primary Care [facility] Medicine [address]  [place], [place] [address] [phone]  Subjective:   Kim Kim is a 66 y.o. female presents to day for evaluation of: Acute Visit (Patient presents today for rash on bottom, very itching)  Rash Shenee endorses a rash of the gluteal folds and cleft that has been present for the past Has been present for the past several days. She has been using desonide and briefly tried antifungal treatment. She endorses pruritis, and tenderness present. No drainage, pustules, blisters. No hx of HSV.   She endorses seeing a functional medicine specialist in [place]. She has been using basil for treatment of silent GERD. She tells me that she also uses full script functional medications prescribed by her provider. She has been using C[place] for small intestinal bacterial balance. Her labs are completed through [facility] in [place], which provides specific testing for functional medicine.  PMH, Medications, and Allergies reviewed and updated in chart as appropriate.   ROS negative except for what is listed in HPI. Objective:  BP 127/79   Pulse 74   Ht '5\' 4"'$  (1.626 m)   Wt 174 lb 14.4 oz (79.3 kg)   SpO2 98%   BMI 30.02 kg/m  Physical Exam Vitals and nursing note reviewed.  Constitutional:      Appearance: Normal appearance.  Cardiovascular:     Rate and Rhythm: Normal rate and regular rhythm.     Pulses: Normal pulses.     Heart sounds: Normal heart sounds.  Pulmonary:     Effort: Pulmonary effort is normal.  Skin:    Capillary Refill: Capillary refill takes less than 2 seconds.     Findings: Rash present.     Comments: Erythematous macular rash noted in the gluteal folds consistent with intertrigo.   Neurological:     General: No focal deficit present.     Mental Status: She is alert and oriented to person, place, and time.  Psychiatric:        Mood and Affect: Mood normal.         Behavior: Behavior normal.        Thought Content: Thought content normal.        Judgment: Judgment normal.           Assessment & Plan:   Problem List Items Addressed This Visit     Rash of genital area - Primary    Symptoms and presentation consistent with intertrigo most likely from recent increased moisture to genital area. Recommend ketoconazole cream topically twice daily for at least 7 days. May continue if the rash is still present beyond that time. If no improvement, recommend repeat evaluation. OK to use thin layer of desonide topical cream to area to help with pruritis and reduce inflammation. F/U if no improvement in 7 days or any signs of worsening or infection.       Relevant Medications   ketoconazole (NIZORAL) 2 % cream      Orma Render, DNP, AGNP-c 08/22/2022  10:29 PM    History, Medications, Surgery, SDOH, and Family History reviewed and updated as appropriate.

## 2022-08-08 NOTE — Assessment & Plan Note (Addendum)
Tolerating rosuvastatin, recommend continuing with medication as prescribed

## 2022-08-08 NOTE — Assessment & Plan Note (Signed)
Given symptoms despite high-dose of PPI, will proceed with referral to GI, would prefer evaluation with alternative provider, referral placed today

## 2022-08-08 NOTE — Assessment & Plan Note (Signed)
Most recent hemoglobin A1c on chart review was a few months ago and found to be 5.7%.  Recommend continuing with healthy weight and wellness clinic regarding lifestyle modifications, follow-up on weight loss efforts Discussed consideration of metformin initiation which can be helpful in reducing risk of progression to developing diabetes, patient to consider

## 2022-08-08 NOTE — Assessment & Plan Note (Signed)
Most recent TSH on chart review was a few months ago and this was at goal.  Would recommend continuing with same dose of levothyroxine, no changes at this time

## 2022-08-09 ENCOUNTER — Ambulatory Visit (INDEPENDENT_AMBULATORY_CARE_PROVIDER_SITE_OTHER): Payer: BC Managed Care – PPO | Admitting: Family Medicine

## 2022-08-09 ENCOUNTER — Encounter (INDEPENDENT_AMBULATORY_CARE_PROVIDER_SITE_OTHER): Payer: Self-pay | Admitting: Family Medicine

## 2022-08-09 VITALS — BP 95/59 | HR 74 | Temp 97.4°F | Ht 62.5 in | Wt 169.2 lb

## 2022-08-09 DIAGNOSIS — R7303 Prediabetes: Secondary | ICD-10-CM | POA: Diagnosis not present

## 2022-08-09 DIAGNOSIS — E669 Obesity, unspecified: Secondary | ICD-10-CM

## 2022-08-09 DIAGNOSIS — Z6830 Body mass index (BMI) 30.0-30.9, adult: Secondary | ICD-10-CM

## 2022-08-09 DIAGNOSIS — E66811 Obesity, class 1: Secondary | ICD-10-CM

## 2022-08-09 DIAGNOSIS — M8580 Other specified disorders of bone density and structure, unspecified site: Secondary | ICD-10-CM | POA: Diagnosis not present

## 2022-08-10 MED ORDER — METFORMIN HCL 500 MG PO TABS: 500.0000 mg | ORAL_TABLET | Freq: Every day | ORAL | 0 refills | Status: DC

## 2022-08-14 ENCOUNTER — Other Ambulatory Visit (HOSPITAL_BASED_OUTPATIENT_CLINIC_OR_DEPARTMENT_OTHER): Payer: Self-pay

## 2022-08-14 MED ORDER — COMIRNATY 30 MCG/0.3ML IM SUSP
INTRAMUSCULAR | 0 refills | Status: DC
  Filled 2022-08-14: qty 0.3, 1d supply, fill #0

## 2022-08-14 NOTE — Progress Notes (Unsigned)
Chief Complaint:   OBESITY Kim Kim is here to discuss her progress with her obesity treatment plan along with follow-up of her obesity related diagnoses. Kim Kim is on keeping a food journal and adhering to recommended goals of 1100-1200 calories and 75+ grams of protein daily and states she is following her eating plan approximately 0% of the time. Kim Kim states she is doing strengthening and water aerobics for 60 minutes 5 times per week.  Today's visit was #: 12 Starting weight: 166 lbs Starting date: 07/12/2021 Today's weight: 169 lbs Today's date: 08/09/2022 Total lbs lost to date: 0 Total lbs lost since last in-office visit: 0  Interim History: Kim Kim continues to work on her weight loss. She is exercising regularly and she has increased muscle mas since she started. She continues to struggle with journaling but she is still being mindful.   Subjective:   1. Prediabetes Kim Kim is not taking metformin, but she is still struggling with weight loss.   2. Osteopenia, unspecified location Kim Kim's boone density scan shows moderate risk of fracture at femoral neck. Her Vitamin D level was within normal limits.   Assessment/Plan:   1. Prediabetes We will metformin 500 mg once daily for 1 month (she is to take 1/2 tablet PO with food). Kim Kim will continue to work on weight loss, exercise, and decreasing simple carbohydrates to help decrease the risk of diabetes.   - metFORMIN (GLUCOPHAGE) 500 MG tablet; Take 1 tablet (500 mg total) by mouth daily with breakfast.  Dispense: 30 tablet; Refill: 0  2. Osteopenia, unspecified location Kim Kim will continue Vitamin D and strengthening exercises to decrease her risk of fracture.  3. Obesity (BMI 30.9) Kim Kim is currently in the action stage of change. As such, her goal is to continue with weight loss efforts. She has agreed to keeping a food journal and adhering to recommended goals of 1100-1200 calories and 75+ grams of protein daily.   Kim Kim  is to watch out for hidden "healthy" choices. Such as nuts and dried fruits.   Exercise goals: As is.   Behavioral modification strategies: increasing lean protein intake and keeping a strict food journal.  Kim Kim has agreed to follow-up with our clinic in 4 weeks. She was informed of the importance of frequent follow-up visits to maximize her success with intensive lifestyle modifications for her multiple health conditions.   Objective:   Blood pressure (!) 95/59, pulse 74, temperature (!) 97.4 F (36.3 C), height 5' 2.5" (1.588 m), weight 169 lb 3.2 oz (76.7 kg), SpO2 96 %. Body mass index is 30.45 kg/m.  General: Cooperative, alert, well developed, in no acute distress. HEENT: Conjunctivae and lids unremarkable. Cardiovascular: Regular rhythm.  Lungs: Normal work of breathing. Neurologic: No focal deficits.   Lab Results  Component Value Date   CREATININE 0.9 07/14/2021   BUN 17 07/14/2021   NA 139 07/14/2021   K 4.2 07/14/2021   CL 104 07/14/2021   CO2 26 (A) 07/14/2021   Lab Results  Component Value Date   ALT 17 07/14/2021   AST 25 07/14/2021   ALKPHOS 112 07/14/2021   BILITOT 0.4 07/12/2021   Lab Results  Component Value Date   HGBA1C 5.7 07/14/2021   HGBA1C 6.1 09/06/2020   HGBA1C 5.8 01/07/2020   Lab Results  Component Value Date   INSULIN 9.5 07/12/2021   Lab Results  Component Value Date   TSH 2.32 11/28/2021   No results found for: "CHOL", "HDL", "LDLCALC", "LDLDIRECT", "TRIG", "CHOLHDL" Lab  Results  Component Value Date   VD25OH 36.99 09/06/2020   VD25OH 34.95 01/07/2020   Lab Results  Component Value Date   WBC 7.1 09/29/2020   HGB 12.0 09/29/2020   HCT 36.1 09/29/2020   MCV 80.8 09/29/2020   PLT 242.0 09/29/2020   Lab Results  Component Value Date   IRON 42 02/16/2022   TIBC 333 02/16/2022   FERRITIN 36 02/16/2022   Attestation Statements:   Reviewed by clinician on day of visit: allergies, medications, problem list, medical  history, surgical history, family history, social history, and previous encounter notes.  I have personally spent 41 minutes total time today in preparation, patient care, and documentation for this visit, including the following: review of clinical lab tests; review of medical tests/procedures/services.   I, Trixie Dredge, am acting as transcriptionist for Dennard Nip, MD.  I have reviewed the above documentation for accuracy and completeness, and I agree with the above. -  Dennard Nip, MD

## 2022-08-21 ENCOUNTER — Ambulatory Visit (HOSPITAL_BASED_OUTPATIENT_CLINIC_OR_DEPARTMENT_OTHER): Payer: BC Managed Care – PPO | Admitting: Family Medicine

## 2022-08-22 DIAGNOSIS — R21 Rash and other nonspecific skin eruption: Secondary | ICD-10-CM | POA: Insufficient documentation

## 2022-08-22 NOTE — Assessment & Plan Note (Signed)
Symptoms and presentation consistent with intertrigo most likely from recent increased moisture to genital area. Recommend ketoconazole cream topically twice daily for at least 7 days. May continue if the rash is still present beyond that time. If no improvement, recommend repeat evaluation. OK to use thin layer of desonide topical cream to area to help with pruritis and reduce inflammation. F/U if no improvement in 7 days or any signs of worsening or infection.

## 2022-08-28 ENCOUNTER — Encounter (INDEPENDENT_AMBULATORY_CARE_PROVIDER_SITE_OTHER): Payer: Self-pay

## 2022-08-28 DIAGNOSIS — Z719 Counseling, unspecified: Secondary | ICD-10-CM

## 2022-09-01 ENCOUNTER — Encounter: Payer: Self-pay | Admitting: Registered"

## 2022-09-01 ENCOUNTER — Encounter: Payer: BC Managed Care – PPO | Attending: Internal Medicine | Admitting: Registered"

## 2022-09-01 DIAGNOSIS — R7303 Prediabetes: Secondary | ICD-10-CM | POA: Insufficient documentation

## 2022-09-01 NOTE — Progress Notes (Signed)
On 09/01/22 patient completed the Diabetes Prevention Program course virtually with [facility]. By the end of this session patients are able to complete the following objectives:   Virtual Visit via Video Note  I connected with Kim Kim by a video enabled application and verified that I am speaking with the correct person using two identifiers.  Location: Patient: Home.  Provider: Office.   Learning Objectives: Define fiber and describe the difference between insoluble and soluble fiber  List foods that are good sources of fiber Explain the health benefits of fiber  Describe ways to increase volume of meals and snacks while staying within fat goal.   Goals:  Record weight taken outside of class.  Track foods and beverages eaten each day in the "Food and Activity Tracker," including calories and fat grams for each item.   Track activity type, minutes you were active, and distance you reached each day in the "Food and Activity Tracker."   Follow-Up Plan: Attend next session.  Email completed "Food and Activity Trackers" before next session to be reviewed by Lifestyle Coach.

## 2022-09-02 ENCOUNTER — Other Ambulatory Visit (INDEPENDENT_AMBULATORY_CARE_PROVIDER_SITE_OTHER): Payer: Self-pay | Admitting: Family Medicine

## 2022-09-02 DIAGNOSIS — R7303 Prediabetes: Secondary | ICD-10-CM

## 2022-09-07 ENCOUNTER — Other Ambulatory Visit (INDEPENDENT_AMBULATORY_CARE_PROVIDER_SITE_OTHER): Payer: Self-pay | Admitting: Family Medicine

## 2022-09-07 DIAGNOSIS — R7303 Prediabetes: Secondary | ICD-10-CM

## 2022-09-11 ENCOUNTER — Ambulatory Visit (INDEPENDENT_AMBULATORY_CARE_PROVIDER_SITE_OTHER): Payer: BC Managed Care – PPO | Admitting: Family Medicine

## 2022-09-11 ENCOUNTER — Ambulatory Visit (INDEPENDENT_AMBULATORY_CARE_PROVIDER_SITE_OTHER): Payer: BC Managed Care – PPO

## 2022-09-11 DIAGNOSIS — Z719 Counseling, unspecified: Secondary | ICD-10-CM

## 2022-09-12 ENCOUNTER — Ambulatory Visit (HOSPITAL_BASED_OUTPATIENT_CLINIC_OR_DEPARTMENT_OTHER): Payer: BC Managed Care – PPO | Admitting: Nurse Practitioner

## 2022-09-13 ENCOUNTER — Ambulatory Visit (HOSPITAL_BASED_OUTPATIENT_CLINIC_OR_DEPARTMENT_OTHER): Payer: BC Managed Care – PPO | Admitting: Nurse Practitioner

## 2022-09-13 ENCOUNTER — Ambulatory Visit (INDEPENDENT_AMBULATORY_CARE_PROVIDER_SITE_OTHER): Payer: BC Managed Care – PPO | Admitting: Nurse Practitioner

## 2022-09-13 ENCOUNTER — Encounter (HOSPITAL_BASED_OUTPATIENT_CLINIC_OR_DEPARTMENT_OTHER): Payer: Self-pay | Admitting: Nurse Practitioner

## 2022-09-13 VITALS — BP 108/67 | HR 78 | Temp 97.9°F | Ht 64.0 in | Wt 174.0 lb

## 2022-09-13 DIAGNOSIS — M19041 Primary osteoarthritis, right hand: Secondary | ICD-10-CM | POA: Diagnosis not present

## 2022-09-13 DIAGNOSIS — R21 Rash and other nonspecific skin eruption: Secondary | ICD-10-CM

## 2022-09-13 DIAGNOSIS — K219 Gastro-esophageal reflux disease without esophagitis: Secondary | ICD-10-CM

## 2022-09-13 MED ORDER — DESONIDE 0.05 % EX CREA: 1.0000 | TOPICAL_CREAM | CUTANEOUS | 6 refills | Status: DC | PRN

## 2022-09-13 NOTE — Progress Notes (Signed)
Orma Render, DNP, AGNP-c [facility] [address]  [place], [place] [address] [phone]  Subjective:   Kim Kim is a 66 y.o. female presents to day for evaluation of: Follow-up  There are no diagnoses linked to this encounter.  Arthritis in the right hand  She endorse pain with movement and gripping in the right hand. She would like to come off of prilosec- who would be able to do this effectively. She is interested in SIBO diet to help improve her gut health. She is not sure if there is a nutritionist in the area that could help.  She would like a refill on her desonide.   PMH, Medications, and Allergies reviewed and updated in chart as appropriate.   ROS negative except for what is listed in HPI. Objective:  BP 108/67 (BP Location: Left Arm, Patient Position: Sitting)   Pulse 78   Temp 97.9 F (36.6 C)   Ht '5\' 4"'$  (1.626 m)   Wt 174 lb (78.9 kg)   SpO2 98%   BMI 29.87 kg/m  Physical Exam Vitals and nursing note reviewed.  Constitutional:      Appearance: Normal appearance.  HENT:     Head: Normocephalic.  Neck:     Vascular: No carotid bruit.  Cardiovascular:     Rate and Rhythm: Regular rhythm.     Pulses: Normal pulses.     Heart sounds: Normal heart sounds.  Pulmonary:     Effort: Pulmonary effort is normal.     Breath sounds: Normal breath sounds.  Abdominal:     General: Bowel sounds are normal.     Palpations: Abdomen is soft.     Tenderness: There is abdominal tenderness in the epigastric area.  Musculoskeletal:        General: Tenderness present.     Cervical back: Normal range of motion.     Comments: Tenderness present to the joints in the right hand. Knuckles enlarged. No erythema or warmth present.   Skin:    General: Skin is warm and dry.     Capillary Refill: Capillary refill takes less than 2 seconds.  Neurological:     General: No focal deficit present.     Mental Status: She is  alert.  Psychiatric:        Mood and Affect: Mood normal.           Assessment & Plan:   Problem List Items Addressed This Visit     GERD (gastroesophageal reflux disease) - Primary    She would like to taper off of the PPI at this time. Currently she is on a regular daily dose. Can consider every other day dosing for 30 days then stop medication. May consider famotidine or similar H2 blocker for prn use if needed. If symptoms return daily, recommend evaluation with GI for further recommendations and assessment.       Osteoarthritis    Chronic symptoms of hand pain limiting activities. Recommend voltaren topical gel to see if this is helpful. Gentle exercises can also help prevent stiffness. Tylenol by mouth may also be used if symptoms are severe. Keep hands warm to help prevent worsening pain and stiffness.       Rash and nonspecific skin eruption    Improved with ketoconazole. May continue use PRN. Desonide refilled. May use thin layer as needed.          Orma Render, DNP, AGNP-c 10/28/2022  11:10 PM  History, Medications, Surgery, SDOH, and Family History reviewed and updated as appropriate.

## 2022-09-13 NOTE — Patient Instructions (Addendum)
I will look and see if I can find a SIBO nutritionist that could help.

## 2022-09-21 DIAGNOSIS — J029 Acute pharyngitis, unspecified: Secondary | ICD-10-CM | POA: Diagnosis not present

## 2022-09-21 DIAGNOSIS — Z20822 Contact with and (suspected) exposure to covid-19: Secondary | ICD-10-CM | POA: Diagnosis not present

## 2022-09-21 DIAGNOSIS — R5383 Other fatigue: Secondary | ICD-10-CM | POA: Diagnosis not present

## 2022-09-25 ENCOUNTER — Encounter: Payer: Self-pay | Admitting: Neurology

## 2022-09-25 ENCOUNTER — Telehealth: Payer: Self-pay | Admitting: Neurology

## 2022-09-25 NOTE — Telephone Encounter (Signed)
Called the patient back. Checked with the sleep lab and they will be contacting her soon to schedule a home sleep test since that was approved. Advised the pt of this.  Informed the pt that the lamictal PA on file should be good until feb 2024. Asked if the insurance was changing she states it needs to go under the [employer]. Advised I would complete the PA under that card we have on file. Pt verbalized understanding.

## 2022-09-25 NOTE — Telephone Encounter (Signed)
Pt is calling. Stated she need a prior authorization for medication LAMICTAL 100 MG tablet. Pt is also following up on getting a sleep study. Pt is requesting call back from nurse

## 2022-09-25 NOTE — Telephone Encounter (Signed)
PA completed for the patient on CMM/BCBS [place] KEY:[id number]  Will wait for determination

## 2022-09-26 ENCOUNTER — Telehealth: Payer: Self-pay | Admitting: Neurology

## 2022-09-26 NOTE — Telephone Encounter (Signed)
NPSG-1. Kim Kim: [id number] (exp. 09/14/22 to 03/13/23)   2. [employer]BS [place] no auth req spoke to [place]. ref # [id number]   Patient is scheduled for 12/04/22 at 8 pm.  Mailed and emailed packet to the patient.

## 2022-10-06 DIAGNOSIS — R198 Other specified symptoms and signs involving the digestive system and abdomen: Secondary | ICD-10-CM | POA: Diagnosis not present

## 2022-10-07 DIAGNOSIS — R198 Other specified symptoms and signs involving the digestive system and abdomen: Secondary | ICD-10-CM | POA: Diagnosis not present

## 2022-10-09 ENCOUNTER — Ambulatory Visit: Payer: BC Managed Care – PPO | Admitting: Family Medicine

## 2022-10-13 ENCOUNTER — Encounter: Payer: Self-pay | Admitting: Registered"

## 2022-10-13 ENCOUNTER — Encounter: Payer: BC Managed Care – PPO | Attending: Internal Medicine | Admitting: Registered"

## 2022-10-13 DIAGNOSIS — R7303 Prediabetes: Secondary | ICD-10-CM | POA: Insufficient documentation

## 2022-10-13 NOTE — Progress Notes (Signed)
On 10/13/22 patient completed a post core session of the Diabetes Prevention Program course virtually with [facility]. By the end of this session patients are able to complete the following objectives:   Virtual Visit via Video Note  I connected with Kim Kim by a video enabled application and verified that I am speaking with the correct person using two identifiers.  Location: Patient: Home.  Provider: Office.   Learning Objectives: Describe the difference between a lapse and a relapse. List steps to prevent a lapse from becoming a relapse.  Identify situations that increase risk of having a lapse.  Make a plan to help prevent lapses and recover after a lapse has occurred.   Goals:  Record weight taken outside of class.  Track foods and beverages eaten each day in the "Food and Activity Tracker," including calories and fat grams for each item.   Track activity type, minutes you were active, and distance you reached each day in the "Food and Activity Tracker."   Follow-Up Plan: Attend next session.  Email completed "Food and Activity Trackers" before next session to be reviewed by Lifestyle Coach.

## 2022-10-16 ENCOUNTER — Encounter: Payer: Self-pay | Admitting: Diagnostic Neuroimaging

## 2022-10-16 ENCOUNTER — Telehealth: Payer: Self-pay | Admitting: Neurology

## 2022-10-16 ENCOUNTER — Ambulatory Visit (INDEPENDENT_AMBULATORY_CARE_PROVIDER_SITE_OTHER): Payer: BC Managed Care – PPO | Admitting: Diagnostic Neuroimaging

## 2022-10-16 VITALS — BP 127/83 | HR 86 | Ht 64.0 in | Wt 172.0 lb

## 2022-10-16 DIAGNOSIS — G40109 Localization-related (focal) (partial) symptomatic epilepsy and epileptic syndromes with simple partial seizures, not intractable, without status epilepticus: Secondary | ICD-10-CM

## 2022-10-16 MED ORDER — LAMICTAL 100 MG PO TABS: 100.0000 mg | ORAL_TABLET | Freq: Two times a day (BID) | ORAL | 4 refills | Status: DC

## 2022-10-16 NOTE — Telephone Encounter (Signed)
Pt also sent mychart message. She has appt with Dr. Leta Baptist at 3:30pm today.

## 2022-10-16 NOTE — Telephone Encounter (Signed)
Upon checking the status of this PA, it was cancelled and states an Kim Kim is already on file.  ERQ:[id number]  Approved on May 13, 2021 Effective from 12/15/2019 through 12/13/2022.  Pt should be good for another year with current authorization unless insurance is changing.

## 2022-10-16 NOTE — Telephone Encounter (Signed)
Pt said coming in today, want to compare paperwork for PA for brand name. Would like a call the nurse before appt.

## 2022-10-16 NOTE — Telephone Encounter (Deleted)
Noted, will discuss with pt when she comes in.

## 2022-10-16 NOTE — Patient Instructions (Addendum)
  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

## 2022-10-16 NOTE — Telephone Encounter (Signed)
Please advise pt that current PA on file is active and approved until 11/2022.  If insurance has changed we will need new insurance card.

## 2022-10-16 NOTE — Progress Notes (Signed)
[facility]  PATIENT: Kim Kim DOB: 1956-06-03  REFERRING CLINICIAN: Janith Lima, MD HISTORY FROM: patient  REASON FOR VISIT: follow up   HISTORICAL  CHIEF COMPLAINT:  Chief Complaint  Patient presents with   Follow-up    Pt alone, rm 6, here for follow up. Overall things are stable. She has been seizure free for 14 yrs.     HISTORY OF PRESENT ILLNESS:   UPDATE (10/16/22, VRP): Since last visit, doing well. Symptoms are stable. Tolerating meds.   UPDATE (01/04/21, VRP): Since last visit, doing well. Symptoms are improved. No alleviating or aggravating factors. Tolerating meds (lamictal '100mg'$  twice a day).  UPDATE (12/30/19, VRP): Since last visit, doing well. Some reports of memory loss. Some stress. Also asking about intermittent eye pressure issues when laying face down with massages. No alleviating or aggravating factors. Tolerating lamictal '100mg'$  twice a day. Mild spells have resolved.    PRIOR HPI: 65 year old female here for evaluation of temporal procedures.  In patient's 22s or 5s she was having recurrent dream, feeling like she was a little girl a swing in an old house.  Patient was having intermittent episodes of this strange feeling in her head which she had a hard time describing.  Sometimes she would feel lightheadedness and faint.  In her 71s she had an episode at home where she almost passed out.  In 2007 she had an episode where apparently she turned pale in color and almost fainted.  No dj vu sensations.  She had 1 out of body sensation in high school.  In 2013 patient went to see a neurologist, had EEG which showed some left temporal lobe dysfunction but no clear epileptiform discharges.  Patient was started on topiramate but this caused personality changes and side effects.  She saw another neurologist who started patient on lamotrigine and this seemed to help suppress these episodes.  Patient did well for many years.  She had gone from  having 2-3 episodes a month down to no episodes.  In 2020 patient moved from [place] to [place] and was under increased stress during Covid pandemic and relocation.  In the last few months she has noticed some mild strain sensations in her head which remind her of her prior spells.  She feels a lightheadedness and strangeness in her head.  No convulsions or loss of consciousness.  Patient has been stable on lamotrigine 75 in the morning and 100 at night.  Now patient having approximately 2-3 episodes per week.  Episodes can last a few minutes at a time.  No prior head trauma, meningitis or encephalitis.  No family history of seizures.   REVIEW OF SYSTEMS: Full 14 system review of systems performed and negative with exception of: As per HPI.  ALLERGIES: No Known Allergies  HOME MEDICATIONS: Outpatient Medications Prior to Visit  Medication Sig Dispense Refill   COVID-19 mRNA Vac-TriS, Pfizer, (COMIRNATY) SUSP injection Inject into the muscle. 0.3 mL 0   desonide (DESOWEN) 0.05 % cream Apply 1 Application topically as needed. 60 g 6   influenza vac split quadrivalent PF (FLUARIX) 0.5 ML injection Inject into the muscle. 0.5 mL 0   ketoconazole (NIZORAL) 2 % cream Apply 1 Application topically 2 (two) times daily. To affected areas. 60 g 3   levothyroxine (SYNTHROID) 50 MCG tablet Take 50 mcg by mouth daily before breakfast. 75 Mcg every Sunday     omeprazole (PRILOSEC) 40 MG capsule Take 1 capsule (40 mg total) by mouth  in the morning and at bedtime. Dispense as brand name Prilosec per patient request. 60 capsule 3   rosuvastatin (CRESTOR) 10 MG tablet Take 10 mg by mouth at bedtime.     LAMICTAL 100 MG tablet Take 1 tablet (100 mg total) by mouth 2 (two) times daily. 180 tablet 4   metFORMIN (GLUCOPHAGE) 500 MG tablet Take 1 tablet (500 mg total) by mouth daily with breakfast. 30 tablet 0   No facility-administered medications prior to visit.    PAST MEDICAL HISTORY: Past Medical  History:  Diagnosis Date   Allergy    seasonal allergies   Anemia    hx of   Carotid atherosclerosis    Epilepsy ([place])    Gastric ulcer    hx of   GERD (gastroesophageal reflux disease)    on meds   Growth hormone deficiency ([place])    Hiatal hernia    Hypothyroidism    hx of Hashimoto thyroiditis   Pituitary adenoma ([place])    hx of   Seizures ([place])    temporal lobe   Thyroid disease    on meds   Vitamin D deficiency     PAST SURGICAL HISTORY: Past Surgical History:  Procedure Laterality Date   COLONOSCOPY  12/10/2015   [place] Dr Meredith [place]   ESOPHAGOGASTRODUODENOSCOPY  06/03/2019   Dr Celso Sickle, [place]   WISDOM TOOTH EXTRACTION      FAMILY HISTORY: Family History  Problem Relation Age of Onset   Hypertension Mother    Heart disease Mother    Diabetes Mother    Thyroid disease Mother        hypo   Thyroid nodules Mother    Hyperlipidemia Father    Heart disease Father    Hypertension Father    Hypertension Sister    Hearing loss Sister    Hypertension Sister    Hearing loss Sister    Diabetes Sister    Hypertension Sister    Hearing loss Sister    Heart disease Brother    Esophageal cancer Neg Hx    Colon cancer Neg Hx    Colon polyps Neg Hx    Stomach cancer Neg Hx    Rectal cancer Neg Hx     SOCIAL HISTORY: Social History   Socioeconomic History   Marital status: Married    Spouse name: Gene   Number of children: 3   Years of education: Not on file   Highest education level: Associate degree: occupational, Hotel manager, or vocational program  Occupational History    Comment: NA  Tobacco Use   Smoking status: Never   Smokeless tobacco: Never  Vaping Use   Vaping Use: Never used  Substance and Sexual Activity   Alcohol use: Not Currently    Comment: socially   Drug use: Never   Sexual activity: Yes    Partners: Male    Birth control/protection: Post-menopausal  Other Topics Concern   Not on file  Social History Narrative   She  recently moved from [place]. 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   Caffeine- coffee maybe 1 c, some tea   Social Determinants of Health   Financial Resource Strain: Not on file  Food Insecurity: Not on file  Transportation Needs: Not on file  Physical Activity: Not on file  Stress: Not on file  Social Connections: Not on file  Intimate Partner Violence: Not on file     PHYSICAL EXAM  GENERAL EXAM/CONSTITUTIONAL:  Vitals:  Vitals:   10/16/22 1519  BP: 127/83  Pulse: 86  Weight: 172 lb (78 kg)  Height: '5\' 4"'$  (1.626 m)   Body mass index is 29.52 kg/m. Wt Readings from Last 3 Encounters:  10/16/22 172 lb (78 kg)  09/13/22 174 lb (78.9 kg)  08/09/22 169 lb 3.2 oz (76.7 kg)   Patient is in no distress; well developed, nourished and groomed; neck is supple  CARDIOVASCULAR: Examination of carotid arteries is normal; no carotid bruits Regular rate and rhythm, no murmurs Examination of peripheral vascular system by observation and palpation is normal  EYES: Ophthalmoscopic exam of optic discs and posterior segments is normal; no papilledema or hemorrhages No results found.  MUSCULOSKELETAL: Gait, strength, tone, movements noted in Neurologic exam below  NEUROLOGIC: MENTAL STATUS:      No data to display         awake, alert, oriented to person, place and time recent and remote memory intact normal attention and concentration language fluent, comprehension intact, naming intact fund of knowledge appropriate  CRANIAL NERVE:  2nd - no papilledema on fundoscopic exam 2nd, 3rd, 4th, 6th - pupils equal and reactive to light, visual fields full to confrontation, extraocular muscles intact, no nystagmus 5th - facial sensation symmetric 7th - facial strength symmetric 8th - hearing intact 9th - palate elevates symmetrically, uvula midline 11th - shoulder shrug symmetric 12th - tongue protrusion midline  MOTOR:  normal bulk and tone,  full strength in the BUE, BLE  SENSORY:  normal and symmetric to light touch, temperature, vibration  COORDINATION:  finger-nose-finger, fine finger movements normal  REFLEXES:  deep tendon reflexes present and symmetric  GAIT/STATION:  narrow based gait    DIAGNOSTIC DATA (LABS, IMAGING, TESTING) - I reviewed patient records, labs, notes, testing and imaging myself where available.  Lab Results  Component Value Date   WBC 7.1 09/29/2020   HGB 12.0 09/29/2020   HCT 36.1 09/29/2020   MCV 80.8 09/29/2020   PLT 242.0 09/29/2020      Component Value Date/Time   NA 139 07/14/2021 0000   K 4.2 07/14/2021 0000   CL 104 07/14/2021 0000   CO2 26 (A) 07/14/2021 0000   GLUCOSE 90 07/12/2021 0948   BUN 17 07/14/2021 0000   CREATININE 0.9 07/14/2021 0000   CREATININE 0.99 07/12/2021 0948   CALCIUM 9.8 07/12/2021 0948   PROT 7.9 07/12/2021 0948   ALBUMIN 5.1 (H) 07/12/2021 0948   AST 25 07/14/2021 0000   ALT 17 07/14/2021 0000   ALKPHOS 112 07/14/2021 0000   BILITOT 0.4 07/12/2021 0948   GFRNONAA 73 07/14/2021 0000   No results found for: "CHOL", "HDL", "LDLCALC", "LDLDIRECT", "TRIG", "CHOLHDL" Lab Results  Component Value Date   HGBA1C 5.7 07/14/2021   Lab Results  Component Value Date   VITAMINB12 1,100 02/16/2022   Lab Results  Component Value Date   TSH 2.32 11/28/2021    2013 MRI brain  -Unremarkable brain -Small 1 mm focus of hypoenhancement may represent microadenoma  2013 EEG -Left anterior temporal lobe dysfunction; no epileptiform discharges  2013 Long-term EEG -7-hour video EEG; low voltage occipital spikes possibly epileptiform -No clinical or electrographic seizures   ASSESSMENT AND PLAN  66 y.o. year old female here with intermittent spells of lightheadedness, strange feeling in head, diagnosed with possible temporal lobe seizures in 2013.   Dx:  1. 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    Dispense:  180 tablet    Refill:  4   Return in about 1 year (around 10/17/2023).    Penni Bombard, MD 46/27/0350, 0:93 PM Certified in Neurology, Neurophysiology and Neuroimaging  [facility] [address], [place], [place] [address] [phone]

## 2022-10-20 DIAGNOSIS — R051 Acute cough: Secondary | ICD-10-CM | POA: Diagnosis not present

## 2022-10-20 DIAGNOSIS — J019 Acute sinusitis, unspecified: Secondary | ICD-10-CM | POA: Diagnosis not present

## 2022-10-24 DIAGNOSIS — E039 Hypothyroidism, unspecified: Secondary | ICD-10-CM | POA: Diagnosis not present

## 2022-10-24 DIAGNOSIS — D4410 Neoplasm of uncertain behavior of unspecified adrenal gland: Secondary | ICD-10-CM | POA: Diagnosis not present

## 2022-10-24 DIAGNOSIS — R7302 Impaired glucose tolerance (oral): Secondary | ICD-10-CM | POA: Diagnosis not present

## 2022-10-24 DIAGNOSIS — E063 Autoimmune thyroiditis: Secondary | ICD-10-CM | POA: Diagnosis not present

## 2022-10-28 NOTE — Assessment & Plan Note (Signed)
Chronic symptoms of hand pain limiting activities. Recommend voltaren topical gel to see if this is helpful. Gentle exercises can also help prevent stiffness. Tylenol by mouth may also be used if symptoms are severe. Keep hands warm to help prevent worsening pain and stiffness.

## 2022-10-28 NOTE — Assessment & Plan Note (Addendum)
Improved with ketoconazole. May continue use PRN. Desonide refilled. May use thin layer as needed.

## 2022-10-28 NOTE — Assessment & Plan Note (Signed)
She would like to taper off of the PPI at this time. Currently she is on a regular daily dose. Can consider every other day dosing for 30 days then stop medication. May consider famotidine or similar H2 blocker for prn use if needed. If symptoms return daily, recommend evaluation with GI for further recommendations and assessment.

## 2022-11-06 ENCOUNTER — Ambulatory Visit (HOSPITAL_BASED_OUTPATIENT_CLINIC_OR_DEPARTMENT_OTHER): Payer: BC Managed Care – PPO | Admitting: Family Medicine

## 2022-11-07 ENCOUNTER — Ambulatory Visit (INDEPENDENT_AMBULATORY_CARE_PROVIDER_SITE_OTHER): Payer: BC Managed Care – PPO | Admitting: Family Medicine

## 2022-11-08 ENCOUNTER — Encounter: Payer: Self-pay | Admitting: Internal Medicine

## 2022-11-08 ENCOUNTER — Ambulatory Visit (INDEPENDENT_AMBULATORY_CARE_PROVIDER_SITE_OTHER): Payer: BC Managed Care – PPO | Admitting: Internal Medicine

## 2022-11-08 VITALS — BP 124/78 | HR 65 | Temp 97.8°F | Ht 64.0 in | Wt 170.0 lb

## 2022-11-08 DIAGNOSIS — J069 Acute upper respiratory infection, unspecified: Secondary | ICD-10-CM

## 2022-11-08 DIAGNOSIS — K219 Gastro-esophageal reflux disease without esophagitis: Secondary | ICD-10-CM

## 2022-11-08 NOTE — Patient Instructions (Addendum)
      Medications changes include :   mucinex and zyrtec, can do saline sprays    Take pepcid and taper off the omeprazole slowly     Return if symptoms worsen or fail to improve.

## 2022-11-08 NOTE — Progress Notes (Signed)
Subjective:    Patient ID: Kim Kim, female    DOB: [birth year], 67 y.o.   MRN: [id number]      HPI [place] is here for  Chief Complaint  Patient presents with   Nasal Congestion    Cough has had it since the 19th of December has been taking otc nyquil , mucinex dm, and nasal spray    She is here for an acute visit for cold symptoms.   Her symptoms started around 12/19. Her symptoms have improved - still has fatigue, nasal congestion, PND, hoarseness.  No fever, SOB, cough.     Went to urgent in [place] on 12/24 - covid test was neg, strep was neg.  Rx'd abx, inhaler, cough medication - did not take any.   She has occasional chest pain which is not new - has been related to GERD. Cardiac w/u in the past neg. She is on omeprazole and trying to get off.     She has tried taking nyquil, mucinex     Medications and allergies reviewed with patient and updated if appropriate.  Current Outpatient Medications on File Prior to Visit  Medication Sig Dispense Refill   COVID-19 mRNA Vac-TriS, Pfizer, (COMIRNATY) SUSP injection Inject into the muscle. 0.3 mL 0   desonide (DESOWEN) 0.05 % cream Apply 1 Application topically as needed. 60 g 6   influenza vac split quadrivalent PF (FLUARIX) 0.5 ML injection Inject into the muscle. 0.5 mL 0   ketoconazole (NIZORAL) 2 % cream Apply 1 Application topically 2 (two) times daily. To affected areas. 60 g 3   LAMICTAL 100 MG tablet Take 1 tablet (100 mg total) by mouth 2 (two) times daily. 180 tablet 4   levothyroxine (SYNTHROID) 50 MCG tablet Take 50 mcg by mouth daily before breakfast. 75 Mcg every Sunday     omeprazole (PRILOSEC) 40 MG capsule Take 1 capsule (40 mg total) by mouth in the morning and at bedtime. Dispense as brand name Prilosec per patient request. 60 capsule 3   rosuvastatin (CRESTOR) 10 MG tablet Take 10 mg by mouth at bedtime.     No current facility-administered medications on file prior to visit.    Review of  Systems  Constitutional:  Positive for fatigue. Negative for fever.  HENT:  Positive for congestion, postnasal drip and voice change. Negative for ear pain, sinus pain and sore throat.   Respiratory:  Negative for cough (better), chest tightness, shortness of breath and wheezing.   Cardiovascular:  Positive for chest pain (with GERD).  Neurological:  Negative for light-headedness and headaches.       Objective:   Vitals:   11/08/22 1432  BP: 124/78  Pulse: 65  Temp: 97.8 F (36.6 C)  SpO2: 99%   BP Readings from Last 3 Encounters:  11/08/22 124/78  10/16/22 127/83  09/13/22 108/67   Wt Readings from Last 3 Encounters:  11/08/22 170 lb (77.1 kg)  10/16/22 172 lb (78 kg)  09/13/22 174 lb (78.9 kg)   Body mass index is 29.18 kg/m.    Physical Exam Constitutional:      General: She is not in acute distress.    Appearance: Normal appearance. She is not ill-appearing.  HENT:     Head: Normocephalic and atraumatic.     Right Ear: Tympanic membrane, ear canal and external ear normal.     Left Ear: Tympanic membrane, ear canal and external ear normal.     Mouth/Throat:     Mouth:  Mucous membranes are moist.     Pharynx: No oropharyngeal exudate or posterior oropharyngeal erythema.  Eyes:     Conjunctiva/sclera: Conjunctivae normal.  Cardiovascular:     Rate and Rhythm: Normal rate and regular rhythm.  Pulmonary:     Effort: Pulmonary effort is normal. No respiratory distress.     Breath sounds: Normal breath sounds. No wheezing or rales.  Musculoskeletal:     Cervical back: Neck supple. No tenderness.  Lymphadenopathy:     Cervical: No cervical adenopathy.  Skin:    General: Skin is warm and dry.  Neurological:     Mental Status: She is alert.            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 Esophageal spasms Take prebiotics, probiotics Seeing a naturalopath    I spent 20 minutes dedicated to the care of this patient on the date of this encounter including obtaining history, communicating with the patient - discussing URI treatment and GERD treatment and documenting clinical information in the EHR

## 2022-11-14 ENCOUNTER — Encounter (HOSPITAL_BASED_OUTPATIENT_CLINIC_OR_DEPARTMENT_OTHER): Payer: Self-pay | Admitting: Emergency Medicine

## 2022-11-14 ENCOUNTER — Emergency Department (HOSPITAL_BASED_OUTPATIENT_CLINIC_OR_DEPARTMENT_OTHER)
Admission: EM | Admit: 2022-11-14 | Discharge: 2022-11-14 | Disposition: A | Discharge status: Home / Self Care | Payer: BC Managed Care – PPO | Attending: Emergency Medicine | Admitting: Emergency Medicine

## 2022-11-14 ENCOUNTER — Telehealth: Payer: Self-pay | Admitting: Neurology

## 2022-11-14 ENCOUNTER — Other Ambulatory Visit: Payer: Self-pay

## 2022-11-14 DIAGNOSIS — Z1152 Encounter for screening for COVID-19: Secondary | ICD-10-CM | POA: Diagnosis not present

## 2022-11-14 DIAGNOSIS — R55 Syncope and collapse: Secondary | ICD-10-CM | POA: Insufficient documentation

## 2022-11-14 LAB — CBC WITH DIFFERENTIAL/PLATELET
Abs Immature Granulocytes: 0.04 10*3/uL (ref 0.00–0.07)
Basophils Absolute: 0 10*3/uL (ref 0.0–0.1)
Basophils Relative: 1 %
Eosinophils Absolute: 0.3 10*3/uL (ref 0.0–0.5)
Eosinophils Relative: 4 %
HCT: 36.5 % (ref 36.0–46.0)
Hemoglobin: 11.8 g/dL — ABNORMAL LOW (ref 12.0–15.0)
Immature Granulocytes: 1 %
Lymphocytes Relative: 22 %
Lymphs Abs: 1.6 10*3/uL (ref 0.7–4.0)
MCH: 27.1 pg (ref 26.0–34.0)
MCHC: 32.3 g/dL (ref 30.0–36.0)
MCV: 83.9 fL (ref 80.0–100.0)
Monocytes Absolute: 0.4 10*3/uL (ref 0.1–1.0)
Monocytes Relative: 6 %
Neutro Abs: 4.8 10*3/uL (ref 1.7–7.7)
Neutrophils Relative %: 66 %
Platelets: 195 10*3/uL (ref 150–400)
RBC: 4.35 MIL/uL (ref 3.87–5.11)
RDW: 13.5 % (ref 11.5–15.5)
WBC: 7.3 10*3/uL (ref 4.0–10.5)
nRBC: 0 % (ref 0.0–0.2)

## 2022-11-14 LAB — BASIC METABOLIC PANEL WITH GFR
Anion gap: 8 (ref 5–15)
BUN: 18 mg/dL (ref 8–23)
CO2: 26 mmol/L (ref 22–32)
Calcium: 9.6 mg/dL (ref 8.9–10.3)
Chloride: 104 mmol/L (ref 98–111)
Creatinine, Ser: 0.98 mg/dL (ref 0.44–1.00)
GFR, Estimated: >60 mL/min
Glucose, Bld: 96 mg/dL (ref 70–99)
Potassium: 4.6 mmol/L (ref 3.5–5.1)
Sodium: 138 mmol/L (ref 135–145)

## 2022-11-14 LAB — RESP PANEL BY RT-PCR (RSV, FLU A&B, COVID)  RVPGX2
Influenza A by PCR: NEGATIVE
Influenza B by PCR: NEGATIVE
Resp Syncytial Virus by PCR: NEGATIVE
SARS Coronavirus 2 by RT PCR: NEGATIVE

## 2022-11-14 NOTE — ED Triage Notes (Signed)
Pt reports having a near syncopal episode while working out in the gym, pt reports feeling dizzy, pt reports she has had similar episodes in past

## 2022-11-14 NOTE — Discharge Instructions (Signed)
Eat and drink as well as you can for the next few days.  Please call your cardiologist and set up an appointment to be seen.  I would have you hold off on exercising until you are seen again by the cardiologist.  Please return for chest pain difficulty breathing if you pass out.

## 2022-11-14 NOTE — Telephone Encounter (Signed)
PA completed on CMM/BCBS [id number] Will await response

## 2022-11-14 NOTE — ED Provider Notes (Signed)
[facility] EMERGENCY DEPT Provider Note   CSN: [id number] Arrival date & time: 11/14/22  1053     History  Chief Complaint  Patient presents with   Near Syncope    Kim Kim is a 67 y.o. female.  67 yo F with a cc of a near syncopal event.  Patient was exercising in the gym and felt very dizzy.  This happened to her multiple times in the past.  She tells me that happens every time that she is really pushed by her trainer.  This time is worse than it has been previously.  She now feels better.  She has been sick recently cough congestion going on for couple weeks but she feels like it is getting better.  Denies any chest pain or pressure and denied headache or neck pain.   Near Syncope       Home Medications Prior to Admission medications   Medication Sig Start Date End Date Taking? Authorizing Provider  COVID-19 mRNA Vac-TriS, Pfizer, (COMIRNATY) SUSP injection Inject into the muscle. 08/14/22     desonide (DESOWEN) 0.05 % cream Apply 1 Application topically as needed. 09/13/22   Orma Render, NP  influenza vac split quadrivalent PF (FLUARIX) 0.5 ML injection Inject into the muscle. 08/01/22     ketoconazole (NIZORAL) 2 % cream Apply 1 Application topically 2 (two) times daily. To affected areas. 08/08/22   Orma Render, NP  LAMICTAL 100 MG tablet Take 1 tablet (100 mg total) by mouth 2 (two) times daily. 10/16/22   Penumalli, Earlean Polka, MD  levothyroxine (SYNTHROID) 50 MCG tablet Take 50 mcg by mouth daily before breakfast. 75 Mcg every Sunday    [provider]  omeprazole (PRILOSEC) 40 MG capsule Take 1 capsule (40 mg total) by mouth in the morning and at bedtime. Dispense as brand name Prilosec per patient request. 11/16/20   Cirigliano, Vito V, DO  rosuvastatin (CRESTOR) 10 MG tablet Take 10 mg by mouth at bedtime. 09/22/19   [provider]      Allergies    Patient has no known allergies.    Review of Systems   Review of Systems   Cardiovascular:  Positive for near-syncope.    Physical Exam Updated Vital Signs BP 119/83 (BP Location: Left Arm)   Pulse 90   Temp 97.6 F (36.4 C) (Oral)   Resp 19   Ht '5\' 4"'$  (1.626 m)   Wt 77.1 kg   SpO2 100%   BMI 29.18 kg/m  Physical Exam Vitals and nursing note reviewed.  Constitutional:      General: She is not in acute distress.    Appearance: She is well-developed. She is not diaphoretic.  HENT:     Head: Normocephalic and atraumatic.  Eyes:     Pupils: Pupils are equal, round, and reactive to light.  Cardiovascular:     Rate and Rhythm: Normal rate and regular rhythm.     Heart sounds: No murmur heard.    No friction rub. No gallop.  Pulmonary:     Effort: Pulmonary effort is normal.     Breath sounds: No wheezing or rales.  Abdominal:     General: There is no distension.     Palpations: Abdomen is soft.     Tenderness: There is no abdominal tenderness.  Musculoskeletal:        General: No tenderness.     Cervical back: Normal range of motion and neck supple.  Skin:    General:  Skin is warm and dry.  Neurological:     Mental Status: She is alert and oriented to person, place, and time.  Psychiatric:        Behavior: Behavior normal.     ED Results / Procedures / Treatments   Labs (all labs ordered are listed, but only abnormal results are displayed) Labs Reviewed  CBC WITH DIFFERENTIAL/PLATELET - Abnormal; Notable for the following components:      Result Value   Hemoglobin 11.8 (*)    All other components within normal limits  RESP PANEL BY RT-PCR (RSV, FLU A&B, COVID)  [id number]  BASIC METABOLIC PANEL    EKG EKG Interpretation  Date/Time:  Tuesday November 14 2022 11:13:30 EST Ventricular Rate:  83 PR Interval:  178 QRS Duration: 95 QT Interval:  371 QTC Calculation: 436 R Axis:   4 Text Interpretation: Sinus rhythm No significant change since last tracing Confirmed by Deno Etienne [phone]) on 11/14/2022 1:10:20 PM  Radiology No results  found.  Procedures Procedures    Medications Ordered in ED Medications - No data to display  ED Course/ Medical Decision Making/ A&P                             Medical Decision Making Amount and/or Complexity of Data Reviewed Labs: ordered. ECG/medicine tests: ordered.   67 yo F with a chief complaint of what sounds like a near syncopal event.  She tells me this happens every time that she pushes herself in the gym which is somewhat concerning.  Tells me that she is already had a cardiac workup for this in [place].  She is now feeling better.  Obtain a laboratory evaluation.  Likely follow-up with cardiology as an outpatient.  Laboratory evaluation without anemia no significant electrolyte abnormality.  EKG without concerning finding.  Will have the patient follow-up with her cardiologist in the office.  I will have her abstain from exercise until seen.  1:11 PM:  I have discussed the diagnosis/risks/treatment options with the patient and family.  Evaluation and diagnostic testing in the emergency department does not suggest an emergent condition requiring admission or immediate intervention beyond what has been performed at this time.  They will follow up with PCP, Cards. We also discussed returning to the ED immediately if new or worsening sx occur. We discussed the sx which are most concerning (e.g., sudden worsening pain, fever, inability to tolerate by mouth) that necessitate immediate return. Medications administered to the patient during their visit and any new prescriptions provided to the patient are listed below.  Medications given during this visit Medications - No data to display   The patient appears reasonably screen and/or stabilized for discharge and I doubt any other medical condition or other [facility] requiring further screening, evaluation, or treatment in the ED at this time prior to discharge.          Final Clinical Impression(s) / ED Diagnoses Final  diagnoses:  Near syncope    Rx / DC Orders ED Discharge Orders     None         Deno Etienne, DO 11/14/22 1311

## 2022-11-16 ENCOUNTER — Encounter: Payer: Self-pay | Admitting: Cardiology

## 2022-11-16 ENCOUNTER — Ambulatory Visit (INDEPENDENT_AMBULATORY_CARE_PROVIDER_SITE_OTHER): Payer: BC Managed Care – PPO | Admitting: Internal Medicine

## 2022-11-16 ENCOUNTER — Ambulatory Visit: Payer: BC Managed Care – PPO | Admitting: Cardiology

## 2022-11-16 ENCOUNTER — Encounter: Payer: Self-pay | Admitting: Internal Medicine

## 2022-11-16 VITALS — BP 130/83 | HR 78 | Resp 16 | Ht 64.0 in | Wt 170.0 lb

## 2022-11-16 VITALS — BP 126/68 | HR 70 | Temp 97.7°F | Ht 64.0 in | Wt 170.0 lb

## 2022-11-16 DIAGNOSIS — E538 Deficiency of other specified B group vitamins: Secondary | ICD-10-CM | POA: Diagnosis not present

## 2022-11-16 DIAGNOSIS — L2084 Intrinsic (allergic) eczema: Secondary | ICD-10-CM | POA: Diagnosis not present

## 2022-11-16 DIAGNOSIS — R7303 Prediabetes: Secondary | ICD-10-CM

## 2022-11-16 DIAGNOSIS — E786 Lipoprotein deficiency: Secondary | ICD-10-CM

## 2022-11-16 DIAGNOSIS — R739 Hyperglycemia, unspecified: Secondary | ICD-10-CM

## 2022-11-16 DIAGNOSIS — D539 Nutritional anemia, unspecified: Secondary | ICD-10-CM

## 2022-11-16 DIAGNOSIS — R55 Syncope and collapse: Secondary | ICD-10-CM

## 2022-11-16 DIAGNOSIS — E88819 Insulin resistance, unspecified: Secondary | ICD-10-CM | POA: Diagnosis not present

## 2022-11-16 DIAGNOSIS — D509 Iron deficiency anemia, unspecified: Secondary | ICD-10-CM

## 2022-11-16 DIAGNOSIS — E7800 Pure hypercholesterolemia, unspecified: Secondary | ICD-10-CM

## 2022-11-16 MED ORDER — DESONIDE 0.05 % EX CREA: 1.0000 | TOPICAL_CREAM | CUTANEOUS | 3 refills | Status: AC | PRN

## 2022-11-16 NOTE — Progress Notes (Signed)
Subjective:  Patient ID: Kim Kim, female    DOB: 06/17/1956  Age: 67 y.o. MRN: [id number]  CC: Anemia   HPI Azalie Harbeck presents for f/up -  She recently had a  syncopal episode and has been diagnosed with vasovagal syndrome.  During the workup she was found to be mildly anemic.  Outpatient Medications Prior to Visit  Medication Sig Dispense Refill   ketoconazole (NIZORAL) 2 % cream Apply 1 Application topically 2 (two) times daily. To affected areas. 60 g 3   LAMICTAL 100 MG tablet Take 1 tablet (100 mg total) by mouth 2 (two) times daily. 180 tablet 4   levothyroxine (SYNTHROID) 50 MCG tablet Take 50 mcg by mouth daily before breakfast. 75 Mcg every Sunday     levothyroxine (SYNTHROID) 75 MCG tablet Take 75 mcg by mouth once a week. Only on Sundays     omeprazole (PRILOSEC) 40 MG capsule Take 1 capsule (40 mg total) by mouth in the morning and at bedtime. Dispense as brand name Prilosec per patient request. 60 capsule 3   rosuvastatin (CRESTOR) 10 MG tablet Take 10 mg by mouth at bedtime.     desonide (DESOWEN) 0.05 % cream Apply 1 Application topically as needed. 60 g 6   No facility-administered medications prior to visit.    ROS Review of Systems  Constitutional: Negative.  Negative for diaphoresis and fatigue.  HENT: Negative.    Eyes: Negative.   Respiratory:  Negative for cough, chest tightness, shortness of breath and wheezing.   Cardiovascular:  Negative for chest pain, palpitations and leg swelling.  Gastrointestinal:  Negative for abdominal pain, blood in stool, diarrhea, nausea and vomiting.  Endocrine: Negative.   Genitourinary: Negative.  Negative for difficulty urinating and hematuria.  Musculoskeletal: Negative.   Skin: Negative.   Neurological:  Positive for syncope. Negative for dizziness, weakness and headaches.  Hematological:  Negative for adenopathy. Does not bruise/bleed easily.  Psychiatric/Behavioral: Negative.      Objective:  BP 126/68 (BP  Location: Left Arm, Patient Position: Sitting, Cuff Size: Large)   Pulse 70   Temp 97.7 F (36.5 C) (Oral)   Ht '5\' 4"'$  (1.626 m)   Wt 170 lb (77.1 kg)   SpO2 96%   BMI 29.18 kg/m   BP Readings from Last 3 Encounters:  11/16/22 126/68  11/16/22 130/83  11/14/22 130/83    Wt Readings from Last 3 Encounters:  11/16/22 170 lb (77.1 kg)  11/16/22 170 lb (77.1 kg)  11/14/22 170 lb (77.1 kg)    Physical Exam Vitals reviewed.  Constitutional:      Appearance: She is not ill-appearing.  HENT:     Nose: Nose normal.     Mouth/Throat:     Mouth: Mucous membranes are moist.  Eyes:     General: No scleral icterus.    Conjunctiva/sclera: Conjunctivae normal.  Cardiovascular:     Rate and Rhythm: Normal rate and regular rhythm.     Heart sounds: No murmur heard. Pulmonary:     Effort: Pulmonary effort is normal.     Breath sounds: No stridor. No wheezing, rhonchi or rales.  Abdominal:     General: Abdomen is flat.     Palpations: There is no mass.     Tenderness: There is no abdominal tenderness. There is no guarding.     Hernia: No hernia is present.  Musculoskeletal:     Cervical back: Neck supple.  Lymphadenopathy:     Cervical: No cervical adenopathy.  Neurological:     Mental Status: She is alert.     Lab Results  Component Value Date   WBC 7.3 11/14/2022   HGB 11.8 (L) 11/14/2022   HCT 36.5 11/14/2022   PLT 195 11/14/2022   GLUCOSE 96 11/14/2022   CHOL 118 11/16/2022   TRIG 113.0 11/16/2022   HDL 50.10 11/16/2022   LDLCALC 46 11/16/2022   ALT 11 11/16/2022   AST 18 11/16/2022   NA 138 11/14/2022   K 4.6 11/14/2022   CL 104 11/14/2022   CREATININE 0.98 11/14/2022   BUN 18 11/14/2022   CO2 26 11/14/2022   TSH 2.32 11/28/2021   HGBA1C 6.0 11/16/2022    No results found.  Assessment & Plan:   Latyra was seen today for anemia.  Diagnoses and all orders for this visit:  Intrinsic eczema -     desonide (DESOWEN) 0.05 % cream; Apply 1 Application  topically as needed.  Deficiency anemia- Will evaluate for vitamin deficiencies. -     IBC + Ferritin; Future -     Vitamin B1; Future -     Zinc; Future -     Vitamin B12; Future -     Cancel: CBC with Differential/Platelet; Future -     Reticulocytes; Future -     Folate; Future -     Folate -     Reticulocytes -     Vitamin B12 -     Zinc -     Vitamin B1 -     IBC + Ferritin  B12 deficiency  Low HDL (under 40)- LDL goal achieved. Doing well on the statin  -     Lipid panel; Future -     Hepatic function panel; Future -     Hepatic function panel -     Lipid panel  Prediabetes- A1C is 6.0%. -     Hemoglobin A1c; Future -     Hemoglobin A1c  Insulin resistance -     Hemoglobin A1c; Future -     Hemoglobin A1c  Iron deficiency anemia, unspecified iron deficiency anemia type- I recommended that she start an oral iron supplement and to be screened for GI sources of blood loss. -     Discontinue: Ferric Maltol (ACCRUFER) 30 MG CAPS; Take 1 capsule (30 mg total) by mouth in the morning and at bedtime.   I am having Kim Kim maintain her levothyroxine, rosuvastatin, omeprazole, ketoconazole, LaMICtal, levothyroxine, and desonide.  Meds ordered this encounter  Medications   desonide (DESOWEN) 0.05 % cream    Sig: Apply 1 Application topically as needed.    Dispense:  60 g    Refill:  3   DISCONTD: Ferric Maltol (ACCRUFER) 30 MG CAPS    Sig: Take 1 capsule (30 mg total) by mouth in the morning and at bedtime.    Dispense:  180 capsule    Refill:  0     Follow-up: Return in about 6 months (around 05/17/2023).  Scarlette Calico, MD

## 2022-11-16 NOTE — Progress Notes (Signed)
Primary Physician/Referring:  Janith Lima, MD  Patient ID: Kim Kim, female    DOB: 29-Apr-1956, 67 y.o.   MRN: [id number]  Chief Complaint  Patient presents with   Chest Pain   Loss of Consciousness    Exercised induced   Hospitalization Follow-up   HPI:    Kim Kim  is a 67 y.o. Caucasian female with a history of GERD, B12 deficiency, hiatal hernia, PUD with gastric ulcer in 2020 due to NSAID, history of pituitary adenoma, seizure disorder, history of Hashimoto's thyroiditis with subsequent hypothyroidism, hyperlipidemia, diabetes mellitus, family history of premature coronary artery disease.  Patient has had chronic exertional chest pain suggestive of angina but has had extensive cardiac workup in the past and has been negative, coronary CTA normal, coronary calcium score 0 and cardiopulmonary stress test except for exercise-induced LBBB and chest pain, had good exercise tolerance. GI workup has been negative.  I last seen her in 2022, she made an appointment to see me in view of recent onset of near syncope and was seen in the emergency room 2 days ago on 11/14/2022. Patient was exercising in the gym and felt very dizzy. This happened to her multiple times especially when the trainer was trying to push her to do more activity.  Eventually she had an episode of syncope and loss of bladder control.  Presently she is asymptomatic, states that she is still recuperating from bronchitis that she had in the last week of December.  Husband is present at the bedside.  Past Medical History:  Diagnosis Date   Allergy    seasonal allergies   Anemia    hx of   Carotid atherosclerosis    Epilepsy ([place])    Gastric ulcer    hx of   GERD (gastroesophageal reflux disease)    on meds   Growth hormone deficiency ([place])    Hiatal hernia    Hypothyroidism    hx of Hashimoto thyroiditis   Pituitary adenoma ([place])    hx of   Seizures ([place])    temporal lobe   Thyroid disease    on meds    Vitamin D deficiency    Past Surgical History:  Procedure Laterality Date   COLONOSCOPY  12/10/2015   [place] Dr Meredith [place]   ESOPHAGOGASTRODUODENOSCOPY  06/03/2019   Dr Celso Sickle, [place]   WISDOM TOOTH EXTRACTION       Social History   Tobacco Use   Smoking status: Never   Smokeless tobacco: Never  Substance Use Topics   Alcohol use: Not Currently    Comment: socially   Marital Status: Married  ROS  Review of Systems  Cardiovascular:  Positive for near-syncope. Negative for chest pain, dyspnea on exertion and leg swelling.  Gastrointestinal:  Positive for heartburn. Negative for melena.   Objective  Blood pressure 130/83, pulse 78, resp. rate 16, height '5\' 4"'$  (1.626 m), weight 170 lb (77.1 kg), SpO2 99 %.     11/16/2022    1:10 PM 11/16/2022    1:09 PM 11/16/2022    1:08 PM  Vitals with BMI  Systolic 097 353 299  Diastolic 83 83 84  Pulse 78 71 66     Physical Exam Neck:     Vascular: No carotid bruit or JVD.  Cardiovascular:     Rate and Rhythm: Normal rate and regular rhythm.     Pulses: Intact distal pulses.     Heart sounds: Normal heart sounds. No murmur heard.  No gallop.  Pulmonary:     Effort: Pulmonary effort is normal.     Breath sounds: Normal breath sounds.  Abdominal:     General: Bowel sounds are normal.     Palpations: Abdomen is soft.  Musculoskeletal:     Right lower leg: No edema.     Left lower leg: No edema.    Laboratory examination:   Recent Labs    11/14/22 1207  NA 138  K 4.6  CL 104  CO2 26  GLUCOSE 96  BUN 18  CREATININE 0.98  CALCIUM 9.6  GFRNONAA >60      Latest Ref Rng & Units 11/14/2022   12:07 PM 07/14/2021   12:00 AM 07/12/2021    9:48 AM  CMP  Glucose 70 - 99 mg/dL 96   90   BUN 8 - 23 mg/dL '18  17     16   '$ Creatinine 0.44 - 1.00 mg/dL 0.98  0.9     0.99   Sodium 135 - 145 mmol/L 138  139     140   Potassium 3.5 - 5.1 mmol/L 4.6  4.2     4.6   Chloride 98 - 111 mmol/L 104  104     98   CO2 22 - 32  mmol/L '26  26     26   '$ Calcium 8.9 - 10.3 mg/dL 9.6   9.8   Total Protein 6.0 - 8.5 g/dL   7.9   Total Bilirubin 0.0 - 1.2 mg/dL   0.4   Alkaline Phos 25 - 125  112     119   AST 13 - 35  25     26   ALT 7 - 35  17     16      This result is from an external source.      Latest Ref Rng & Units 11/14/2022   12:07 PM 09/29/2020    1:36 PM 01/07/2020   11:19 AM  CBC  WBC 4.0 - 10.5 K/uL 7.3  7.1  7.0   Hemoglobin 12.0 - 15.0 g/dL 11.8  12.0  12.5   Hematocrit 36.0 - 46.0 % 36.5  36.1  37.6   Platelets 150 - 400 K/uL 195  242.0  250.0    No results found for: "CHOL", "HDL", "LDLCALC", "LDLDIRECT", "TRIG", "CHOLHDL"   HEMOGLOBIN A1C Lab Results  Component Value Date   HGBA1C 5.7 07/14/2021   TSH Recent Labs    11/28/21 1053  TSH 2.32    External labs:   Labs 04/27/2022:  Vitamin D 62, TSH normal at 1.94.  A1c 5.7%.  Total cholesterol 127, triglycerides 84, HDL 54, LDL 56, non-HDL cholesterol 73.   Medications and allergies  No Known Allergies   Current Outpatient Medications:    desonide (DESOWEN) 0.05 % cream, Apply 1 Application topically as needed., Disp: 60 g, Rfl: 6   ketoconazole (NIZORAL) 2 % cream, Apply 1 Application topically 2 (two) times daily. To affected areas., Disp: 60 g, Rfl: 3   LAMICTAL 100 MG tablet, Take 1 tablet (100 mg total) by mouth 2 (two) times daily., Disp: 180 tablet, Rfl: 4   levothyroxine (SYNTHROID) 50 MCG tablet, Take 50 mcg by mouth daily before breakfast. 75 Mcg every Sunday, Disp: , Rfl:    levothyroxine (SYNTHROID) 75 MCG tablet, Take 75 mcg by mouth once a week. Only on Sundays, Disp: , Rfl:    omeprazole (PRILOSEC) 40 MG capsule, Take 1 capsule (40  mg total) by mouth in the morning and at bedtime. Dispense as brand name Prilosec per patient request., Disp: 60 capsule, Rfl: 3   rosuvastatin (CRESTOR) 10 MG tablet, Take 10 mg by mouth at bedtime., Disp: , Rfl:    Radiology:   Chest x-ray PA and lateral view 12/10/2020: Heart and  mediastinal contours are within normal limits.  Both lungs are clear.  Cardiac Studies:   Calcium score 11/13/17: Total score 0.  Carotid duplex, 01/08/18: Minimal atherosclerotic plaque in the carotid artery bifurcations bilaterally  Stress echo, 05/01/18: She completed 7 minutes, 45 seconds, 8.4 METs, no ischemia.  Coronary CT angiogram 12/23/2018: Normal coronary arteries. Right dominant circulation. Coronary calcium score not done. (Calcium score of 0 on 11/13/2017).  Echocardiogram 02/12/2019:    1. The left ventricle has normal systolic function, with an ejection fraction of 55-60%. The cavity size was normal. Left ventricular diastolic Doppler parameters are consistent with impaired relaxation. No evidence of left ventricular regional wall  motion abnormalities.  2. The right ventricle has normal systolic function. The cavity was normal. There is no increase in right ventricular wall thickness.  3. The aortic valve is tricuspid.  4. The aortic root is normal in size and structure.  Cardiopulmonary stress test 01/13/2021: Exercise testing with gas exchange indicates a normal functional capacity when compared to matched sedentary norms. There is no clear cardiopulmonary limitation. VE/VCO2 slope is slightly elevated and could indicate increased pulmonary pressures during exercise. Note exercise induced LBBB. Normal BP response.    Exercise Time:    11:45   Speed (mph): 3.0       Grade (%): 15.0    RPE: 17  Reason stopped: Chest pain (9/10).  Additional symptoms: Dyspnea (3/10)   EKG:   EKG 11/16/2022: Normal sinus rhythm at a rate of 70 bpm, incomplete right bundle branch block.  Normal EKG.  No change from 07/12/2021.  Assessment     ICD-10-CM   1. Vasovagal syncope  R55 EKG 12-Lead    2. Pure hypercholesterolemia  E78.00     3. Hyperglycemia  R73.9       Medications Discontinued During This Encounter  Medication Reason   influenza vac split quadrivalent PF (FLUARIX) 0.5 ML  injection Completed Course   COVID-19 mRNA Vac-TriS, Pfizer, (COMIRNATY) SUSP injection Completed Course    No orders of the defined types were placed in this encounter.  Orders Placed This Encounter  Procedures   EKG 12-Lead   Recommendations:   Lynasia Meloche is a 67 y.o. Caucasian female with a history of GERD, B12 deficiency, hiatal hernia, PUD with gastric ulcer in 2020 due to NSAID, history of pituitary adenoma, seizure disorder, history of Hashimoto's thyroiditis with subsequent hypothyroidism, hyperlipidemia, prediabetes mellitus, family history of premature coronary artery disease.  Patient has had chronic exertional chest pain suggestive of angina but has had extensive cardiac workup in the past and has been negative, coronary CTA normal, coronary calcium score 0 and cardiopulmonary stress test except for exercise-induced LBBB and chest pain, had good exercise tolerance. GI workup has been negative.  I last seen her in 2022, she made an appointment to see me in view of recent onset of near syncope and was seen in the emergency room 2 days ago on 11/14/2022. Patient was exercising in the gym and felt very dizzy. This happened to her multiple times especially when the trainer was trying to push her to do more activity.  Eventually she had an episode of syncope and  loss of bladder control.  1. Vasovagal syncope Her symptoms of dizziness while doing heavy weightlifting while holding breath is very classic for vasovagal syncope.  I performed carotid artery massage and I could not elicit any symptoms.  She has normal physical exam.  She did have loss of bladder control after she had an episode of syncope, and although she has history of seizures, she was completely aware of her surroundings immediately and hence do not suspect seizure disorder.  ED records evaluated by me today and labs reviewed.  I have simply reassured them.  I also discussed with him regarding avoidance of dehydration, to drink  Pedialyte before every exercise, counterpressure maneuvers discussed.  2. Pure hypercholesterolemia Patient previously had mild hyperlipidemia and also elevated triglycerides, all this is normalized.  She is now on a statin.  She is to have microvascular angina pectoris which has completely resolved since being on a statin.  Continue the same.  3. Hyperglycemia She does have hyperglycemia but recent labs reviewed, with weight loss and diet, A1c is also improved.  Husband is present, all questions answered.  This was a 40-minute office visit encounter.  I spent 30 minutes face-to-face and I spent 10 minutes to review her records.    Adrian Prows, MD, [facility] 11/16/2022, 1:49 PM Office: [phone]

## 2022-11-16 NOTE — Patient Instructions (Signed)

## 2022-11-17 ENCOUNTER — Encounter: Payer: BC Managed Care – PPO | Admitting: Registered"

## 2022-11-17 ENCOUNTER — Encounter: Payer: Self-pay | Admitting: Internal Medicine

## 2022-11-17 ENCOUNTER — Telehealth: Payer: Self-pay | Admitting: Internal Medicine

## 2022-11-17 LAB — LIPID PANEL
Cholesterol: 118 mg/dL (ref 0–200)
HDL: 50.1 mg/dL (ref >39.00)
LDL Cholesterol: 46 mg/dL (ref 0–99)
NonHDL: 68.1
Total CHOL/HDL Ratio: 2
Triglycerides: 113 mg/dL (ref 0.0–149.0)
VLDL: 22.6 mg/dL (ref 0.0–40.0)

## 2022-11-17 LAB — HEPATIC FUNCTION PANEL
ALT: 11 U/L (ref 0–35)
AST: 18 U/L (ref 0–37)
Albumin: 4.4 g/dL (ref 3.5–5.2)
Alkaline Phosphatase: 95 U/L (ref 39–117)
Bilirubin, Direct: 0.1 mg/dL (ref 0.0–0.3)
Total Bilirubin: 0.3 mg/dL (ref 0.2–1.2)
Total Protein: 7.8 g/dL (ref 6.0–8.3)

## 2022-11-17 LAB — IBC + FERRITIN
Ferritin: 21.8 ng/mL (ref 10.0–291.0)
Iron: 38 ug/dL — ABNORMAL LOW (ref 42–145)
Saturation Ratios: 10.3 % — ABNORMAL LOW (ref 20.0–50.0)
TIBC: 369.6 ug/dL (ref 250.0–450.0)
Transferrin: 264 mg/dL (ref 212.0–360.0)

## 2022-11-17 LAB — HEMOGLOBIN A1C: Hgb A1c MFr Bld: 6 % (ref 4.6–6.5)

## 2022-11-17 LAB — FOLATE: Folate: 20.3 ng/mL (ref >5.9)

## 2022-11-17 LAB — VITAMIN B12: Vitamin B-12: >1500 pg/mL — ABNORMAL HIGH (ref 211–911)

## 2022-11-17 MED ORDER — ACCRUFER 30 MG PO CAPS: 1.0000 | ORAL_CAPSULE | Freq: Two times a day (BID) | ORAL | 0 refills | Status: DC

## 2022-11-17 NOTE — Telephone Encounter (Signed)
Patient called about Ferric Maltol (ACCRUFER) 30 MG CAPS, She said it was sent to [facility] and it needs to be sent to  [id number]- [place], [place] - [address]

## 2022-11-18 ENCOUNTER — Other Ambulatory Visit: Payer: Self-pay | Admitting: Internal Medicine

## 2022-11-18 DIAGNOSIS — D509 Iron deficiency anemia, unspecified: Secondary | ICD-10-CM

## 2022-11-18 MED ORDER — ACCRUFER 30 MG PO CAPS: 1.0000 | ORAL_CAPSULE | Freq: Two times a day (BID) | ORAL | 0 refills | Status: DC

## 2022-11-20 DIAGNOSIS — R198 Other specified symptoms and signs involving the digestive system and abdomen: Secondary | ICD-10-CM | POA: Diagnosis not present

## 2022-11-21 ENCOUNTER — Other Ambulatory Visit: Payer: Self-pay | Admitting: Internal Medicine

## 2022-11-21 DIAGNOSIS — E611 Iron deficiency: Secondary | ICD-10-CM

## 2022-11-21 LAB — RETICULOCYTES
ABS Retic: 52560 cells/uL (ref 20000–80000)
Retic Ct Pct: 1.2 %

## 2022-11-21 LAB — ZINC: Zinc: 72 ug/dL (ref 60–130)

## 2022-11-21 LAB — VITAMIN B1: Vitamin B1 (Thiamine): 13 nmol/L (ref 8–30)

## 2022-11-21 MED ORDER — FERROUS SULFATE 325 (65 FE) MG PO TABS: 325.0000 mg | ORAL_TABLET | Freq: Two times a day (BID) | ORAL | 0 refills | Status: DC

## 2022-11-23 ENCOUNTER — Ambulatory Visit (INDEPENDENT_AMBULATORY_CARE_PROVIDER_SITE_OTHER): Payer: BC Managed Care – PPO | Admitting: Family Medicine

## 2022-11-23 ENCOUNTER — Encounter (INDEPENDENT_AMBULATORY_CARE_PROVIDER_SITE_OTHER): Payer: Self-pay

## 2022-11-23 NOTE — Patient Instructions (Addendum)
     Try a liquid iron - floradix    Get the chest xray downstairs.    Medications changes include :   slow release iron     Return if symptoms worsen or fail to improve.

## 2022-11-23 NOTE — Progress Notes (Signed)
Subjective:    Patient ID: Kim Kim, female    DOB: 29-Mar-1956, 67 y.o.   MRN: [id number]      HPI Lyndal is here for  Chief Complaint  Patient presents with   Follow-up    ED follow up      ED 1/16 - syncope. She was exercising at gym and felt very dizzy.  The has occurred many times in the past - when pushed by her trainer.  Work up in the past in [place] was negative.  This time was worse.  She had been sick with cough, congestion x couple of weeks but was getting better. No chest pain, HA, neck pain.   RSV, flu, covid tests neg.  Cbc, bmp nml.  EKG wnl -no change  She has seen cardiology who believes her episode was vasovagal.  She is also seen her primary care physician and had blood work done-iron level slightly low.  She has been so weak has been tired.  She is still coughing from her COVID in December.  She does have some congestion in her chest.  She is lightheaded at times and has a funny feeling in her head, but no true vertigo.  No new cold symptoms.   She was placed on iron, but it is causing significant constipation.  Medications and allergies reviewed with patient and updated if appropriate.  Current Outpatient Medications on File Prior to Visit  Medication Sig Dispense Refill   desonide (DESOWEN) 0.05 % cream Apply 1 Application topically as needed. 60 g 3   ferrous sulfate 325 (65 FE) MG tablet Take 1 tablet (325 mg total) by mouth 2 (two) times daily with a meal. 180 tablet 0   ketoconazole (NIZORAL) 2 % cream Apply 1 Application topically 2 (two) times daily. To affected areas. 60 g 3   LAMICTAL 100 MG tablet Take 1 tablet (100 mg total) by mouth 2 (two) times daily. 180 tablet 4   levothyroxine (SYNTHROID) 50 MCG tablet Take 50 mcg by mouth daily before breakfast. 75 Mcg every Sunday     levothyroxine (SYNTHROID) 75 MCG tablet Take 75 mcg by mouth once a week. Only on Sundays     omeprazole (PRILOSEC) 40 MG capsule Take 1 capsule (40 mg total) by mouth  in the morning and at bedtime. Dispense as brand name Prilosec per patient request. 60 capsule 3   rosuvastatin (CRESTOR) 10 MG tablet Take 10 mg by mouth at bedtime.     No current facility-administered medications on file prior to visit.    Review of Systems  Constitutional:  Positive for fatigue. Negative for fever.  HENT:  Negative for congestion and sinus pressure.   Respiratory:  Positive for cough (chest congestion) and chest tightness. Negative for shortness of breath and wheezing.   Cardiovascular:  Negative for chest pain (heaviness) and palpitations.  Gastrointestinal:  Positive for constipation.  Genitourinary:  Negative for dysuria and frequency.  Neurological:  Positive for light-headedness. Negative for dizziness and headaches.       Objective:   Vitals:   11/24/22 1112  BP: 118/70  Pulse: 75  Temp: 98.2 F (36.8 C)  SpO2: 97%   BP Readings from Last 3 Encounters:  11/24/22 118/70  11/16/22 126/68  11/16/22 130/83   Wt Readings from Last 3 Encounters:  11/24/22 169 lb (76.7 kg)  11/16/22 170 lb (77.1 kg)  11/16/22 170 lb (77.1 kg)   Body mass index is 29.01 kg/m.    Physical  Exam Constitutional:      General: She is not in acute distress.    Appearance: Normal appearance.  HENT:     Head: Normocephalic and atraumatic.  Eyes:     Conjunctiva/sclera: Conjunctivae normal.  Cardiovascular:     Rate and Rhythm: Normal rate and regular rhythm.     Heart sounds: Normal heart sounds. No murmur heard. Pulmonary:     Effort: Pulmonary effort is normal. No respiratory distress.     Breath sounds: Normal breath sounds. No wheezing.  Musculoskeletal:     Cervical back: Neck supple.     Right lower leg: No edema.     Left lower leg: No edema.  Lymphadenopathy:     Cervical: No cervical adenopathy.  Skin:    General: Skin is warm and dry.     Findings: No rash.  Neurological:     Mental Status: She is alert. Mental status is at baseline.  Psychiatric:         Mood and Affect: Mood normal.        Behavior: Behavior normal.           Lab Results  Component Value Date   WBC 7.3 11/14/2022   HGB 11.8 (L) 11/14/2022   HCT 36.5 11/14/2022   PLT 195 11/14/2022   GLUCOSE 96 11/14/2022   CHOL 118 11/16/2022   TRIG 113.0 11/16/2022   HDL 50.10 11/16/2022   LDLCALC 46 11/16/2022   ALT 11 11/16/2022   AST 18 11/16/2022   NA 138 11/14/2022   K 4.6 11/14/2022   CL 104 11/14/2022   CREATININE 0.98 11/14/2022   BUN 18 11/14/2022   CO2 26 11/14/2022   TSH 2.32 11/28/2021   HGBA1C 6.0 11/16/2022         Assessment & Plan:    Near syncope: Has had several episodes of near syncope-typically occurs when she is pushed hard by her trainer Recent episode on 1/16 occurred when she was at the gym and she did go to the emergency room-respiratory virus test negative, CBC, CMP normal, EKG within normal limits and without change.  No concerning symptoms. Has had workup in the past in [place] by cardiology.  She has seen cardiology already and it is thought that the episode was a vasovagal episode.  She is also seeing her primary care physician. Will continue to drink plenty of fluids and is now on iron for low iron  Iron deficiency: Iron level slightly low Started on iron supplementation-not tolerating secondary to severe constipation Trial of slow release iron, take stool softener If she does not tolerate above there is a more natural iron she can try taking  Fatigue: Has been experiencing increased fatigue and weakness-iron level could be the explanation, but it still mildly low if he surprised if there was not something else going on Following with a natural path Thyroid level has been checked and is okay Supplement iron and then consider rechecking it after few weeks  Persisting follow-up: Will check a chest x-ray today just to make sure there is no pneumonia Still having cough and some chest congestion and wonders about help to  help relieve that Will prescribe prednisone taper

## 2022-11-24 ENCOUNTER — Telehealth: Payer: Self-pay | Admitting: Internal Medicine

## 2022-11-24 ENCOUNTER — Ambulatory Visit (INDEPENDENT_AMBULATORY_CARE_PROVIDER_SITE_OTHER): Payer: BC Managed Care – PPO

## 2022-11-24 ENCOUNTER — Encounter: Payer: Self-pay | Admitting: Internal Medicine

## 2022-11-24 ENCOUNTER — Ambulatory Visit (INDEPENDENT_AMBULATORY_CARE_PROVIDER_SITE_OTHER): Payer: BC Managed Care – PPO | Admitting: Internal Medicine

## 2022-11-24 VITALS — BP 118/70 | HR 75 | Temp 98.2°F | Ht 64.0 in | Wt 169.0 lb

## 2022-11-24 DIAGNOSIS — R052 Subacute cough: Secondary | ICD-10-CM | POA: Diagnosis not present

## 2022-11-24 DIAGNOSIS — E611 Iron deficiency: Secondary | ICD-10-CM

## 2022-11-24 DIAGNOSIS — R5383 Other fatigue: Secondary | ICD-10-CM

## 2022-11-24 DIAGNOSIS — R55 Syncope and collapse: Secondary | ICD-10-CM | POA: Diagnosis not present

## 2022-11-24 DIAGNOSIS — R059 Cough, unspecified: Secondary | ICD-10-CM | POA: Diagnosis not present

## 2022-11-24 MED ORDER — SLOW RELEASE IRON 45 MG PO TBCR: 1.0000 | EXTENDED_RELEASE_TABLET | Freq: Every day | ORAL | 1 refills | Status: DC

## 2022-11-24 MED ORDER — PREDNISONE 10 MG PO TABS: ORAL_TABLET | ORAL | 0 refills | Status: DC

## 2022-11-24 NOTE — Telephone Encounter (Signed)
Spoke with patient and info given.

## 2022-11-24 NOTE — Telephone Encounter (Signed)
Let her know her preliminary chest x-ray shows no pneumonia.  I did send in a prednisone taper for her to try to see if that helps

## 2022-11-28 ENCOUNTER — Encounter: Payer: Self-pay | Admitting: Nurse Practitioner

## 2022-11-28 ENCOUNTER — Ambulatory Visit: Payer: BC Managed Care – PPO | Admitting: Cardiology

## 2022-11-28 ENCOUNTER — Telehealth: Payer: Self-pay | Admitting: Diagnostic Neuroimaging

## 2022-11-28 ENCOUNTER — Ambulatory Visit (INDEPENDENT_AMBULATORY_CARE_PROVIDER_SITE_OTHER): Payer: BC Managed Care – PPO | Admitting: Nurse Practitioner

## 2022-11-28 VITALS — BP 110/68 | HR 76 | Ht 64.0 in | Wt 174.0 lb

## 2022-11-28 DIAGNOSIS — M810 Age-related osteoporosis without current pathological fracture: Secondary | ICD-10-CM

## 2022-11-28 DIAGNOSIS — K6389 Other specified diseases of intestine: Secondary | ICD-10-CM

## 2022-11-28 DIAGNOSIS — K5904 Chronic idiopathic constipation: Secondary | ICD-10-CM

## 2022-11-28 DIAGNOSIS — M8580 Other specified disorders of bone density and structure, unspecified site: Secondary | ICD-10-CM

## 2022-11-28 DIAGNOSIS — R55 Syncope and collapse: Secondary | ICD-10-CM

## 2022-11-28 DIAGNOSIS — M19041 Primary osteoarthritis, right hand: Secondary | ICD-10-CM

## 2022-11-28 DIAGNOSIS — K259 Gastric ulcer, unspecified as acute or chronic, without hemorrhage or perforation: Secondary | ICD-10-CM

## 2022-11-28 DIAGNOSIS — E063 Autoimmune thyroiditis: Secondary | ICD-10-CM

## 2022-11-28 NOTE — Progress Notes (Signed)
Worthy Keeler, DNP, AGNP-c [facility]  [address] [place], [place] [address] [phone]  ESTABLISHED PATIENT- Chronic Health and/or Follow-Up Visit  Blood pressure 110/68, pulse 76, height '5\' 4"'$  (1.626 m), weight 174 lb (78.9 kg).   Kim Kim is a 67 y.o. year old female presenting today for evaluation and management of the following:  Syncope She tells me she has recently had an episode of syncope during exercise with heavy exertion. She reports that she experienced significant dizziness and lost consciousness. During the event she did have incontinence of urine. She tells me she has had similar events in the past and had a complete workup in [place], which was negative. She was recovering from illness at the time of the event. She was seen in the ED and subsequently evaluated with cardiology and internal medicine. Labs were drawn and have been reviewed today with no noted abnormalities present. She is asymptomatic at this time.  Cardiac workup includes cardiac calcium score (0) Stress echo with no ischemia Coronary CT angio with normal coronary arteries Echo with normal EF, no evidence of regurgitation or thickening of ventricles. Normal aortic root.  Stress test in 12/2020 showed no clear cardiopulmonary limitation with slight elevation in VE/VCO2 slope. 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  She tells me that she is having pain in her hands - specifically in the right hand- of arthritic nature. She has been working out routinely with weights and using her hands more often. She is not having any warmth, erythema, or localized inflammation. No Heberden nodes.   She is still taking prilosec for GERD symptoms, but reports her symptoms are not well controlled at this time. She is in need of referral to GI for general care.   All ROS negative with exception of what is listed above.   PHYSICAL EXAM Physical Exam Vitals and nursing  note reviewed.  Constitutional:      General: She is not in acute distress.    Appearance: Normal appearance.  HENT:     Head: Normocephalic.  Eyes:     Extraocular Movements: Extraocular movements intact.     Conjunctiva/sclera: Conjunctivae normal.     Pupils: Pupils are equal, round, and reactive to light.  Neck:     Vascular: No carotid bruit.  Cardiovascular:     Rate and Rhythm: Normal rate and regular rhythm.     Pulses: Normal pulses.     Heart sounds: Normal heart sounds. No murmur heard. Pulmonary:     Effort: Pulmonary effort is normal.     Breath sounds: Normal breath sounds. No wheezing.  Abdominal:     General: Bowel sounds are normal. There is no distension.     Palpations: Abdomen is soft.     Tenderness: There is no abdominal tenderness. There is no guarding.  Musculoskeletal:        General: Normal range of motion.     Cervical back: Normal range of motion and neck supple.     Right lower leg: No edema.     Left lower leg: No edema.     Comments: Right hand tenderness of the DIP and PIP joint spaces with no signs of erythema, warmth, or edema.   Lymphadenopathy:     Cervical: No cervical adenopathy.  Skin:    General: Skin is warm and dry.     Capillary Refill: Capillary refill takes less than 2 seconds.  Neurological:     General: No focal  deficit present.     Mental Status: She is alert and oriented to person, place, and time.  Psychiatric:        Mood and Affect: Mood normal.        Behavior: Behavior normal.        Thought Content: Thought content normal.        Judgment: Judgment normal.     PLAN Problem List Items Addressed This Visit     Hashimoto's thyroiditis    Chronic. Managed with levothyroxine. No alarm symptoms present. No refills needed.       Bacterial overgrowth syndrome    Seeing integrative medicine for management of this. Improved symptoms.       Relevant Orders   Ambulatory referral to Gastroenterology   Chronic  idiopathic constipation    Conservative management for at least 4 weeks with no improvement of symptoms. Chronic in nature. Will send referral for GI for further evaluation and management. May consider prescription options, such as Linzess/Amtiza/Trulance once underlying etiology is ruled out.       Relevant Medications   docusate sodium (COLACE) 100 MG capsule   Other Relevant Orders   Ambulatory referral to Gastroenterology   Gastric ulcer    Historic. Currently on prilosec. Symptoms of GERD continue. GI referral placed.       Relevant Orders   Ambulatory referral to Gastroenterology   Vasovagal syncope    Recent syncopal episode during weight training with complete LOC and incontinence. No injury. Complete work-up negative. Symptoms consistent with vasovagal syncopal episode. Recommend that she avoid straining to that point in the future. Encouraged her to increase water and sodium intake, particularly during exercise. Compression stockings may also be beneficial. F/U with new or worsening symptoms.       Osteoarthritis   Relevant Orders   Ambulatory referral to Rheumatology   Osteopenia   Relevant Orders   Ambulatory referral to Rheumatology   Osteoporosis - Primary   Relevant Orders   Ambulatory referral to Rheumatology    Return if symptoms worsen or fail to improve.   Worthy Keeler, DNP, AGNP-c 11/28/2022  2:29 PM

## 2022-11-28 NOTE — Telephone Encounter (Signed)
Pt has been evaluated in ED and through PCP. I will send this to Dr Leta Baptist for him to review and determine if medication adjustments are necessary at this time. The notes did mention syncopal episode and didn't mention sz as a concern.

## 2022-11-28 NOTE — Telephone Encounter (Signed)
Pt is calling. Stated she had a minor seizure. Stated she don't know what happen. Pt said she was working out and she fainted. Pt said she think her medication needs to be adjusted.

## 2022-11-28 NOTE — Patient Instructions (Addendum)
I recommend increase your sodium intake, especially before working out. Be sure you are drinking enough, as well, to help keep your blood pressure up to prevent from drops causing syncope.   I have sent in the referral to rheumatology.   I have sent in the referral to GI.

## 2022-11-29 ENCOUNTER — Encounter: Payer: Self-pay | Admitting: Neurology

## 2022-12-07 DIAGNOSIS — R55 Syncope and collapse: Secondary | ICD-10-CM | POA: Insufficient documentation

## 2022-12-07 NOTE — Assessment & Plan Note (Signed)
Historic. Currently on prilosec. Symptoms of GERD continue. GI referral placed.

## 2022-12-07 NOTE — Assessment & Plan Note (Signed)
Recent syncopal episode during weight training with complete LOC and incontinence. No injury. Complete work-up negative. Symptoms consistent with vasovagal syncopal episode. Recommend that she avoid straining to that point in the future. Encouraged her to increase water and sodium intake, particularly during exercise. Compression stockings may also be beneficial. F/U with new or worsening symptoms.

## 2022-12-07 NOTE — Assessment & Plan Note (Signed)
Chronic. Managed with levothyroxine. No alarm symptoms present. No refills needed.

## 2022-12-07 NOTE — Assessment & Plan Note (Signed)
Seeing integrative medicine for management of this. Improved symptoms.

## 2022-12-07 NOTE — Assessment & Plan Note (Signed)
Conservative management for at least 4 weeks with no improvement of symptoms. Chronic in nature. Will send referral for GI for further evaluation and management. May consider prescription options, such as Linzess/Amtiza/Trulance once underlying etiology is ruled out.

## 2022-12-12 ENCOUNTER — Encounter: Payer: Self-pay | Admitting: Physician Assistant

## 2022-12-12 ENCOUNTER — Telehealth: Payer: Self-pay

## 2022-12-12 NOTE — Telephone Encounter (Signed)
A repersensative from Dr. Ulyses Amor office called stated they could not see the pt. Because she is already est. With  GI and she would have to see them.

## 2022-12-13 ENCOUNTER — Ambulatory Visit: Payer: BC Managed Care – PPO | Admitting: Podiatry

## 2022-12-15 ENCOUNTER — Encounter: Payer: Self-pay | Admitting: Registered"

## 2022-12-15 ENCOUNTER — Encounter: Payer: BC Managed Care – PPO | Attending: Internal Medicine | Admitting: Registered"

## 2022-12-15 DIAGNOSIS — R7303 Prediabetes: Secondary | ICD-10-CM | POA: Insufficient documentation

## 2022-12-15 NOTE — Progress Notes (Signed)
On 12/15/22 pt completed a post core session of the Diabetes Prevention Program course virtually with [facility]. By the end of this session patients are able to complete the following objectives:   Virtual Visit via Video Note  I connected with Kim Kim by a video enabled application and verified that I am speaking with the correct person using two identifiers.  Location: Patient: Home.  Provider: Office.   Learning Objectives: Reflect on lifestyle changes they have made since starting the DPP.  Set long-term goals to promote continued maintenance of lifestyle changes made during the program.   Goals:  Work toward reaching new long-term goals set during class.   Follow-Up Plan: Contact [facility] with questions/concerns PRN.

## 2022-12-18 ENCOUNTER — Telehealth: Payer: Self-pay | Admitting: Neurology

## 2022-12-18 NOTE — Telephone Encounter (Signed)
Completed brand name Lamictal PA on cmm/[employer] [id number] Will await determination.

## 2022-12-19 ENCOUNTER — Telehealth (INDEPENDENT_AMBULATORY_CARE_PROVIDER_SITE_OTHER): Payer: BC Managed Care – PPO | Admitting: Diagnostic Neuroimaging

## 2022-12-19 ENCOUNTER — Encounter: Payer: Self-pay | Admitting: Diagnostic Neuroimaging

## 2022-12-19 DIAGNOSIS — G40109 Localization-related (focal) (partial) symptomatic epilepsy and epileptic syndromes with simple partial seizures, not intractable, without status epilepticus: Secondary | ICD-10-CM

## 2022-12-19 DIAGNOSIS — R4689 Other symptoms and signs involving appearance and behavior: Secondary | ICD-10-CM

## 2022-12-19 MED ORDER — LAMICTAL 100 MG PO TABS: 100.0000 mg | ORAL_TABLET | Freq: Two times a day (BID) | ORAL | 4 refills | Status: DC

## 2022-12-19 NOTE — Progress Notes (Signed)
[facility]  PATIENT: Kim Kim DOB: [birth year]  REFERRING CLINICIAN: Early, Coralee Pesa, NP HISTORY FROM: patient  REASON FOR VISIT: follow up   HISTORICAL  CHIEF COMPLAINT:  Chief Complaint  Patient presents with   Dizziness    HISTORY OF PRESENT ILLNESS:   UPDATE (12/19/22, VRP): Since last visit, doing well until Nov-Dec 2023 had 3 episodes of lightheadedness, near syncope with working out.   11/07/22 --> driving and felt poorly; dizzy; symptoms lasted for few hours  11/14/22 --> working out, felt poorly and passed out. Went to ER. Then saw Dr Einar Gip for eval, dx with vasovagal syncope.   12/18/22 --> went to exercise class, then felt poorly after 15 minutes and had to leave.   No major seizures events. Tolerating lamotrigine.    UPDATE (10/16/22, VRP): Since last visit, doing well. Symptoms are stable. Tolerating meds.   UPDATE (01/04/21, VRP): Since last visit, doing well. Symptoms are improved. No alleviating or aggravating factors. Tolerating meds (lamictal 164m twice a day).  UPDATE (12/30/19, VRP): Since last visit, doing well. Some reports of memory loss. Some stress. Also asking about intermittent eye pressure issues when laying face down with massages. No alleviating or aggravating factors. Tolerating lamictal 1053mtwice a day. Mild spells have resolved.    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  In patient's [id number] was having recurrent dream, feeling like she was a little girl a swing in an old house.  Patient was having intermittent episodes of this strange feeling in her head which she had a hard time describing.  Sometimes she would feel lightheadedness and faint.  In her [age]she had an episode at home where she almost passed out.  In 67  she had an episode where apparently she turned pale in color and almost fainted.  No dj vu sensations.  She had 1 out of body sensation in high school.  In 2013  patient went to see a neurologist, had EEG which showed some left temporal lobe dysfunction but no clear epileptifor   Patient did well for many years.  She had gone from having 2-3 episodes a month down to no episodes.  [place] was under increased stress during Covid pandemic and relocation.  In the last few months she has noticed some mild strain sensations in her head which remind her of her prior spells.  She feels a lightheadedness and strangeness in her head.  No convulsions or loss of consciousness.  Patient has been stable on lamotrigine 75 in the morning and 100 at night.  Now patient having approximately 2-3 episodes per week.  Episodes can last a few minutes at a time.  No prior head trauma, meningitis or encephalitis.  No family history of seizures.   REVIEW OF SYSTEMS: Full 14 system review of systems performed and negative with exception of: As per HPI.  ALLERGIES: No Known Allergies  HOME MEDICATIONS: Outpatient Medications Prior to Visit  Medication Sig Dispense Refill   desonide (DESOWEN) 0.05 % cream Apply 1 Application topically as needed. (Patient not taking: Reported on 11/28/2022) 60 g 3   docusate sodium (COLACE) 100 MG capsule Take 100 mg by mouth every other day.     Ferrous Sulfate Dried (SLOW RELEASE IRON) 45 MG TBCR Take 1 tablet by mouth daily at 6 (six) AM. 90 tablet 1   ketoconazole (NIZORAL) 2 % cream Apply 1 Application topically  2 (two) times daily. To affected areas. (Patient not taking: Reported on 11/28/2022) 60 g 3   LAMICTAL 100 MG tablet Take 1 tablet (100 mg total) by mouth 2 (two) times daily. 180 tablet 4   omeprazole (PRILOSEC) 20 MG capsule Take 20 mg by mouth daily.     rosuvastatin (CRESTOR) 10 MG tablet Take 10 mg by mouth at bedtime.     SYNTHROID 50 MCG tablet Take 50 mcg by mouth daily before breakfast. Taking 6 days a week     SYNTHROID 75 MCG tablet Take 75 mcg by mouth daily before breakfast.  Taking 1 day a week-Sunday     No facility-administered medications prior to visit.    PAST MEDICAL HISTORY: Past Medical History:  Diagnosis Date   Allergy    seasonal allergies   Anemia    hx of   Carotid atherosclerosis    Epilepsy ([place])    Gastric ulcer    hx of   GERD (gastroesophageal reflux disease)    on meds   Growth hormone deficiency ([place])    Hiatal hernia    Hypothyroidism    hx of Hashimoto thyroiditis   Pituitary adenoma ([place])    hx of   Seizures ([place])    temporal lobe   Thyroid disease    on meds   Vitamin D deficiency     PAST SURGICAL HISTORY: Past Surgical History:  Procedure Laterality Date   COLONOSCOPY  12/10/2015   [place] Dr Meredith Leeds   ESOPHAGOGASTRODUODENOSCOPY  06/03/2019   Dr Celso Sickle, [place]   WISDOM TOOTH EXTRACTION      FAMILY HISTORY: Family History  Problem Relation Age of Onset   Hypertension Mother    Heart disease Mother    Diabetes Mother    Thyroid disease Mother        hypo   Thyroid nodules Mother    Hyperlipidemia Father    Heart disease Father    Hypertension Father    Hypertension Sister    Hearing loss Sister    Hypertension Sister    Hearing loss Sister    Diabetes Sister    Hypertension Sister    Hearing loss Sister    Heart disease Brother    Esophageal cancer Neg Hx    Colon cancer Neg Hx    Colon polyps Neg Hx    Stomach cancer Neg Hx    Rectal cancer Neg Hx     SOCIAL HISTORY: Social History   Socioeconomic History   Marital status: Married    Spouse name: Gene   Number of children: 3   Years of education: Not on file   Highest education level: Associate degree: occupational, Hotel manager, or vocational program  Occupational History    Comment: NA  Tobacco Use   Smoking status: Never   Smokeless tobacco: Never  Vaping Use   Vaping Use: Never used  Substance and Sexual Activity   Alcohol use: Not Currently    Comment: socially   Drug use: Never   Sexual activity: Yes     Partners: Male    Birth control/protection: Post-menopausal  Other Topics Concern   Not on file  Social History Narrative   She recently moved from [place]. 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   Caffeine- coffee maybe 1 c, some tea   Social Determinants of Health   Financial Resource Strain: Not on file  Food Insecurity: Not on file  Transportation Needs: Not  on file  Physical Activity: Not on file  Stress: Not on file  Social Connections: Not on file  Intimate Partner Violence: Not on file     PHYSICAL EXAM  Video visit    DIAGNOSTIC DATA (LABS, IMAGING, TESTING) - I reviewed patient records, labs, notes, testing and imaging myself where available.  Lab Results  Component Value Date   WBC 7.3 11/14/2022   HGB 11.8 (L) 11/14/2022   HCT 36.5 11/14/2022   MCV 83.9 11/14/2022   PLT 195 11/14/2022      Component Value Date/Time   NA 138 11/14/2022 1207   NA 139 07/14/2021 0000   K 4.6 11/14/2022 1207   CL 104 11/14/2022 1207   CO2 26 11/14/2022 1207   GLUCOSE 96 11/14/2022 1207   BUN 18 11/14/2022 1207   BUN 17 07/14/2021 0000   CREATININE 0.98 11/14/2022 1207   CALCIUM 9.6 11/14/2022 1207   PROT 7.8 11/16/2022 1636   PROT 7.9 07/12/2021 0948   ALBUMIN 4.4 11/16/2022 1636   ALBUMIN 5.1 (H) 07/12/2021 0948   AST 18 11/16/2022 1636   ALT 11 11/16/2022 1636   ALKPHOS 95 11/16/2022 1636   BILITOT 0.3 11/16/2022 1636   BILITOT 0.4 07/12/2021 0948   GFRNONAA >60 11/14/2022 1207   Lab Results  Component Value Date   CHOL 118 11/16/2022   HDL 50.10 11/16/2022   LDLCALC 46 11/16/2022   TRIG 113.0 11/16/2022   CHOLHDL 2 11/16/2022   Lab Results  Component Value Date   HGBA1C 6.0 11/16/2022   Lab Results  Component Value Date   VITAMINB12 >1500 (H) 11/16/2022   Lab Results  Component Value Date   TSH 2.32 11/28/2021    2013 MRI brain  -Unremarkable brain -Small 1 mm focus of hypoenhancement may represent  microadenoma  2013 EEG -Left anterior temporal lobe dysfunction; no epileptiform discharges  2013 Long-term EEG -7-hour video EEG; low voltage occipital spikes possibly epileptiform -No clinical or electrographic seizures   ASSESSMENT AND PLAN  67 y.o. year old female here with intermittent spells of lightheadedness, strange feeling in head, diagnosed with possible temporal lobe seizures in 2013.   Dx:  1. 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 Spell of abnormal behavior     PLAN:  EXERCISE INTOLERANCE (lightheaded, dizziness; with moderate exercise) - follow up with cardiology  TEMPORAL LOBE SEIZURES / SPELLS (last minor spells in ~2020, but not clear cut) - continue lamictal 126m twice a day (brand medically necessary) - may need to consider video EEG testing in future  MEMORY LOSS (mild subjective; no change in ADLs) - improve exercise, nutrition, sleep, stress mgmt  Meds ordered this encounter  Medications   LAMICTAL 100 MG tablet    Sig: Take 1 tablet (100 mg total) by mouth 2 (two) times daily.    Dispense:  180 tablet    Refill:  4   Return in about 1 year (around 12/20/2023).  Virtual Visit via Video Note  I connected with DYehuda Maoon 12/19/22 at  2:45 PM EST by a video enabled telemedicine application and verified that I am speaking with the correct person using two identifiers.   I discussed the limitations of evaluation and management by telemedicine and the availability of in person appointments. The patient expressed understanding and agreed to proceed.  Patient is at home and I am at the office.   I spent 50 minutes of face-to-face and non-face-to-face time with patient.  This included previsit chart review,  lab review, study review, order entry, electronic health record documentation, patient education.      Penni Bombard, MD [id number], [id number] PM Certified in Neurology, Neurophysiology and Neuroimaging  [facility] [address], [place], [place] [address] [phone]

## 2022-12-19 NOTE — Telephone Encounter (Signed)
PA approved for the patient 12/18/2022 through 12/17/2023.

## 2022-12-20 NOTE — Progress Notes (Unsigned)
Primary Physician/Referring:  Orma Render, NP  Patient ID: Kim Kim, female    DOB: 1956-07-09, 67 y.o.   MRN: [id number]  No chief complaint on file.  HPI:    Kim Kim  is a 67 y.o. Caucasian female with a history of GERD, B12 deficiency, hiatal hernia, PUD with gastric ulcer in 2020 due to NSAID, history of pituitary adenoma, seizure disorder, history of Hashimoto's thyroiditis with subsequent hypothyroidism, hyperlipidemia, diabetes mellitus, family history of premature coronary artery disease.  Patient has had chronic exertional chest pain suggestive of angina but has had extensive cardiac workup in the past and has been negative, coronary CTA normal, coronary calcium score 0 and cardiopulmonary stress test except for exercise-induced LBBB and chest pain, had good exercise tolerance. GI workup has been negative.  I last seen her in 2022, she made an appointment to see me in view of recent onset of near syncope and was seen in the emergency room 2 days ago on 11/14/2022. Patient was exercising in the gym and felt very dizzy. This happened to her multiple times especially when the trainer was trying to push her to do more activity.  Eventually she had an episode of syncope and loss of bladder control.  Presently she is asymptomatic, states that she is still recuperating from bronchitis that she had in the last week of December.  Husband is present at the bedside.  Past Medical History:  Diagnosis Date   Allergy    seasonal allergies   Anemia    hx of   Carotid atherosclerosis    Epilepsy ([place])    Gastric ulcer    hx of   GERD (gastroesophageal reflux disease)    on meds   Growth hormone deficiency ([place])    Hiatal hernia    Hypothyroidism    hx of Hashimoto thyroiditis   Pituitary adenoma ([place])    hx of   Seizures ([place])    temporal lobe   Thyroid disease    on meds   Vitamin D deficiency    Past Surgical History:  Procedure Laterality Date   COLONOSCOPY  12/10/2015    [place] Dr Meredith Leeds   ESOPHAGOGASTRODUODENOSCOPY  06/03/2019   Dr Celso Sick[place], [place]   WISDOM TOOTH EXTRACTION       Social History   Tobacco Use   Smoking status: Never   Smokeless tobacco: Never  Substance Use Topics   Alcohol use: Not Currently    Comment: socially   Marital Status: Married  ROS  Review of Systems  Cardiovascular:  Positive for near-syncope. Negative for chest pain, dyspnea on exertion and leg swelling.  Gastrointestinal:  Positive for heartburn. Negative for melena.   Objective  There were no vitals taken for this visit.     11/28/2022    2:29 PM 11/24/2022   11:12 AM 11/16/2022    3:58 PM  Vitals with BMI  Height 5' 4"$  5' 4"$  5' 4"$   Weight 174 lbs 169 lbs 170 lbs  BMI 29.85 [id number]  Systolic [id number]  Diastolic 68 70 68  Pulse 76 75 70     Physical Exam Neck:     Vascular: No carotid bruit or JVD.  Cardiovascular:     Rate and Rhythm: Normal rate and regular rhythm.     Pulses: Intact distal pulses.     Heart sounds: Normal heart sounds. No murmur heard.    No gallop.  Pulmonary:     Effort: Pulmonary effort is  normal.     Breath sounds: Normal breath sounds.  Abdominal:     General: Bowel sounds are normal.     Palpations: Abdomen is soft.  Musculoskeletal:     Right lower leg: No edema.     Left lower leg: No edema.    Laboratory examination:   Recent Labs    11/14/22 1207  NA 138  K 4.6  CL 104  CO2 26  GLUCOSE 96  BUN 18  CREATININE 0.98  CALCIUM 9.6  GFRNONAA >60       Latest Ref Rng & Units 11/16/2022    4:36 PM 11/14/2022   12:07 PM 07/14/2021   12:00 AM  CMP  Glucose 70 - 99 mg/dL  96    BUN 8 - 23 mg/dL  18  17      Creatinine 0.44 - 1.00 mg/dL  0.98  0.9      Sodium 135 - 145 mmol/L  138  139      Potassium 3.5 - 5.1 mmol/L  4.6  4.2      Chloride 98 - 111 mmol/L  104  104      CO2 22 - 32 mmol/L  26  26      Calcium 8.9 - 10.3 mg/dL  9.6    Total Protein 6.0 - 8.3 g/dL 7.8     Total  Bilirubin 0.2 - 1.2 mg/dL 0.3     Alkaline Phos 39 - 117 U/L 95   112      AST 0 - 37 U/L 18   25      ALT 0 - 35 U/L 11   17         This result is from an external source.       Latest Ref Rng & Units 11/14/2022   12:07 PM 09/29/2020    1:36 PM 01/07/2020   11:19 AM  CBC  WBC 4.0 - 10.5 K/uL 7.3  7.1  7.0   Hemoglobin 12.0 - 15.0 g/dL 11.8  12.0  12.5   Hematocrit 36.0 - 46.0 % 36.5  36.1  37.6   Platelets 150 - 400 K/uL 195  242.0  250.0    Lab Results  Component Value Date   CHOL 118 11/16/2022   HDL 50.10 11/16/2022   LDLCALC 46 11/16/2022   TRIG 113.0 11/16/2022   CHOLHDL 2 11/16/2022     HEMOGLOBIN A1C Lab Results  Component Value Date   HGBA1C 6.0 11/16/2022   TSH No results for input(s): "TSH" in the last 8760 hours.   External labs:   Labs 04/27/2022:  Vitamin D 62, TSH normal at 1.94.  A1c 5.7%.  Total cholesterol 127, triglycerides 84, HDL 54, LDL 56, non-HDL cholesterol 73.   Medications and allergies  No Known Allergies   Current Outpatient Medications:    desonide (DESOWEN) 0.05 % cream, Apply 1 Application topically as needed. (Patient not taking: Reported on 11/28/2022), Disp: 60 g, Rfl: 3   docusate sodium (COLACE) 100 MG capsule, Take 100 mg by mouth every other day., Disp: , Rfl:    Ferrous Sulfate Dried (SLOW RELEASE IRON) 45 MG TBCR, Take 1 tablet by mouth daily at 6 (six) AM., Disp: 90 tablet, Rfl: 1   ketoconazole (NIZORAL) 2 % cream, Apply 1 Application topically 2 (two) times daily. To affected areas. (Patient not taking: Reported on 11/28/2022), Disp: 60 g, Rfl: 3   LAMICTAL 100 MG tablet, Take 1 tablet (100 mg total) by mouth  2 (two) times daily., Disp: 180 tablet, Rfl: 4   omeprazole (PRILOSEC) 20 MG capsule, Take 20 mg by mouth daily., Disp: , Rfl:    rosuvastatin (CRESTOR) 10 MG tablet, Take 10 mg by mouth at bedtime., Disp: , Rfl:    SYNTHROID 50 MCG tablet, Take 50 mcg by mouth daily before breakfast. Taking 6 days a week, Disp: ,  Rfl:    SYNTHROID 75 MCG tablet, Take 75 mcg by mouth daily before breakfast. Taking 1 day a week-Sunday, Disp: , Rfl:    Radiology:   Chest x-ray PA and lateral view 12/10/2020: Heart and mediastinal contours are within normal limits.  Both lungs are clear.  Cardiac Studies:   Calcium score 11/13/17: Total score 0.  Carotid duplex, 01/08/18: Minimal atherosclerotic plaque in the carotid artery bifurcations bilaterally  Stress echo, 05/01/18: She completed 7 minutes, 45 seconds, 8.4 METs, no ischemia.  Coronary CT angiogram 12/23/2018: Normal coronary arteries. Right dominant circulation. Coronary calcium score not done. (Calcium score of 0 on 11/13/2017).  Echocardiogram 02/12/2019:    1. The left ventricle has normal systolic function, with an ejection fraction of 55-60%. The cavity size was normal. Left ventricular diastolic Doppler parameters are consistent with impaired relaxation. No evidence of left ventricular regional wall  motion abnormalities.  2. The right ventricle has normal systolic function. The cavity was normal. There is no increase in right ventricular wall thickness.  3. The aortic valve is tricuspid.  4. The aortic root is normal in size and structure.  Cardiopulmonary stress test 01/13/2021: Exercise testing with gas exchange indicates a normal functional capacity when compared to matched sedentary norms. There is no clear cardiopulmonary limitation. VE/VCO2 slope is slightly elevated and could indicate increased pulmonary pressures during exercise. Note exercise induced LBBB. Normal BP response.    Exercise Time:    11:45   Speed (mph): 3.0       Grade (%): 15.0    RPE: 17  Reason stopped: Chest pain (9/10).  Additional symptoms: Dyspnea (3/10)   EKG:   EKG 11/16/2022: Normal sinus rhythm at a rate of 70 bpm, incomplete right bundle branch block.  Normal EKG.  No change from 07/12/2021.  Assessment     ICD-10-CM   1. Vasovagal syncope  R55     2. Prediabetes   R73.03     3. 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   No orders of the defined types were placed in this encounter.  No orders of the defined types were placed in this encounter.  Recommendations:   Kim Kim is a 67 y.o. Caucasian female with a history of GERD, B12 deficiency, hiatal hernia, PUD with gastric ulcer in 2020 due to NSAID, history of pituitary adenoma, seizure disorder, history of Hashimoto's thyroiditis with subsequent hypothyroidism, hyperlipidemia, prediabetes mellitus, family history of premature coronary artery disease.  Patient has had chronic exertional chest pain suggestive of angina but has had extensive cardiac workup in the past and has been negative, coronary CTA normal, coronary calcium score 0 and cardiopulmonary stress test except for exercise-induced LBBB and chest pain, had good exercise tolerance. GI workup has been negative.  I last seen her in 2022, she made an appointment to see me in view of recent onset of near syncope and was seen in the emergency room 2 days ago on 11/14/2022. Patient was exercising in the gym and felt very dizzy. This happened to her multiple  times especially when the trainer was trying to push her to do more activity.  Eventually she had an episode of syncope and loss of bladder control.  1. Vasovagal syncope Her symptoms of dizziness while doing heavy weightlifting while holding breath is very classic for vasovagal syncope.  I performed carotid artery massage and I could not elicit any symptoms.  She has normal physical exam.  She did have loss of bladder control after she had an episode of syncope, and although she has history of seizures, she was completely aware of her surroundings immediately and hence do not suspect seizure disorder.  ED records evaluated by me today and labs reviewed.  I have simply reassured them.  I also discussed with him regarding avoidance of dehydration, to  drink Pedialyte before every exercise, counterpressure maneuvers discussed.  2. Pure hypercholesterolemia Patient previously had mild hyperlipidemia and also elevated triglycerides, all this is normalized.  She is now on a statin.  She is to have microvascular angina pectoris which has completely resolved since being on a statin.  Continue the same.  3. Hyperglycemia She does have hyperglycemia but recent labs reviewed, with weight loss and diet, A1c is also improved.  Husband is present, all questions answered.  This was a 40-minute office visit encounter.  I spent 30 minutes face-to-face and I spent 10 minutes to review her records.    Adrian Prows, MD, [facility] 12/20/2022, 9:58 PM Office: [phone]

## 2022-12-21 ENCOUNTER — Ambulatory Visit: Payer: BC Managed Care – PPO | Admitting: Cardiology

## 2022-12-21 ENCOUNTER — Encounter: Payer: Self-pay | Admitting: Cardiology

## 2022-12-21 VITALS — BP 114/74 | HR 78 | Ht 64.0 in | Wt 168.0 lb

## 2022-12-21 DIAGNOSIS — G40109 Localization-related (focal) (partial) symptomatic epilepsy and epileptic syndromes with simple partial seizures, not intractable, without status epilepticus: Secondary | ICD-10-CM

## 2022-12-21 DIAGNOSIS — R55 Syncope and collapse: Secondary | ICD-10-CM

## 2022-12-21 DIAGNOSIS — R7303 Prediabetes: Secondary | ICD-10-CM

## 2022-12-21 NOTE — Progress Notes (Signed)
Office Visit Note  Patient: Kim Kim             Date of Birth: 30-Mar-1956           MRN: [id number]             PCP: Orma Render, NP Referring: Janith Lima, MD Visit Date: 01/04/2023 Occupation: '@GUAROCC'$ @  Subjective:  Discuss DEXA results   History of Present Illness: Kim Kim is a 67 y.o. female with history of osteopenia.  Patient presents today to discuss previous DEXA results.  Her baseline DEXA was ordered on 10/02/2007.  Her most recent DEXA was performed on 04/27/2022.  Kim Kim has been taking a vitamin D and vitamin K supplement.  Kim Kim has been obtaining calcium through her diet.  Kim Kim eats cottage cheese most days.  Kim Kim denies any history of vertebral or stress fractures.  Kim Kim denies any recent falls.  Kim Kim has remained active over the years exercising and working with a Physiological scientist.   Kim Kim is not currently interested in taking any medications for management of osteoporosis at this time.  Of note Kim Kim does have a history of GERD and is currently taking Prilosec.      Activities of Daily Living:  Patient reports morning stiffness for 0 minutes.   Patient Denies nocturnal pain.  Difficulty dressing/grooming: Denies Difficulty climbing stairs: Denies Difficulty getting out of chair: Denies Difficulty using hands for taps, buttons, cutlery, and/or writing: Denies  Review of Systems  Constitutional: Negative.  Negative for fatigue.  HENT: Negative.  Negative for mouth sores and mouth dryness.   Eyes:  Positive for dryness.  Respiratory: Negative.  Negative for shortness of breath.   Cardiovascular: Negative.  Negative for chest pain and palpitations.  Gastrointestinal:  Positive for constipation. Negative for blood in stool and diarrhea.  Endocrine: Negative.  Negative for increased urination.  Genitourinary: Negative.  Negative for involuntary urination.  Musculoskeletal:  Positive for joint pain, joint pain and joint swelling. Negative for gait problem, myalgias,  muscle weakness, morning stiffness, muscle tenderness and myalgias.  Skin: Negative.  Negative for color change, rash, hair loss and sensitivity to sunlight.  Allergic/Immunologic: Negative.  Negative for susceptible to infections.  Neurological: Negative.  Negative for dizziness and headaches.  Hematological: Negative.  Negative for swollen glands.  Psychiatric/Behavioral: Negative.  Negative for depressed mood and sleep disturbance. The patient is not nervous/anxious.     PMFS History:  Patient Active Problem List   Diagnosis Date Noted   Other fatigue 01/03/2023   BMI 29.0-29.9,adult 01/03/2023   Syncope and collapse 12/07/2022   Intrinsic eczema 11/16/2022   Bacterial overgrowth syndrome 07/19/2021   Prediabetes 01/22/2020   Osteopenia 01/22/2020   Chronic idiopathic constipation 09/01/2019   Osteoarthritis 07/17/2019   Gastric ulcer 06/13/2019   Family history of heart disease 12/11/2018   Environmental and seasonal allergies 10/17/2018   B12 deficiency 10/16/2017   Osteoporosis 10/01/2017   Uterine fibroid 07/31/2017   Velopharyngeal insufficiency (VPI), 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   Screen for colon cancer 07/31/2016   Iron deficiency 04/27/2016   Acute sinusitis 12/01/2015   Deviated nasal septum 12/01/2015   Candidiasis of mouth 10/13/2015   Infective otitis externa 04/22/2014   Hypothyroidism 12/17/2012   Insulin resistance 12/17/2012   Auras 10/15/2012   Hashimoto's thyroiditis 07/24/2012   Pituitary cyst ([place]) 07/24/2012   Basal metabolism function study abnormality 03/07/2012   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   Pulmonary nodules 07/09/2011   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  Dysmetabolic syndrome X [id number]   Growth hormone deficiency ([place]) 03/28/2011   Low HDL (under 40) 03/28/2011   GERD (gastroesophageal reflux disease) 01/01/2009   Generalized obesity 05/19/2008   Vitamin D deficiency 10/07/2007    Past Medical History:  Diagnosis  Date   Allergy    seasonal allergies   Anemia    hx of   Carotid atherosclerosis    Epilepsy ([place])    Gastric ulcer    hx of   GERD (gastroesophageal reflux disease)    on meds   Growth hormone deficiency ([place])    Hiatal hernia    Hypothyroidism    hx of Hashimoto thyroiditis   Pituitary adenoma ([place])    hx of   Seizures ([place])    temporal lobe   Thyroid disease    on meds   Vitamin D deficiency     Family History  Problem Relation Age of Onset   Hypertension Mother    Heart disease Mother    Diabetes Mother    Thyroid disease Mother        hypo   Thyroid nodules Mother    Hyperlipidemia Father    Heart disease Father    Hypertension Father    Hypertension Sister    Hearing loss Sister    Hypertension Sister    Hearing loss Sister    Diabetes Sister    Hypertension Sister    Hearing loss Sister    Heart disease Brother    Esophageal cancer Neg Hx    Colon cancer Neg Hx    Colon polyps Neg Hx    Stomach cancer Neg Hx    Rectal cancer Neg Hx    Past Surgical History:  Procedure Laterality Date   COLONOSCOPY  12/10/2015   [place] Dr Meredith Leeds   ESOPHAGOGASTRODUODENOSCOPY  06/03/2019   Dr Celso Sickle, [place] EXTRACTION     Social History   Social History Narrative   Kim Kim recently moved from [place]. Pt is a former Garment/textile technologist. Kim Kim is married with 3 grown children.   Caffeine- coffee maybe 1 c, some tea   Immunization History  Administered Date(s) Administered   DTaP 10/06/2009   H1N1 11/16/2008   Hepatitis A, Adult 12/05/2016, 08/09/2017   Influenza Inj Mdck Quad Pf 08/13/2017   Influenza Split 08/31/2011, 08/22/2012   Influenza, Quadrivalent, Recombinant, Inj, Pf 08/01/2018   Influenza, Seasonal, Injecte, Preservative Fre 09/04/2013   Influenza,inj,Quad PF,6+ Mos 06/26/2019, 08/01/2022   Influenza,inj,quad, With Preservative 08/10/2014, 07/13/2015, 07/11/2016, 08/07/2017   Influenza-Unspecified 10/09/2008, 08/15/2010, 08/10/2014,  07/13/2015, 07/11/2016, 08/05/2020, 08/24/2021   PFIZER Comirnaty(Gray Top)Covid-19 Tri-Sucrose Vaccine 08/14/2022   PFIZER(Purple Top)SARS-COV-2 Vaccination 01/03/2020, 01/24/2020, 08/14/2020   PPD Test 05/13/2014, 05/20/2014, 06/28/2015, 07/13/2015, 07/11/2016, 08/07/2017   Pfizer Covid-19 Vaccine Bivalent Booster 44yr & up 08/01/2021   Tdap 10/06/2009   Zoster Recombinat (Shingrix) 08/13/2017, 06/09/2018     Objective: Vital Signs: BP 103/70 (BP Location: Right Arm, Patient Position: Standing, Cuff Size: Normal)   Pulse 73   Resp 14   Ht '5\' 3"'$  (1.6 m) Comment: Patient reported  Wt 172 lb 6.4 oz (78.2 kg)   BMI 30.54 kg/m    Physical Exam Vitals and nursing note reviewed.  Constitutional:      Appearance: Kim Kim is well-developed.  HENT:     Head: Normocephalic and atraumatic.  Eyes:     Conjunctiva/sclera: Conjunctivae normal.  Cardiovascular:     Rate and Rhythm: Normal rate and regular rhythm.  Heart sounds: Normal heart sounds.  Pulmonary:     Effort: Pulmonary effort is normal.     Breath sounds: Normal breath sounds.  Abdominal:     General: Bowel sounds are normal.     Palpations: Abdomen is soft.  Musculoskeletal:     Cervical back: Normal range of motion.  Skin:    General: Skin is warm and dry.     Capillary Refill: Capillary refill takes less than 2 seconds.  Neurological:     Mental Status: Kim Kim is alert and oriented to person, place, and time.  Psychiatric:        Behavior: Behavior normal.      Musculoskeletal Exam: C-spine, thoracic spine, and lumbar spine good ROM.  No midline spinal tenderness.  No SI joint tenderness.  Shoulder joints good ROM with no discomfort.  Hypertension of both elbows. wrist joints, MCPs, PIPs, and DIPs good ROM with no synovitis.  Complete fist formation bilaterally.  Thickening of right 2nd and 3rd PIP joints.  DIP prominence consistent with OA of both hands.  Hip joints, knee joints, and anlke joints have good ROM with no  discomfort.  No warmth or effusion of knee joints.  No tenderness or swelling of ankle joints.  PIP and DIP thickening consistent with osteoarthritis of both feet.  No evidence of achilles tendonitis or plantar fasciitis.   CDAI Exam: CDAI Score: -- Patient Global: --; Provider Global: -- Swollen: --; Tender: -- Joint Exam 01/04/2023   No joint exam has been documented for this visit   There is currently no information documented on the homunculus. Go to the Rheumatology activity and complete the homunculus joint exam.  Investigation: No additional findings.  Imaging: ECHOCARDIOGRAM COMPLETE  Result Date: 01/03/2023    ECHOCARDIOGRAM REPORT   Patient Name:   ATAYA CACAL Date of Exam: 01/02/2023 Medical Rec #:  [id number]    Height:       64.0 in Accession #:    [id number]   Weight:       168.0 lb Date of Birth:  04/04/1956    BSA:          1.817 m Patient Age:    23 years     BP:           114`/74 mmHg Patient Gender: F            HR:           67 bpm. Exam Location:  Outpatient Procedure: 2D Echo, Cardiac Doppler and Color Doppler Indications:     Syncope R55  History:         Patient has prior history of Echocardiogram examinations, most                  recent 02/12/2019. Signs/Symptoms:Syncope; Risk Factors:GERD.  Sonographer:     Bernadene Person RDCS Referring Phys:  [place] Diagnosing Phys: Adrian Prows MD IMPRESSIONS  1. Left ventricular ejection fraction, by estimation, is 60 to 65%. The left ventricle has normal function. The left ventricle has no regional wall motion abnormalities. Left ventricular diastolic parameters were normal.  2. Right ventricular systolic function is normal. The right ventricular size is normal. There is normal pulmonary artery systolic pressure. The estimated right ventricular systolic pressure is 99991111 mmHg.  3. The mitral valve is normal in structure. Trivial mitral valve regurgitation. No evidence of mitral stenosis.  4. The aortic valve is normal in structure.  Aortic valve regurgitation is not visualized. No aortic  stenosis is present.  5. The inferior vena cava is normal in size with greater than 50% respiratory variability, suggesting right atrial pressure of 3 mmHg. FINDINGS  Left Ventricle: Left ventricular ejection fraction, by estimation, is 60 to 65%. The left ventricle has normal function. The left ventricle has no regional wall motion abnormalities. The left ventricular internal cavity size was normal in size. There is  no left ventricular hypertrophy. Left ventricular diastolic parameters were normal. Right Ventricle: The right ventricular size is normal. No increase in right ventricular wall thickness. Right ventricular systolic function is normal. There is normal pulmonary artery systolic pressure. The tricuspid regurgitant velocity is 1.73 m/s, and  with an assumed right atrial pressure of 3 mmHg, the estimated right ventricular systolic pressure is 99991111 mmHg. Left Atrium: Left atrial size was normal in size. Right Atrium: Right atrial size was normal in size. Pericardium: There is no evidence of pericardial effusion. Mitral Valve: The mitral valve is normal in structure. Trivial mitral valve regurgitation. No evidence of mitral valve stenosis. Tricuspid Valve: The tricuspid valve is normal in structure. Tricuspid valve regurgitation is mild . No evidence of tricuspid stenosis. Aortic Valve: The aortic valve is normal in structure. Aortic valve regurgitation is not visualized. No aortic stenosis is present. Pulmonic Valve: The pulmonic valve was normal in structure. Pulmonic valve regurgitation is not visualized. No evidence of pulmonic stenosis. Aorta: The aortic root is normal in size and structure. Venous: The inferior vena cava is normal in size with greater than 50% respiratory variability, suggesting right atrial pressure of 3 mmHg. IAS/Shunts: No atrial level shunt detected by color flow Doppler.  LEFT VENTRICLE PLAX 2D LVIDd:         3.90 cm      Diastology LVIDs:         2.90 cm     LV e' medial:    6.00 cm/s LV PW:         0.70 cm     LV E/e' medial:  8.3 LV IVS:        0.70 cm     LV e' lateral:   10.90 cm/s LVOT diam:     1.90 cm     LV E/e' lateral: 4.6 LV SV:         46 LV SV Index:   25 LVOT Area:     2.84 cm  LV Volumes (MOD) LV vol d, MOD A2C: 81.0 ml LV vol d, MOD A4C: 78.1 ml LV vol s, MOD A2C: 32.4 ml LV vol s, MOD A4C: 38.6 ml LV SV MOD A2C:     48.6 ml LV SV MOD A4C:     78.1 ml LV SV MOD BP:      43.7 ml RIGHT VENTRICLE RV S prime:     8.27 cm/s TAPSE (M-mode): 1.6 cm LEFT ATRIUM             Index        RIGHT ATRIUM           Index LA diam:        3.40 cm 1.87 cm/m   RA Area:     12.80 cm LA Vol (A2C):   34.5 ml 18.99 ml/m  RA Volume:   28.00 ml  15.41 ml/m LA Vol (A4C):   27.6 ml 15.19 ml/m LA Biplane Vol: 31.9 ml 17.56 ml/m  AORTIC VALVE LVOT Vmax:   75.00 cm/s LVOT Vmean:  49.300 cm/s LVOT VTI:    0.161  m  AORTA Ao Root diam: 3.00 cm Ao Asc diam:  3.00 cm MITRAL VALVE               TRICUSPID VALVE MV Area (PHT): 3.48 cm    TR Peak grad:   12.0 mmHg MV Decel Time: 218 msec    TR Vmax:        173.00 cm/s MV E velocity: 50.00 cm/s MV A velocity: 48.90 cm/s  SHUNTS MV E/A ratio:  1.02        Systemic VTI:  0.16 m                            Systemic Diam: 1.90 cm Adrian Prows MD Electronically signed by Adrian Prows MD Signature Date/Time: 01/03/2023/5:44:55 AM    Final     Recent Labs: Lab Results  Component Value Date   WBC 7.3 11/14/2022   HGB 11.8 (L) 11/14/2022   PLT 195 11/14/2022   NA 138 11/14/2022   K 4.6 11/14/2022   CL 104 11/14/2022   CO2 26 11/14/2022   GLUCOSE 96 11/14/2022   BUN 18 11/14/2022   CREATININE 0.98 11/14/2022   BILITOT 0.3 11/16/2022   ALKPHOS 95 11/16/2022   AST 18 11/16/2022   ALT 11 11/16/2022   PROT 7.8 11/16/2022   ALBUMIN 4.4 11/16/2022   CALCIUM 9.6 11/14/2022    Speciality Comments: No specialty comments available.  Procedures:  No procedures performed Allergies: Patient has no  known allergies.    Assessment / Plan:     Visit Diagnoses: Osteopenia of multiple sites: Baseline DEXA 10/02/2007. Most recent DEXA updated on 04/27/2022: RFN BMD 0.707 with T-score -2.4.  1-13/8% change from baseline.  +1.0% change from previous scan on 09/05/19.  LFN BMD 0.712 T-score -2.3, change from baseline: -15/8% and change from previous -4.2%.  Reviewed results in detail with the patient today in the office.  Patient remains within the osteopenia range. No history of fragility fractures.  No vertebral fractures.  No recent falls.  Long standing history of GERD-taking Prilosec.  Naive to osteoporosis treatment.    Discussed that the mainstay treatment for osteopenia remains calcium and vitamin D supplementation.  I also discussed the importance of performing resistive and weightbearing exercises. Encouraged patient to continue strength training and weight bearing exercises.   Kim Kim is resistant to take any prescription medications for osteoporosis unless completely necessary in the future.    Discussed that ideally Kim Kim should be obtaining calcium 400 mg 3 times daily for best absorption.  Kim Kim was given a list of calcium rich foods and was encouraged to figure out on her average day how much calcium Kim Kim obtains through her diet and then supplement based on that to reach the goal of 1200 mg daily.  Patient has a history of vitamin D deficiency.  Kim Kim has used sublingual vitamin D supplementation in the past.  Discussed that vitamin D 2000 units daily should be an adequate maintenance dose.  Vitamin D was 62 on 04/27/2022.  Plan to recheck vitamin D at her follow up visit in 3 months.  Patient requested to follow up with Dr. Estanislado Pandy in 3 months to get her opinion in regards to the DEXA.   Due to update DEXA in June 2025.    Vitamin D deficiency: Vitamin D 62 on 04/27/2022.  Kim Kim has used sublingual vitamin D in the past.  Encourage patient to take vitamin D 2000 units daily.  Vitamin D will be rechecked at  her follow-up visit in 3 months.  A future order for vitamin D was placed today.  Primary osteoarthritis of right hand: Thickening over the right second and third PIP joints.  DIP prominence consistent with osteoarthritis of both hands noted.  No synovitis or tenderness over the MCP joints.    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     Follow-Up Instructions: Return in about 3 months (around 04/06/2023) for Osteoporosis, Osteoarthritis.   Ofilia Neas, PA-C  Note - This record has been created using Dragon software.  Chart creation errors have been sought, but may not always  have been located. Such creation errors do not reflect on  the standard of medical care.

## 2022-12-25 ENCOUNTER — Other Ambulatory Visit: Payer: BC Managed Care – PPO

## 2022-12-25 ENCOUNTER — Ambulatory Visit (HOSPITAL_COMMUNITY): Payer: BC Managed Care – PPO

## 2022-12-25 DIAGNOSIS — R55 Syncope and collapse: Secondary | ICD-10-CM

## 2022-12-25 DIAGNOSIS — G40109 Localization-related (focal) (partial) symptomatic epilepsy and epileptic syndromes with simple partial seizures, not intractable, without status epilepticus: Secondary | ICD-10-CM

## 2022-12-28 ENCOUNTER — Ambulatory Visit (INDEPENDENT_AMBULATORY_CARE_PROVIDER_SITE_OTHER): Payer: BC Managed Care – PPO | Admitting: Family Medicine

## 2023-01-02 ENCOUNTER — Ambulatory Visit (HOSPITAL_COMMUNITY)
Admission: RE | Admit: 2023-01-02 | Discharge: 2023-01-02 | Disposition: A | Discharge status: Home / Self Care | Payer: Medicare Other | Source: Ambulatory Visit | Attending: Cardiology | Admitting: Cardiology

## 2023-01-02 ENCOUNTER — Ambulatory Visit: Payer: Medicare Other

## 2023-01-02 ENCOUNTER — Encounter: Payer: Self-pay | Admitting: Podiatry

## 2023-01-02 ENCOUNTER — Ambulatory Visit (INDEPENDENT_AMBULATORY_CARE_PROVIDER_SITE_OTHER): Payer: Medicare Other | Admitting: Podiatry

## 2023-01-02 DIAGNOSIS — D2371 Other benign neoplasm of skin of right lower limb, including hip: Secondary | ICD-10-CM

## 2023-01-02 DIAGNOSIS — G40109 Localization-related (focal) (partial) symptomatic epilepsy and epileptic syndromes with simple partial seizures, not intractable, without status epilepticus: Secondary | ICD-10-CM

## 2023-01-02 DIAGNOSIS — K219 Gastro-esophageal reflux disease without esophagitis: Secondary | ICD-10-CM | POA: Diagnosis not present

## 2023-01-02 DIAGNOSIS — R55 Syncope and collapse: Secondary | ICD-10-CM | POA: Diagnosis not present

## 2023-01-02 DIAGNOSIS — I071 Rheumatic tricuspid insufficiency: Secondary | ICD-10-CM | POA: Diagnosis not present

## 2023-01-02 NOTE — Progress Notes (Signed)
Echocardiogram 2D Echocardiogram has been performed.  Kim Kim 01/02/2023, 3:56 PM

## 2023-01-02 NOTE — Progress Notes (Signed)
Subjective:  Patient ID: Kim Kim, female    DOB: 1955/11/10,  MRN: [id number] HPI Chief Complaint  Patient presents with   Foot Pain    Plantar heel right - small, callused area x 6 months, dermatologist has been scraping it but it gets so tender when its thick and makes it hard to walk, tried new shoes with extra cushioning   New Patient (Initial Visit)    67 y.o. female presents with the above complaint.   ROS: Denies fever chills nausea vomit muscle aches pains calf pain back pain chest pain shortness of breath.  Past Medical History:  Diagnosis Date   Allergy    seasonal allergies   Anemia    hx of   Carotid atherosclerosis    Epilepsy ([facility])    Gastric ulcer    hx of   GERD (gastroesophageal reflux disease)    on meds   Growth hormone deficiency ([place])    Hiatal hernia    Hypothyroidism    hx of Hashimoto thyroiditis   Pituitary adenoma ([place])    hx of   Seizures ([place])    temporal lobe   Thyroid disease    on meds   Vitamin D deficiency    Past Surgical History:  Procedure Laterality Date   COLONOSCOPY  12/10/2015   [place] Dr Meredith Leeds   ESOPHAGOGASTRODUODENOSCOPY  06/03/2019   Dr Celso Sickle, [place]   WISDOM TOOTH EXTRACTION      Current Outpatient Medications:    Ferrous Sulfate 140 (45 Fe) MG TBCR, Take 1 tablet by mouth every morning., Disp: , Rfl:    desonide (DESOWEN) 0.05 % cream, Apply 1 Application topically as needed., Disp: 60 g, Rfl: 3   docusate sodium (COLACE) 100 MG capsule, Take 100 mg by mouth every other day., Disp: , Rfl:    ketoconazole (NIZORAL) 2 % cream, Apply 1 Application topically 2 (two) times daily. To affected areas., Disp: 60 g, Rfl: 3   LAMICTAL 100 MG tablet, Take 1 tablet (100 mg total) by mouth 2 (two) times daily., Disp: 180 tablet, Rfl: 4   omeprazole (PRILOSEC) 20 MG capsule, Take 20 mg by mouth daily., Disp: , Rfl:    rosuvastatin (CRESTOR) 10 MG tablet, Take 10 mg by mouth at bedtime., Disp: , Rfl:    SYNTHROID  50 MCG tablet, Take 50 mcg by mouth daily before breakfast. Taking 6 days a week, Disp: , Rfl:    SYNTHROID 75 MCG tablet, Take 75 mcg by mouth daily before breakfast. Taking 1 day a week-Sunday, Disp: , Rfl:   No Known Allergies Review of Systems Objective:  There were no vitals filed for this visit.  General: Well developed, nourished, in no acute distress, alert and oriented x3   Dermatological: Skin is warm, dry and supple bilateral. Nails x 10 are well maintained; remaining integument appears unremarkable at this time. There are no open sores, no preulcerative lesions, no rash or signs of infection present.  Benign porokeratotic lesion plantar aspect of the left heel does not demonstrate any bleeding does not demonstrate any verrucoid characteristics.  Vascular: Dorsalis Pedis artery and Posterior Tibial artery pedal pulses are 2/4 bilateral with immedate capillary fill time. Pedal hair growth present. No varicosities and no lower extremity edema present bilateral.   Neruologic: Grossly intact via light touch bilateral. Vibratory intact via tuning fork bilateral. Protective threshold with Semmes Wienstein monofilament intact to all pedal sites bilateral. Patellar and Achilles deep tendon reflexes 2+ bilateral. No Babinski or  clonus noted bilateral.   Musculoskeletal: No gross boney pedal deformities bilateral. No pain, crepitus, or limitation noted with foot and ankle range of motion bilateral. Muscular strength 5/5 in all groups tested bilateral.  Gait: Unassisted, Nonantalgic.    Radiographs:  None taken  Assessment & Plan:   Assessment: Benign skin lesions plantar aspect right heel  Plan: Discussed etiology pathology conservative surgical therapies mechanically and chemically debrided the lesions today.  Placed Cantharone under occlusion to be left until tomorrow morning then washed off thoroughly follow-up with me as needed discussed padding options     Waverly Chavarria T. [place], [place]

## 2023-01-03 ENCOUNTER — Encounter (INDEPENDENT_AMBULATORY_CARE_PROVIDER_SITE_OTHER): Payer: Self-pay | Admitting: Family Medicine

## 2023-01-03 ENCOUNTER — Ambulatory Visit (INDEPENDENT_AMBULATORY_CARE_PROVIDER_SITE_OTHER): Payer: Medicare Other | Admitting: Family Medicine

## 2023-01-03 VITALS — BP 110/73 | HR 81 | Temp 97.6°F | Ht 63.0 in | Wt 167.0 lb

## 2023-01-03 DIAGNOSIS — R5383 Other fatigue: Secondary | ICD-10-CM | POA: Diagnosis not present

## 2023-01-03 DIAGNOSIS — Z6829 Body mass index (BMI) 29.0-29.9, adult: Secondary | ICD-10-CM | POA: Diagnosis not present

## 2023-01-03 DIAGNOSIS — E669 Obesity, unspecified: Secondary | ICD-10-CM

## 2023-01-03 LAB — ECHOCARDIOGRAM COMPLETE
Area-P 1/2: 3.48 cm2
Calc EF: 54.8 %
S' Lateral: 2.9 cm
Single Plane A2C EF: 60 %
Single Plane A4C EF: 50.6 %

## 2023-01-04 ENCOUNTER — Encounter: Payer: BC Managed Care – PPO | Admitting: Physician Assistant

## 2023-01-04 ENCOUNTER — Encounter: Payer: Self-pay | Admitting: Physician Assistant

## 2023-01-04 ENCOUNTER — Ambulatory Visit: Payer: Medicare Other | Attending: Physician Assistant | Admitting: Physician Assistant

## 2023-01-04 VITALS — BP 103/70 | HR 73 | Resp 14 | Ht 63.0 in | Wt 172.4 lb

## 2023-01-04 DIAGNOSIS — D352 Benign neoplasm of pituitary gland: Secondary | ICD-10-CM | POA: Diagnosis not present

## 2023-01-04 DIAGNOSIS — Z8711 Personal history of peptic ulcer disease: Secondary | ICD-10-CM | POA: Insufficient documentation

## 2023-01-04 DIAGNOSIS — E611 Iron deficiency: Secondary | ICD-10-CM | POA: Insufficient documentation

## 2023-01-04 DIAGNOSIS — E8881 Metabolic syndrome: Secondary | ICD-10-CM | POA: Insufficient documentation

## 2023-01-04 DIAGNOSIS — Z8719 Personal history of other diseases of the digestive system: Secondary | ICD-10-CM

## 2023-01-04 DIAGNOSIS — E063 Autoimmune thyroiditis: Secondary | ICD-10-CM | POA: Diagnosis not present

## 2023-01-04 DIAGNOSIS — R7303 Prediabetes: Secondary | ICD-10-CM | POA: Diagnosis present

## 2023-01-04 DIAGNOSIS — E88819 Insulin resistance, unspecified: Secondary | ICD-10-CM | POA: Insufficient documentation

## 2023-01-04 DIAGNOSIS — E538 Deficiency of other specified B group vitamins: Secondary | ICD-10-CM | POA: Diagnosis not present

## 2023-01-04 DIAGNOSIS — G40109 Localization-related (focal) (partial) symptomatic epilepsy and epileptic syndromes with simple partial seizures, not intractable, without status epilepticus: Secondary | ICD-10-CM | POA: Insufficient documentation

## 2023-01-04 DIAGNOSIS — E230 Hypopituitarism: Secondary | ICD-10-CM

## 2023-01-04 DIAGNOSIS — R948 Abnormal results of function studies of other organs and systems: Secondary | ICD-10-CM

## 2023-01-04 DIAGNOSIS — M8589 Other specified disorders of bone density and structure, multiple sites: Secondary | ICD-10-CM

## 2023-01-04 DIAGNOSIS — Q388 Other congenital malformations of pharynx: Secondary | ICD-10-CM | POA: Diagnosis not present

## 2023-01-04 DIAGNOSIS — R918 Other nonspecific abnormal finding of lung field: Secondary | ICD-10-CM | POA: Diagnosis present

## 2023-01-04 DIAGNOSIS — M19041 Primary osteoarthritis, right hand: Secondary | ICD-10-CM | POA: Insufficient documentation

## 2023-01-04 DIAGNOSIS — K5904 Chronic idiopathic constipation: Secondary | ICD-10-CM | POA: Insufficient documentation

## 2023-01-04 DIAGNOSIS — E559 Vitamin D deficiency, unspecified: Secondary | ICD-10-CM | POA: Diagnosis not present

## 2023-01-04 DIAGNOSIS — L2084 Intrinsic (allergic) eczema: Secondary | ICD-10-CM | POA: Insufficient documentation

## 2023-01-04 DIAGNOSIS — M810 Age-related osteoporosis without current pathological fracture: Secondary | ICD-10-CM

## 2023-01-04 DIAGNOSIS — K6389 Other specified diseases of intestine: Secondary | ICD-10-CM

## 2023-01-08 ENCOUNTER — Ambulatory Visit: Payer: Medicare Other

## 2023-01-08 DIAGNOSIS — G40109 Localization-related (focal) (partial) symptomatic epilepsy and epileptic syndromes with simple partial seizures, not intractable, without status epilepticus: Secondary | ICD-10-CM

## 2023-01-08 DIAGNOSIS — R55 Syncope and collapse: Secondary | ICD-10-CM

## 2023-01-09 DIAGNOSIS — R55 Syncope and collapse: Secondary | ICD-10-CM | POA: Diagnosis not present

## 2023-01-11 ENCOUNTER — Telehealth: Payer: Self-pay | Admitting: Neurology

## 2023-01-11 ENCOUNTER — Ambulatory Visit (HOSPITAL_COMMUNITY): Payer: BC Managed Care – PPO

## 2023-01-11 NOTE — Telephone Encounter (Signed)
Called the pt. She states that she has [employer] medicare now and would like to complete a new PA under the new insurance. MEMBER ID: [id number] RX BIN [id number] RX PCN: [id number] RX GROUP: [id number] Phone number [phone]  PA submited on CMM/wellcare [id number] Will await determination

## 2023-01-11 NOTE — Telephone Encounter (Signed)
Was on the phone discussing new insurance with the patient and she states she would like to pursue moving forward with the sleep study under her new insurance. She was last seen by Dr Brett Fairy in Oct 2023 so we should be able to still use that visit to complete auth. Advised I would let the sleep lab know. Her new insurance has been uploaded into the media tab

## 2023-01-11 NOTE — Telephone Encounter (Signed)
Called pt to verify insurance, pt stated insurance hasn't changed but they are in the process of down-sizing insurances. Pt stated she needs to talk to [facility] about send her in a brand name medication to [employer] [facility]. Pt would not give me the name of medication. Stated it's better that she explains to the nurse. Informed pt I would pt a message in for a nurse to give her a call.

## 2023-01-11 NOTE — Telephone Encounter (Signed)
Noted, thank you. Sent patient a Therapist, music.

## 2023-01-16 NOTE — Progress Notes (Signed)
Chief Complaint:   OBESITY Kim Kim is here to discuss her progress with her obesity treatment plan along with follow-up of her obesity related diagnoses. Kim Kim is on keeping a food journal and adhering to recommended goals of 1100-1200 calories and 75+ grams of protein and states she is following her eating plan approximately (unknown)% of the time. [name] states she is lifting weights, doing pilates and yoga for 60 minutes 4-5 times per week.  Today's visit was #: 15 Starting weight: 166 lbs Starting date: 07/12/2021 Today's weight: 167 lbs Today's date: 01/03/2023 Total lbs lost to date: 0 Total lbs lost since last in-office visit: 2  Interim History: Kim Kim continues to work on her diet and weight loss. Her last visit was approximately 5 months ago. She has been dealing with some health issues. She notes her appetite has decreased, but she is working on increasing protein.   Subjective:   1. Other fatigue Kim Kim is currently being evaluated by Cardiology. She asks about how nutrition may play a part in her fatigue. She is working on increasing her protein, plants, and fiber.   Assessment/Plan:   1. Other fatigue Kim Kim is to continue her diet, but be especially careful about sugar. Various strategies were discussed, but she will follow-up with Cardiology.   2. BMI 29.0-29.9,adult  3. Obesity, Beginning BMI 30.0 Kim Kim is currently in the action stage of change. As such, her goal is to continue with weight loss efforts. She has agreed to keeping a food journal and adhering to recommended goals of 1100-1200 calories and 75+ grams of protein daily.   Exercise goals: As is.   Behavioral modification strategies: increasing lean protein intake.  Kim Kim has agreed to follow-up with our clinic in 3 weeks. She was informed of the importance of frequent follow-up visits to maximize her success with intensive lifestyle modifications for her multiple health conditions.   Objective:   Blood  pressure 110/73, pulse 81, temperature 97.6 F (36.4 C), height 5\' 3"  (1.6 m), weight 167 lb (75.8 kg), SpO2 96 %. Body mass index is 29.58 kg/m.  Lab Results  Component Value Date   CREATININE 0.98 11/14/2022   BUN 18 11/14/2022   NA 138 11/14/2022   K 4.6 11/14/2022   CL 104 11/14/2022   CO2 26 11/14/2022   Lab Results  Component Value Date   ALT 11 11/16/2022   AST 18 11/16/2022   ALKPHOS 95 11/16/2022   BILITOT 0.3 11/16/2022   Lab Results  Component Value Date   HGBA1C 6.0 11/16/2022   HGBA1C 5.7 07/14/2021   HGBA1C 6.1 09/06/2020   HGBA1C 5.8 01/07/2020   Lab Results  Component Value Date   INSULIN 9.5 07/12/2021   Lab Results  Component Value Date   TSH 2.32 11/28/2021   Lab Results  Component Value Date   CHOL 118 11/16/2022   HDL 50.10 11/16/2022   LDLCALC 46 11/16/2022   TRIG 113.0 11/16/2022   CHOLHDL 2 11/16/2022   Lab Results  Component Value Date   VD25OH 36.99 09/06/2020   VD25OH 34.95 01/07/2020   Lab Results  Component Value Date   WBC 7.3 11/14/2022   HGB 11.8 (L) 11/14/2022   HCT 36.5 11/14/2022   MCV 83.9 11/14/2022   PLT 195 11/14/2022   Lab Results  Component Value Date   IRON 38 (L) 11/16/2022   TIBC 369.6 11/16/2022   FERRITIN 21.8 11/16/2022   Attestation Statements:   Reviewed by clinician on day of visit:  allergies, medications, problem list, medical history, surgical history, family history, social history, and previous encounter notes.  Time spent on visit including pre-visit chart review and post-visit care and charting was 32 minutes.   I, Trixie Dredge, am acting as transcriptionist for Dennard Nip, MD.  I have reviewed the above documentation for accuracy and completeness, and I agree with the above. -  Dennard Nip, MD

## 2023-01-17 ENCOUNTER — Ambulatory Visit (INDEPENDENT_AMBULATORY_CARE_PROVIDER_SITE_OTHER): Payer: Medicare Other | Admitting: Family Medicine

## 2023-01-17 VITALS — BP 132/82 | Ht 63.0 in | Wt 165.0 lb

## 2023-01-17 DIAGNOSIS — M79671 Pain in right foot: Secondary | ICD-10-CM | POA: Diagnosis not present

## 2023-01-17 NOTE — Patient Instructions (Signed)
You have plantar fasciitis Take tylenol and/or aleve as needed for pain  Plantar fascia stretch for 20-30 seconds (do 3 of these) in morning Lowering/raise on a step exercises 3 x 10 once or twice a day - this is very important for long term recovery. Can add heel walks, toe walks forward and backward as well Ice heel for 15 minutes as needed. Avoid flat shoes/barefoot walking as much as possible. Arch straps have been shown to help with pain. Inserts are important (dr. Zoe Lan active series, spencos, our green insoles, custom orthotics). Steroid injection is a consideration for short term pain relief if you are struggling. Strassburg sock at bedtime will help. Physical therapy is also an option. Follow up with me in 1 month for reevaluation.

## 2023-01-18 ENCOUNTER — Encounter: Payer: Self-pay | Admitting: Family Medicine

## 2023-01-18 ENCOUNTER — Ambulatory Visit: Payer: Medicare Other | Admitting: Family Medicine

## 2023-01-18 NOTE — Progress Notes (Signed)
PCP: Orma Render, NP  Subjective:   HPI: Patient is a 67 y.o. female here for right heel pain.  Patient denies known injury or trauma. She reports about 3 weeks ago she started to feel plantar right heel pain. Thought this was from a corn so had this treated by podiatry but pain persisted and felt more superomedial. Worse all day. Tried gel heel lift and sports insoles, some stretching without much benefit.  Past Medical History:  Diagnosis Date   Allergy    seasonal allergies   Anemia    hx of   Carotid atherosclerosis    Epilepsy ([place])    Gastric ulcer    hx of   GERD (gastroesophageal reflux disease)    on meds   Growth hormone deficiency ([place])    Hiatal hernia    Hypothyroidism    hx of Hashimoto thyroiditis   Pituitary adenoma ([place])    hx of   Seizures ([place])    temporal lobe   Thyroid disease    on meds   Vitamin D deficiency     Current Outpatient Medications on File Prior to Visit  Medication Sig Dispense Refill   desonide (DESOWEN) 0.05 % cream Apply 1 Application topically as needed. 60 g 3   docusate sodium (COLACE) 100 MG capsule Take 100 mg by mouth every other day.     Ferrous Sulfate 140 (45 Fe) MG TBCR Take 1 tablet by mouth every morning.     ketoconazole (NIZORAL) 2 % cream Apply 1 Application topically 2 (two) times daily. To affected areas. 60 g 3   LAMICTAL 100 MG tablet Take 1 tablet (100 mg total) by mouth 2 (two) times daily. 180 tablet 4   omeprazole (PRILOSEC) 20 MG capsule Take 20 mg by mouth daily.     rosuvastatin (CRESTOR) 10 MG tablet Take 10 mg by mouth at bedtime.     SYNTHROID 50 MCG tablet Take 50 mcg by mouth daily before breakfast. Taking 6 days a week     SYNTHROID 75 MCG tablet Take 75 mcg by mouth daily before breakfast. Taking 1 day a week-Sunday     No current facility-administered medications on file prior to visit.    Past Surgical History:  Procedure Laterality Date   COLONOSCOPY  12/10/2015   [place]  [place]   ESOPHAGOGASTRODUODENOSCOPY  06/03/2019   Dr Cannon [place], [place]   WISDOM TOOTH EXTRACTION      No Known Allergies  BP 132/82   Ht 5\' 3" (1.6 m)   Wt 165 lb (74.8 kg)   BMI 29.23 kg/m      11 /16/2022    2:12 PM  [facility]  Frequency of aerobic exercise (# of days/week) 4  Average time in minutes 60  Frequency of strengthening activities (# of days/week) 4        No data to display              Objective:  Physical Exam:  Gen: NAD, comfortable in exam room  Right foot/ankle: No gross deformity, swelling, ecchymoses FROM Mild TTP proximal plantar fascia and insertion on anteromedial calcaneus. Negative ant drawer and negative talar tilt.   Negative calcaneal squeeze. NV intact distally.   Assessment & Plan:  1. Right plantar fasciitis - shown home exercises to do daily.  Arch binder, sports insoles without scaphoid pads (felt more pain with the pads).  Strassburg sock.  Icing, tylenol and/or aleve.  Consider physical therapy,  custom orthotics, injection.  F/u in 1 month.

## 2023-01-29 DIAGNOSIS — K63829 Intestinal methanogen overgrowth, unspecified: Secondary | ICD-10-CM | POA: Diagnosis not present

## 2023-01-30 DIAGNOSIS — B379 Candidiasis, unspecified: Secondary | ICD-10-CM | POA: Diagnosis not present

## 2023-01-30 DIAGNOSIS — R7303 Prediabetes: Secondary | ICD-10-CM | POA: Diagnosis not present

## 2023-01-30 DIAGNOSIS — K63829 Intestinal methanogen overgrowth, unspecified: Secondary | ICD-10-CM | POA: Diagnosis not present

## 2023-01-30 DIAGNOSIS — K5229 Other allergic and dietetic gastroenteritis and colitis: Secondary | ICD-10-CM | POA: Diagnosis not present

## 2023-01-30 DIAGNOSIS — R198 Other specified symptoms and signs involving the digestive system and abdomen: Secondary | ICD-10-CM | POA: Diagnosis not present

## 2023-01-31 DIAGNOSIS — E782 Mixed hyperlipidemia: Secondary | ICD-10-CM | POA: Diagnosis not present

## 2023-02-01 ENCOUNTER — Ambulatory Visit: Payer: Medicare Other | Admitting: Cardiology

## 2023-02-01 ENCOUNTER — Encounter: Payer: Self-pay | Admitting: Cardiology

## 2023-02-01 VITALS — BP 141/85 | HR 71 | Resp 16 | Ht 63.0 in | Wt 173.0 lb

## 2023-02-01 DIAGNOSIS — R55 Syncope and collapse: Secondary | ICD-10-CM

## 2023-02-01 DIAGNOSIS — I209 Angina pectoris, unspecified: Secondary | ICD-10-CM

## 2023-02-01 DIAGNOSIS — G40109 Localization-related (focal) (partial) symptomatic epilepsy and epileptic syndromes with simple partial seizures, not intractable, without status epilepticus: Secondary | ICD-10-CM

## 2023-02-01 NOTE — Progress Notes (Signed)
Primary Physician/Referring:  Orma Render, NP  Patient ID: Kim Kim, female    DOB: 17-Sep-1956, 67 y.o.   MRN: [id number]  Chief Complaint  Patient presents with   Angina pectoris with normal coronary arteriogram [facility]) 02/2   Follow-up    6 weeks   HPI:    Kim Kim  is a 67 y.o. Caucasian female with a history of GERD, B12 deficiency, hiatal hernia, PUD with gastric ulcer in 2020 due to NSAID, history of pituitary adenoma, seizure disorder (temporal lobe epilepsy), history of Hashimoto's thyroiditis with subsequent hypothyroidism, hyperlipidemia, prediabetes mellitus, family history of premature coronary artery disease, exertional chest pain suggestive of angina but has had extensive cardiac workup in the past and has been negative, coronary CTA normal, coronary calcium score 0 and cardiopulmonary stress test except for exercise-induced LBBB and chest pain, had good exercise tolerance. GI workup has been negative.  She presents for recurrent episodes of exertional syncope that started in January 2024.  She has also had couple episodes of near syncope while driving.  1 significant episode was during exercise on 11/14/2022 where she had syncope followed by loss of bladder control.  This is a 6-week visit. She is feeling the best she has in quite a while, states that her strength is gradually coming back.  She is now thinking that her episode started after a very bad respiratory infection that occurred in February 2024, thinks that she may have had RSV infection.  Past Medical History:  Diagnosis Date   Allergy    seasonal allergies   Anemia    hx of   Carotid atherosclerosis    Epilepsy    Gastric ulcer    hx of   GERD (gastroesophageal reflux disease)    on meds   Growth hormone deficiency    Hiatal hernia    Hypothyroidism    hx of Hashimoto thyroiditis   Pituitary adenoma    hx of   Seizures    temporal lobe   Thyroid disease    on meds   Vitamin D deficiency     Past Surgical History:  Procedure Laterality Date   COLONOSCOPY  12/10/2015   [place]   ESOPHAGOGASTRODUODENOSCOPY  06/03/2019   Dr Celso [place], [place]   WISDOM TOOTH EXTRACTION       Social History   Tobacco Use   Smoking status: Never    Passive exposure: Never   Smokeless tobacco: Never  Substance Use Topics   Alcohol use: Not Currently    Comment: socially   Marital Status: Married  ROS  Review of Systems  Cardiovascular:  Negative for chest pain, dyspnea on exertion and leg swelling.   Objective  Blood pressure (!) 141/85, pulse 71, resp. rate 16, height 5\' 3"  (1.6 m), weight 173 lb (78.5 kg), SpO2 98 %.     02/01/2023   10:16 AM 01/17/2023    2:45 PM 01/04/2023   11:03 AM  Vitals with BMI  Height 5\' 3"  5\' 3"    Weight 173 lbs 165 lbs   BMI 99991111 [id number]   Systolic Q000111Q Q000111Q [id number]  Diastolic 85 82 70  Pulse 71  73    Orthostatic VS for the past 72 hrs (Last 3 readings):  Patient Position BP Location Cuff Size  02/01/23 1016 Sitting Left Arm Normal     Physical Exam Neck:     Vascular: No carotid bruit or JVD.  Cardiovascular:     Rate and Rhythm: Normal  rate and regular rhythm.     Pulses: Intact distal pulses.     Heart sounds: Normal heart sounds. No murmur heard.    No gallop.  Pulmonary:     Effort: Pulmonary effort is normal.     Breath sounds: Normal breath sounds.  Abdominal:     General: Bowel sounds are normal.     Palpations: Abdomen is soft.  Musculoskeletal:     Right lower leg: No edema.     Left lower leg: No edema.    Laboratory examination:   Recent Labs    11/14/22 1207  NA 138  K 4.6  CL 104  CO2 26  GLUCOSE 96  BUN 18  CREATININE 0.98  CALCIUM 9.6  GFRNONAA >60      Latest Ref Rng & Units 11/16/2022    4:36 PM 11/14/2022   12:07 PM 07/14/2021   12:00 AM  CMP  Glucose 70 - 99 mg/dL  96    BUN 8 - 23 mg/dL  18  17      Creatinine 0.44 - 1.00 mg/dL  0.98  0.9      Sodium 135 - 145 mmol/L  138  139       Potassium 3.5 - 5.1 mmol/L  4.6  4.2      Chloride 98 - 111 mmol/L  104  104      CO2 22 - 32 mmol/L  26  26      Calcium 8.9 - 10.3 mg/dL  9.6    Total Protein 6.0 - 8.3 g/dL 7.8     Total Bilirubin 0.2 - 1.2 mg/dL 0.3     Alkaline Phos 39 - 117 U/L 95   112      AST 0 - 37 U/L 18   25      ALT 0 - 35 U/L 11   17         This result is from an external source.      Latest Ref Rng & Units 11/14/2022   12:07 PM 09/29/2020    1:36 PM 01/07/2020   11:19 AM  CBC  WBC 4.0 - 10.5 K/uL 7.3  7.1  7.0   Hemoglobin 12.0 - 15.0 g/dL 11.8  12.0  12.5   Hematocrit 36.0 - 46.0 % 36.5  36.1  37.6   Platelets 150 - 400 K/uL 195  242.0  250.0    Lab Results  Component Value Date   CHOL 118 11/16/2022   HDL 50.10 11/16/2022   LDLCALC 46 11/16/2022   TRIG 113.0 11/16/2022   CHOLHDL 2 11/16/2022     HEMOGLOBIN A1C Lab Results  Component Value Date   HGBA1C 6.0 11/16/2022   TSH No results for input(s): "TSH" in the last 8760 hours.   External labs:   Labs 04/27/2022:  Vitamin D 62, TSH normal at 1.94.  A1c 5.7%.  Total cholesterol 127, triglycerides 84, HDL 54, LDL 56, non-HDL cholesterol 73.   Medications and allergies  No Known Allergies   Current Outpatient Medications:    desonide (DESOWEN) 0.05 % cream, Apply 1 Application topically as needed., Disp: 60 g, Rfl: 3   docusate sodium (COLACE) 100 MG capsule, Take 100 mg by mouth every other day., Disp: , Rfl:    Ferrous Sulfate 140 (45 Fe) MG TBCR, Take 1 tablet by mouth every morning., Disp: , Rfl:    ketoconazole (NIZORAL) 2 % cream, Apply 1 Application topically 2 (two) times daily. To affected areas.,  Disp: 60 g, Rfl: 3   LAMICTAL 100 MG tablet, Take 1 tablet (100 mg total) by mouth 2 (two) times daily., Disp: 180 tablet, Rfl: 4   omeprazole (PRILOSEC) 20 MG capsule, Take 20 mg by mouth daily., Disp: , Rfl:    rosuvastatin (CRESTOR) 10 MG tablet, Take 10 mg by mouth at bedtime., Disp: , Rfl:    SYNTHROID 50 MCG tablet,  Take 50 mcg by mouth daily before breakfast. Taking 6 days a week, Disp: , Rfl:    SYNTHROID 75 MCG tablet, Take 75 mcg by mouth daily before breakfast. Taking 1 day a week-Sunday, Disp: , Rfl:    Radiology:   Chest x-ray PA and lateral view 12/10/2020: Heart and mediastinal contours are within normal limits.  Both lungs are clear.  Cardiac Studies:   Coronary CT angiogram 01-05-19: Normal coronary arteries. Right dominant circulation. Coronary calcium score not done. (Calcium score of 0 on 11/13/2017).  Cardiopulmonary stress test 01/13/2021: Exercise testing with gas exchange indicates a normal functional capacity when compared to matched sedentary norms. There is no clear cardiopulmonary limitation. VE/VCO2 slope is slightly elevated and could indicate increased pulmonary pressures during exercise. Note exercise induced LBBB. Normal BP response.    Exercise Time:    11:45   Speed (mph): 3.0       Grade (%): 15.0    RPE: 17  Reason stopped: Chest pain (9/10).  Additional symptoms: Dyspnea (3/10)   Echocardiogram 01/02/2023: 1. Left ventricular ejection fraction, by estimation, is 60 to 65%. The left ventricle has normal function. The left ventricle has no regional wall motion abnormalities. Left ventricular diastolic parameters were normal. 2. Right ventricular systolic function is normal. The right ventricular size is normal. There is normal pulmonary artery systolic pressure. The estimated right ventricular systolic pressure is 99991111 mmHg. 3. The mitral valve is normal in structure. Trivial mitral valve regurgitation. No evidence of mitral stenosis. 4. The aortic valve is normal in structure. Aortic valve regurgitation is not visualized. No aortic stenosis is present. 5. The inferior vena cava is normal in size with greater than 50% respiratory variability, suggesting right atrial pressure of 3 mmHg.  6.  No significant change from 02/12/2019.  [facility] CARDIAC STRESS TEST  01/08/2023  Narrative Exercise treadmill stress test 01/08/2023: Exercise treadmill stress test performed using Bruce protocol.  Patient reached 7.6 METS, and 101% of age predicted maximum heart rate.  Exercise capacity was fair.  No chest pain reported.  Normal heart rate and hemodynamic response. Stress EKG revealed no ischemic changes. Low risk study.  [facility] Extended out patient EKG monitoring 14 days starting 01/08/2023:  Predominant Rhythm : Normal sinus rhythm min HR: 51. Max HR 133 bpm  Atrial arrhythmias: Rare PACs  Atrial fibrillation: No atrial fibrillation  Ventricular arrhythmias: Rare PVCs. PVC Burden <0.1%  Heart Block: None  Symptoms: No symptoms reported.  Unremarkable study.     EKG:   EKG 11/16/2022: Normal sinus rhythm at a rate of 70 bpm, incomplete right bundle branch block.  Normal EKG.  No change from 07/12/2021.  Assessment     ICD-10-CM   1. Angina pectoris with normal coronary arteriogram  I20.9     2. Syncope and collapse  R55     3. TLE (temporal lobe epilepsy)  G40.109       There are no discontinued medications.   No orders of the defined types were placed in this encounter.  No orders of the defined types were placed in  this encounter.  Recommendations:   Kim Kim is a 67 y.o. Caucasian female with a history of GERD, B12 deficiency, hiatal hernia, PUD with gastric ulcer in 2020 due to NSAID, history of pituitary adenoma, seizure disorder (temporal lobe epilepsy), history of Hashimoto's thyroiditis with subsequent hypothyroidism, hyperlipidemia, prediabetes mellitus, family history of premature coronary artery disease, exertional chest pain suggestive of angina but has had extensive cardiac workup in the past and has been negative, coronary CTA normal, coronary calcium score 0 and cardiopulmonary stress test except for exercise-induced LBBB and chest pain, had good exercise tolerance. GI workup has been negative.  She presents for  recurrent episodes of exertional syncope that started in January 2024.  She has also had couple episodes of near syncope while driving.  1 significant episode was during exercise on 11/14/2022 where she had syncope followed by loss of bladder control.  This is a 6-week visit.  1. Angina pectoris with normal coronary arteriogram Patient presently doing well and has not had any recurrence of angina pectoris.  I am seeing her mostly for syncope.  She feels the best she has in a while.  2. Syncope and collapse She has not had any further episodes of syncope.  She has gradually started to exercise.  The thought process is whether the episode of syncope that followed recent upper respiratory infection, patient thinks that as it was so severe and debilitating that it could have been most probably RSV infection.  Overall she has not had any further episodes, I was present during that treadmill stress testing and she had normal hemodynamic response as well.  Advised her to resume activity but to start slow.  Presently she remains asymptomatic and is doing well, I will see him back in a year for follow-up.  3. TLE (temporal lobe epilepsy) Patient was recommended that she be hospitalized and come off of antiseizure medications and watch to see whether her symptoms are related to TLE.  Patient states that the episodes that she had in the car where she felt she was going to pass out were probably TLE and she prefers to continue antiseizure medications.  I could have seen her on a as needed basis however patient likes to see me on annual basis.  Hence we will see her back in a year.    Adrian Prows, MD, [facility] 02/01/2023, 12:15 PM Office: [phone]          CC: Angelena Sole, MD (neuro)

## 2023-02-05 ENCOUNTER — Ambulatory Visit: Payer: BC Managed Care – PPO | Admitting: Physician Assistant

## 2023-02-05 NOTE — Telephone Encounter (Signed)
Called pt and LVM for her to call back and schedule a OV or VV.

## 2023-02-06 ENCOUNTER — Ambulatory Visit: Payer: BC Managed Care – PPO | Admitting: Physician Assistant

## 2023-02-07 ENCOUNTER — Ambulatory Visit: Payer: BC Managed Care – PPO | Admitting: Physician Assistant

## 2023-02-07 ENCOUNTER — Encounter: Payer: Self-pay | Admitting: Neurology

## 2023-02-07 NOTE — Telephone Encounter (Signed)
Pt called back. She was scheduled an office visit with Dr. Vickey Huger on 5/7@ 10 am. Pt was added to wait-list because she would like a sooner appointment.

## 2023-02-07 NOTE — Telephone Encounter (Signed)
Called pt and LVM for her to call back and schedule a OV or VV.

## 2023-02-12 ENCOUNTER — Ambulatory Visit (INDEPENDENT_AMBULATORY_CARE_PROVIDER_SITE_OTHER): Payer: Medicare Other | Admitting: Neurology

## 2023-02-12 ENCOUNTER — Telehealth: Payer: Self-pay | Admitting: Neurology

## 2023-02-12 ENCOUNTER — Encounter: Payer: Self-pay | Admitting: Neurology

## 2023-02-12 VITALS — BP 134/83 | HR 79 | Ht 64.0 in | Wt 170.0 lb

## 2023-02-12 DIAGNOSIS — Z87730 Personal history of (corrected) cleft lip and palate: Secondary | ICD-10-CM

## 2023-02-12 DIAGNOSIS — R4689 Other symptoms and signs involving appearance and behavior: Secondary | ICD-10-CM

## 2023-02-12 DIAGNOSIS — G4730 Sleep apnea, unspecified: Secondary | ICD-10-CM

## 2023-02-12 NOTE — Progress Notes (Signed)
Provider:  Melvyn Novas, MD  Primary Care Physician:  Tollie Eth, NP [address] [place] [address]     Referring Provider: Dr Yetta Barre, MD [place] Primary Neurologist : Dr. Marjory Lies, MD    Early, [name], Np [address] [place],  [place] [address]          Chief Complaint according to patient   Patient presents with:     New Patient (Initial Visit)           HISTORY OF PRESENT ILLNESS:  Kim Kim is a 67 y.o. female patient who is here for revisit 02/12/2023 as she needed a new order for a sleep study.   02-12-2023: Chief concern according to patient :  Pt here today for a follow up visit due to switching to medicare. Needed updated follow up visit to document the need for a SS and reorder study. "We just have been between having colds and viral illnesses, and I finally came around to do the sleep study". She has had reportedly abnormal EEGs in [place], at [facility].   Has seen Dr. Marjory Lies 12-19-2022.   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         Kim Kim is a 70 -year-old caucasian female patient seen here upon referral on 08/07/2022 from  Dr. Yetta Barre.  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 She reports tonsil stones, and rhinitis. She is not known to snore. Nasal speech. She has insomnia, but not every night. Not a problem to go to sleep , but stay asleep, 4 times a months.     Kim Kim   has a past medical history of Allergy- rhinitis , Vit B12 deficiency Anemia, Carotid atherosclerosis, Temporal Epilepsy (HCC), Gastric ulcer, GERD (gastroesophageal reflux disease(HCC), Hiatal hernia, Hypothyroidism, Pituitary adenoma (HCC), thyroid nodules,  Hashimotos -autoimmune Thyroid disease, and Vitamin D deficiency.     Sleep relevant medical history: Nocturia one time , no Tonsillectomy, deviated septum but no repair,  cleft palate.     Family medical /sleep history: 2 siblings on CPAP with OSA.    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  She worked daytime. She  lives in a household with spouse, children in [place] and [place],  Pets are not present. Tobacco use- never .  ETOH use ; infrequently - not in months, Caffeine intake in form of Coffee( 1  day) Soda( /) Tea ( /) or energy drinks. Regular exercise in form of daily strength training.          Sleep habits are as follows: The patient's dinner time is between 5 PM. The patient goes to bed at 8 PM to watch TV and sleeps at  9 PM and continues to sleep for 6-7 hours, wakes for one or none  bathroom breaks, the first time at 2-3 AM.  No sleep related seizures known. She has a deja-vue, same dreams over and over again.   The preferred sleep position is laterally , with the support of 1-2 pillows.  Dreams are reportedly frequent/vivid. She remembers her dreams.  7-7   AM is the usual rise time. The patient wakes up spontaneously at 6-7 AM  She reports not feeling refreshed or restored in AM, with symptoms such as dry mouth, phlegm .  Naps are taken infrequently, lasting from 15 to 30 minutes .   Review of Systems: Out of a complete 14 system review, the patient complains of only the following symptoms, and all other reviewed systems are negative.:  Fatigue, sleepiness , snoring, fragmented sleep, Insomnia - early waking up.    How likely are you to doze in the following situations: 0 = not likely, 1 = slight chance, 2 = moderate chance, 3 = high chance   Sitting and Reading? Watching Television? Sitting inactive in a public place (theater or meeting)? As a passenger in a car for an hour without a break? 0 Lying down in the afternoon when circumstances permit?2 Sitting and talking to someone? Sitting quietly after lunch without alcohol? In a car, while stopped for a few minutes in traffic?   Total = 2/ 24  points   FSS endorsed at 11/ 63 points.     She denies depression.         Social History   Socioeconomic History   Marital status: Married    Spouse name: Kim Kim   Number of children: 3   Years of education: Not on file   Highest education level: Associate degree: occupational, Scientist, product/process development, or vocational program  Occupational History    Comment: NA  Tobacco Use   Smoking status: Never    Passive exposure: Never   Smokeless tobacco: Never  Vaping Use   Vaping Use: Never used  Substance and Sexual Activity   Alcohol use: Not Currently    Comment: socially   Drug use: Never   Sexual activity: Yes    Partners: Male    Birth control/protection: Post-menopausal  Other Topics Concern   Not on file  Social History Narrative   She recently moved from [place]. 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   Caffeine- coffee maybe 1 c, some tea   Social Determinants of Corporate investment banker Strain: Not on file  Food Insecurity: Not on file  Transportation Needs: Not on file  Physical Activity: Not on file  Stress: Not on file  Social Connections: Not on file    Family History  Problem Relation Age of Onset   Hypertension Mother    Heart disease Mother    Diabetes Mother    Thyroid disease Mother        hypo   Thyroid nodules Mother    Hyperlipidemia Father    Heart disease Father    Hypertension Father    Hypertension Sister    Hearing loss Sister    Hypertension Sister    Hearing loss Sister    Diabetes Sister    Hypertension Sister    Hearing loss Sister    Heart disease Brother    Esophageal cancer Neg Hx    Colon cancer Neg Hx    Colon polyps Neg Hx    Stomach cancer Neg Hx    Rectal cancer Neg Hx     Past Medical History:  Diagnosis Date   Allergy    seasonal allergies   Anemia    hx of   Carotid atherosclerosis    Epilepsy    Gastric ulcer    hx of   GERD (gastroesophageal reflux disease)    on meds   Growth hormone  deficiency    Hiatal hernia    Hypothyroidism    hx of Hashimoto thyroiditis   Pituitary adenoma    hx of   Seizures  temporal lobe   Thyroid disease    on meds   Vitamin D deficiency     Past Surgical History:  Procedure Laterality Date   COLONOSCOPY  12/10/2015   [place] Dr Lysle Morales   ESOPHAGOGASTRODUODENOSCOPY  06/03/2019   Dr Saunders [place], [place]   WISDOM TOOTH EXTRACTION       Current Outpatient Medications on File Prior to Visit  Medication Sig Dispense Refill   desonide (DESOWEN) 0.05 % cream Apply 1 Application topically as needed. 60 g 3   docusate sodium (COLACE) 100 MG capsule Take 100 mg by mouth every other day.     Ferrous Sulfate 140 (45 Fe) MG TBCR Take 1 tablet by mouth every morning.     ketoconazole (NIZORAL) 2 % cream Apply 1 Application topically 2 (two) times daily. To affected areas. 60 g 3   LAMICTAL 100 MG tablet Take 1 tablet (100 mg total) by mouth 2 (two) times daily. 180 tablet 4   omeprazole (PRILOSEC) 20 MG capsule Take 20 mg by mouth daily.     rosuvastatin (CRESTOR) 10 MG tablet Take 10 mg by mouth at bedtime.     SYNTHROID 50 MCG tablet Take 50 mcg by mouth daily before breakfast. Taking 6 days a week     SYNTHROID 75 MCG tablet Take 75 mcg by mouth daily before breakfast. Taking 1 day a week-Sunday     No current facility-administered medications on file prior to visit.    No Known Allergies   DIAGNOSTIC DATA (LABS, IMAGING, TESTING) - I reviewed patient records, labs, notes, testing and imaging myself where available.  Lab Results  Component Value Date   WBC 7.3 11/14/2022   HGB 11.8 (L) 11/14/2022   HCT 36.5 11/14/2022   MCV 83.9 11/14/2022   PLT 195 11/14/2022      Component Value Date/Time   NA 138 11/14/2022 1207   NA 139 07/14/2021 0000   K 4.6 11/14/2022 1207   CL 104 11/14/2022 1207   CO2 26 11/14/2022 1207   GLUCOSE 96 11/14/2022 1207   BUN 18 11/14/2022 1207   BUN 17 07/14/2021 0000   CREATININE 0.98  11/14/2022 1207   CALCIUM 9.6 11/14/2022 1207   PROT 7.8 11/16/2022 1636   PROT 7.9 07/12/2021 0948   ALBUMIN 4.4 11/16/2022 1636   ALBUMIN 5.1 (H) 07/12/2021 0948   AST 18 11/16/2022 1636   ALT 11 11/16/2022 1636   ALKPHOS 95 11/16/2022 1636   BILITOT 0.3 11/16/2022 1636   BILITOT 0.4 07/12/2021 0948   GFRNONAA >60 11/14/2022 1207   Lab Results  Component Value Date   CHOL 118 11/16/2022   HDL 50.10 11/16/2022   LDLCALC 46 11/16/2022   TRIG 113.0 11/16/2022   CHOLHDL 2 11/16/2022   Lab Results  Component Value Date   HGBA1C 6.0 11/16/2022   Lab Results  Component Value Date   VITAMINB12 >1500 (H) 11/16/2022   Lab Results  Component Value Date   TSH 2.32 11/28/2021    PHYSICAL EXAM:  Today's Vitals   02/12/23 0812  BP: 134/83  Pulse: 79  Weight: 170 lb (77.1 kg)  Height: 5\' 4"  (1.626 m)   Body mass index is 29.18 kg/m.   Wt Readings from Last 3 Encounters:  02/12/23 170 lb (77.1 kg)  02/01/23 173 lb (78.5 kg)  01/17/23 165 lb (74.8 kg)     Ht Readings from Last 3 Encounters:  02/12/23 5\' 4"  (1.626 m)  02/01/23 5\' 3"  (1.6 m)  01/17/23  (1.6 m)      General:The patient is awake, alert and appears not in acute distress. The patient is well groomed. Head: Normocephalic, atraumatic. Neck is supple.  Mallampati 3,  neck circumference:13.5  inches . Nasal airflow not fully patent.  Retrognathia is not seen. Small oral opening.  Dental status: regular but small.  Cardiovascular:  Regular rate and cardiac rhythm by pulse,  without distended neck veins. Respiratory: Lungs are clear to auscultation.  Skin:  Without evidence of ankle edema, or rash. Trunk: The patient's posture is erect.   Neurologic exam : The patient is awake and alert, oriented to place and time.   Memory subjective described as intact.  Attention span & concentration ability appears normal.  Speech is fluent,  with 'nasal speech"  dysphonia.  Mood and affect are appropriate.    Cranial nerves: no loss of smell or taste reported  Pupils are equal and briskly reactive to light. Funduscopic exam deferred. Extraocular movements in vertical and horizontal planes were intact and without nystagmus. No Diplopia. Visual fields by finger perimetry are intact. Hearing was intact to soft voice and finger rubbing.   Facial sensation intact to fine touch.  Facial motor strength is symmetric and tongue and uvula move midline.  Neck ROM : rotation, tilt and flexion extension were normal for age and shoulder shrug was symmetrical.    Motor exam:  Symmetric bulk, tone and ROM.   Normal tone without cog wheeling, symmetric grip strength .   Sensory:  Fine touch and vibration were normal. Left foot tingling, like ants crawling.  Proprioception tested in the upper extremities was normal.   Coordination: Rapid alternating movements in the fingers/hands were of normal speed.  The Finger-to-nose maneuver was intact without evidence of ataxia, dysmetria or tremor.   Gait and station: Patient could rise unassisted from a seated position, walked without assistive device.  Stance is of normal width/ base and the patient turned with 3 steps.  Toe and heel walk were deferred.  Deep tendon reflexes: in the  upper and lower extremities are symmetric and intact.  Babinski response was deferred.     ASSESSMENT AND PLAN 67 y.o. year old female  here with:    1) need to reschedule a proper in -lab polysomnography. This patietn will need  EEG  to accompany her sleep study. 1)  insomnia is a rare occasion for her. She has 4 days a months and its when she wakes up to go to the bathroom, and her mind will race and she is worried. That's when she can't easily go back to sleep.  2) husband has not told her if she snored.  3) mouth breather. Night guard for bruxism.  4) nasal septum deviation , the right nares is smaller.  5) Lamictal has restored her sleep, started 5 years ago.    My Plan is to  proceed with:   1) Screening for sleep apnea in a patient with cleft palate , had surgery at age 10-6 and soft palate was altered.  2) no bone transplant, her cleft palate was only covered by soft tissue.  She cannot snore !!!   3) nasal breathing is laborious, mouth breathe may still not snore due to her soft palate being not present. I prefer an in- lab sleep study.    4) GERD with apnea at night is in the differential.      I would like to thank Etta Grandchild, MD and Renne Crigler,  [place], [place] [address] [place],  [place] [address] for allowing me to meet with and to take care of this pleasant patient.    In short, Kim Kim is presenting with nasal speech, and cleft palate, and some nights of insomnia.   I plan to follow up either personally or through our NP  if the PSG is positive for apnea, within  2-4 months.      Cc Dr Marjory Lies, MD           Electronically signed by: Melvyn Novas, MD 02/12/2023 8:21 AM  [facility] and [employer] certified by [employer] of Sleep Medicine and Diplomate of the [employer] of Sleep Medicine. Board certified In Neurology through the ABPN, Fellow of the [employer] of Neurology. Medical Director of [employer].

## 2023-02-12 NOTE — Telephone Encounter (Signed)
NPSG- Medicare/aarp no auth req.  Patient is scheduled at [facility] for 03/18/23 at 9 pm.  Mailed packet to the patient.

## 2023-02-12 NOTE — Addendum Note (Signed)
Addended by: Melvyn Novas on: 02/12/2023 08:44 AM   Modules accepted: Orders

## 2023-02-13 ENCOUNTER — Telehealth: Payer: Self-pay | Admitting: Diagnostic Neuroimaging

## 2023-02-13 ENCOUNTER — Telehealth: Payer: Self-pay | Admitting: *Deleted

## 2023-02-13 NOTE — Telephone Encounter (Signed)
Kim Kim from [facility]. States they need diagnosis code to start PA for LAMICTAL 100 MG tablet

## 2023-02-13 NOTE — Telephone Encounter (Signed)
Fax confirmation received [facility]. for lower tier request.  [phone].

## 2023-02-14 NOTE — Telephone Encounter (Signed)
I have completed the form indicating the diagnosis and ICD code and resent the form for the tier exception. Received confirmation that it went through

## 2023-02-19 ENCOUNTER — Encounter: Payer: Self-pay | Admitting: Family Medicine

## 2023-02-19 ENCOUNTER — Ambulatory Visit (INDEPENDENT_AMBULATORY_CARE_PROVIDER_SITE_OTHER): Payer: Medicare Other | Admitting: Family Medicine

## 2023-02-19 VITALS — BP 130/70 | Ht 64.0 in | Wt 165.0 lb

## 2023-02-19 DIAGNOSIS — M79671 Pain in right foot: Secondary | ICD-10-CM

## 2023-02-19 NOTE — Progress Notes (Signed)
PCP: Tollie Eth, NP  Subjective:   HPI: Patient is a 67 y.o. female here for right heel pain.  3/20: Patient denies known injury or trauma. She reports about 3 weeks ago she started to feel plantar right heel pain. Thought this was from a corn so had this treated by podiatry but pain persisted and felt more superomedial. Worse all day. Tried gel heel lift and sports insoles, some stretching without much benefit.  4/22: Patient reports feeling maybe a little better. Doing home exercises, wearing arch binder (though this is rotating around on her), sports insoles (scaphoid pad was too much in addition to this). Pain plantar heel. No new injuries.  Past Medical History:  Diagnosis Date   Allergy    seasonal allergies   Anemia    hx of   Carotid atherosclerosis    Epilepsy    Gastric ulcer    hx of   GERD (gastroesophageal reflux disease)    on meds   Growth hormone deficiency    Hiatal hernia    Hypothyroidism    hx of Hashimoto thyroiditis   Pituitary adenoma    hx of   Seizures    temporal lobe   Thyroid disease    on meds   Vitamin D deficiency     Current Outpatient Medications on File Prior to Visit  Medication Sig Dispense Refill   desonide (DESOWEN) 0.05 % cream Apply 1 Application topically as needed. 60 g 3   docusate sodium (COLACE) 100 MG capsule Take 100 mg by mouth every other day.     Ferrous Sulfate 140 (45 Fe) MG TBCR Take 1 tablet by mouth every morning.     ketoconazole (NIZORAL) 2 % cream Apply 1 Application topically 2 (two) times daily. To affected areas. 60 g 3   LAMICTAL 100 MG tablet Take 1 tablet (100 mg total) by mouth 2 (two) times daily. 180 tablet 4   omeprazole (PRILOSEC) 20 MG capsule Take 20 mg by mouth daily.     rosuvastatin (CRESTOR) 10 MG tablet Take 10 mg by mouth at bedtime.     SYNTHROID 50 MCG tablet Take 50 mcg by mouth daily before breakfast. Taking 6 days a week     SYNTHROID 75 MCG tablet Take 75 mcg by mouth daily  before breakfast. Taking 1 day a week-Sunday     No current facility-administered medications on file prior to visit.    Past Surgical History:  Procedure Laterality Date   COLONOSCOPY  12/10/2015   [place] Dr [place]   ESOPHAGOGASTRODUODENOSCOPY  06/03/2019   Dr Cannon [place], [place]   WISDOM TOOTH EXTRACTION      No Known Allergies  BP 130/70   Ht 5\' 4" (1.626 m)   Wt 165 lb (74.8 kg)   BMI 28.32 kg/m      11 /16/2022    2:12 PM  [facility]  Frequency of aerobic exercise (# of days/week) 4  Average time in minutes 60  Frequency of strengthening activities (# of days/week) 4        No data to display              Objective:  Physical Exam:  Gen: NAD, comfortable in exam room  Right foot/ankle: No gross deformity, swelling, ecchymoses FROM Mild TTP plantar fascia insertion on medial calcaneus Negative calcaneal squeeze. Thompsons test negative. NV intact distally.   Assessment & Plan:  1. Right plantar fasciitis - continue with home exercises,  arch binder, sports insoles.  Discussed options - started shockwave treatments today.  F/u in 1 week.  Procedure: ECSWT Indications:  right plantar fasciitis   Procedure Details Consent: Risks of procedure as well as the alternatives and risks of each were explained to the patient.  Written consent for procedure obtained. Time Out: Verified patient identification, verified procedure, site was marked, verified correct patient position, medications/allergies/relevent history reviewed.  The area was cleaned with alcohol swab.     The right plantar fascia was targeted for Extracorporeal shockwave therapy.    Preset: plantar fasciitis Power Level: 110 Frequency: 12 Impulse/cycles: 2500 Head size: large   Patient tolerated procedure well without immediate complications

## 2023-02-26 ENCOUNTER — Ambulatory Visit (INDEPENDENT_AMBULATORY_CARE_PROVIDER_SITE_OTHER): Payer: Self-pay | Admitting: Family Medicine

## 2023-02-26 ENCOUNTER — Encounter: Payer: Self-pay | Admitting: Family Medicine

## 2023-02-26 DIAGNOSIS — M79671 Pain in right foot: Secondary | ICD-10-CM

## 2023-02-26 NOTE — Progress Notes (Signed)
Patient returns today for second shockwave treatment for her right plantar fasciitis. She does feel as though this is helping. Has hyperkeratotic area under heel that was treated also by podiatry and we discussed strategies to treat and offload this.  Procedure: ECSWT Indications:  right plantar fasciitis   Procedure Details Consent: Risks of procedure as well as the alternatives and risks of each were explained to the patient.  Written consent for procedure obtained. Time Out: Verified patient identification, verified procedure, site was marked, verified correct patient position, medications/allergies/relevent history reviewed.  The area was cleaned with alcohol swab.     The right plantar fascia was targeted for Extracorporeal shockwave therapy.    Preset: plantar fasciitis Power Level: 120 Frequency: 12 Impulse/cycles: 2500 Head size: large   Patient tolerated procedure well without immediate complications

## 2023-02-26 NOTE — Telephone Encounter (Signed)
Pt called wanting to inform RN that [facility] approved her for tier 5 and they are needing a new Rx faxed over to them. Fax # [phone]

## 2023-02-27 NOTE — Telephone Encounter (Signed)
Pt stated she is going to follow-up with insurance today and call the office back with more information.

## 2023-02-28 ENCOUNTER — Encounter (INDEPENDENT_AMBULATORY_CARE_PROVIDER_SITE_OTHER): Payer: Self-pay | Admitting: Family Medicine

## 2023-02-28 ENCOUNTER — Ambulatory Visit (INDEPENDENT_AMBULATORY_CARE_PROVIDER_SITE_OTHER): Payer: Medicare Other | Admitting: Family Medicine

## 2023-02-28 VITALS — BP 108/72 | HR 66 | Temp 97.7°F | Ht 64.0 in | Wt 170.0 lb

## 2023-02-28 DIAGNOSIS — R7303 Prediabetes: Secondary | ICD-10-CM

## 2023-02-28 DIAGNOSIS — Z6829 Body mass index (BMI) 29.0-29.9, adult: Secondary | ICD-10-CM | POA: Diagnosis not present

## 2023-02-28 DIAGNOSIS — E669 Obesity, unspecified: Secondary | ICD-10-CM

## 2023-02-28 MED ORDER — LAMICTAL 100 MG PO TABS: 100.0000 mg | ORAL_TABLET | Freq: Two times a day (BID) | ORAL | 4 refills | Status: DC

## 2023-02-28 NOTE — Telephone Encounter (Signed)
Script has been refilled [facility]

## 2023-02-28 NOTE — Telephone Encounter (Signed)
Pt has called back to inform Casey,RN that her insurance is wanting the  LAMICTAL 100 MG tablet , to be sent to [facility]  [address] [place], [place] [address] Pt does not have ph or fax# to provide. She said the insurance approved [facility] but it is more cost effective to use [facility]

## 2023-02-28 NOTE — Addendum Note (Signed)
Addended by: Judi Cong on: 02/28/2023 11:35 AM   Modules accepted: Orders

## 2023-03-05 NOTE — Progress Notes (Signed)
Chief Complaint:   OBESITY Kim Kim is here to discuss her progress with her obesity treatment plan along with follow-up of her obesity related diagnoses. Kim Kim is on keeping a food journal and adhering to recommended goals of 1100-1200 calories and 75+ grams of protein and states she is following her eating plan approximately 0% of the time. [name] states she is doing 0 minutes 0 times per week.  Today's visit was #: 14 Starting weight: 166 lbs Starting date: 07/12/2021 Today's weight: 170 lbs Today's date: 02/28/2023 Total lbs lost to date: 0 Total lbs lost since last in-office visit: 0  Interim History: Kim Kim has been dealing with health issues recently and she has not been able to concentrate on following an eating plan.  She is working on getting back on track with her weight loss efforts.  She has some questions about collagen, sugar levels in fruit, and protein.  Subjective:   1. Prediabetes Kim Kim's last A1c was elevated at 6.0.  She has questions about starting metformin.  Assessment/Plan:   1. Prediabetes Kim Kim agreed to start metformin 500 mg every morning with no refills.  -metFORMIN (GLUCOPHAGE) 500 mg tablet; Take 1 tablet (500 mg total) by mouth daily with breakfast, Dispense: 30 Refill: 0  2. Obesity, Beginning BMI 30.0  3. Obesity, Current BMI 30.2 Kim Kim is currently in the action stage of change. As such, her goal is to continue with weight loss efforts. She has agreed to the Category 1 Plan.   All questions were answered.   Exercise goals: All adults should avoid inactivity. Some physical activity is better than none, and adults who participate in any amount of physical activity gain some health benefits.  Behavioral modification strategies: increasing lean protein intake.  Kim Kim has agreed to follow-up with our clinic in 4 weeks. She was informed of the importance of frequent follow-up visits to maximize her success with intensive lifestyle modifications for  her multiple health conditions.   Objective:   Blood pressure 108/72, pulse 66, temperature 97.7 F (36.5 C), height 5\' 4"  (1.626 m), weight 170 lb (77.1 kg), SpO2 98 %. Body mass index is 29.18 kg/m.  Lab Results  Component Value Date   CREATININE 0.98 11/14/2022   BUN 18 11/14/2022   NA 138 11/14/2022   K 4.6 11/14/2022   CL 104 11/14/2022   CO2 26 11/14/2022   Lab Results  Component Value Date   ALT 11 11/16/2022   AST 18 11/16/2022   ALKPHOS 95 11/16/2022   BILITOT 0.3 11/16/2022   Lab Results  Component Value Date   HGBA1C 6.0 11/16/2022   HGBA1C 5.7 07/14/2021   HGBA1C 6.1 09/06/2020   HGBA1C 5.8 01/07/2020   Lab Results  Component Value Date   INSULIN 9.5 07/12/2021   Lab Results  Component Value Date   TSH 2.32 11/28/2021   Lab Results  Component Value Date   CHOL 118 11/16/2022   HDL 50.10 11/16/2022   LDLCALC 46 11/16/2022   TRIG 113.0 11/16/2022   CHOLHDL 2 11/16/2022   Lab Results  Component Value Date   VD25OH 36.99 09/06/2020   VD25OH 34.95 01/07/2020   Lab Results  Component Value Date   WBC 7.3 11/14/2022   HGB 11.8 (L) 11/14/2022   HCT 36.5 11/14/2022   MCV 83.9 11/14/2022   PLT 195 11/14/2022   Lab Results  Component Value Date   IRON 38 (L) 11/16/2022   TIBC 369.6 11/16/2022   FERRITIN 21.8 11/16/2022  Attestation Statements:   Reviewed by clinician on day of visit: allergies, medications, problem list, medical history, surgical history, family history, social history, and previous encounter notes.  Time spent on visit including pre-visit chart review and post-visit care and charting was 30 minutes.   I, Trixie Dredge, am acting as transcriptionist for Dennard Nip, MD.  I have reviewed the above documentation for accuracy and completeness, and I agree with the above. -  Dennard Nip, MD

## 2023-03-06 ENCOUNTER — Ambulatory Visit: Payer: Medicare Other | Admitting: Neurology

## 2023-03-07 ENCOUNTER — Ambulatory Visit (INDEPENDENT_AMBULATORY_CARE_PROVIDER_SITE_OTHER): Payer: Self-pay | Admitting: Family Medicine

## 2023-03-07 ENCOUNTER — Ambulatory Visit (INDEPENDENT_AMBULATORY_CARE_PROVIDER_SITE_OTHER): Payer: Medicare Other | Admitting: Family Medicine

## 2023-03-07 DIAGNOSIS — M79671 Pain in right foot: Secondary | ICD-10-CM

## 2023-03-07 NOTE — Progress Notes (Signed)
Patient returns for third shockwave treatment for right plantar fasciitis. She reports doing much better with this though corn on heel is still causing some discomfort.  Procedure: ECSWT Indications:  right plantar fasciitis   Procedure Details Consent: Risks of procedure as well as the alternatives and risks of each were explained to the patient.  Written consent for procedure obtained. Time Out: Verified patient identification, verified procedure, site was marked, verified correct patient position, medications/allergies/relevent history reviewed.  The area was cleaned with alcohol swab.     The right plantar fascia was targeted for Extracorporeal shockwave therapy.    Preset: plantar fasciitis Power Level: 120 Frequency: 12 Impulse/cycles: 2500 Head size: large   Patient tolerated procedure well without immediate complications Follow up in 1 week for fourth and final treatment.

## 2023-03-12 ENCOUNTER — Ambulatory Visit: Payer: Medicare Other | Admitting: Family Medicine

## 2023-03-12 MED ORDER — METFORMIN HCL 500 MG PO TABS
500.0000 mg | ORAL_TABLET | Freq: Every day | ORAL | 0 refills | Status: DC
Start: 2023-03-12 — End: 2023-06-12

## 2023-03-14 ENCOUNTER — Encounter: Payer: Self-pay | Admitting: Family Medicine

## 2023-03-14 ENCOUNTER — Ambulatory Visit: Payer: Self-pay | Admitting: Family Medicine

## 2023-03-14 ENCOUNTER — Ambulatory Visit (INDEPENDENT_AMBULATORY_CARE_PROVIDER_SITE_OTHER): Payer: Medicare Other | Admitting: Family Medicine

## 2023-03-14 VITALS — BP 124/66 | HR 83 | Ht 64.0 in | Wt 178.4 lb

## 2023-03-14 DIAGNOSIS — R7303 Prediabetes: Secondary | ICD-10-CM | POA: Diagnosis not present

## 2023-03-14 DIAGNOSIS — G40109 Localization-related (focal) (partial) symptomatic epilepsy and epileptic syndromes with simple partial seizures, not intractable, without status epilepticus: Secondary | ICD-10-CM | POA: Diagnosis not present

## 2023-03-14 DIAGNOSIS — Z0000 Encounter for general adult medical examination without abnormal findings: Secondary | ICD-10-CM

## 2023-03-14 DIAGNOSIS — E038 Other specified hypothyroidism: Secondary | ICD-10-CM | POA: Diagnosis not present

## 2023-03-14 DIAGNOSIS — M810 Age-related osteoporosis without current pathological fracture: Secondary | ICD-10-CM | POA: Diagnosis not present

## 2023-03-14 DIAGNOSIS — E063 Autoimmune thyroiditis: Secondary | ICD-10-CM

## 2023-03-14 DIAGNOSIS — K219 Gastro-esophageal reflux disease without esophagitis: Secondary | ICD-10-CM

## 2023-03-14 MED ORDER — LAMICTAL 100 MG PO TABS: 100.0000 mg | ORAL_TABLET | Freq: Two times a day (BID) | ORAL | 4 refills | Status: DC

## 2023-03-14 NOTE — Assessment & Plan Note (Signed)
She will continue to be followed by neurology we will prescribe her Lamictal.

## 2023-03-14 NOTE — Assessment & Plan Note (Signed)
Discussed proton pump inhibitors and osteoporosis.  She does not currently want to consider any medication for osteoporosis.

## 2023-03-14 NOTE — Assessment & Plan Note (Signed)
She is up-to-date on everything except her tetanus shot and her Pneumovax.  She can receive tetanus shot at any pharmacy.  She would like to defer Pneumovax to another time for consideration.

## 2023-03-14 NOTE — Assessment & Plan Note (Signed)
Recent EGD.  Her symptoms of reflux were chest pain/heartburn with exertion.  We talked about ways to see if she still needs to continue Prilosec.  1 would be to stop or taper down.  Alternatively she could add Pepcid and then stop her alternate Pepcid and Prilosec.  She is a little concerned about Prilosec causing bone loss.

## 2023-03-14 NOTE — Patient Instructions (Signed)
Great to meet you! See you in three months unless something comes up.

## 2023-03-14 NOTE — Assessment & Plan Note (Signed)
Discussed.  She is following up pretty reasonable diet.  She is exercising regularly.  I would not recommend rechecking A1c until another 3 months.  I do not think she needs to do anything different currently.  She has been taking metformin, mostly daily.  Will not make any changes for now and follow-up 3 months.

## 2023-03-14 NOTE — Progress Notes (Signed)
    CHIEF COMPLAINT / HPI:  New patient here to establish care 1.  Review of her past medical history including episodes of fatigue that she had for 3 months after an infectious respiratory infection, likely RSV.  She is feeling better now. 2.  Recently saw cardiologist, Dr. Gwynneth Macleod.  Had complete workup. 3.  Has recently had EGD.  She has been on Prilosec and wonders if she can discontinue that and how best to do that. 4.  Follows with neurology.  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 #5.  Has concerns about blood sugar.  She has been diagnosed with prediabetes.  Wonders what her A1c level should be and whether she should do anything specific to get it to a lower level.   PERTINENT  PMH / PSH: I have reviewed the patient's medications, allergies, past medical and surgical history, smoking status and updated in the EMR as appropriate.   OBJECTIVE:  BP 124/66   Pulse 83   Ht 5\' 4"  (1.626 m)   Wt 178 lb 6.4 oz (80.9 kg)   SpO2 99%   BMI 30.62 kg/m   Vital signs reviewed. GENERAL: Well-developed, well-nourished, no acute distress. CARDIOVASCULAR: Regular rate and rhythm no murmur gallop or rub LUNGS: Clear to auscultation bilaterally, no rales or wheeze. ABDOMEN: Soft positive bowel sounds PSYCH: AxOx4. Good eye contact.. No psychomotor retardation or agitation. Appropriate speech fluency and content. Asks and answers questions appropriately. Mood is congruent. MSK: Movement of extremity x 4.  ASSESSMENT / PLAN:   Prediabetes Discussed.  She is following up pretty reasonable diet.  She is exercising regularly.  I would not recommend rechecking A1c until another 3 months.  I do not think she needs to do anything different currently.  She has been taking metformin, mostly daily.  Will not make any changes for now and follow-up 3 months.  GERD (gastroesophageal reflux disease) Recent EGD.  Her symptoms of reflux were  chest pain/heartburn with exertion.  We talked about ways to see if she still needs to continue Prilosec.  1 would be to stop or taper down.  Alternatively she could add Pepcid and then stop her alternate Pepcid and Prilosec.  She is a little concerned about Prilosec causing bone loss.  Osteoporosis Discussed proton pump inhibitors and osteoporosis.  She does not currently want to consider any medication for osteoporosis.  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HCC) She will continue to be followed by neurology we will prescribe her Lamictal.  Healthcare maintenance She is up-to-date on everything except her tetanus shot and her Pneumovax.  She can receive tetanus shot at any pharmacy.  She would like to defer Pneumovax to another time for consideration.   Denny Levy MD

## 2023-03-18 ENCOUNTER — Ambulatory Visit (INDEPENDENT_AMBULATORY_CARE_PROVIDER_SITE_OTHER): Payer: Medicare Other | Admitting: Neurology

## 2023-03-18 DIAGNOSIS — R4689 Other symptoms and signs involving appearance and behavior: Secondary | ICD-10-CM

## 2023-03-18 DIAGNOSIS — Z87730 Personal history of (corrected) cleft lip and palate: Secondary | ICD-10-CM

## 2023-03-18 DIAGNOSIS — G4730 Sleep apnea, unspecified: Secondary | ICD-10-CM | POA: Diagnosis not present

## 2023-03-18 DIAGNOSIS — G40109 Localization-related (focal) (partial) symptomatic epilepsy and epileptic syndromes with simple partial seizures, not intractable, without status epilepticus: Secondary | ICD-10-CM

## 2023-03-21 ENCOUNTER — Encounter: Payer: Self-pay | Admitting: Internal Medicine

## 2023-03-21 ENCOUNTER — Ambulatory Visit (INDEPENDENT_AMBULATORY_CARE_PROVIDER_SITE_OTHER): Payer: Medicare Other | Admitting: Internal Medicine

## 2023-03-21 ENCOUNTER — Ambulatory Visit (INDEPENDENT_AMBULATORY_CARE_PROVIDER_SITE_OTHER): Payer: Self-pay | Admitting: Family Medicine

## 2023-03-21 VITALS — BP 118/74 | HR 69 | Ht 64.0 in | Wt 176.0 lb

## 2023-03-21 VITALS — Ht 64.0 in

## 2023-03-21 DIAGNOSIS — E063 Autoimmune thyroiditis: Secondary | ICD-10-CM | POA: Diagnosis not present

## 2023-03-21 DIAGNOSIS — R7303 Prediabetes: Secondary | ICD-10-CM

## 2023-03-21 DIAGNOSIS — M859 Disorder of bone density and structure, unspecified: Secondary | ICD-10-CM

## 2023-03-21 DIAGNOSIS — E0863 Diabetes mellitus due to underlying condition with oral complications: Secondary | ICD-10-CM | POA: Diagnosis not present

## 2023-03-21 DIAGNOSIS — M722 Plantar fascial fibromatosis: Secondary | ICD-10-CM

## 2023-03-21 DIAGNOSIS — E559 Vitamin D deficiency, unspecified: Secondary | ICD-10-CM

## 2023-03-21 MED ORDER — SYNTHROID 50 MCG PO TABS: 50.0000 ug | ORAL_TABLET | ORAL | Status: DC

## 2023-03-21 MED ORDER — AMBULATORY NON FORMULARY MEDICATION
0 refills | Status: DC
Start: 2023-03-21 — End: 2023-12-05

## 2023-03-21 NOTE — Progress Notes (Signed)
Apnea was identified with hypoxia, but no PLMs were recorded, no REM BD was evident. Recommending PAP therapy as described.

## 2023-03-21 NOTE — Progress Notes (Signed)
Patient returns for her fourth shockwave treatment for right plantar fasciitis.  She is doing much better.  Procedure: ECSWT Indications:  right plantar fasciitis   Procedure Details Consent: Risks of procedure as well as the alternatives and risks of each were explained to the patient.  Written consent for procedure obtained. Time Out: Verified patient identification, verified procedure, site was marked, verified correct patient position, medications/allergies/relevent history reviewed.  The area was cleaned with alcohol swab.     The right plantar fascia was targeted for Extracorporeal shockwave therapy.    Preset: plantar fasciitis Power Level: 120 Frequency: 13 Impulse/cycles: 2500 Head size: large   Patient tolerated procedure well without immediate complications

## 2023-03-21 NOTE — Procedures (Signed)
[facility] POLYSOMNOGRAPHY  INTERPRETATION REPORT   STUDY DATE:  03/18/2023     PATIENT NAME:  Kim Kim         DATE OF BIRTH:  1956-08-11  PATIENT ID:  [id number]    TYPE OF STUDY:  PSG  INTERPRETING  PHYSICIAN: Melvyn Novas, MD  Referring Provider: Dr Yetta Barre, MD [place] Primary Neurologist : Dr. Marjory Lies, MD     SCORING TECHNICIAN: Margaretann Loveless, RPSGT   HISTORY:   Kim Kim is a 67 y.o. female patient who is here on  02/12/2023 as she needed a new order for a sleep study and had let the previous order expire. 02-12-2023:"visit due to switching to Medicare. Needed updated follow up visit to document the need for a SS and reorder study. "We just have been between having colds and viral illnesses, and I finally came around to do the sleep study". She has had reportedly abnormal EEGs in [place], at [facility]. Has seen Dr. Marjory Lies 12-19-2022".  ADDITIONAL INFORMATION:  The Epworth Sleepiness Scale endorsed at 2 /24 points (scores above or equal to 10 are suggestive of hypersomnolence). Height: 64 in Weight: 170 lbs (BMI 29) Neck Size: 14 in  MEDICATIONS: Desowen, Colace, Ferrous Sulfate, Nizoral, Lamictal, Prilosec, Crestor, Synthroid TECHNICAL DESCRIPTION: A registered sleep technologist ( RPSGT)  was in attendance for the duration of the recording.  Data collection, scoring, video monitoring, and reporting were performed in compliance with the AASM Manual for the Scoring of Sleep and Associated Events; (Hypopnea is scored based on the criteria listed in Section VIII D. 1b in the AASM Manual V2.6 using a 4% oxygen desaturation rule or Hypopnea is scored based on the criteria listed in Section VIII D. 1a in the AASM Manual V2.6 using 3% oxygen desaturation and /or arousal rule).   SLEEP CONTINUITY AND SLEEP ARCHITECTURE:  Lights-out was at 21:58: and lights-on at  05:04:, with  7.1 minutes of recording time . Total sleep time ( TST) was 217.0 minutes  with a decreased sleep efficiency at 50.9%.  Sleep latency was increased at 75.5 minutes.  REM sleep latency was increased at 234.0 minutes. Of the total sleep time, the percentage of stage N1 sleep was 5.5%, stage N2 sleep was 68%, stage N3 sleep was 2.3%, and REM sleep was 23.7%. BODY POSITION:  TST was divided  between the following sleep positions: In supine 196 minutes (90%), non-supine 21 minutes (10%); right 21 minutes (10%), left 00 minutes (0%), and prone 00 minutes (0%).  Total supine REM sleep time was 51 minutes (100% of total REM sleep). There were 1 Stage R periods observed on this study night, 14 awakenings (i.e. transitions to Stage W from any sleep stage), and 42 total stage transitions. Wake after sleep onset (WASO) time accounted for 134 minutes (!).   RESPIRATORY MONITORING:  Based on CMS criteria (using a 4% oxygen desaturation rule for scoring hypopneas), there were 52 apneas (52 obstructive; 0 central; 0 mixed), and 34 hypopneas.   Apnea index was 14.4/h. Hypopnea index was 9.4/h. AHI : The apnea-hypopnea index was 23.8/h overall (25.7 supine, 0 non-supine; 54.8 REM, 54.8 supine REM). There were 0 respiratory effort-related arousals (RERAs).   OXIMETRY: Oxyhemoglobin Saturation Nadir during sleep was at  54% from a mean of 90%.  Of the Total sleep time (TST)  hypoxemia (<89%) was present for  37.6 minutes, or 17.3% of total sleep time.  LIMB MOVEMENTS: There were 0 periodic limb movements of sleep .  AROUSAL: There were 78 arousals in total, for an arousal index of 20 arousals/hour.  Of these, 36 were identified as respiratory-related arousals (10 /h), 0 were PLM-related arousals (0 /h), and 42 were non-specific arousals (12 /h).There were 0 occurrences of Cheyne Stokes breathing.   EEG:  PSG EEG was of normal amplitude and frequency, with symmetric manifestation of sleep stages. EKG: The electrocardiogram documented regular rhythm  .  The average heart rate during sleep was 73  bpm.  The heart rate during sleep varied between a minimum of 64 bpm and  a maximum of  101 bpm.  IMPRESSION: 1) Sleep disordered breathing was present and reached an AHI of 23.8/h and was associated with hypoxia.   2) Periodic limb movements were not present.  3) Insomnia:  Sleep efficiency was low .Total sleep time was reduced at 217.0 minutes.  Sleep efficiency was decreased at 50.9%.    Sleep fragmentation was noted- The majority of sleep arousals was related to non- specific or respiratory arousals.   RECOMMENDATIONS:  This degree of apnea is moderate yet the association with hypoxia makes treatment more urgent. Hypoxia is best addressed by Positive Airway Therapy and an auto CPAP device is now ordered, setting between 6-16 cm water, 3 cm EPR, mask to be fitted.  ONO to be performed on CPAP in the third months of therapy before RV at [facility].   Melvyn Novas, MD

## 2023-03-21 NOTE — Addendum Note (Signed)
Addended by: Melvyn Novas on: 03/21/2023 07:54 PM   Modules accepted: Orders

## 2023-03-21 NOTE — Patient Instructions (Signed)

## 2023-03-21 NOTE — Progress Notes (Unsigned)
Name: Kim Kim  MRN/ DOB: [id number], 10/06/1956    Age/ Sex: 67 y.o., female    PCP: Nestor Ramp, MD   Reason for Endocrinology Evaluation:      Date of Initial Endocrinology Evaluation: 03/21/2023     HPI: Kim Kim is a 67 y.o. female with a past medical history of hypothyroidism and hiatal hernia. The patient presented for initial endocrinology clinic visit on 03/21/2023 for consultative assistance with her hypothyroidism.    She has been  diagnosed with hypothyroidism secondary to Hashimoto's thyroiditis since her 77's. She has been on LT-4 replacement ever since her diagnosis  She also has been diagnosed with multiple thyroid nodules based on a thyroid ultrasound from 2009.  She had an FNA of the 3 cm right nodule and 1 cm left nodule, the results are not available.  But repeat ultrasound in 2012 showed stability    Osteopenia history: She has been diagnosed with osteopenia in 2008.  She was prescribed alendronate briefly 2020-2021. Her last DEXA in 03/2022 showed slight decline in BMD at the spine and the hips    Prediabetes: A1c has range from 5.7% to 6.1% between 2021 and 2024 Per endocrinology note in care everywhere she has been off metformin since April/2020.  Patient tells me she was on Glyxambi at some point. 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  With an IGF-1 of 74. She was on Nutropin for approximately a year, but this was discontinued and her symptoms have been stable.  MRI stable from 2016.     In reviewing care everywhere she has had labs 01/30/2023 indicating no food allergies,Normal IgE, elevated histamine A1c 5.9%, normal fasting insulin at 7u IU/mL, C-peptide 1.3 NG/mL  Levothyroxine 50 mcg , 1.5 tabs on Sundays and 1 tablet rest of the week She is not on calcium  Vitamin D drops once a week   She has been eating more cottage cheese recently    She  had recent sleep study, she does not snore but this was done at a base line  She follows with functional doctor in [place]  Denies falls Denies bone fractures  She follows with rheumatology  for OA  She does weight bearing exercise  Has chronic constipation  Denies palpitations  Denies tremors  Avoids sugar- sweetened beverages  She does not sleep well at night  Has low Libido at ties  Denies local neck swelling   She has 3 kids    HISTORY:  Past Medical History:  Past Medical History:  Diagnosis Date   Allergy    seasonal allergies   Anemia    hx of   Carotid atherosclerosis    Epilepsy (HCC)    Gastric ulcer    hx of   GERD (gastroesophageal reflux disease)    on meds   Growth hormone deficiency (HCC)    Hiatal hernia    Hypothyroidism    hx of Hashimoto thyroiditis   Pituitary adenoma (HCC)    hx of   Seizures (HCC)    temporal lobe   Thyroid disease    on meds   Vitamin D deficiency    Past Surgical History:  Past Surgical History:  Procedure Laterality Date   COLONOSCOPY  12/10/2015   [place] Dr Lysle Morales   ESOPHAGOGASTRODUODENOSCOPY  06/03/2019   Dr Saunders [place], [place]   WISDOM TOOTH EXTRACTION  Social History:  reports that she has never smoked. She has never been exposed to tobacco smoke. She has never used smokeless tobacco. She reports that she does not currently use alcohol. She reports that she does not use drugs. Family History: family history includes Diabetes in her mother and sister; Hearing loss in her sister, sister, and sister; Heart disease in her brother, father, and mother; Hyperlipidemia in her father; Hypertension in her father, mother, sister, sister, and sister; Thyroid disease in her mother; Thyroid nodules in her mother.   HOME MEDICATIONS: Allergies as of 03/21/2023   No Known Allergies      Medication List        Accurate as of Mar 21, 2023 12:46 PM. If you have any questions, ask your nurse or doctor.           desonide 0.05 % cream Commonly known as: DESOWEN Apply 1 Application topically as needed.   docusate sodium 100 MG capsule Commonly known as: COLACE Take 100 mg by mouth every other day.   Ferrous Sulfate 140 (45 Fe) MG Tbcr Take 1 tablet by mouth every morning.   ketoconazole 2 % cream Commonly known as: NIZORAL Apply 1 Application topically 2 (two) times daily. To affected areas.   LaMICtal 100 MG tablet Generic drug: lamoTRIgine Take 1 tablet (100 mg total) by mouth 2 (two) times daily.   metFORMIN 500 MG tablet Commonly known as: GLUCOPHAGE Take 1 tablet (500 mg total) by mouth daily with breakfast.   omeprazole 20 MG capsule Commonly known as: PRILOSEC Take 20 mg by mouth daily.   rosuvastatin 10 MG tablet Commonly known as: CRESTOR Take 10 mg by mouth at bedtime.   Synthroid 50 MCG tablet Generic drug: levothyroxine Take 50 mcg by mouth daily before breakfast. Taking 6 days a week          REVIEW OF SYSTEMS: A comprehensive ROS was conducted with the patient and is negative except as per HPI    OBJECTIVE:  VS: There were no vitals taken for this visit.   Wt Readings from Last 3 Encounters:  03/14/23 178 lb 6.4 oz (80.9 kg)  02/28/23 170 lb (77.1 kg)  02/19/23 165 lb (74.8 kg)     EXAM: General: Pt appears well and is in NAD  Neck: General: Supple without adenopathy. Thyroid: Thyroid size normal.  No goiter or nodules appreciated.  Lungs: Clear with good BS bilat   Heart: Auscultation: RRR.  Abdomen: Normoactive bowel sounds, soft, nontender, without masses or organomegaly palpable  Extremities:  BL LE: No pretibial edema normal ROM and strength.  Mental Status: Judgment, insight: Intact Orientation: Oriented to time, place, and person Mood and affect: No depression, anxiety, or agitation     DATA REVIEWED:     Latest Reference Range & Units 11/16/22 16:36  Alkaline Phosphatase 39 - 117 U/L 95  Albumin 3.5 - 5.2 g/dL 4.4  AST 0 - 37  U/L 18  ALT 0 - 35 U/L 11  Total Protein 6.0 - 8.3 g/dL 7.8  Bilirubin, Direct 0.0 - 0.3 mg/dL 0.1  Total Bilirubin 0.2 - 1.2 mg/dL 0.3  Total CHOL/HDL Ratio  2  Cholesterol 0 - 200 mg/dL 161  HDL Cholesterol >09.60 mg/dL 45.40  LDL (calc) 0 - 99 mg/dL 46  NonHDL  98.11  Triglycerides 0.0 - 149.0 mg/dL 914.7  VLDL 0.0 - 82.9 mg/dL 56.2    ASSESSMENT/PLAN/RECOMMENDATIONS:   Hypothyroidism secondary to Hashimoto's thyroiditis:  -Patient had symptoms of fatigue  while on levothyroxine 50 MCG daily, despite normal TFTs a few weeks ago, she did increase her levothyroxine to 1 and a1/2 tablets on Sunday and continued with 1 tablet the rest of the week, patient states that she felt immediately better the next day. -I did caution the patient against  hyperthyroidism due to excessive LT-for replacement intake, and its risk of worsening bone health and increasing the risk of cardiac arrhythmias. -I have asked her in the meantime to continue on the current dose of levothyroxine and we will check on the next visit -We also teased the idea of liothyronine if needed in the future   Medications : Levothyroxine 50 MCG daily except Sundays take 1.5 tabs  2. Osteopenia:  -Patient has been encouraged to take calcium citrate, as well as vitamin D daily. -She she tells me she had a prescription of Fosamax through her previous endocrinologist but she has not started that.  But in review of care everywhere records her endocrinologist had stated that she has been on Fosamax for approximately 6 years x 2020. -I have recommended to hold off on Fosamax at this time   3.  Weight gain:  -Patient is concerned about weight gain, she does admit to decreased at exercise at some point, but she has been using the Peloton lately.  Patient does state that she has been eating  "Clean" but I explained to the patient that it is very important to focus on the calorie content in the carbohydrate grade content of the foods  and not just the source. -I also have provided her with information to the  weight loss clinic with Dr. Quillian Quince  4.  Prediabetes:  -A1c 5.8% -Per care everywhere records she was on metformin until April 2020.  Previous endocrinologist. -Patient also stated she was on Glyxambi, but I cannot find a record of this -We discussed the literature results of lifestyle changes having a better impact than long-term Metformin use, and I would strongly recommend that she continues with lifestyle changes at this point.  5.  Pituitary microadenoma: -This was not addressed during her visit due to lack of time -Per records stable on MRI from 2016 -History of IGF 1 deficiency, was on treatment for approximately a year but no further treatment was offered to the patient due to stability.   Follow-up in 3 months   Signed electronically by: Lyndle Herrlich, MD  [facility]  [facility] [address] [place], [place] [address] Phone: [phone] FAX: [phone]   CC: Nestor Ramp, MD [address] [place] [address] Phone: [phone] Fax: [phone]   Return to Endocrinology clinic as below: Future Appointments  Date Time Provider Department Center  03/21/2023  1:00 PM Onesha Krebbs, Konrad Dolores, MD [facility] None  03/21/2023  2:45 PM Lenda Kelp, MD [facility]  04/03/2023 12:20 PM Wilder Glade, MD [facility] None  04/04/2023  9:40 AM Pollyann Savoy, MD [facility] None  04/24/2023 12:40 PM Wilder Glade, MD [facility] None  05/15/2023  1:30 PM Dohmeier, Porfirio Mylar, MD [facility] None  09/14/2023  8:50 AM Rosamond Andress, Konrad Dolores, MD [facility] None  10/15/2023  3:30 PM Penumalli, Glenford Bayley, MD [facility] None  02/04/2024  9:00 AM Yates Decamp, MD [facility] None

## 2023-03-22 ENCOUNTER — Telehealth: Payer: Self-pay

## 2023-03-22 NOTE — Telephone Encounter (Signed)
-----   Message from Melvyn Novas, MD sent at 03/21/2023  7:54 PM EDT ----- Apnea was identified with hypoxia, but no PLMs were recorded, no REM BD was evident. Recommending PAP therapy as described.

## 2023-03-22 NOTE — Telephone Encounter (Addendum)
I called pt. I advised pt that Dr. Vickey Huger reviewed their sleep study results and found that pt has moderate OSA. Dr. Vickey Huger recommends that pt starts autopap. I reviewed PAP compliance expectations with the pt. Pt is agreeable to starting a CPAP. I advised pt that an order will be sent to a DME, [facility], and [facility] will call the pt within about one week after they file with the pt's insurance. [facility] will show the pt how to use the machine, fit for masks, and troubleshoot the CPAP if needed. A follow up appt was made for insurance purposes with Dr. Vickey Huger on 06/12/23. Pt verbalized understanding to arrive 15 minutes early and bring their CPAP. Pt verbalized understanding of results. Pt had no questions at this time but was encouraged to call back if questions arise. I have sent the order to [facility] and have received confirmation that they have received the order.

## 2023-03-22 NOTE — Telephone Encounter (Addendum)
[facility] Zott, Stacy  Meztli Llanas, Abbe Amsterdam, CMA; Cornelia, Tammy Got It Thank you  Bobbye Morton, CMA  Zott, Kennyth Arnold; Gamble, Tammy New orders have been placed for the above pt, DOB: 2056-02-08 Thanks

## 2023-03-22 NOTE — Telephone Encounter (Signed)
This is a FYI, pt reports that her spouse uses [facility] and she would like to use the same DME.  Pt also reports that she personally has confirmed that [facility] accepts her insurance.  She has not requested a call back

## 2023-03-22 NOTE — Telephone Encounter (Addendum)
[facility] New, Maryruth Bun, Abbe Amsterdam, CMA; Ellis Parents Eula Fried; Marveen Reeks Noted. Thank you!  Bobbye Morton, CMA  Ellis Parents, Ivan Anchors, Lenon Curt, Efraim Kaufmann; Montez Morita, Jasmine PLEASE DISREGARD, PATIENT HAS CHANGED HER MIND!!       Previous Messages    ----- Message ----- From: Bobbye Morton, CMA Sent: 03/22/2023   9:38 AM EDT To: Penni Homans; Santina Evans; * Subject: new autopap                                    New orders have been placed for the above pt, DOB: Sep 04, 2056 Thanks

## 2023-03-27 NOTE — Progress Notes (Signed)
Office Visit Note  Patient: Kim Kim             Date of Birth: [birth year]           MRN: [id number]             PCP: Nestor Ramp, MD Referring: Etta Grandchild, MD Visit Date: 04/04/2023 Occupation: @GUAROCC @  Subjective:  Osteopenia and joint pain  History of Present Illness: Kim Kim is a 67 y.o. female with osteopenia.  She returns today after her initial visit on January 04, 2023.  She takes vitamin D on a regular basis.  She is not on calcium and she is not sure if she consumes calcium rich diet.  She had menarche at age 32 and menopause at age 68.  She is gravida 3 and para 3.  There is no personal or family history of fracture.  There is no family history of osteoporosis.  She has been on Synthroid since she was in her 25s.  She started taking omeprazole in her 5s.  Has history of gastric ulcer in the past while she was on NSAIDs after dental work.  She states she had endoscopy about 2 years ago which was negative for gastritis.  She has tried coming off omeprazole but she starts experiencing abdominal discomfort.  She exercises on a regular basis.  She goes to the gym 5 days a week and she participates in aerobics exercises, yoga, Pilates and weights.  Patient states she continues to have some discomfort in her bilateral hands.  She goes to the gym and lifts weights which makes the pain worse in her hands.    Activities of Daily Living:  Patient reports morning stiffness for 0 minutes.   Patient Denies nocturnal pain.  Difficulty dressing/grooming: Denies Difficulty climbing stairs: Denies Difficulty getting out of chair: Denies Difficulty using hands for taps, buttons, cutlery, and/or writing: Denies  Review of Systems  Constitutional:  Negative for fatigue.  HENT:  Negative for mouth sores and mouth dryness.   Eyes:  Negative for dryness.  Respiratory:  Negative for shortness of breath.   Cardiovascular:  Negative for chest pain and palpitations.  Gastrointestinal:   Negative for blood in stool, constipation and diarrhea.  Endocrine: Negative for increased urination.  Genitourinary:  Negative for involuntary urination.  Musculoskeletal:  Positive for joint pain, joint pain and joint swelling. Negative for gait problem, myalgias, muscle weakness, morning stiffness, muscle tenderness and myalgias.  Skin:  Negative for color change, rash, hair loss and sensitivity to sunlight.  Allergic/Immunologic: Negative for susceptible to infections.  Neurological:  Negative for dizziness and headaches.  Hematological:  Negative for swollen glands.  Psychiatric/Behavioral:  Negative for depressed mood and sleep disturbance. The patient is not nervous/anxious.     PMFS History:  Patient Active Problem List   Diagnosis Date Noted   OSA (obstructive sleep apnea) 04/04/2023   Obesity, Beginning BMI 30.0 04/04/2023   Hashimoto's thyroiditis 03/21/2023   Low bone density 03/21/2023   Pre-diabetes 03/21/2023   Healthcare maintenance 03/14/2023   History of cleft palate 02/12/2023   Sleep apnea 02/12/2023   BMI 29.0-29.9,adult 01/03/2023   Bacterial overgrowth syndrome 07/19/2021   Prediabetes 01/22/2020   Chronic idiopathic constipation 09/01/2019   Family history of heart disease 12/11/2018   Environmental and seasonal allergies 10/17/2018   B12 deficiency 10/16/2017   Osteoporosis 10/01/2017   Uterine fibroid 07/31/2017   Velopharyngeal insufficiency (VPI), 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   Iron deficiency 04/27/2016   Deviated  nasal septum 12/01/2015   Hypothyroidism 12/17/2012   Insulin resistance 12/17/2012   Auras 10/15/2012   Pituitary microadenoma (HCC) 01/05/2012   Pulmonary nodules 07/09/2011   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   GERD (gastroesophageal reflux disease) 01/01/2009   Vitamin D deficiency 10/07/2007    Past Medical History:  Diagnosis Date   Allergy    seasonal allergies   Anemia    hx of   Carotid atherosclerosis    Epilepsy  (HCC)    Gastric ulcer    hx of   GERD (gastroesophageal reflux disease)    on meds   Growth hormone deficiency (HCC)    Hiatal hernia    Hypothyroidism    hx of Hashimoto thyroiditis   Pituitary adenoma (HCC)    hx of   Seizures (HCC)    temporal lobe   Thyroid disease    on meds   Vitamin D deficiency     Family History  Problem Relation Age of Onset   Hypertension Mother    Heart disease Mother    Diabetes Mother    Thyroid disease Mother        hypo   Thyroid nodules Mother    Hyperlipidemia Father    Heart disease Father    Hypertension Father    Hypertension Sister    Hearing loss Sister    Hypertension Sister    Hearing loss Sister    Diabetes Sister    Hypertension Sister    Hearing loss Sister    Heart disease Brother    Esophageal cancer Neg Hx    Colon cancer Neg Hx    Colon polyps Neg Hx    Stomach cancer Neg Hx    Rectal cancer Neg Hx    Past Surgical History:  Procedure Laterality Date   COLONOSCOPY  12/10/2015   [place] Dr Lysle Morales   ESOPHAGOGASTRODUODENOSCOPY  06/03/2019   Dr Saunders [place], [place]   WISDOM TOOTH EXTRACTION     Social History   Social History Narrative   She recently moved from [place]. 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   Caffeine- coffee maybe 1 c, some tea   Immunization History  Administered Date(s) Administered   DTaP 10/06/2009   H1N1 11/16/2008   Hepatitis A, Adult 12/05/2016, 08/09/2017   Influenza Inj Mdck Quad Pf 08/13/2017   Influenza Split 08/31/2011, 08/22/2012   Influenza, Quadrivalent, Recombinant, Inj, Pf 08/01/2018   Influenza, Seasonal, Injecte, Preservative Fre 09/04/2013   Influenza,inj,Quad PF,6+ Mos 06/26/2019, 08/01/2022   Influenza,inj,quad, With Preservative 08/10/2014, 07/13/2015, 07/11/2016, 08/07/2017   Influenza-Unspecified 10/09/2008, 08/15/2010, 08/10/2014, 07/13/2015, 07/11/2016, 08/05/2020, 08/24/2021   PFIZER Comirnaty(Gray Top)Covid-19 Tri-Sucrose  Vaccine 08/14/2022   PFIZER(Purple Top)SARS-COV-2 Vaccination 01/03/2020, 01/24/2020, 08/14/2020   PPD Test 05/13/2014, 05/20/2014, 06/28/2015, 07/13/2015, 07/11/2016, 08/07/2017   Pfizer Covid-19 Vaccine Bivalent Booster 39yrs & up 08/01/2021   Tdap 10/06/2009   Zoster Recombinat (Shingrix) 08/13/2017, 06/09/2018     Objective: Vital Signs: BP 131/87 (BP Location: Left Arm, Patient Position: Sitting, Cuff Size: Normal)   Pulse 73   Resp 16   Ht 5\' 4"  (1.626 m)   Wt 176 lb (79.8 kg)   BMI 30.21 kg/m    Physical Exam Vitals and nursing note reviewed.  Constitutional:      Appearance: She is well-developed.  HENT:     Head: Normocephalic and atraumatic.  Eyes:     Conjunctiva/sclera: Conjunctivae normal.  Cardiovascular:     Rate and Rhythm:  Normal rate and regular rhythm.     Heart sounds: Normal heart sounds.  Pulmonary:     Effort: Pulmonary effort is normal.     Breath sounds: Normal breath sounds.  Abdominal:     General: Bowel sounds are normal.     Palpations: Abdomen is soft.  Musculoskeletal:     Cervical back: Normal range of motion.  Lymphadenopathy:     Cervical: No cervical adenopathy.  Skin:    General: Skin is warm and dry.     Capillary Refill: Capillary refill takes less than 2 seconds.  Neurological:     Mental Status: She is alert and oriented to person, place, and time.  Psychiatric:        Behavior: Behavior normal.      Musculoskeletal Exam: Cervical, thoracic and lumbar spine were in good range of motion.  She had no tenderness over thoracic or lumbar spine.  Shoulder joints, elbow joints, wrist joints with good range of motion.  She had bilateral PIP and DIP thickening.  She has inflammation in her PIP joints.  Hip joints and knee joints were in good range of motion.  There was no tenderness over ankles or MTPs.  CDAI Exam: CDAI Score: -- Patient Global: --; Provider Global: -- Swollen: --; Tender: -- Joint Exam 04/04/2023   No joint exam  has been documented for this visit   There is currently no information documented on the homunculus. Go to the Rheumatology activity and complete the homunculus joint exam.  Investigation: No additional findings.  Imaging: Nocturnal polysomnography with REM behavior disorder  Result Date: 03/18/2023 Melvyn Novas, MD     03/21/2023  7:51 PM [facility] POLYSOMNOGRAPHY  INTERPRETATION REPORT STUDY DATE:  03/18/2023  PATIENT NAME:  Catha Brow        DATE OF BIRTH:  1955/11/14 PATIENT ID:  [id number]    TYPE OF STUDY:  PSG INTERPRETING  PHYSICIAN: Melvyn Novas, MD Referring Provider: Dr Yetta Barre, MD [facility] Primary Neurologist : Dr. Marjory Lies, MD   SCORING TECHNICIAN: Margaretann Loveless, RPSGT HISTORY:   Dejonae Stuedemann is a 67 y.o. female patient who is here on  02/12/2023 as she needed a new order for a sleep study and had let the previous order expire. 02-12-2023:"visit due to switching to Medicare. Needed updated follow up visit to document the need for a SS and reorder study. "We just have been between having colds and viral illnesses, and I finally came around to do the sleep study". She has had reportedly abnormal EEGs in [place], at [facility]. Has seen Dr. Marjory Lies 12-19-2022". ADDITIONAL INFORMATION:  The Epworth Sleepiness Scale endorsed at 2 /24 points (scores above or equal to 10 are suggestive of hypersomnolence). Height: 64 in Weight: 170 lbs (BMI 29) Neck Size: 14 in MEDICATIONS: Desowen, Colace, Ferrous Sulfate, Nizoral, Lamictal, Prilosec, Crestor, Synthroid TECHNICAL DESCRIPTION: A registered sleep technologist ( RPSGT)  was in attendance for the duration of the recording.  Data collection, scoring, video monitoring, and reporting were performed in compliance with the AASM Manual for the Scoring of Sleep and Associated Events; (Hypopnea is scored based on the criteria listed in Section VIII D. 1b in the AASM Manual V2.6 using a 4% oxygen desaturation rule or  Hypopnea is scored based on the criteria listed in Section VIII D. 1a in the AASM Manual V2.6 using 3% oxygen desaturation and /or arousal rule). SLEEP CONTINUITY AND SLEEP ARCHITECTURE:  Lights-out was at 21:58: and lights-on at  05:04:,  with  7.1 minutes of recording time . Total sleep time ( TST) was 217.0 minutes with a decreased sleep efficiency at 50.9%.  Sleep latency was increased at 75.5 minutes.  REM sleep latency was increased at 234.0 minutes. Of the total sleep time, the percentage of stage N1 sleep was 5.5%, stage N2 sleep was 68%, stage N3 sleep was 2.3%, and REM sleep was 23.7%. BODY POSITION:  TST was divided  between the following sleep positions: In supine 196 minutes (90%), non-supine 21 minutes (10%); right 21 minutes (10%), left 00 minutes (0%), and prone 00 minutes (0%). Total supine REM sleep time was 51 minutes (100% of total REM sleep). There were 1 Stage R periods observed on this study night, 14 awakenings (i.e. transitions to Stage W from any sleep stage), and 42 total stage transitions. Wake after sleep onset (WASO) time accounted for 134 minutes (!). RESPIRATORY MONITORING: Based on CMS criteria (using a 4% oxygen desaturation rule for scoring hypopneas), there were 52 apneas (52 obstructive; 0 central; 0 mixed), and 34 hypopneas.  Apnea index was 14.4/h. Hypopnea index was 9.4/h. AHI : The apnea-hypopnea index was 23.8/h overall (25.7 supine, 0 non-supine; 54.8 REM, 54.8 supine REM). There were 0 respiratory effort-related arousals (RERAs).  OXIMETRY: Oxyhemoglobin Saturation Nadir during sleep was at  54% from a mean of 90%.  Of the Total sleep time (TST)  hypoxemia (<89%) was present for  37.6 minutes, or 17.3% of total sleep time. LIMB MOVEMENTS: There were 0 periodic limb movements of sleep . AROUSAL: There were 78 arousals in total, for an arousal index of 20 arousals/hour.  Of these, 36 were identified as respiratory-related arousals (10 /h), 0 were PLM-related arousals (0 /h),  and 42 were non-specific arousals (12 /h).There were 0 occurrences of Cheyne Stokes breathing.  EEG:  PSG EEG was of normal amplitude and frequency, with symmetric manifestation of sleep stages. EKG: The electrocardiogram documented regular rhythm  .  The average heart rate during sleep was 73 bpm.  The heart rate during sleep varied between a minimum of 64 bpm and  a maximum of  101 bpm. IMPRESSION: 1) Sleep disordered breathing was present and reached an AHI of 23.8/h and was associated with hypoxia.  2) Periodic limb movements were not present. 3) Insomnia:  Sleep efficiency was low .Total sleep time was reduced at 217.0 minutes.  Sleep efficiency was decreased at 50.9%.    Sleep fragmentation was noted- The majority of sleep arousals was related to non- specific or respiratory arousals. RECOMMENDATIONS:  This degree of apnea is moderate yet the association with hypoxia makes treatment more urgent. Hypoxia is best addressed by Positive Airway Therapy and an auto CPAP device is now ordered, setting between 6-16 cm water, 3 cm EPR, mask to be fitted. ONO to be performed on CPAP in the third months of therapy before RV at [facility]. Melvyn Novas, MD      Recent Labs: Lab Results  Component Value Date   WBC 7.3 11/14/2022   HGB 11.8 (L) 11/14/2022   PLT 195 11/14/2022   NA 138 11/14/2022   K 4.6 11/14/2022   CL 104 11/14/2022   CO2 26 11/14/2022   GLUCOSE 96 11/14/2022   BUN 18 11/14/2022   CREATININE 0.98 11/14/2022   BILITOT 0.3 11/16/2022   ALKPHOS 95 11/16/2022   AST 18 11/16/2022   ALT 11 11/16/2022   PROT 7.8 11/16/2022   ALBUMIN 4.4 11/16/2022   CALCIUM 9.6 11/14/2022   April 27, 2022 vitamin D 62, TSH normal  Speciality Comments: No specialty comments available.  Procedures:  No procedures performed Allergies: Patient has no known allergies.   Assessment / Plan:     Visit Diagnoses: Osteopenia of multiple sites - 04/27/2022: RFN BMD 0.707 with T-score -2.4.  1-13/8% change from  baseline.  +1.0% change from previous scan on 09/05/19.  LFN BMD 0.712 T-score -2.3, change from baseline: -15/8% and change from previous -4.2%.  I did detailed discussion with the patient regarding her DEXA scan.  She had significant change from her baseline DEXA scan which was done in 2008 although she did not have any significant change when compared to the DEXA scan in 2020.  She consumes calcium rich diet and takes vitamin D.  Her vitamin D was in the desirable range last year.  She also exercises on a regular basis.  We had a detailed discussion regarding posterior pia and osteoporosis.  Different treatment options were discussed.  Since she has history of gastroesophageal reflux and history of gastric ulcer in the past show oral bisphosphonates will not be a good choice.  She may benefit from IV Reclast in the future.  I gave her the option of exercising on a regular basis and repeating DEXA scan after June 2025.  Patient was in agreement.  If she does not have any significant change she can continue current practices.  If she has significant change after the next DEXA scan then we may consider IV Reclast.  Indications side effects contraindications of IV prolapse were discussed at length.  I also advised her to get some baseline labs.  Patient states she will get the labs in [place] and send it to [place].- Plan: COMPLETE METABOLIC PANEL WITH GFR, Parathyroid hormone, intact (no Ca), VITAMIN D 25 Hydroxy (Vit-D Deficiency, Fractures), Phosphorus, Serum protein electrophoresis with reflex  Vitamin D deficiency-she has history of vitamin D deficiency in the past.  Her last vitamin D was 62 on April 27, 2022.  She has been taking vitamin D on a regular basis.  Primary osteoarthritis of right hand-she had bilateral PIP and DIP thickening.  She had inflammatory changes in her PIP joints of her right hand.  She states the discomfort gets worse after she lifts weights.  She was advised to use padded gloves.  Joint  protection muscle strengthening was discussed.  A handout on hand exercises was given.  Pulmonary nodules  Chronic idiopathic constipation  Bacterial overgrowth syndrome  History of gastroesophageal reflux (GERD) - per patient she had endoscopy 2 years ago which was negative for gastritis.  Patient states she is taking omeprazole since 2020.  She has tried coming off omeprazole and she gets epigastric pain.  History of gastric ulcer -she developed gastric ulcer on NSAIDS after dental work few years back.  Insulin resistance  Pituitary microadenoma (HCC)  Hashimoto's thyroiditis -will check her TSH again.  Plan: TSH  Growth hormone deficiency (HCC)-patient is followed by endocrinologist.  Velopharyngeal insufficiency (VPI), congenital  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HCC)  Dysmetabolic syndrome X  Intrinsic eczema  Prediabetes  Iron deficiency  B12 deficiency  Basal metabolism function study abnormality  Orders: Orders Placed This Encounter  Procedures   COMPLETE METABOLIC PANEL WITH GFR   Parathyroid hormone, intact (no Ca)   VITAMIN D 25 Hydroxy (Vit-D Deficiency, Fractures)   Phosphorus   TSH   Serum protein electrophoresis with reflex   No orders of the defined types were placed in this encounter.   Face-to-face  time spent with patient was 32 minutes. Greater than 50% of time was spent in counseling and coordination of care.  Follow-Up Instructions: Return in about 1 year (around 04/03/2024) for osteopenia, Osteoarthritis.   Pollyann Savoy, MD  Note - This record has been created using Animal nutritionist.  Chart creation errors have been sought, but may not always  have been located. Such creation errors do not reflect on  the standard of medical care.

## 2023-04-03 ENCOUNTER — Ambulatory Visit (INDEPENDENT_AMBULATORY_CARE_PROVIDER_SITE_OTHER): Payer: Medicare Other | Admitting: Family Medicine

## 2023-04-03 ENCOUNTER — Encounter (INDEPENDENT_AMBULATORY_CARE_PROVIDER_SITE_OTHER): Payer: Self-pay | Admitting: Family Medicine

## 2023-04-03 VITALS — BP 121/80 | HR 65 | Temp 97.6°F | Ht 64.0 in | Wt 173.0 lb

## 2023-04-03 DIAGNOSIS — G4733 Obstructive sleep apnea (adult) (pediatric): Secondary | ICD-10-CM | POA: Diagnosis not present

## 2023-04-03 DIAGNOSIS — Z6829 Body mass index (BMI) 29.0-29.9, adult: Secondary | ICD-10-CM

## 2023-04-03 DIAGNOSIS — E669 Obesity, unspecified: Secondary | ICD-10-CM | POA: Diagnosis not present

## 2023-04-04 ENCOUNTER — Encounter: Payer: Self-pay | Admitting: Rheumatology

## 2023-04-04 ENCOUNTER — Ambulatory Visit: Payer: Medicare Other | Attending: Physician Assistant | Admitting: Rheumatology

## 2023-04-04 VITALS — BP 131/87 | HR 73 | Resp 16 | Ht 64.0 in | Wt 176.0 lb

## 2023-04-04 DIAGNOSIS — Q388 Other congenital malformations of pharynx: Secondary | ICD-10-CM

## 2023-04-04 DIAGNOSIS — E559 Vitamin D deficiency, unspecified: Secondary | ICD-10-CM

## 2023-04-04 DIAGNOSIS — E88819 Insulin resistance, unspecified: Secondary | ICD-10-CM | POA: Diagnosis not present

## 2023-04-04 DIAGNOSIS — R948 Abnormal results of function studies of other organs and systems: Secondary | ICD-10-CM

## 2023-04-04 DIAGNOSIS — K6389 Other specified diseases of intestine: Secondary | ICD-10-CM | POA: Diagnosis not present

## 2023-04-04 DIAGNOSIS — R7303 Prediabetes: Secondary | ICD-10-CM

## 2023-04-04 DIAGNOSIS — D352 Benign neoplasm of pituitary gland: Secondary | ICD-10-CM

## 2023-04-04 DIAGNOSIS — R918 Other nonspecific abnormal finding of lung field: Secondary | ICD-10-CM | POA: Diagnosis present

## 2023-04-04 DIAGNOSIS — M19041 Primary osteoarthritis, right hand: Secondary | ICD-10-CM | POA: Diagnosis not present

## 2023-04-04 DIAGNOSIS — Z8711 Personal history of peptic ulcer disease: Secondary | ICD-10-CM | POA: Diagnosis not present

## 2023-04-04 DIAGNOSIS — K5904 Chronic idiopathic constipation: Secondary | ICD-10-CM | POA: Diagnosis not present

## 2023-04-04 DIAGNOSIS — G40109 Localization-related (focal) (partial) symptomatic epilepsy and epileptic syndromes with simple partial seizures, not intractable, without status epilepticus: Secondary | ICD-10-CM

## 2023-04-04 DIAGNOSIS — G4733 Obstructive sleep apnea (adult) (pediatric): Secondary | ICD-10-CM | POA: Insufficient documentation

## 2023-04-04 DIAGNOSIS — E230 Hypopituitarism: Secondary | ICD-10-CM

## 2023-04-04 DIAGNOSIS — E8881 Metabolic syndrome: Secondary | ICD-10-CM | POA: Diagnosis present

## 2023-04-04 DIAGNOSIS — L2084 Intrinsic (allergic) eczema: Secondary | ICD-10-CM | POA: Diagnosis not present

## 2023-04-04 DIAGNOSIS — E611 Iron deficiency: Secondary | ICD-10-CM | POA: Diagnosis present

## 2023-04-04 DIAGNOSIS — E063 Autoimmune thyroiditis: Secondary | ICD-10-CM

## 2023-04-04 DIAGNOSIS — E538 Deficiency of other specified B group vitamins: Secondary | ICD-10-CM | POA: Diagnosis not present

## 2023-04-04 DIAGNOSIS — Z8719 Personal history of other diseases of the digestive system: Secondary | ICD-10-CM | POA: Diagnosis not present

## 2023-04-04 DIAGNOSIS — M8589 Other specified disorders of bone density and structure, multiple sites: Secondary | ICD-10-CM

## 2023-04-04 DIAGNOSIS — E669 Obesity, unspecified: Secondary | ICD-10-CM | POA: Insufficient documentation

## 2023-04-04 NOTE — Progress Notes (Unsigned)
Chief Complaint:   OBESITY Kim Kim is here to discuss her progress with her obesity treatment plan along with follow-up of her obesity related diagnoses. Kim Kim is on the Category 1 Plan and states she is following her eating plan approximately 70% of the time. Kim Kim states she is doing gym workout classes for 30-60 minutes 1-3 times per week.  Today's visit was #: 15 Starting weight: 166 lbs Starting date: 07/12/2021 Today's weight: 173 lbs Today's date: 04/03/2023 Total lbs lost to date: 0 Total lbs lost since last in-office visit: 0  Interim History: Patient continues to work on her diet and exercise, but she is struggling with weight loss.  She was recently diagnosed with obstructive sleep apnea.  Subjective:   1. OSA (obstructive sleep apnea) Patient had a sleep study and she requests we discuss the results.  Assessment/Plan:   1. OSA (obstructive sleep apnea) Results were discussed in depth and patient was given a copy.  She has moderate obstructive sleep apnea with an AHI of 24 and hypoxia.  Patient was educated on the risk of her health and how this has negatively affected her weight loss.  She was told how this can cause heart failure as well as fatigue.  She agreed to try to use a CPAP as she is scheduled to pick it up later this week.  2. BMI 29.0-29.9,adult  3. Obesity, Beginning BMI 30.0 Kim Kim is currently in the action stage of change. As such, her goal is to continue with weight loss efforts. She has agreed to the Category 1 Plan.   Patient is to work on getting her obstructive sleep apnea treated as untreated obstructive sleep apnea can decrease RMR and decrease weight loss efforts even while reducing calories.  Exercise goals: As is.   Behavioral modification strategies: increasing lean protein intake and no skipping meals.  Kim Kim has agreed to follow-up with our clinic in 4 weeks. She was informed of the importance of frequent follow-up visits to maximize her  success with intensive lifestyle modifications for her multiple health conditions.   Objective:   Blood pressure 121/80, pulse 65, temperature 97.6 F (36.4 C), height 5\' 4"  (1.626 m), weight 173 lb (78.5 kg), SpO2 100 %. Body mass index is 29.7 kg/m.  Lab Results  Component Value Date   CREATININE 0.98 11/14/2022   BUN 18 11/14/2022   NA 138 11/14/2022   K 4.6 11/14/2022   CL 104 11/14/2022   CO2 26 11/14/2022   Lab Results  Component Value Date   ALT 11 11/16/2022   AST 18 11/16/2022   ALKPHOS 95 11/16/2022   BILITOT 0.3 11/16/2022   Lab Results  Component Value Date   HGBA1C 6.0 11/16/2022   HGBA1C 5.7 07/14/2021   HGBA1C 6.1 09/06/2020   HGBA1C 5.8 01/07/2020   Lab Results  Component Value Date   INSULIN 9.5 07/12/2021   Lab Results  Component Value Date   TSH 2.32 11/28/2021   Lab Results  Component Value Date   CHOL 118 11/16/2022   HDL 50.10 11/16/2022   LDLCALC 46 11/16/2022   TRIG 113.0 11/16/2022   CHOLHDL 2 11/16/2022   Lab Results  Component Value Date   VD25OH 36.99 09/06/2020   VD25OH 34.95 01/07/2020   Lab Results  Component Value Date   WBC 7.3 11/14/2022   HGB 11.8 (L) 11/14/2022   HCT 36.5 11/14/2022   MCV 83.9 11/14/2022   PLT 195 11/14/2022   Lab Results  Component Value  Date   IRON 38 (L) 11/16/2022   TIBC 369.6 11/16/2022   FERRITIN 21.8 11/16/2022   Attestation Statements:   Reviewed by clinician on day of visit: allergies, medications, problem list, medical history, surgical history, family history, social history, and previous encounter notes.  Time spent on visit including pre-visit chart review and post-visit care and charting was 45 minutes.   I, Burt Knack, am acting as transcriptionist for Quillian Quince, MD.  I have reviewed the above documentation for accuracy and completeness, and I agree with the above. -  Quillian Quince, MD

## 2023-04-04 NOTE — Patient Instructions (Signed)

## 2023-04-05 DIAGNOSIS — G4733 Obstructive sleep apnea (adult) (pediatric): Secondary | ICD-10-CM | POA: Diagnosis not present

## 2023-04-07 ENCOUNTER — Other Ambulatory Visit (INDEPENDENT_AMBULATORY_CARE_PROVIDER_SITE_OTHER): Payer: Self-pay | Admitting: Family Medicine

## 2023-04-07 DIAGNOSIS — E669 Obesity, unspecified: Secondary | ICD-10-CM

## 2023-04-07 DIAGNOSIS — R7303 Prediabetes: Secondary | ICD-10-CM

## 2023-04-12 ENCOUNTER — Telehealth: Payer: Self-pay | Admitting: Family Medicine

## 2023-04-12 NOTE — Telephone Encounter (Signed)
Contacted Kim Kim to schedule their annual wellness visit. Welcome to Medicare visit Due by 12/29/2023.  Thank you,  [facility] Support [facility] Direct dial  [phone]

## 2023-04-16 ENCOUNTER — Encounter: Payer: Self-pay | Admitting: Neurology

## 2023-04-16 ENCOUNTER — Other Ambulatory Visit: Payer: Self-pay | Admitting: Neurology

## 2023-04-16 DIAGNOSIS — G4730 Sleep apnea, unspecified: Secondary | ICD-10-CM

## 2023-04-17 DIAGNOSIS — R7303 Prediabetes: Secondary | ICD-10-CM | POA: Diagnosis not present

## 2023-04-24 ENCOUNTER — Ambulatory Visit (INDEPENDENT_AMBULATORY_CARE_PROVIDER_SITE_OTHER): Payer: Medicare Other | Admitting: Family Medicine

## 2023-04-26 ENCOUNTER — Ambulatory Visit: Payer: Self-pay | Admitting: Rheumatology

## 2023-04-27 ENCOUNTER — Other Ambulatory Visit (INDEPENDENT_AMBULATORY_CARE_PROVIDER_SITE_OTHER): Payer: Self-pay | Admitting: Family Medicine

## 2023-04-27 DIAGNOSIS — R7303 Prediabetes: Secondary | ICD-10-CM

## 2023-04-27 DIAGNOSIS — E669 Obesity, unspecified: Secondary | ICD-10-CM

## 2023-05-01 DIAGNOSIS — G4733 Obstructive sleep apnea (adult) (pediatric): Secondary | ICD-10-CM | POA: Diagnosis not present

## 2023-05-02 ENCOUNTER — Telehealth: Payer: Self-pay | Admitting: Neurology

## 2023-05-02 NOTE — Telephone Encounter (Signed)
Called to reschedule 8/13 initial cpap appt with pt due to MD being out. Pt stated that she will only be in town the week of 8/12 and would like to know what her options are as far as rescheduling goes. She would like to know if it can be done over video or if she can see an NP

## 2023-05-02 NOTE — Telephone Encounter (Signed)
Pt is unable to do VV for any week other than the week of 8/12 because she will not be in the state and insurance will not pay if they have VV out of state. Offered 8/13 at 9am with Amy for a VV and pt agreed to that appointment

## 2023-05-05 DIAGNOSIS — G4733 Obstructive sleep apnea (adult) (pediatric): Secondary | ICD-10-CM | POA: Diagnosis not present

## 2023-05-08 DIAGNOSIS — R9230 Dense breasts, unspecified: Secondary | ICD-10-CM | POA: Diagnosis not present

## 2023-05-08 DIAGNOSIS — Z1231 Encounter for screening mammogram for malignant neoplasm of breast: Secondary | ICD-10-CM | POA: Diagnosis not present

## 2023-05-14 DIAGNOSIS — K5904 Chronic idiopathic constipation: Secondary | ICD-10-CM | POA: Diagnosis not present

## 2023-05-14 DIAGNOSIS — K219 Gastro-esophageal reflux disease without esophagitis: Secondary | ICD-10-CM | POA: Diagnosis not present

## 2023-05-15 ENCOUNTER — Ambulatory Visit: Payer: Medicare Other | Admitting: Neurology

## 2023-05-17 DIAGNOSIS — R9230 Dense breasts, unspecified: Secondary | ICD-10-CM | POA: Diagnosis not present

## 2023-05-29 DIAGNOSIS — Z713 Dietary counseling and surveillance: Secondary | ICD-10-CM | POA: Diagnosis not present

## 2023-05-29 DIAGNOSIS — K219 Gastro-esophageal reflux disease without esophagitis: Secondary | ICD-10-CM | POA: Diagnosis not present

## 2023-06-05 DIAGNOSIS — G4733 Obstructive sleep apnea (adult) (pediatric): Secondary | ICD-10-CM | POA: Diagnosis not present

## 2023-06-09 ENCOUNTER — Other Ambulatory Visit (INDEPENDENT_AMBULATORY_CARE_PROVIDER_SITE_OTHER): Payer: Self-pay | Admitting: Family Medicine

## 2023-06-09 DIAGNOSIS — E669 Obesity, unspecified: Secondary | ICD-10-CM

## 2023-06-09 DIAGNOSIS — R7303 Prediabetes: Secondary | ICD-10-CM

## 2023-06-11 ENCOUNTER — Telehealth: Payer: Self-pay

## 2023-06-11 NOTE — Progress Notes (Unsigned)
PATIENT: Kim Kim DOB: [birth year]  REASON FOR VISIT: follow up HISTORY FROM: patient  Virtual Visit via Telephone Note  I connected with Kim Kim on 06/12/23 at  9:00 AM EDT by telephone and verified that I am speaking with the correct person using two identifiers.   I discussed the limitations, risks, security and privacy concerns of performing an evaluation and management service by telephone and the availability of in person appointments. I also discussed with the patient that there may be a patient responsible charge related to this service. The patient expressed understanding and agreed to proceed.   History of Present Illness:  06/12/23 ALL: Kim Kim is a 67 y.o. female here today for follow up for recently diagnosed OSA. She was seen in consult with Dr Vickey Huger 07/2022. Sleep study advised but not performed in time and revisit required 01/2023. She had PSG 02/2023 showing OSA with AHI 23.8/h and O2 nadir of 54%. AutoPAP advised. Pressure setting of 6-16 initially but increased to 6-20 following review with elevated AHI of 17/hr. Since, she reports doing fairly well on therapy. She continues to adjust to therapy. She has tried multiple mask but feels she is doing best with small air flow FFM. She may feel a little better rested but, otherwise, has not noted any significant benefit of use. She is trying to work on sleep hygiene. She was having a hard time with getting enough sleep. She is trying to go to bed and wake up around the same times, every day. She denies concerns with machine or supplies.   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 She continues Lamictal (Brand) 100mg  BID. She lives in [place] part of the year. Followed by Dr Ala Dach.     History (copied from Dr Dohmeier's previous note)  Kim Kim is a 67 y.o. female patient who is here for revisit 02/12/2023 as she needed a new order for a sleep study.    02-12-2023: Chief concern  according to patient :  Pt here today for a follow up visit due to switching to medicare. Needed updated follow up visit to document the need for a SS and reorder study. "We just have been between having colds and viral illnesses, and I finally came around to do the sleep study". She has had reportedly abnormal EEGs in [place], at [facility].  Has seen Dr. Marjory Lies 12-19-2022.  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    Kim Kim is a 28 -year-old caucasian female patient seen here upon referral on 08/07/2022 from  Dr. Yetta Barre.  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 She reports tonsil stones, and rhinitis. She is not known to snore. Nasal speech. She has insomnia, but not every night. Not a problem to go to sleep , but stay asleep, 4 times a months.     Kim Kim   has a past medical history of Allergy- rhinitis , Vit B12 deficiency Anemia, Carotid atherosclerosis, Temporal Epilepsy (HCC), Gastric ulcer, GERD (gastroesophageal reflux disease(HCC), Hiatal hernia, Hypothyroidism, Pituitary adenoma (HCC), thyroid nodules,  Hashimotos -autoimmune Thyroid disease, and Vitamin D deficiency.   Sleep relevant medical history: Nocturia one time , no Tonsillectomy, deviated septum but no repair, cleft palate.    Family medical /sleep history: 2 siblings on CPAP with OSA.    Social history:  Patient is working as  an X Engineer, structural in [place], now retired.  She worked daytime. She  lives in a household with spouse, children in [place] and [place],  Pets are not present. Tobacco use- never .  ETOH use ; infrequently - not in months, Caffeine intake in form of Coffee( 1  day) Soda( /) Tea ( /) or energy drinks. Regular exercise in form of daily strength training.       Sleep habits are as follows: The patient's dinner time is between 5 PM. The patient goes to bed at 8 PM to watch TV and sleeps at  9  PM and continues to sleep for 6-7 hours, wakes for one or none  bathroom breaks, the first time at 2-3 AM.  No sleep related seizures known. She has a deja-vue, same dreams over and over again.   The preferred sleep position is laterally , with the support of 1-2 pillows.  Dreams are reportedly frequent/vivid. She remembers her dreams.  7-7   AM is the usual rise time. The patient wakes up spontaneously at 6-7 AM  She reports not feeling refreshed or restored in AM, with symptoms such as dry mouth, phlegm .  Naps are taken infrequently, lasting from 15 to 30 minutes .   Observations/Objective:  Generalized: Well developed, in no acute distress  Mentation: Alert oriented to time, place, history taking. Follows all commands speech and language fluent   Assessment and Plan:  67 y.o. year old female  has a past medical history of Allergy, Anemia, Carotid atherosclerosis, Epilepsy (HCC), Gastric ulcer, GERD (gastroesophageal reflux disease), Growth hormone deficiency (HCC), Hiatal hernia, Hypothyroidism, Pituitary adenoma (HCC), Seizures (HCC), Thyroid disease, and Vitamin D deficiency. here with    ICD-10-CM   1. OSA on CPAP  G47.33 Cpap titration    2. 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HCC)  G40.109      Kim Kim reports continuing to adjust to CPAP therapy. Compliance report sows excellent daily and acceptable four hour use. AHI remains elevated despite pressure changes. Now at max pressure setting. I will order a titration study. Due to medical history, BiPAP may be better suited to manage events. She was educated on plan. She will continue CPAP therapy for now. Continue Lamictal for seizure management. I have encouraged her to sleep on her side. She will follow up with myself or Dr Vickey Huger pending titration results.    Orders Placed This Encounter  Procedures   Cpap titration    May need to transition to BiPAP therapy, current AHI 11/hr on CPAP 6-20cmH20. Will be leaving for [place] next week and  plans to return early October.    Standing Status:   Future    Standing Expiration Date:   06/11/2024    Order Specific Question:   Where should this test be performed:    Answer:   [facility]    No orders of the defined types were placed in this encounter.    Follow Up Instructions:  I discussed the assessment and treatment plan with the patient. The patient was provided an opportunity to ask questions and all were answered. The patient agreed with the plan and demonstrated an understanding of the instructions.   The patient was advised to call back or seek an in-person evaluation if the symptoms worsen or if the condition fails to improve as anticipated.  I provided 20 minutes of non-face-to-face time during this encounter. Patient located at their place of residence during Mychart visit. Provider is in the  office.    Shawnie Dapper, NP

## 2023-06-11 NOTE — Patient Instructions (Incomplete)
Please continue using your CPAP regularly. While your insurance requires that you use CPAP at least 4 hours each night on 70% of the nights, I recommend, that you not skip any nights and use it throughout the night if you can. Getting used to CPAP and staying with the treatment long term does take time and patience and discipline. Untreated obstructive sleep apnea when it is moderate to severe can have an adverse impact on cardiovascular health and raise her risk for heart disease, arrhythmias, hypertension, congestive heart failure, stroke and diabetes. Untreated obstructive sleep apnea causes sleep disruption, nonrestorative sleep, and sleep deprivation. This can have an impact on your day to day functioning and cause daytime sleepiness and impairment of cognitive function, memory loss, mood disturbance, and problems focussing. Using CPAP regularly can improve these symptoms.  We will update supply orders, today. Continue Lamictal 100mg  twice daily.   Follow up in 1 year

## 2023-06-12 ENCOUNTER — Ambulatory Visit (INDEPENDENT_AMBULATORY_CARE_PROVIDER_SITE_OTHER): Payer: Medicare Other | Admitting: Family Medicine

## 2023-06-12 ENCOUNTER — Telehealth (INDEPENDENT_AMBULATORY_CARE_PROVIDER_SITE_OTHER): Payer: Medicare Other | Admitting: Family Medicine

## 2023-06-12 ENCOUNTER — Encounter (INDEPENDENT_AMBULATORY_CARE_PROVIDER_SITE_OTHER): Payer: Self-pay | Admitting: Family Medicine

## 2023-06-12 ENCOUNTER — Encounter: Payer: Medicare Other | Admitting: Neurology

## 2023-06-12 ENCOUNTER — Encounter: Payer: Self-pay | Admitting: Family Medicine

## 2023-06-12 VITALS — BP 114/80 | HR 61 | Temp 97.4°F | Ht 64.0 in | Wt 171.0 lb

## 2023-06-12 DIAGNOSIS — G4733 Obstructive sleep apnea (adult) (pediatric): Secondary | ICD-10-CM | POA: Diagnosis not present

## 2023-06-12 DIAGNOSIS — E669 Obesity, unspecified: Secondary | ICD-10-CM

## 2023-06-12 DIAGNOSIS — G40109 Localization-related (focal) (partial) symptomatic epilepsy and epileptic syndromes with simple partial seizures, not intractable, without status epilepticus: Secondary | ICD-10-CM

## 2023-06-12 DIAGNOSIS — Z6829 Body mass index (BMI) 29.0-29.9, adult: Secondary | ICD-10-CM

## 2023-06-12 DIAGNOSIS — R7303 Prediabetes: Secondary | ICD-10-CM | POA: Diagnosis not present

## 2023-06-12 DIAGNOSIS — E88819 Insulin resistance, unspecified: Secondary | ICD-10-CM

## 2023-06-12 DIAGNOSIS — K219 Gastro-esophageal reflux disease without esophagitis: Secondary | ICD-10-CM | POA: Diagnosis not present

## 2023-06-13 ENCOUNTER — Telehealth: Payer: Self-pay | Admitting: Family Medicine

## 2023-06-13 NOTE — Telephone Encounter (Signed)
Call back to patient, made aware that we are scheduling titration sleep studies into October/ November and that's when patient will be back in town. She is in agreement to continue current therapy until next sleep study results. She states she wants to keep all treatments and therapies at [facility]. Advised someone would be calling ro schedule but also need insurance approval. Patient verbalized understanding.

## 2023-06-13 NOTE — Telephone Encounter (Signed)
Pt called wanting to know if an Rx can be written for her so that she can do her cpap machine in [place]. Pt would like to pick it up today if possible before 3pm.

## 2023-06-13 NOTE — Progress Notes (Unsigned)
Chief Complaint:   OBESITY Kim Kim is here to discuss her progress with her obesity treatment plan along with follow-up of her obesity related diagnoses. Kim Kim is on the Category 1 Plan and states she is following her eating plan approximately (unknown% of the time. Kim Kim states she is doing water aerobics, strengthening, and walking 4 times per week.  Today's visit was #: 16 Starting weight: 166 lbs Starting date: 07/12/2021 Today's weight: 171 lbs Today's date: 06/12/2023 Total lbs lost to date: 0 Total lbs lost since last in-office visit: 2  Interim History: Patient has done well with her weight loss.  She is working on increasing fiber and protein.  She is following with her integrative medicine doctor and her GI dietitian.  She was exercising regularly but this has decreased recently.  Subjective:   1. OSA (obstructive sleep apnea) Patient is now on CPAP and she is needing to go for a titration study.  She thinks she may need a BiPAP.  2. Gastroesophageal reflux disease, unspecified whether esophagitis present Patient has tapered off Prilosec and she is on prevacid.  She is trying to decrease caffeine and EtOH.  She is trying to stop eating between 5 to 6 PM.  3. Prediabetes Patient was prescribed metformin but patient declined to not take this.  She has questions about the best way to lose weight and improve her insulin resistance.  Assessment/Plan:   1. OSA (obstructive sleep apnea) We discussed the importance of controlling sleep apnea for overall health including effects on RMR.  2. Gastroesophageal reflux disease, unspecified whether esophagitis present Patient was encouraged to continue with her GIs advice and will continue to work on her weight loss.  3. Prediabetes Patient was encouraged to increase exercise in addition to her diet to improve her insulin resistance.  We discussed the mechanisms of action with metformin and she will reconsider starting this at 1/2  pill daily.  4. BMI 29.0-29.9,adult  5. Obesity, Beginning BMI 30.0 Kim Kim is currently in the action stage of change. As such, her goal is to continue with weight loss efforts. She has agreed to the Category 1 Plan.   Exercise goals: Start cardio and strengthening 10 minutes/day.  Behavioral modification strategies: increasing lean protein intake, decreasing simple carbohydrates, and increasing vegetables.  Kim Kim has agreed to follow-up with our clinic in 8 weeks. She was informed of the importance of frequent follow-up visits to maximize her success with intensive lifestyle modifications for her multiple health conditions.   Objective:   Blood pressure 114/80, pulse 61, temperature (!) 97.4 F (36.3 C), height 5\' 4"  (1.626 m), weight 171 lb (77.6 kg), SpO2 97%. Body mass index is 29.35 kg/m.  Lab Results  Component Value Date   CREATININE 0.98 11/14/2022   BUN 18 11/14/2022   NA 138 11/14/2022   K 4.6 11/14/2022   CL 104 11/14/2022   CO2 26 11/14/2022   Lab Results  Component Value Date   ALT 11 11/16/2022   AST 18 11/16/2022   ALKPHOS 95 11/16/2022   BILITOT 0.3 11/16/2022   Lab Results  Component Value Date   HGBA1C 6.0 11/16/2022   HGBA1C 5.7 07/14/2021   HGBA1C 6.1 09/06/2020   HGBA1C 5.8 01/07/2020   Lab Results  Component Value Date   INSULIN 9.5 07/12/2021   Lab Results  Component Value Date   TSH 2.32 11/28/2021   Lab Results  Component Value Date   CHOL 118 11/16/2022   HDL 50.10 11/16/2022  LDLCALC 46 11/16/2022   TRIG 113.0 11/16/2022   CHOLHDL 2 11/16/2022   Lab Results  Component Value Date   VD25OH 36.99 09/06/2020   VD25OH 34.95 01/07/2020   Lab Results  Component Value Date   WBC 7.3 11/14/2022   HGB 11.8 (L) 11/14/2022   HCT 36.5 11/14/2022   MCV 83.9 11/14/2022   PLT 195 11/14/2022   Lab Results  Component Value Date   IRON 38 (L) 11/16/2022   TIBC 369.6 11/16/2022   FERRITIN 21.8 11/16/2022   Attestation Statements:    Reviewed by clinician on day of visit: allergies, medications, problem list, medical history, surgical history, family history, social history, and previous encounter notes.  Time spent on visit including pre-visit chart review and post-visit care and charting was 45 minutes.   I, Burt Knack, am acting as transcriptionist for Quillian Quince, MD.  I have reviewed the above documentation for accuracy and completeness, and I agree with the above. -  Quillian Quince, MD

## 2023-06-13 NOTE — Telephone Encounter (Signed)
Call to patient, she states she needs an RX to have sleep test done in [place]. She isn;t sure where but will find out and let us know.

## 2023-06-18 NOTE — Telephone Encounter (Signed)
I called the patient

## 2023-06-18 NOTE — Telephone Encounter (Signed)
I called the patient, she did not picked up I left her a voicemail mail and left my direct number to call back to schedule her SS.. I also did state that I was sending her a mychart message as well.Kim Kim

## 2023-06-25 DIAGNOSIS — E039 Hypothyroidism, unspecified: Secondary | ICD-10-CM | POA: Diagnosis not present

## 2023-06-25 DIAGNOSIS — M899 Disorder of bone, unspecified: Secondary | ICD-10-CM | POA: Diagnosis not present

## 2023-06-25 DIAGNOSIS — E230 Hypopituitarism: Secondary | ICD-10-CM | POA: Diagnosis not present

## 2023-06-25 DIAGNOSIS — E063 Autoimmune thyroiditis: Secondary | ICD-10-CM | POA: Diagnosis not present

## 2023-06-29 ENCOUNTER — Other Ambulatory Visit (INDEPENDENT_AMBULATORY_CARE_PROVIDER_SITE_OTHER): Payer: Self-pay | Admitting: Family Medicine

## 2023-06-29 DIAGNOSIS — R7303 Prediabetes: Secondary | ICD-10-CM

## 2023-06-29 DIAGNOSIS — E669 Obesity, unspecified: Secondary | ICD-10-CM

## 2023-07-03 ENCOUNTER — Encounter: Payer: Medicare Other | Admitting: Neurology

## 2023-07-03 ENCOUNTER — Telehealth (INDEPENDENT_AMBULATORY_CARE_PROVIDER_SITE_OTHER): Payer: Self-pay | Admitting: Family Medicine

## 2023-07-03 ENCOUNTER — Encounter (INDEPENDENT_AMBULATORY_CARE_PROVIDER_SITE_OTHER): Payer: Self-pay

## 2023-07-03 DIAGNOSIS — K219 Gastro-esophageal reflux disease without esophagitis: Secondary | ICD-10-CM | POA: Diagnosis not present

## 2023-07-03 DIAGNOSIS — Z713 Dietary counseling and surveillance: Secondary | ICD-10-CM | POA: Diagnosis not present

## 2023-07-03 DIAGNOSIS — K5904 Chronic idiopathic constipation: Secondary | ICD-10-CM | POA: Diagnosis not present

## 2023-07-03 NOTE — Telephone Encounter (Signed)
I contacted the patient to reschedule the appointment with Dr. Dalbert Garnet on 08/13/2023 as she is currently out of the office. Sent mychart message as well

## 2023-07-04 DIAGNOSIS — G4733 Obstructive sleep apnea (adult) (pediatric): Secondary | ICD-10-CM | POA: Diagnosis not present

## 2023-07-04 DIAGNOSIS — R7982 Elevated C-reactive protein (CRP): Secondary | ICD-10-CM | POA: Diagnosis not present

## 2023-07-04 DIAGNOSIS — R7989 Other specified abnormal findings of blood chemistry: Secondary | ICD-10-CM | POA: Diagnosis not present

## 2023-07-04 DIAGNOSIS — J358 Other chronic diseases of tonsils and adenoids: Secondary | ICD-10-CM | POA: Diagnosis not present

## 2023-07-04 DIAGNOSIS — M2652 Limited mandibular range of motion: Secondary | ICD-10-CM | POA: Diagnosis not present

## 2023-07-04 DIAGNOSIS — R29818 Other symptoms and signs involving the nervous system: Secondary | ICD-10-CM | POA: Diagnosis not present

## 2023-07-04 DIAGNOSIS — Z87730 Personal history of (corrected) cleft lip and palate: Secondary | ICD-10-CM | POA: Diagnosis not present

## 2023-07-04 DIAGNOSIS — R7303 Prediabetes: Secondary | ICD-10-CM | POA: Diagnosis not present

## 2023-07-04 DIAGNOSIS — Q388 Other congenital malformations of pharynx: Secondary | ICD-10-CM | POA: Diagnosis not present

## 2023-07-04 DIAGNOSIS — M7911 Myalgia of mastication muscle: Secondary | ICD-10-CM | POA: Diagnosis not present

## 2023-07-04 DIAGNOSIS — M26641 Arthritis of right temporomandibular joint: Secondary | ICD-10-CM | POA: Diagnosis not present

## 2023-07-06 DIAGNOSIS — G4733 Obstructive sleep apnea (adult) (pediatric): Secondary | ICD-10-CM | POA: Diagnosis not present

## 2023-07-18 DIAGNOSIS — K5900 Constipation, unspecified: Secondary | ICD-10-CM | POA: Diagnosis not present

## 2023-07-18 DIAGNOSIS — K219 Gastro-esophageal reflux disease without esophagitis: Secondary | ICD-10-CM | POA: Diagnosis not present

## 2023-07-23 DIAGNOSIS — L304 Erythema intertrigo: Secondary | ICD-10-CM | POA: Diagnosis not present

## 2023-07-31 DIAGNOSIS — K5900 Constipation, unspecified: Secondary | ICD-10-CM | POA: Diagnosis not present

## 2023-08-02 DIAGNOSIS — K5900 Constipation, unspecified: Secondary | ICD-10-CM | POA: Diagnosis not present

## 2023-08-03 DIAGNOSIS — R7303 Prediabetes: Secondary | ICD-10-CM | POA: Diagnosis not present

## 2023-08-05 DIAGNOSIS — G4733 Obstructive sleep apnea (adult) (pediatric): Secondary | ICD-10-CM | POA: Diagnosis not present

## 2023-08-08 DIAGNOSIS — R1013 Epigastric pain: Secondary | ICD-10-CM | POA: Diagnosis not present

## 2023-08-08 DIAGNOSIS — K2210 Ulcer of esophagus without bleeding: Secondary | ICD-10-CM | POA: Diagnosis not present

## 2023-08-08 DIAGNOSIS — R109 Unspecified abdominal pain: Secondary | ICD-10-CM | POA: Diagnosis not present

## 2023-08-08 DIAGNOSIS — K3189 Other diseases of stomach and duodenum: Secondary | ICD-10-CM | POA: Diagnosis not present

## 2023-08-08 DIAGNOSIS — K317 Polyp of stomach and duodenum: Secondary | ICD-10-CM | POA: Diagnosis not present

## 2023-08-08 DIAGNOSIS — K2950 Unspecified chronic gastritis without bleeding: Secondary | ICD-10-CM | POA: Diagnosis not present

## 2023-08-08 DIAGNOSIS — K219 Gastro-esophageal reflux disease without esophagitis: Secondary | ICD-10-CM | POA: Diagnosis not present

## 2023-08-09 DIAGNOSIS — G4733 Obstructive sleep apnea (adult) (pediatric): Secondary | ICD-10-CM | POA: Diagnosis not present

## 2023-08-09 DIAGNOSIS — K269 Duodenal ulcer, unspecified as acute or chronic, without hemorrhage or perforation: Secondary | ICD-10-CM | POA: Diagnosis not present

## 2023-08-09 DIAGNOSIS — E663 Overweight: Secondary | ICD-10-CM | POA: Diagnosis not present

## 2023-08-09 DIAGNOSIS — G40109 Localization-related (focal) (partial) symptomatic epilepsy and epileptic syndromes with simple partial seizures, not intractable, without status epilepticus: Secondary | ICD-10-CM | POA: Diagnosis not present

## 2023-08-13 ENCOUNTER — Ambulatory Visit (INDEPENDENT_AMBULATORY_CARE_PROVIDER_SITE_OTHER): Payer: Medicare Other | Admitting: Family Medicine

## 2023-08-27 ENCOUNTER — Ambulatory Visit (INDEPENDENT_AMBULATORY_CARE_PROVIDER_SITE_OTHER): Payer: Medicare Other | Admitting: Neurology

## 2023-08-27 DIAGNOSIS — G4733 Obstructive sleep apnea (adult) (pediatric): Secondary | ICD-10-CM | POA: Diagnosis not present

## 2023-08-27 DIAGNOSIS — Q388 Other congenital malformations of pharynx: Secondary | ICD-10-CM

## 2023-08-27 DIAGNOSIS — G40109 Localization-related (focal) (partial) symptomatic epilepsy and epileptic syndromes with simple partial seizures, not intractable, without status epilepticus: Secondary | ICD-10-CM

## 2023-08-27 DIAGNOSIS — J342 Deviated nasal septum: Secondary | ICD-10-CM

## 2023-08-27 DIAGNOSIS — Z87730 Personal history of (corrected) cleft lip and palate: Secondary | ICD-10-CM

## 2023-08-27 DIAGNOSIS — Z9989 Dependence on other enabling machines and devices: Secondary | ICD-10-CM

## 2023-08-28 ENCOUNTER — Ambulatory Visit (INDEPENDENT_AMBULATORY_CARE_PROVIDER_SITE_OTHER): Payer: Medicare Other | Admitting: Family Medicine

## 2023-08-31 ENCOUNTER — Other Ambulatory Visit: Payer: Self-pay | Admitting: Neurology

## 2023-08-31 DIAGNOSIS — Q388 Other congenital malformations of pharynx: Secondary | ICD-10-CM

## 2023-08-31 DIAGNOSIS — Z9989 Dependence on other enabling machines and devices: Secondary | ICD-10-CM | POA: Insufficient documentation

## 2023-08-31 DIAGNOSIS — G4733 Obstructive sleep apnea (adult) (pediatric): Secondary | ICD-10-CM

## 2023-08-31 DIAGNOSIS — Z87730 Personal history of (corrected) cleft lip and palate: Secondary | ICD-10-CM

## 2023-08-31 NOTE — Progress Notes (Signed)
1) Dx of OSA with high residual AHI under auto CPAP use in a patient with cleft palate, who had surgery (Palatoplasty- soft palate was altered, no bone transplant), but her cleft palate was only covered by soft tissue.  She cannot snore ! She can also not tolerate nasal interfaces for CPAP and we started with a ResMedAirfit F30i in small size.  2) CPAP did control OSA while the patient slept on her side, but not in supine sleep. BiPAP also barely controlled OSA in supine- but the patient slept fairly unfragmented (without waking periods ) in supine and not while on her side.  It was therefor goal to reduce the AHI in supine position by PAP adjustment as the patient was not getting more quality sleep in another position.   RECOMMENDATIONS: ResMedAirfit F30i in small size.  BiPAP at 17/13 cm water pressure was the best tolerated and provided the most effective treatment. A higher air leak is expected in the setting of FFM use and challenging oral palate anatomy. Residual CMS based AHI in supine was 6.92/h, all other pressure settings were associated with higher supine sleep AHIs.     Melvyn Novas, MD [facility] Board certified by The [employer] of [employer] Medicine and Diplomate of the [employer] of Sleep Medicine.

## 2023-08-31 NOTE — Procedures (Signed)
PATIENT NAME:  Kim Kim         DATE OF BIRTH:  06-23-1956  PATIENT ID:  [id number]    TYPE OF STUDY:  CPAP  READING PHYSICIAN: Kim Novas, MD Ordering : Kim Dapper, NP. Primary Neurologist , Kim Kim.   SCORING TECHNICIAN: Margaretann Loveless, RPSGT   HISTORY:  Kim Kim is a 67 y.o. 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 NP revisit on 02/12/2023 as the patient had transitioned to Medicare, she needed a new order for a sleep study- order had expired. PSG took finally place in [id number] and let to diagnosis of moderate OSA, hypoxia, not unexpected in a cleft palate patient. CPAP tolerated, but high residual AHI and high air leak were reported. Titration study was ordered by NP L[place]ax for this reason.     Kim Kim has a cleft palate. She reports a hx of tonsil stones and rhinitis. Nasal speech. No snoring. She has sometimes insomnia, but only 1-4 days a months. Not a problem to go to sleep but stay asleep. Sleep hygiene and sleep routines were addressed.   Pt has last seen Kim. Marjory Kim 12-19-2022 for  TLE, has followed with Kim Kim yearly.  Epilepsy: Dx in [place], at [facility].     The Epworth Sleepiness Scale was 2/ 24 points   FSS endorsed at 11/ 63 points.   ADDITIONAL INFORMATION:  Height: 64.0 in Weight: 170 lb (BMI 29) Neck Size: 14.0 in    MEDICATIONS: Desowen, Colace, Ferrous Sulfate, Nizoral, Lamictal, Prilosec, Crestor, Synthroid  SLEEP CONTINUITY AND SLEEP ARCHITECTURE:  Lights off was at 22:13: and lights on 05:00: (6.8 hours in bed).   The study was ordered as a titration study, started with ResMedAirfit F30i in small size at 8 cm CPAP and titrated up to 13 cm. AHI became more severe, 60/h at 10 cm water. The patient only had apneas while she slept supine, but she slept supine for over80% of TST. BiPAP was initiated, 14/10 cm water and explored to a final 19/15 cm water. the best results were seen at  17/13 cm water pressure, all sleep in supine,  AHI was 7/h.    Total sleep time was 345.5 minutes with a decreased sleep efficiency at 84.9%. Sleep latency was normal at 23.0 minutes.   Of the total sleep time, the percentage of stage N1 sleep was 1.7%, stage N2 sleep was 72.6%, stage N3 sleep was 12.9%, and REM sleep was 12.7%. There were 2 Stage R periods observed on this study night, 16 awakenings (i.e. transitions to Stage W from any sleep stage), and 58.0 total stage transitions. Wake after sleep onset (WASO) time accounted for 38 minutes.  AROUSAL: There were 63 arousals in total, for an arousal index of 10.9 arousals/hour.  Of these, 31 were identified as respiratory-related arousals (5.4 /h), 0 were PLM-related arousals (0.0 /h), and 42 were non-specific arousals (7.3 /h)  RESPIRATORY MONITORING:  Based on CMS criteria (using a 4% oxygen desaturation rule for scoring hypopneas), there were 55 apneas (54 obstructive; 1 central; 0 mixed), and 21 hypopneas.  The Apnea index was 9.6/h.  The Hypopnea index was 3.6/h.  The AHI/ apnea-hypopnea index was 13.2/h . AHI was 16.1/h in supine, 0.0 non-supine; 34.1/h during REM-all supine REM). There were 0 respiratory effort-related arousals (RERAs).    Based on AASM criteria (using a 3% oxygen desaturation and /or arousal rule for scoring hypopneas), there were 55 apneas (  54 obstructive; 1 central; 0 mixed), and 25 hypopneas. Apnea index was 9.6. Hypopnea index was 4.3. The apnea-hypopnea index was 13.9 overall (16.9 supine, 0.0 non-supine; 34.1 REM, 34.1 supine REM). There were 0 respiratory effort-related arousals (RERAs). BODY POSITION:: supine 280 minutes (81.0%), non-supine 65.5 minutes (19.0%); right 12 minutes (3.5%), left 53 minutes (15.5%), and prone 00 minutes (0.0%). Total supine REM sleep time was 44 minutes (100.0% of total REM sleep).   OXIMETRY: Total sleep time spent at, or below 88% was 2.2 minutes, or 0.6% of total sleep time. Snoring  was classified as not present. EKG. NSR.  The average heart rate during sleep was 68 bpm.  The maximum heart rate during sleep was 93 bpm. The maximum heart rate during recording was 93.    LIMB MOVEMENTS: There were 0 periodic limb movements of sleep (0.0/h), of which 0 (0.0/h)     IMPRESSION: 1) OSA in a patient with cleft palate, who had surgery at age 49-6 (Palatoplasty- soft palate was altered, no bone transplant), her cleft palate was only covered by soft tissue.  She cannot snore ! She can also not tolerate nasal interfaces for CPAP and started with a ResMedAirfit F30i in small size.  2) CPAP did control OSA while the patient slept on her side, but not in supine sleep. BiPAP also barely controlled OSA in supine- but the patient slept unfragmented and without waking periods in supine.  It is therefor needed to reduce the AHI in supine position as the patient is not getting more quality sleep in another position.           RECOMMENDATIONS: ResMedAirfit F30i in small size.  BiPAP at 17/13 cm water pressure was the best tolerated and provided the most effective treatment. A higher air leak is expected in the setting of FFM use and challenging oral palate anatomy. Residual AHI in supine was 6.92/h, all other pressure settings were associated with higher supine sleep AHIs.     Kim Novas, MD [facility] Board certified by The [employer] of Sleep Medicine and Diplomate of the [employer] of Sleep Medicine. Board certified In Neurology through the ABPN, Fellow of the [employer] of Neu[employer]. Medical Director of [facility].               CPAP/Bilevel Report    General Information  Name: Kim Kim, Kim Kim BMI: 29 Physician: ,   ID: [id number] Height: 64 in Technician: Margaretann Loveless  Sex: Female Weight: 170 lb Record: [id number]  Age: 35 [July 16, 1956] Date: 08/27/2023 Scorer: Margaretann Loveless   Recommended Settings  IPAP: N/A cmH20 EPAP: N/A cmH2O AHI: N/A AHI (4%): N/A   Pressure IPAP/EPAP 00 08 10 14 / 10 15 / 11 12 16  / 12 13   O2 Vol 0.0 0.0 0.0 0.0 0.0 0.0 0.0 0.0  Time TRT 2.27m 102.32m 5.5m 56.70m 13.8m 41.7m 38.23m 13.41m   TST 0.55m 70.80m 5.58m 56.38m 13.36m 19.63m 37.80m 12.47m  Sleep Stage % Wake 0.0 14.6 9.1 0.0 0.0 52.4 1.3 7.4   % REM 0.0 0.0 0.0 31.3 77.8 0.0 0.0 0.0   % N1 0.0 3.6 0.0 0.0 0.0 12.8 0.0 0.0   % N2 0.0 68.6 100.0 43.8 22.2 87.2 100.0 100.0   % N3 0.0 27.9 0.0 25.0 0.0 0.0 0.0 0.0  Respiratory Total Events 0 3 5 10 8 2 10 8    Obs. Apn. 0 0 5 4 0 1 5 7    Mixed Apn. 0  0 0 0 0 0 0 0   Cen. Apn. 0 0 0 1 0 0 0 0   Hypopneas 0 3 0 5 8 1 5 1    AHI 0.00 2.57 60.00 10.71 35.56 6.15 16.00 38.40   Supine AHI 0.00 7.27 60.00 10.71 35.56 16.00 16.00 38.40   Prone AHI 0.00 0.00 0.00 0.00 0.00 0.00 0.00 0.00   Side AHI 0.00 1.12 0.00 0.00 0.00 0.00 0.00 0.00  Respiratory (4%) Hypopneas (4%) 0.00 3.00 0.00 4.00 7.00 1.00 4.00 1.00   AHI (4%) 0.00 2.57 60.00 9.64 31.11 6.15 14.40 38.40   Supine AHI (4%) 0.00 7.27 60.00 9.64 31.11 16.00 14.40 38.40   Prone AHI (4%) 0.00 0.00 0.00 0.00 0.00 0.00 0.00 0.00   Side AHI (4%) 0.00 1.12 0.00 0.00 0.00 0.00 0.00 0.00  Desat Profile <= 90% 0.75m 0.12m 0.43m 1.61m 0.81m 4.78m 0.77m 0.43m   <= 80% 0.53m 0.46m 0.77m 0.60m 0.54m 4.88m 0.23m 0.30m   <= 70% 0.56m 0.50m 0.57m 0.57m 0.5m 4.15m 0.69m 0.33m   <= 60% 0.11m 0.43m 0.55m 0.69m 0.76m 4.14m 0.55m 0.6m  Arousal Index Apnea 0.0 0.0 60.0 2.1 0.0 3.1 3.2 28.8   Hypopnea 0.0 1.7 12.0 2.1 13.3 3.1 3.2 4.8   LM 0.0 0.0 0.0 0.0 0.0 0.0 0.0 0.0   Spontaneous 0.0 8.6 0.0 6.4 0.0 36.9 4.8 9.6   Pressure IPAP/EPAP 17 / 13 18 / 14 19 / 15   O2 Vol 0.0 0.0 0.0  Time TRT 52.53m 15.70m 67.47m   TST 52.32m 15.17m 64.23m  Sleep Stage % Wake 0.0 0.0 4.4   % REM 0.0 63.3 10.1   % N1 0.0 0.0 1.6   % N2 78.8 36.7 88.4   % N3 21.2 0.0 0.0  Respiratory Total Events 6 14 14    Obs. Apn. 5 14 13    Mixed Apn. 0 0 0   Cen. Apn. 0 0 0   Hypopneas 1 0  1   AHI 6.92 56.00 13.02   Supine AHI 6.92 56.00 13.02   Prone AHI 0.00 0.00 0.00   Side AHI 0.00 0.00 0.00  Respiratory (4%) Hypopneas (4%) 1.00 0.00 0.00   AHI (4%) 6.92 56.00 12.09   Supine AHI (4%) 6.92 56.00 12.09   Prone AHI (4%) 0.00 0.00 0.00   Side AHI (4%) 0.00 0.00 0.00  Desat Profile <= 90% 0.68m 2.73m 0.3m   <= 80% 0.46m 0.79m 0.19m   <= 70% 0.17m 0.2m 0.13m   <= 60% 0.30m 0.60m 0.14m  Arousal Index Apnea 0.0 8.0 0.9   Hypopnea 0.0 0.0 0.0   LM 0.0 0.0 0.0   Spontaneous 3.5 0.0 5.6   [facility] CPAP Summary    General Information  Name: Kim Kim, Kim Kim BMI: 29.18 Physician: Kim Novas, MD  ID: [id number] Height: 64.0 in Technician: Margaretann Loveless, RPSGT  Sex: Female Weight: 170.0 lb Record: [id number]  Age: 80 [September 17, 1956] Date: 08/27/2023     Medical & Medication History    06/12/23 ALL: Kim Kim is a 67 y.o. female patient with TLE, Kim Marjory Lies, MD.   here today for follow up for recently diagnosed OSA. She was seen in consult with Kim Vickey Huger 07/2022. Sleep study advised but not performed in time and revisit required 01/2023. She had finally a PSG in 02/2023 showing OSA with AHI 23.8/h and O2 nadir of 54%. AutoPAP advised. Pressure setting of 6-16 initially but increased to 6-20 following review with elevated AHI of 17/hr.  Since, she reports doing fairly well on therapy. she has high residual AHI of 11-12/h and an in-lab titration study was ordered by Kim Dapper, NP.  She continues to adjust to therapy. She has tried multiple mask but feels she is doing best with small air flow FFM. She may feel a little better rested but, otherwise, has not noted any significant benefit of use. She is trying to work on sleep hygiene. She was having a hard time with getting enough sleep. She is trying to go to bed and wake up around the same times, every day. She denies concerns with machine or supplies.  Desowen, Colace, Ferrous Sulfate, Nizoral,  Lamictal, Prilosec, Crestor, Synthroid   Sleep Disorder      Comments   The patient came into the sleep lab for a CPAP titration study. The patient had a PSG on 03/18/23 with our sleep lab. The total AHI was 23.8/h and O2 nadir of 54%. The patient was fitted with a Civil engineer, contracting fit F30i (FFM) size Small. CPAP was started at [place] with EPR of 1. CPAP was increased to 13cmH2O with EPR of 3 due to respiratory events. The patient was switch to BiPAP 14/10cmH2O due to respiratory events and frequent arousals. BiPAP was titrated up to 19/15cmH2O. The patient was not fixed during REM while supine. One restroom break. EKG did not show any obvious cardiac arrhythmias. No snoring. Respiratory events scored with a 4% desat. The patient slept supine mostly. The patient had transitional events.     CPAP start time: 10:15:36 PM CPAP end time: 05:00:28 AM   Time Total Supine Side Prone Upright  Recording (TRT) 6h 44.31m 5h 6.2m 1h 38.25m 0h 0.3m 0h 0.59m  Sleep (TST) 5h 45.76m 4h 40.78m 1h 5.71m 0h 0.47m 0h 0.34m   Latency N1 N2 N3 REM Onset Per. Slp. Eff.  Actual 0h 0.9m 0h 1.65m 0h 50.26m 3h 0.25m 0h 20.96m 0h 20.61m 85.41%   Stg Dur Wake N1 N2 N3 REM  Total 59.0 6.0 251.0 44.5 44.0  Supine 26.0 2.5 208.5 25.0 44.0  Side 33.0 3.5 42.5 19.5 0.0  Prone 0.0 0.0 0.0 0.0 0.0  Upright 0.0 0.0 0.0 0.0 0.0   Stg % Wake N1 N2 N3 REM  Total 14.6 1.7 72.6 12.9 12.7  Supine 6.4 0.7 60.3 7.2 12.7  Side 8.2 1.0 12.3 5.6 0.0  Prone 0.0 0.0 0.0 0.0 0.0  Upright 0.0 0.0 0.0 0.0 0.0     Apnea Summary Sub Supine Side Prone Upright  Total 55 Total 55 55 0 0 0    REM 15 15 0 0 0    NREM 40 40 0 0 0  Obs 54 REM 14 14 0 0 0    NREM 40 40 0 0 0  Mix 0 REM 0 0 0 0 0    NREM 0 0 0 0 0  Cen 1 REM 1 1 0 0 0    NREM 0 0 0 0 0   Rera Summary Sub Supine Side Prone Upright  Total 0 Total 0 0 0 0 0    REM 0 0 0 0 0    NREM 0 0 0 0 0   Hypopnea Summary Sub Supine Side Prone Upright  Total 25 Total 25 24 1  0 0    REM 10 10 0 0 0     NREM 15 14 1  0 0   4% Hypopnea Summary Sub Supine Side Prone Upright  Total (4%) 21 Total 21  20 1 0 0    REM 10 10 0 0 0    NREM 11 10 1  0 0     AHI Total Obs Mix Cen  13.89 Apnea 9.55 9.38 0.00 0.17   Hypopnea 4.34 -- -- --  13.20 Hypopnea (4%) 3.65 -- -- --    Total Supine Side Prone Upright  Position AHI 13.89 16.93 0.92 0.00 0.00  REM AHI 34.09   NREM AHI 10.95   Position RDI 13.89 16.93 0.92 0.00 0.00  REM RDI 34.09   NREM RDI 10.95    4% Hypopnea Total Supine Side Prone Upright  Position AHI (4%) 13.20 16.07 0.92 0.00 0.00  REM AHI (4%) 34.09   NREM AHI (4%) 10.15   Position RDI (4%) 13.20 16.07 0.92 0.00 0.00  REM RDI (4%) 34.09   NREM RDI (4%) 10.15    Desaturation Information  <100% <90% <80% <70% <60% <50% <40%  Supine 71 7 0 0 0 0 0  Side 6 0 0 0 0 0 0  Prone 0 0 0 0 0 0 0  Upright 0 0 0 0 0 0 0  Total 77 7 0 0 0 0 0  Desaturation threshold setting: 3% Minimum desaturation setting: 10 seconds SaO2 nadir: 81% The longest event was a 107 sec obstructive Hypopnea with a minimum SaO2 of 85%. The lowest SaO2 was 81% associated with a 77 sec obstructive Apnea. EKG Rates EKG Avg Max Min  Awake 70 88 62  Asleep 68 93 57  EKG Events: none Awakening/Arousal Information # of Awakenings 16  Wake after sleep onset 38.16m  Wake after persistent sleep 38.44m   Arousal Assoc. Arousals Index  Apneas 19 3.3  Hypopneas 12 2.1  Leg Movements 0 0.0  Snore 0.0 0.0  PTT Arousals 0 0.0  Spontaneous 42 7.3  Total 73 12.7  Myoclonus Information PLMS LMs Index  Total LMs during PLMS 0 0.0  LMs w/ Microarousals 0 0.0   LM LMs Index  w/ Microarousal 0 0.0  w/ Awakening 0 0.0  w/ Resp Event 0 0.0  Spontaneous 0 0.0  Total 0 0.0

## 2023-09-03 ENCOUNTER — Encounter: Payer: Self-pay | Admitting: Neurology

## 2023-09-03 ENCOUNTER — Ambulatory Visit (INDEPENDENT_AMBULATORY_CARE_PROVIDER_SITE_OTHER): Payer: Medicare Other | Admitting: Family Medicine

## 2023-09-03 ENCOUNTER — Encounter (INDEPENDENT_AMBULATORY_CARE_PROVIDER_SITE_OTHER): Payer: Self-pay | Admitting: Family Medicine

## 2023-09-03 VITALS — BP 102/67 | HR 81 | Temp 97.5°F | Ht 64.0 in | Wt 167.0 lb

## 2023-09-03 DIAGNOSIS — G4730 Sleep apnea, unspecified: Secondary | ICD-10-CM

## 2023-09-03 DIAGNOSIS — E785 Hyperlipidemia, unspecified: Secondary | ICD-10-CM

## 2023-09-03 DIAGNOSIS — Z6828 Body mass index (BMI) 28.0-28.9, adult: Secondary | ICD-10-CM | POA: Diagnosis not present

## 2023-09-03 DIAGNOSIS — E039 Hypothyroidism, unspecified: Secondary | ICD-10-CM

## 2023-09-03 DIAGNOSIS — E669 Obesity, unspecified: Secondary | ICD-10-CM

## 2023-09-03 NOTE — Progress Notes (Signed)
.smr  Office: [phone]  /  Fax: [phone]  WEIGHT SUMMARY AND BIOMETRICS  Anthropometric Measurements Height: 5\' 4"  (1.626 m) Weight: 167 lb (75.8 kg) BMI (Calculated): 28.65 Weight at Last Visit: 171 lb Weight Lost Since Last Visit: 4 lb Weight Gained Since Last Visit: 0   Body Composition  Body Fat %: 38.5 % Fat Mass (lbs): 64.4 lbs Muscle Mass (lbs): 97.6 lbs Total Body Water (lbs): 63.8 lbs Visceral Fat Rating : 10   Other Clinical Data Fasting: No Labs: No Today's Visit #: 17    Chief Complaint: OBESITY   History of Present Illness   The patient, with a history of obesity and hyperlipidemia, presents for a follow-up visit after a three-month interval. She reports a weight loss of four pounds during this period, achieved through portion control, smart dietary choices, and a regimen of cardio, yoga, and strength training exercises. She is also on Crestor for hyperlipidemia management and is actively working on diet and weight loss to control cholesterol levels.  The patient recently underwent a five-week program under the care of a gastroenterologist in [place], which led to the cessation of Prilosec. She was transitioned to Pepcid, which she continues to take. She underwent an endoscopy and a marker study, the results of which revealed two ulcers. The patient is unsure if these ulcers were a result of discontinuing Prilosec. She has been trying to avoid triggering foods and reports feeling better in the last few days.  The patient has also been dealing with sleep apnea and has been using a CPAP machine. However, she reports struggling with the mask and believes she may need to transition to a BiPAP machine. She has an upcoming appointment with a sleep specialist to discuss this further.  In terms of thyroid health, the patient reports feeling sluggish and had her thyroid medication dosage adjusted, which has led to an improvement in energy levels. She is currently  taking a higher dose of thyroid medication on Sundays and the regular dose on other days.  The patient has made significant dietary changes, including stopping alcohol consumption since the summer and planning to reduce bread intake. She has been more conscious of her cholesterol intake and has been substituting unhealthy snacks with healthier options like nuts and dark chocolate.  Despite these improvements, the patient admits to not exercising as much as she would like and expresses a desire to build up her endurance. She reports feeling winded after climbing steep stairs and plans to gradually increase her stair climbing to improve her fitness level.          PHYSICAL EXAM:  Blood pressure 102/67, pulse 81, temperature (!) 97.5 F (36.4 C), height 5\' 4"  (1.626 m), weight 167 lb (75.8 kg), SpO2 98%. Body mass index is 28.67 kg/m.  DIAGNOSTIC DATA REVIEWED:  BMET    Component Value Date/Time   NA 138 11/14/2022 1207   NA 139 07/14/2021 0000   K 4.6 11/14/2022 1207   CL 104 11/14/2022 1207   CO2 26 11/14/2022 1207   GLUCOSE 96 11/14/2022 1207   BUN 18 11/14/2022 1207   BUN 17 07/14/2021 0000   CREATININE 0.98 11/14/2022 1207   CALCIUM 9.6 11/14/2022 1207   GFRNONAA >60 11/14/2022 1207   Lab Results  Component Value Date   HGBA1C 6.0 11/16/2022   HGBA1C 5.8 01/07/2020   Lab Results  Component Value Date   INSULIN 9.5 07/12/2021   Lab Results  Component Value Date   TSH 2.32 11/28/2021  CBC    Component Value Date/Time   WBC 7.3 11/14/2022 1207   RBC 4.35 11/14/2022 1207   HGB 11.8 (L) 11/14/2022 1207   HCT 36.5 11/14/2022 1207   PLT 195 11/14/2022 1207   MCV 83.9 11/14/2022 1207   MCH 27.1 11/14/2022 1207   MCHC 32.3 11/14/2022 1207   RDW 13.5 11/14/2022 1207   Iron Studies    Component Value Date/Time   IRON 38 (L) 11/16/2022 1636   IRON 42 02/16/2022 1528   TIBC 369.6 11/16/2022 1636   TIBC 333 02/16/2022 1528   FERRITIN 21.8 11/16/2022 1636    FERRITIN 36 02/16/2022 1528   IRONPCTSAT 10.3 (L) 11/16/2022 1636   IRONPCTSAT 13 (L) 02/16/2022 1528   IRONPCTSAT 12 (L) 09/29/2020 1336   Lipid Panel     Component Value Date/Time   CHOL 118 11/16/2022 1636   TRIG 113.0 11/16/2022 1636   HDL 50.10 11/16/2022 1636   CHOLHDL 2 11/16/2022 1636   VLDL 22.6 11/16/2022 1636   LDLCALC 46 11/16/2022 1636   Hepatic Function Panel     Component Value Date/Time   PROT 7.8 11/16/2022 1636   PROT 7.9 07/12/2021 0948   ALBUMIN 4.4 11/16/2022 1636   ALBUMIN 5.1 (H) 07/12/2021 0948   AST 18 11/16/2022 1636   ALT 11 11/16/2022 1636   ALKPHOS 95 11/16/2022 1636   BILITOT 0.3 11/16/2022 1636   BILITOT 0.4 07/12/2021 0948   BILIDIR 0.1 11/16/2022 1636      Component Value Date/Time   TSH 2.32 11/28/2021 1053   Nutritional Lab Results  Component Value Date   VD25OH 36.99 09/06/2020   VD25OH 34.95 01/07/2020     Assessment and Plan    Obesity Continued weight loss with portion control and exercise (cardio, yoga, strengthening). Lost 4 pounds in the last three months. -Continue current diet and exercise regimen.  Hyperlipidemia On Crestor. Also working on diet and weight loss to control cholesterol levels. -Continue Crestor. -Continue diet and exercise regimen.  Gastroesophageal Reflux Disease (GERD) Recently switched from Prilosec to Pepcid. Underwent endoscopy and found to have ulcers. -Continue Pepcid until follow-up with gastroenterologist.  Sleep Apnea Struggling with CPAP mask. Recent titration testing suggests need for BiPAP. -Discuss starting BiPAP with sleep specialist.  Hypothyroidism Recent increase in thyroid medication due to feeling sluggish. Now on on Sundays and on other days. -Continue current thyroid medication regimen.  Follow-up in 10 weeks to assess progress.         I have personally spent 40 minutes total time today in preparation, patient care, and documentation for this visit,  including the following: review of clinical lab tests; review of medical tests/procedures/services.    She was informed of the importance of frequent follow up visits to maximize her success with intensive lifestyle modifications for her multiple health conditions.    Quillian Quince, MD

## 2023-09-05 ENCOUNTER — Encounter: Payer: Self-pay | Admitting: Neurology

## 2023-09-05 ENCOUNTER — Ambulatory Visit (INDEPENDENT_AMBULATORY_CARE_PROVIDER_SITE_OTHER): Payer: Medicare Other | Admitting: Neurology

## 2023-09-05 VITALS — BP 110/72 | HR 72 | Ht 64.0 in | Wt 170.0 lb

## 2023-09-05 DIAGNOSIS — G4730 Sleep apnea, unspecified: Secondary | ICD-10-CM | POA: Diagnosis not present

## 2023-09-05 DIAGNOSIS — Z87730 Personal history of (corrected) cleft lip and palate: Secondary | ICD-10-CM

## 2023-09-05 DIAGNOSIS — G4733 Obstructive sleep apnea (adult) (pediatric): Secondary | ICD-10-CM

## 2023-09-05 DIAGNOSIS — Q388 Other congenital malformations of pharynx: Secondary | ICD-10-CM | POA: Diagnosis not present

## 2023-09-05 NOTE — Telephone Encounter (Signed)
Pt made aware of her titration study results and will move forward with BiPAP machine. I have forwarded the updated order to [facility].

## 2023-09-05 NOTE — Progress Notes (Signed)
Provider:  Melvyn Novas, MD  Primary Care Physician:  Nestor Ramp, MD [address] [place] [address]     Referring Provider: Nestor Ramp, Md [address] [place],  [place] [address]          Chief Complaint according to patient   Patient presents with:     New Patient (Initial Visit)           HISTORY OF PRESENT ILLNESS:  Kim Kim is a 67 y.o. female patient who is here for revisit 09/05/2023 for  follow up on PSG titration. She just returned in October from [place] and has been seen [facility] again, Dr Nelly Rout , and underwent endoscopy- this was for GI problems.  Chief concern according to patient :  " I just struggle with my CPAP and the mask,  wonder if it would be better with BiPAP".  READING PHYSICIAN: Melvyn Novas, MD Ordering : Shawnie Dapper, NP. Primary Neurologist , Dr Marjory Lies.   SCORING TECHNICIAN: Margaretann Loveless, RPSGT    HISTORY:  Kim Kim is a 67 y.o. 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 NP revisit on 02/12/2023 as the patient had transitioned to Medicare, she needed a new order for a sleep study- order had expired. PSG took finally place in [id number] and let to diagnosis of moderate OSA, hypoxia, not unexpected in a cleft palate patient. CPAP tolerated, but high residual AHI and high air leak were reported. Titration study was ordered by NP Lomax for this reason.      Kim Kim has a cleft palate. She reports a hx of tonsil stones and rhinitis. Nasal speech. No snoring. She has sometimes insomnia, but only 1-4 days a months. Not a problem to go to sleep but stay asleep. Sleep hygiene and sleep routines were addressed.   Pt has last seen Dr. Marjory Lies 12-19-2022 for  TLE, has followed with Dr Marjory Lies yearly.  Epilepsy: Dx in [place], at [facility].      The Epworth Sleepiness Scale was 2/ 24 points   FSS endorsed at 11/ 63 points.      IMPRESSION: 1) OSA in a patient with cleft palate, who had surgery at age 28-6 (Palatoplasty- soft palate was altered, no bone transplant), her cleft palate was only covered by soft tissue.  She cannot snore ! She can also not tolerate nasal interfaces for CPAP and started with a ResMedAirfit F30i in small size.  2) CPAP did control OSA while the patient slept on her side, but not in supine sleep. BiPAP also barely controlled OSA in supine- but the patient slept unfragmented and without waking periods in supine.  It is therefor needed to reduce the AHI in supine position as the patient is not getting more quality sleep in another position.    RECOMMENDATIONS: ResMedAirfit F30i in small size.  BiPAP at 17/13 cm water pressure was the best tolerated and provided the most effective treatment. A higher air leak is expected in the setting of FFM use and challenging oral palate anatomy. Residual AHI in supine was 6.92/h, all other pressure settings were associated with higher supine sleep AHIs.    Total = 3/ 24 points   FSS endorsed at 22/ 63 points.   Social History   Socioeconomic History   Marital status: Married    Spouse name: Gene   Number of children: 3  Years of education: Not on file   Highest education level: Associate degree: occupational, Scientist, product/process development, or vocational program  Occupational History    Comment: NA  Tobacco Use   Smoking status: Never    Passive exposure: Never   Smokeless tobacco: Never  Vaping Use   Vaping status: Never Used  Substance and Sexual Activity   Alcohol use: Not Currently   Drug use: Never   Sexual activity: Yes    Partners: Male    Birth control/protection: Post-menopausal  Other Topics Concern   Not on file  Social History Narrative   She recently moved from [place]. 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   Caffeine- coffee maybe 1 c, some tea   Social Determinants of Health   Financial Resource Strain: Not on file   Food Insecurity: Not on file  Transportation Needs: Not on file  Physical Activity: Not on file  Stress: Not on file  Social Connections: Not on file    Family History  Problem Relation Age of Onset   Hypertension Mother    Heart disease Mother    Diabetes Mother    Thyroid disease Mother        hypo   Thyroid nodules Mother    Hyperlipidemia Father    Heart disease Father    Hypertension Father    Hypertension Sister    Hearing loss Sister    Hypertension Sister    Hearing loss Sister    Diabetes Sister    Hypertension Sister    Hearing loss Sister    Heart disease Brother    Esophageal cancer Neg Hx    Colon cancer Neg Hx    Colon polyps Neg Hx    Stomach cancer Neg Hx    Rectal cancer Neg Hx     Past Medical History:  Diagnosis Date   Allergy    seasonal allergies   Anemia    hx of   Carotid atherosclerosis    Epilepsy (HCC)    Gastric ulcer    hx of   GERD (gastroesophageal reflux disease)    on meds   Growth hormone deficiency (HCC)    Hiatal hernia    Hypothyroidism    hx of Hashimoto thyroiditis   Pituitary adenoma (HCC)    hx of   Seizures (HCC)    temporal lobe   Thyroid disease    on meds   Vitamin D deficiency     Past Surgical History:  Procedure Laterality Date   COLONOSCOPY  12/10/2015   [place] Lysle Morales   ESOPHAGOGASTRODUODENOSCOPY  06/03/2019   Dr Saunders [place], [place]   WISDOM TOOTH EXTRACTION       Current Outpatient Medications on File Prior to Visit  Medication Sig Dispense Refill   AMBULATORY NON FORMULARY MEDICATION Custom orthotics  Dx: plantar fasciitis 1 Units 0   desonide (DESOWEN) 0.05 % cream Apply 1 Application topically as needed. 60 g 3   famotidine (PEPCID) 40 MG tablet Take 40 mg by mouth 2 (two) times daily as needed.     ketoconazole (NIZORAL) 2 % cream Apply 1 Application topically 2 (two) times daily. To affected areas. 60 g 3   LAMICTAL 100 MG tablet Take 1 tablet (100 mg total) by mouth 2 (two)  times daily. 180 tablet 4   levothyroxine (SYNTHROID) 100 MCG tablet Take 100 mcg by mouth daily before breakfast.     rosuvastatin (CRESTOR) 10 MG tablet Take 10  mg by mouth at bedtime.     No current facility-administered medications on file prior to visit.    No Known Allergies   DIAGNOSTIC DATA (LABS, IMAGING, TESTING) - I reviewed patient records, labs, notes, testing and imaging myself where available.  Lab Results  Component Value Date   WBC 7.3 11/14/2022   HGB 11.8 (L) 11/14/2022   HCT 36.5 11/14/2022   MCV 83.9 11/14/2022   PLT 195 11/14/2022      Component Value Date/Time   NA 138 11/14/2022 1207   NA 139 07/14/2021 0000   K 4.6 11/14/2022 1207   CL 104 11/14/2022 1207   CO2 26 11/14/2022 1207   GLUCOSE 96 11/14/2022 1207   BUN 18 11/14/2022 1207   BUN 17 07/14/2021 0000   CREATININE 0.98 11/14/2022 1207   CALCIUM 9.6 11/14/2022 1207   PROT 7.8 11/16/2022 1636   PROT 7.9 07/12/2021 0948   ALBUMIN 4.4 11/16/2022 1636   ALBUMIN 5.1 (H) 07/12/2021 0948   AST 18 11/16/2022 1636   ALT 11 11/16/2022 1636   ALKPHOS 95 11/16/2022 1636   BILITOT 0.3 11/16/2022 1636   BILITOT 0.4 07/12/2021 0948   GFRNONAA >60 11/14/2022 1207   Lab Results  Component Value Date   CHOL 118 11/16/2022   HDL 50.10 11/16/2022   LDLCALC 46 11/16/2022   TRIG 113.0 11/16/2022   CHOLHDL 2 11/16/2022   Lab Results  Component Value Date   HGBA1C 6.0 11/16/2022   Lab Results  Component Value Date   VITAMINB12 >1500 (H) 11/16/2022   Lab Results  Component Value Date   TSH 2.32 11/28/2021    PHYSICAL EXAM:  Today's Vitals   09/05/23 0810  BP: 110/72  Pulse: 72  Weight: 170 lb (77.1 kg)  Height: 5\' 4"  (1.626 m)   Body mass index is 29.18 kg/m.   Wt Readings from Last 3 Encounters:  09/05/23 170 lb (77.1 kg)  09/03/23 167 lb (75.8 kg)  06/12/23 171 lb (77.6 kg)     Ht Readings from Last 3 Encounters:  09/05/23 5\' 4"  (1.626 m)  09/03/23 5\' 4"  (1.626 m)  06/12/23  5\' 4"  (1.626 m)      General:The patient is awake, alert and appears not in acute distress. The patient is well groomed. Head: Normocephalic, atraumatic. Neck is supple.  Mallampati 3,  neck circumference:13.5  inches . Nasal airflow not fully patent.  Retrognathia is not seen. Small oral opening.  Dental status: regular but small.  Cardiovascular:  Regular rate and cardiac rhythm by pulse,  without distended neck veins. Respiratory: Lungs are clear to auscultation.  Skin:  Without evidence of ankle edema, or rash. Trunk: The patient's posture is erect.   Neurologic exam : The patient is awake and alert, oriented to place and time.   Memory subjective described as intact.  Attention span & concentration ability appears normal.  Speech is fluent,  with 'nasal speech"  dysphonia.  Mood and affect are appropriate.   Cranial nerves: no loss of smell or taste reported  Pupils are equal and briskly reactive to light. Funduscopic exam deferred. Extraocular movements in vertical and horizontal planes were intact and without nystagmus. No Diplopia. Visual fields by finger perimetry are intact. Hearing was intact to soft voice and finger rubbing.   Facial sensation intact to fine touch.  Facial motor strength is symmetric and tongue and uvula move midline.  Neck ROM : rotation, tilt and flexion extension were normal for age and shoulder shrug  was symmetrical.    Motor exam:  Symmetric bulk, tone and ROM.   Normal tone without cog wheeling, symmetric grip strength .      ASSESSMENT AND PLAN 45 - year- old female  here with:    1) attempt to control sleep apnea in a patient with cleft palate, changing from CPAP to BiPAP titration in lab. The patient's primary care physician is Dr. Yetta Barre at [facility] her primary neurologist is Dr. Marjory Lies, MD and her download for her current CPAP which is an AutoPap is showing and a residual AHI of 10.9 but this majority obstructive sleep apnea.  Given that  she has the maximum pressure offering to overcome obstructive sleep apnea she has been using the 95th percentile pressure of 18.3 cm water which can be highly uncomfortable.  She has at times felt as if the air pressure is too much.  Her residual AHI is 11 consisting of 7.2 obstructive 1.8 central apneas.  The compliance is excellent 97% by days and 79% by hours.  With an average of 5-1/2 hours at night.  There are few unknown events which means that the air leak also moderate in degree, is not affecting the apnea index.    2) the outcome of the titration at the best result of a residual AHI of 7/h at an BiPAP setting of 17/13.  The patient slept 52 minutes of 52 minutes recording time so it was excellently tolerated and she reached deep sleep she slept supine for the whole titration.  So I think this is her ideal pressure but she should have room because if she does not sleep supine her apnea index is low so I would give her an out of BiPAP.  She can stay with the current mask as we have not been able to to locate a pediatric fullface mask which may be more comfortable for her.  Auto BiPAP for tolerance of PAP therapy. Setting from 12/ 8 cm through 17/ 13 cm water, and if not available here- needs setting 17/ 13 cm water .   I plan to follow up either personally or through our NP within 4-6 months.   I would like to thank Dr Marjory Lies, MD,  Nestor Ramp, Md [address] [place],  [place] [address] for allowing me to meet with and to take care of this pleasant patient.   After spending a total time of  35  minutes face to face and additional time for physical and neurologic examination, review of laboratory studies,  personal review of imaging studies, reports and results of other testing and review of referral information / records as far as provided in visit,   Electronically signed by: Melvyn Novas, MD 09/05/2023 8:40 AM  [facility] and [facility] Board  certified by The [employer] of Sleep Medicine and Diplomate of the [employer] of Sleep Medicine. Board certified In Neurology through the ABPN, Fellow of the [employer] of Neu[employer].

## 2023-09-05 NOTE — Patient Instructions (Signed)
Auto BiPAP for tolerance of PAP therapy. Setting from 12/ 8 cm through 17/ 13 cm water, and if not available here- needs setting 17/ 13 cm water .

## 2023-09-14 ENCOUNTER — Ambulatory Visit: Payer: Medicare Other | Admitting: Internal Medicine

## 2023-09-14 ENCOUNTER — Other Ambulatory Visit (HOSPITAL_BASED_OUTPATIENT_CLINIC_OR_DEPARTMENT_OTHER): Payer: Self-pay

## 2023-09-14 MED ORDER — INFLUENZA VAC A&B SURF ANT ADJ 0.5 ML IM SUSY
0.5000 mL | PREFILLED_SYRINGE | Freq: Once | INTRAMUSCULAR | 0 refills | Status: AC
  Filled 2023-09-14 – 2023-10-12 (×2): qty 0.5, 1d supply, fill #0

## 2023-10-02 ENCOUNTER — Telehealth: Payer: Self-pay | Admitting: Neurology

## 2023-10-02 NOTE — Telephone Encounter (Signed)
Dear Mrs. Delton See,   I reviewed the download of data from your current auto titration PAP machine.  I see that the residual apnea-hypopnea index, AHI, is quite high and that the residual consists more of obstructive sleep apnea than central apnea. The air leak is not that high to explain the finding.  The residual AHI is about as high as the baseline AHI in your in lab study, which is unfortunate.  You slept during your HST in supine and slept well, I would still recommend to change that position only to reduce apneas.  If you feel that your sleep is otherwise refreshing and restoring, I will only ask you to try and sleep on your side.   Melvyn Novas, MD

## 2023-10-02 NOTE — Telephone Encounter (Signed)
Pt called needing to speak to the provider about her bipap machine. Pt states that she thinks she is getting worse and is needing to discuss possibly trying different options or maybe a pressure change. Please advise.

## 2023-10-04 NOTE — Telephone Encounter (Signed)
DL below

## 2023-10-04 NOTE — Telephone Encounter (Signed)
I called patient. I had an extended call with her. She is quite concerned that her AHI is still elevated. I discussed with her the difference in central  vs obstructive osa as well as the difference in a CPAP vs BIPAP. She feels like her OSA was better controlled on CPAP. She avoids supine sleep as best as she can.  She would like Dr. Vickey Huger to review the data again and consider a pressure change on the BIPAP. She also wants to know if she needs a sooner appointment, such as next month, with Dr. Vickey Huger rather than the one scheduled for February.

## 2023-10-04 NOTE — Telephone Encounter (Signed)
Pt calling discuss results getting with the new machine. My breathing has not improved, having more breathing event.

## 2023-10-10 ENCOUNTER — Telehealth: Payer: Self-pay | Admitting: Neurology

## 2023-10-10 NOTE — Telephone Encounter (Signed)
Pt has called asking for a call from [place], [place] to discuss the renewal process  of LAMICTAL 100 MG tablet  for year 2025, please call pt to discuss.

## 2023-10-10 NOTE — Telephone Encounter (Addendum)
I called the patient to see if there is anything I can help with since Baird Lyons is not here today. Patient said Baird Lyons is most familiar with her situation. She is inquiring about PA renewal for 2025 for the brand Lamictal. She states Rx was sent most recently to [facility] since its cheaper. She also mentioned that a tier reduction request was submitted earlier this year (was approved by [facility]). Pt states her insurance will not be changing next year. She has Medicare plus [employer] supplement plan G. She stated this is not urgent but she wanted to check on it since we're in December. She also asked if there could be an auth done for a 3 year term again as it has been before.

## 2023-10-10 NOTE — Telephone Encounter (Signed)
Pt had a request to r/s an appointment and to make a request about a medication.

## 2023-10-12 ENCOUNTER — Other Ambulatory Visit (HOSPITAL_BASED_OUTPATIENT_CLINIC_OR_DEPARTMENT_OTHER): Payer: Self-pay

## 2023-10-12 MED ORDER — INFLUENZA VIRUS VACC SPLIT PF (FLUZONE) 0.5 ML IM SUSY
0.5000 mL | PREFILLED_SYRINGE | Freq: Once | INTRAMUSCULAR | 0 refills | Status: AC
  Filled 2023-10-12: qty 0.5, 1d supply, fill #0

## 2023-10-12 MED ORDER — COVID-19 MRNA VAC-TRIS(PFIZER) 30 MCG/0.3ML IM SUSY
0.3000 mL | PREFILLED_SYRINGE | Freq: Once | INTRAMUSCULAR | 0 refills | Status: AC
  Filled 2023-10-12: qty 0.3, 1d supply, fill #0

## 2023-10-15 ENCOUNTER — Ambulatory Visit: Payer: BC Managed Care – PPO | Admitting: Diagnostic Neuroimaging

## 2023-10-23 ENCOUNTER — Telehealth: Payer: Self-pay

## 2023-10-23 ENCOUNTER — Other Ambulatory Visit (HOSPITAL_COMMUNITY): Payer: Self-pay

## 2023-10-23 NOTE — Telephone Encounter (Signed)
  Insurance is requesting a letter form provider explaining the above information in order to get a tier exception. Thanks.

## 2023-10-23 NOTE — Telephone Encounter (Signed)
PA request has been  awaiting feedback from [employer] . New Encounter created for follow up. For additional info see Pharmacy Prior Auth telephone encounter from 10/23/2023.

## 2023-10-26 ENCOUNTER — Encounter: Payer: Self-pay | Admitting: Neurology

## 2023-10-30 ENCOUNTER — Other Ambulatory Visit: Payer: Self-pay | Admitting: Neurology

## 2023-10-30 MED ORDER — LAMICTAL 100 MG PO TABS: 100.0000 mg | ORAL_TABLET | Freq: Two times a day (BID) | ORAL | 2 refills | Status: DC

## 2023-10-30 NOTE — Telephone Encounter (Signed)
 patient has tried and failed generic lamictal  and had break through seizures so she was switched back to brand name Lamictal . She has remained stable on the brand name Lamictal  and should continue on the brand name. Changing the patient to a different medication could cause her to have break through seizures.

## 2023-10-30 NOTE — Telephone Encounter (Signed)
   Tier exception submitted-awaiting determination.

## 2023-11-05 NOTE — Telephone Encounter (Signed)
 Pharmacy Patient Advocate Encounter  Received notification from [employer] that Prior Authorization for LAMICTAL  100MG  TABLET has been DENIED.  Full denial letter will be uploaded to the media tab. See denial reason below.   PA #/Case ID/Reference #: [id number] Enrollee ID Number

## 2023-11-06 ENCOUNTER — Ambulatory Visit (INDEPENDENT_AMBULATORY_CARE_PROVIDER_SITE_OTHER): Payer: Medicare Other | Admitting: Family Medicine

## 2023-11-10 NOTE — Progress Notes (Signed)
 Cardiology Office Note:  .   Date:  11/12/2023  ID:  Kim Kim, DOB 29-Oct-1956, MRN [id number] PCP: Rosalynn Camie CROME, MD  [facility] Providers Cardiologist:  None   History of Present Illness: .   Kim Kim is a 68 y.o. GERD, B12 deficiency, hiatal hernia, PUD with gastric ulcer in 2020 due to NSAID, history of pituitary adenoma, seizure disorder (temporal lobe epilepsy), history of Hashimoto's thyroiditis with subsequent hypothyroidism, hyperlipidemia, prediabetes mellitus, family history of premature coronary artery disease, exertional chest pain suggestive of angina but has had extensive cardiac workup in the past and has been negative, coronary CTA normal, coronary calcium score 0 and cardiopulmonary stress test except for exercise-induced LBBB and chest pain, had good exercise tolerance. GI workup has been negative.  Discussed the use of AI scribe software for clinical note transcription with the patient, who gave verbal consent to proceed.  History of Present Illness   The patient, with a history of a vasovagal episode, presents with concerns about returning to exercise due to fear of fainting. She reports that she has not been working out since the episode and has noticed increased fatigue, particularly when climbing stairs. She also mentions that she has been traveling frequently, which may have contributed to her lack of exercise.  In addition to her concerns about exercise and fatigue, the patient also discusses ongoing digestive issues. She has been seeing an integrative medicine doctor and a gastroenterologist for these issues. She was previously on Prilosec , which she was able to discontinue, but recent endoscopy revealed ulcers and irritation, leading to a recommendation to restart Prilosec  for three months. She also underwent a capsule endoscopy and a test for muscle sphincter tone, which revealed slow transit and loss of muscle tone in the pelvic floor, respectively.  The  patient also mentions concerns about her husband's health, specifically his cholesterol levels and potential for heart disease. She reports that he has started Repatha and is considering starting a weight loss medication.       Labs   Lab Results  Component Value Date   CHOL 118 11/16/2022   HDL 50.10 11/16/2022   LDLCALC 46 11/16/2022   TRIG 113.0 11/16/2022   CHOLHDL 2 11/16/2022   Lab Results  Component Value Date   NA 138 11/14/2022   K 4.6 11/14/2022   CO2 26 11/14/2022   GLUCOSE 96 11/14/2022   BUN 18 11/14/2022   CREATININE 0.98 11/14/2022   CALCIUM 9.6 11/14/2022   EGFR 64 07/12/2021   GFRNONAA >60 11/14/2022      Latest Ref Rng & Units 11/14/2022   12:07 PM 07/14/2021   12:00 AM 07/12/2021    9:48 AM  BMP  Glucose 70 - 99 mg/dL 96   90   BUN 8 - 23 mg/dL 18  17     16    Creatinine 0.44 - 1.00 mg/dL 9.01  0.9     9.00   BUN/Creat Ratio 12 - 28   16   Sodium 135 - 145 mmol/L 138  139     140   Potassium 3.5 - 5.1 mmol/L 4.6  4.2     4.6   Chloride 98 - 111 mmol/L 104  104     98   CO2 22 - 32 mmol/L 26  26     26    Calcium 8.9 - 10.3 mg/dL 9.6   9.8      This result is from an external source.  Latest Ref Rng & Units 11/14/2022   12:07 PM 09/29/2020    1:36 PM 01/07/2020   11:19 AM  CBC  WBC 4.0 - 10.5 K/uL 7.3  7.1  7.0   Hemoglobin 12.0 - 15.0 g/dL 88.1  87.9  87.4   Hematocrit 36.0 - 46.0 % 36.5  36.1  37.6   Platelets 150 - 400 K/uL 195  242.0  250.0    Lab Results  Component Value Date   HGBA1C 6.0 11/16/2022    Lab Results  Component Value Date   TSH 2.32 11/28/2021    Review of Systems  Cardiovascular:  Negative for chest pain, dyspnea on exertion and leg swelling.   Physical Exam:   VS:  BP 112/72 (BP Location: Left Arm, Patient Position: Sitting, Cuff Size: Large)   Pulse 73   Resp 16   Ht 5' 4 (1.626 m)   Wt 78.9 kg   SpO2 97%   BMI 29.87 kg/m    Wt Readings from Last 3 Encounters:  11/12/23 78.9 kg  09/05/23 77.1 kg   09/03/23 75.8 kg     Physical Exam Neck:     Vascular: No carotid bruit or JVD.  Cardiovascular:     Rate and Rhythm: Normal rate and regular rhythm.     Pulses: Intact distal pulses.     Heart sounds: Normal heart sounds. No murmur heard.    No gallop.  Pulmonary:     Effort: Pulmonary effort is normal.     Breath sounds: Normal breath sounds.  Abdominal:     General: Bowel sounds are normal.     Palpations: Abdomen is soft.  Musculoskeletal:     Right lower leg: No edema.     Left lower leg: No edema.     Studies Reviewed: SABRA    Coronary CT angiogram 12/23/2018: Normal coronary arteries. Right dominant circulation. Coronary calcium score not done. (Calcium score of 0 on 11/13/2017).  Cardiopulmonary stress test 01/13/2021: Exercise testing with gas exchange indicates a normal functional capacity when compared to matched sedentary norms. There is no clear cardiopulmonary limitation. VE/VCO2 slope is slightly elevated and could indicate increased pulmonary pressures during exercise. Note exercise induced LBBB. Normal BP response.    Exercise Time:    11:45   Speed (mph): 3.0       Grade (%): 15.0    RPE: 17  Reason stopped: Chest pain (9/10).  Additional symptoms: Dyspnea (3/10)   Echocardiogram 01/02/2023: 1. Left ventricular ejection fraction, by estimation, is 60 to 65%. The left ventricle has normal function. The left ventricle has no regional wall motion abnormalities. Left ventricular diastolic parameters were normal. 2. Right ventricular systolic function is normal. The right ventricular size is normal. There is normal pulmonary artery systolic pressure. The estimated right ventricular systolic pressure is 15.0 mmHg. 3. The mitral valve is normal in structure. Trivial mitral valve regurgitation. No evidence of mitral stenosis. 4. The aortic valve is normal in structure. Aortic valve regurgitation is not visualized. No aortic stenosis is present. 5. The inferior vena cava  is normal in size with greater than 50% respiratory variability, suggesting right atrial pressure of 3 mmHg.  6.  No significant change from 02/12/2019.  Exercise treadmill stress test 01/08/2023: Exercise treadmill stress test performed using Bruce protocol.  Patient reached 7.6 METS, and 101% of age predicted maximum heart rate.  Exercise capacity was fair.  No chest pain reported.  Normal heart rate and hemodynamic response. Stress EKG revealed no  ischemic changes. Low risk study.  Zio Patch Extended out patient EKG monitoring 14 days starting 01/08/2023: Predominant Rhythm : Normal sinus rhythm min HR: 51. Max HR 133 bpm Atrial arrhythmias: Rare PACs Atrial fibrillation: No atrial fibrillation Ventricular arrhythmias: Rare PVCs. PVC Burden <0.1% Heart Block: None Symptoms: No symptoms reported.  Unremarkable study.    EKG:    EKG Interpretation Date/Time:  Monday November 12 2023 08:12:43 EST Ventricular Rate:  73 PR Interval:  178 QRS Duration:  78 QT Interval:  378 QTC Calculation: 416 R Axis:   -2  Text Interpretation: EKG 11/12/2023: Normal sinus rhythm at rate of 73 bpm, normal EKG.  Compared to 11/14/2022, no significant change. Confirmed by Quinn Bartling, Jagadeesh [phone]) on 11/12/2023 8:21:05 AM    EKG 11/16/2022: Normal sinus rhythm at a rate of 70 bpm, incomplete right bundle branch block.  Normal EKG.  No change from 07/12/2021.  Medications and allergies    No Known Allergies   Current Outpatient Medications:    AMBULATORY NON FORMULARY MEDICATION, Custom orthotics  Dx: plantar fasciitis, Disp: 1 Units, Rfl: 0   desonide  (DESOWEN ) 0.05 % cream, Apply 1 Application topically as needed., Disp: 60 g, Rfl: 3   famotidine (PEPCID) 40 MG tablet, Take 40 mg by mouth 2 (two) times daily as needed., Disp: , Rfl:    ketoconazole  (NIZORAL ) 2 % cream, Apply 1 Application topically 2 (two) times daily. To affected areas., Disp: 60 g, Rfl: 3   LAMICTAL  100 MG tablet, Take 1 tablet (100 mg  total) by mouth 2 (two) times daily., Disp: 180 tablet, Rfl: 2   levothyroxine (SYNTHROID ) 100 MCG tablet, Take 100 mcg by mouth daily before breakfast., Disp: , Rfl:    omeprazole  (PRILOSEC ) 20 MG capsule, Take 1 capsule by mouth daily., Disp: , Rfl:    rosuvastatin (CRESTOR) 10 MG tablet, Take 10 mg by mouth at bedtime., Disp: , Rfl:    ASSESSMENT AND PLAN: .      ICD-10-CM   1. Angina pectoris with normal coronary arteriogram (HCC)  I20.9 EKG 12-Lead    2. Pure hypercholesterolemia  E78.00       1. Angina pectoris with normal coronary arteriogram [facility]) Patient since being on Crestor, she has not had any further episodes of angina pectoris.  She is presently asymptomatic.  She has noticed some mild decreased exercise tolerance due to lack of physical activity which I encouraged her to do.  2. Pure hypercholesterolemia Reviewed her labs from 6 months ago, lipids in excellent control, she is presently tolerating Crestor 10 mg daily.  Last year when I seen her she had an episode of syncope which appears to be vasovagal.  I encouraged her to continue to exercise and to let me know if she has any recurrence.  Otherwise I will see her back on a as needed basis.  Assessment and Plan    Vasovagal Syncope No recurrence of syncope since last episode. Patient has been hesitant to resume exercise due to fear of recurrence. -Encouraged to resume exercise. Reassured that the previous episode of syncope should not recur with exercise.  Gastrointestinal Issues Patient reports having ulcers and irritation found on endoscopy. Currently on Prilosec  for treatment. -Continue Prilosec  as directed by gastroenterologist. Follow-up with gastroenterologist in March.  Hyperlipidemia Patient is on Crestor 10mg  for prevention. Last labs showed well-controlled cholesterol. -Continue Crestor 10mg  daily.  General Health Maintenance Patient reports weight stability but acknowledges need for exercise and weight  management to prevent diabetes. -Encouraged  to resume exercise and maintain healthy diet for weight management and diabetes prevention.      Signed,  Gordy Bergamo, MD, [facility] 11/12/2023, 8:33 AM [facility] [address] [place], [place] [address] Phone: [phone]. Fax:  [phone]

## 2023-11-12 ENCOUNTER — Ambulatory Visit: Payer: Medicare Other | Attending: Cardiology | Admitting: Cardiology

## 2023-11-12 ENCOUNTER — Encounter: Payer: Self-pay | Admitting: Cardiology

## 2023-11-12 VITALS — BP 112/72 | HR 73 | Resp 16 | Ht 64.0 in | Wt 174.0 lb

## 2023-11-12 DIAGNOSIS — I209 Angina pectoris, unspecified: Secondary | ICD-10-CM | POA: Diagnosis not present

## 2023-11-12 DIAGNOSIS — E7800 Pure hypercholesterolemia, unspecified: Secondary | ICD-10-CM | POA: Diagnosis not present

## 2023-11-12 NOTE — Patient Instructions (Signed)

## 2023-11-13 NOTE — Telephone Encounter (Signed)
 Appeal request has been sent to our Pharmacist for review for the appeal process.

## 2023-11-26 NOTE — Progress Notes (Deleted)
Kim Kim

## 2023-11-27 ENCOUNTER — Ambulatory Visit: Payer: BC Managed Care – PPO | Admitting: Family Medicine

## 2023-12-03 NOTE — Progress Notes (Signed)
 Chief Complaint  Patient presents with   Obstructive Sleep Apnea    Rm 1 alone Pt is well and stable, reports no OSA or new sz concerns since last visit.     HISTORY OF PRESENT ILLNESS:  12/05/23 ALL:  Kim Kim returns for follow up for OSA on BiPAP and temporal lobe epilepsy. She was switched to BiPAP therapy 08/2023 due to high residual AHI on CPAP. Pressure adjustments last made 10/30/2023.   Since, she reports continuing to adjust to therapy but feeling better with time. She saw a provider at a hospital in [place]  and was given the F20 FFM memory foam in small. She is much happier with this mask. She continues to have dry mouth. She recently started using Biotene. She does report waking up around 2-3am. She is usually able to go back to sleep.   She continues Lamictal  (Brand medically necessary) 100mg  BID for seizure management. She denies recent seizure activity. She drives without difficulty.     06/12/23 ALL (Mychart): Kim Kim is a 68 y.o. female here today for follow up for recently diagnosed OSA. She was seen in consult with Dr Chalice 07/2022. Sleep study advised but not performed in time and revisit required 01/2023. She had PSG 02/2023 showing OSA with AHI 23.8/h and O2 nadir of 54%. AutoPAP advised. Pressure setting of 6-16 initially but increased to 6-20 following review with elevated AHI of 17/hr. Since, she reports doing fairly well on therapy. She continues to adjust to therapy. She has tried multiple mask but feels she is doing best with small air flow FFM. She may feel a little better rested but, otherwise, has not noted any significant benefit of use. She is trying to work on sleep hygiene. She was having a hard time with getting enough sleep. She is trying to go to bed and wake up around the same times, every day. She denies concerns with machine or supplies.   She has follow up with Dr Margaret 09/2023 for temporal lobe epilepsy. She continues Lamictal  (Brand)  100mg  BID. She lives in [place]  part of the year. Followed by Dr Primus.     10/03/2021 ALL:  Kim Kim is a 68 y.o. female here today for follow up for temporal lope epilepsy. She continues Lamictal  (brand) 100mg  BID. She continues to do well. No deja vu episodes. No auras. No unusual dreams. She is tolerating meds well. She has only taken Brand Lamictal  in the past. She reports mood changes on topiramate.   HISTORY (copied from Dr Chancy previous note)  UPDATE (12/30/19, VRP): Since last visit, doing well. Some reports of memory loss. Some stress. Also asking about intermittent eye pressure issues when laying face down with massages. No alleviating or aggravating factors. Tolerating lamictal  100mg  twice a day. Mild spells have resolved.     PRIOR HPI: 68 year old female here for evaluation of temporal procedures.   In patient's 20s or 30s she was having recurrent dream, feeling like she was a little girl a swing in an old house.  Patient was having intermittent episodes of this strange feeling in her head which she had a hard time describing.  Sometimes she would feel lightheadedness and faint.  In her 30s she had an episode at home where she almost passed out.  In 2007 she had an episode where apparently she turned pale in color and almost fainted.  No dj vu sensations.  She had 1 out of body sensation in high school.   In 2013  patient went to see a neurologist, had EEG which showed some left temporal lobe dysfunction but no clear epileptiform discharges.  Patient was started on topiramate but this caused personality changes and side effects.  She saw another neurologist who started patient on lamotrigine  and this seemed to help suppress these episodes.  Patient did well for many years.  She had gone from having 2-3 episodes a month down to no episodes.   In 2020 patient moved from [place]  to [place]  and was under increased stress during Covid pandemic and relocation.  In the last  few months she has noticed some mild strain sensations in her head which remind her of her prior spells.  She feels a lightheadedness and strangeness in her head.  No convulsions or loss of consciousness.  Patient has been stable on lamotrigine  75 in the morning and 100 at night.  Now patient having approximately 2-3 episodes per week.  Episodes can last a few minutes at a time.   No prior head trauma, meningitis or encephalitis.  No family history of seizures.   REVIEW OF SYSTEMS: Out of a complete 14 system review of symptoms, the patient complains only of the following symptoms, insomnia and all other reviewed systems are negative.  ESS 1/24   ALLERGIES: No Known Allergies   HOME MEDICATIONS: Outpatient Medications Prior to Visit  Medication Sig Dispense Refill   desonide  (DESOWEN ) 0.05 % cream Apply 1 Application topically as needed. 60 g 3   levothyroxine (SYNTHROID ) 100 MCG tablet Take 100 mcg by mouth daily before breakfast.     omeprazole  (PRILOSEC ) 20 MG capsule Take 20 mg by mouth daily.     rosuvastatin (CRESTOR) 10 MG tablet Take 10 mg by mouth at bedtime.     LAMICTAL  100 MG tablet Take 1 tablet (100 mg total) by mouth 2 (two) times daily. 180 tablet 2   AMBULATORY NON FORMULARY MEDICATION Custom orthotics  Dx: plantar fasciitis (Patient not taking: Reported on 12/05/2023) 1 Units 0   famotidine (PEPCID) 40 MG tablet Take 40 mg by mouth 2 (two) times daily as needed.     ketoconazole  (NIZORAL ) 2 % cream Apply 1 Application topically 2 (two) times daily. To affected areas. (Patient not taking: Reported on 12/05/2023) 60 g 3   No facility-administered medications prior to visit.     PAST MEDICAL HISTORY: Past Medical History:  Diagnosis Date   Allergy    seasonal allergies   Anemia    hx of   Carotid atherosclerosis    Epilepsy (HCC)    Gastric ulcer    hx of   GERD (gastroesophageal reflux disease)    on meds   Growth hormone deficiency (HCC)    Hiatal hernia     Hypothyroidism    hx of Hashimoto thyroiditis   Pituitary adenoma (HCC)    hx of   Seizures (HCC)    temporal lobe   Thyroid  disease    on meds   Vitamin D deficiency      PAST SURGICAL HISTORY: Past Surgical History:  Procedure Laterality Date   COLONOSCOPY  12/10/2015   [place]  Dr Michelina   ESOPHAGOGASTRODUODENOSCOPY  06/03/2019   Dr Marolyn [place], [place]   WISDOM TOOTH EXTRACTION       FAMILY HISTORY: Family History  Problem Relation Age of Onset   Hypertension Mother    Heart disease Mother    Diabetes Mother    Thyroid  disease Mother        hypo  Thyroid  nodules Mother    Hyperlipidemia Father    Heart disease Father    Hypertension Father    Hypertension Sister    Hearing loss Sister    Hypertension Sister    Hearing loss Sister    Diabetes Sister    Hypertension Sister    Hearing loss Sister    Heart disease Brother    Esophageal cancer Neg Hx    Colon cancer Neg Hx    Colon polyps Neg Hx    Stomach cancer Neg Hx    Rectal cancer Neg Hx      SOCIAL HISTORY: Social History   Socioeconomic History   Marital status: Married    Spouse name: Gene   Number of children: 3   Years of education: Not on file   Highest education level: Associate degree: occupational, scientist, product/process development, or vocational program  Occupational History    Comment: NA  Tobacco Use   Smoking status: Never    Passive exposure: Never   Smokeless tobacco: Never  Vaping Use   Vaping status: Never Used  Substance and Sexual Activity   Alcohol use: Not Currently   Drug use: Never   Sexual activity: Yes    Partners: Male    Birth control/protection: Post-menopausal  Other Topics Concern   Not on file  Social History Narrative   She recently moved from [place] . 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   Caffeine- coffee maybe 1 c, some tea   Social Drivers of Corporate Investment Banker Strain: Not on file  Food Insecurity: Not on file  Transportation  Needs: Not on file  Physical Activity: Not on file  Stress: Not on file  Social Connections: Not on file  Intimate Partner Violence: Not on file     PHYSICAL EXAM  Vitals:   12/05/23 1411  BP: 132/84  Pulse: 74  Weight: 178 lb (80.7 kg)    Body mass index is 30.55 kg/m.  Generalized: Well developed, in no acute distress  Neurological examination  Mentation: Alert oriented to time, place, history taking. Follows all commands speech and language fluent Cranial nerve II-XII: Pupils were equal round reactive to light. Extraocular movements were full, visual field were full on confrontational test. Facial sensation and strength were normal. Uvula tongue midline. Head turning and shoulder shrug  were normal and symmetric. Motor: The motor testing reveals 5 over 5 strength of all 4 extremities. Good symmetric motor tone is noted throughout.  Sensory: Sensory testing is intact to soft touch on all 4 extremities. No evidence of extinction is noted.  Coordination: Cerebellar testing reveals good finger-nose-finger and heel-to-shin bilaterally.  Gait and station: Gait is normal.    DIAGNOSTIC DATA (LABS, IMAGING, TESTING) - I reviewed patient records, labs, notes, testing and imaging myself where available.  Lab Results  Component Value Date   WBC 7.3 11/14/2022   HGB 11.8 (L) 11/14/2022   HCT 36.5 11/14/2022   MCV 83.9 11/14/2022   PLT 195 11/14/2022      Component Value Date/Time   NA 138 11/14/2022 1207   NA 139 07/14/2021 0000   K 4.6 11/14/2022 1207   CL 104 11/14/2022 1207   CO2 26 11/14/2022 1207   GLUCOSE 96 11/14/2022 1207   BUN 18 11/14/2022 1207   BUN 17 07/14/2021 0000   CREATININE 0.98 11/14/2022 1207   CALCIUM 9.6 11/14/2022 1207   PROT 7.8 11/16/2022 1636   PROT 7.9 07/12/2021 0948  ALBUMIN 4.4 11/16/2022 1636   ALBUMIN 5.1 (H) 07/12/2021 0948   AST 18 11/16/2022 1636   ALT 11 11/16/2022 1636   ALKPHOS 95 11/16/2022 1636   BILITOT 0.3 11/16/2022 1636    BILITOT 0.4 07/12/2021 0948   GFRNONAA >60 11/14/2022 1207   Lab Results  Component Value Date   CHOL 118 11/16/2022   HDL 50.10 11/16/2022   LDLCALC 46 11/16/2022   TRIG 113.0 11/16/2022   CHOLHDL 2 11/16/2022   Lab Results  Component Value Date   HGBA1C 6.0 11/16/2022   Lab Results  Component Value Date   VITAMINB12 >1500 (H) 11/16/2022   Lab Results  Component Value Date   TSH 2.32 11/28/2021        No data to display               No data to display           ASSESSMENT AND PLAN  68 y.o. year old female  has a past medical history of Allergy, Anemia, Carotid atherosclerosis, Epilepsy (HCC), Gastric ulcer, GERD (gastroesophageal reflux disease), Growth hormone deficiency (HCC), Hiatal hernia, Hypothyroidism, Pituitary adenoma (HCC), Seizures (HCC), Thyroid  disease, and Vitamin D deficiency. here with    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HCC)  OSA treated with BiPAP - Plan: For home use only DME Bipap   Haylynn is doing well, today. She denies seizure like events. She will continue Lamictal  (brand name medically necessary) 100mg  BID. BiPAP report shows excellent compliance. AHI is improving. Now 7/hr. I have encouraged her to continue using therapy nightly for at least 4 hours. May continue Biotene and adjust humidity settings for comfort. She has an appt with Dr Chalice tomorrow, 12/06/2023, and advised it was not needed. She requests to keep appt and wishes to see Dr Chalice. She will continue healthy lifestyle habits. Follow up with me in 1 year.    Orders Placed This Encounter  Procedures   For home use only DME Bipap    Supplies, heated humidity and all supplies as needed    Length of Need:   Lifetime    Inspiratory pressure:   OTHER SEE COMMENTS    Expiratory pressure:   OTHER SEE COMMENTS      Meds ordered this encounter  Medications   LAMICTAL  100 MG tablet    Sig: Take 1 tablet (100 mg total) by mouth 2 (two) times daily.    Dispense:  180  tablet    Refill:  3    Supervising Provider:   INES ONETHA NOVAK [[id number]]       Greig Forbes, MSN, FNP-C 12/05/2023, 2:38 PM  [facility] [address], [address] [place], [place] [address] [phone]

## 2023-12-03 NOTE — Patient Instructions (Addendum)
 Please continue using your BiPAP regularly. While your insurance requires that you use BiPAP at least 4 hours each night on 70% of the nights, I recommend, that you not skip any nights and use it throughout the night if you can. Getting used to BiPAP and staying with the treatment long term does take time and patience and discipline. Untreated obstructive sleep apnea when it is moderate to severe can have an adverse impact on cardiovascular health and raise her risk for heart disease, arrhythmias, hypertension, congestive heart failure, stroke and diabetes. Untreated obstructive sleep apnea causes sleep disruption, nonrestorative sleep, and sleep deprivation. This can have an impact on your day to day functioning and cause daytime sleepiness and impairment of cognitive function, memory loss, mood disturbance, and problems focussing. Using BiPAP regularly can improve these symptoms.  We will update supply orders, today. Continue Lamictal  100mg  twice daily.   Follow up in 1 year

## 2023-12-04 NOTE — Progress Notes (Signed)
 Marland Kitchen

## 2023-12-05 ENCOUNTER — Ambulatory Visit (INDEPENDENT_AMBULATORY_CARE_PROVIDER_SITE_OTHER): Payer: Medicare Other | Admitting: Family Medicine

## 2023-12-05 ENCOUNTER — Encounter: Payer: Self-pay | Admitting: Family Medicine

## 2023-12-05 VITALS — BP 132/84 | HR 74 | Wt 178.0 lb

## 2023-12-05 DIAGNOSIS — G4733 Obstructive sleep apnea (adult) (pediatric): Secondary | ICD-10-CM

## 2023-12-05 DIAGNOSIS — G40109 Localization-related (focal) (partial) symptomatic epilepsy and epileptic syndromes with simple partial seizures, not intractable, without status epilepticus: Secondary | ICD-10-CM | POA: Diagnosis not present

## 2023-12-05 MED ORDER — LAMICTAL 100 MG PO TABS: 100.0000 mg | ORAL_TABLET | Freq: Two times a day (BID) | ORAL | 3 refills | Status: DC

## 2023-12-06 ENCOUNTER — Ambulatory Visit: Payer: Medicare Other | Admitting: Neurology

## 2023-12-06 ENCOUNTER — Encounter: Payer: Self-pay | Admitting: Neurology

## 2023-12-06 VITALS — BP 135/82 | HR 74 | Ht 64.0 in | Wt 178.0 lb

## 2023-12-06 DIAGNOSIS — Z87730 Personal history of (corrected) cleft lip and palate: Secondary | ICD-10-CM

## 2023-12-06 DIAGNOSIS — G4730 Sleep apnea, unspecified: Secondary | ICD-10-CM

## 2023-12-06 DIAGNOSIS — Z9989 Dependence on other enabling machines and devices: Secondary | ICD-10-CM

## 2023-12-06 NOTE — Patient Instructions (Signed)
 Biotin mouth wash , gargle for coating.   Saline nasal spray before using PAP, and once a week sinus rinse.    Quality Sleep Information, Adult Quality sleep is important for your mental and physical health. It also improves your quality of life. Quality sleep means you: Are asleep for most of the time you are in bed. Fall asleep within 30 minutes. Wake up no more than once a night. Are awake for no longer than 20 minutes if you do wake up during the night. Most adults need 7-8 hours of quality sleep each night. How can poor sleep affect me? If you do not get enough quality sleep, you may have: Mood swings. Daytime sleepiness. Decreased alertness, reaction time, and concentration. Sleep disorders, such as insomnia and sleep apnea. Difficulty with: Solving problems. Coping with stress. Paying attention. These issues may affect your performance and productivity at work, school, and home. Lack of sleep may also put you at higher risk for accidents, suicide, and risky behaviors. If you do not get quality sleep, you may also be at higher risk for several health problems, including: Infections. Type 2 diabetes. Heart disease. High blood pressure. Obesity. Worsening of long-term conditions, like arthritis, kidney disease, depression, Parkinson's disease, and epilepsy. What actions can I take to get more quality sleep? Sleep schedule and routine Stick to a sleep schedule. Go to sleep and wake up at about the same time each day. Do not try to sleep less on weekdays and make up for lost sleep on weekends. This does not work. Limit naps during the day to 30 minutes or less. Do not take naps in the late afternoon. Make time to relax before bed. Reading, listening to music, or taking a hot bath promotes quality sleep. Make your bedroom a place that promotes quality sleep. Keep your bedroom dark, quiet, and at a comfortable room temperature. Make sure your bed is comfortable. Avoid using  electronic devices that give off bright blue light for 30 minutes before bedtime. Your brain perceives bright blue light as sunlight. This includes television, phones, and computers. If you are lying awake in bed for longer than 20 minutes, get up and do a relaxing activity until you feel sleepy. Lifestyle     Try to get at least 30 minutes of exercise on most days. Do not exercise 2-3 hours before going to bed. Do not use any products that contain nicotine or tobacco. These products include cigarettes, chewing tobacco, and vaping devices, such as e-cigarettes. If you need help quitting, ask your health care provider. Do not drink caffeinated beverages for at least 8 hours before going to bed. Coffee, tea, and some sodas contain caffeine. Do not drink alcohol or eat large meals close to bedtime. Try to get at least 30 minutes of sunlight every day. Morning sunlight is best. Medical concerns Work with your health care provider to treat medical conditions that may affect sleeping, such as: Nasal obstruction. Snoring. Sleep apnea and other sleep disorders. Talk to your health care provider if you think any of your prescription medicines may cause you to have difficulty falling or staying asleep. If you have sleep problems, talk with a sleep consultant. If you think you have a sleep disorder, talk with your health care provider about getting evaluated by a specialist. Where to find more information Sleep Foundation: sleepfoundation.org American Academy of Sleep Medicine: aasm.org Centers for Disease Control and Prevention (CDC): tonerpromos.no Contact a health care provider if: You have trouble getting to  sleep or staying asleep. You often wake up very early in the morning and cannot get back to sleep. You have daytime sleepiness. You have daytime sleep attacks of suddenly falling asleep and sudden muscle weakness (narcolepsy). You have a tingling sensation in your legs with a strong urge to move  your legs (restless legs syndrome). You stop breathing briefly during sleep (sleep apnea). You think you have a sleep disorder or are taking a medicine that is affecting your quality of sleep. Summary Most adults need 7-8 hours of quality sleep each night. Getting enough quality sleep is important for your mental and physical health. Make your bedroom a place that promotes quality sleep, and avoid things that may cause you to have poor sleep, such as alcohol, caffeine, smoking, or large meals. Talk to your health care provider if you have trouble falling asleep or staying asleep. This information is not intended to replace advice given to you by your health care provider. Make sure you discuss any questions you have with your health care provider. Document Revised: 02/08/2022 Document Reviewed: 02/08/2022 Elsevier Patient Education  2024 [employer].

## 2023-12-06 NOTE — Progress Notes (Signed)
 Provider:  Dedra Gores, MD  Primary Care Physician:  Kim Camie CROME, MD [address] [place] [address]     Referring Provider: Rosalynn Camie CROME, Md [address] [place],  [place] [address]          Chief Complaint according to patient   Patient presents with:                HISTORY OF PRESENT ILLNESS:  Kim Kim is a 68 y.o. female patient who is here for revisit 12/06/2023 for follow-up on her CPAP experience.  The patient underwent an attended sleep study here and the encounter date was 08-27-2023.  I have introduced the patient below she has a history of cleft palate CPAP was tolerated but had very high residual apnea up apnea indices and a high air leak.  The titration study was ordered by nurse practitioner [place].  The patient is a primary neurology patient of Dr. Margaret for epilepsy.  She was not excessively daytime sleepy or fatigued.  She was titrated to a maximum IPAP of 25 and a minimum EPAP of 17 with 4 cm pressure support to overcome all central apnea this setting had been altered recently during her stay in her home state of [place] , and she was there also introduced to several masks and finally seems to have found someone that services are very best and her apnea hypopnea index is significantly reduced around 4/h now the new mask cut out the air leaks since 28 January this number has been much lower, and we are seeing high compliance data.  Normal minute ventilation normal respiratory rate normal tidal volume.  .  Chief concern according to patient :  [name] adjusted setitngs and masks.  She now feels more refreshed and energetic , she hasn't had this feling in years.     She was switched to foam rimmed mask FFM, ResMed AirTouch F 20 in Small .   This is what needs to be replaced every 3 months. Head gear for her every 6 months.   Settings work well.    She has a Ramp function setting.  Auto -humidity.         Review of Systems: Out of a complete 14 system review, the patient complains of only the following symptoms, and all other reviewed systems are negative.:  Fatigue, sleepiness.   How likely are you to doze in the following situations: 0 = not likely, 1 = slight chance, 2 = moderate chance, 3 = high chance   Sitting and Reading? Watching Television? Sitting inactive in a public place (theater or meeting)? As a passenger in a car for an hour without a break? Lying down in the afternoon when circumstances permit? Sitting and talking to someone? Sitting quietly after lunch without alcohol? In a car, while stopped for a few minutes in traffic?   Total = 1/ 24 points   FSS endorsed at 10/ 63 points.   Social History   Socioeconomic History   Marital status: Married    Spouse name: Gene   Number of children: 3   Years of education: Not on file   Highest education level: Associate degree: occupational, scientist, product/process development, or vocational program  Occupational History    Comment: NA  Tobacco Use   Smoking status: Never    Passive exposure: Never   Smokeless tobacco: Never  Vaping Use   Vaping status: Never Used  Substance and  Sexual Activity   Alcohol use: Not Currently   Drug use: Never   Sexual activity: Yes    Partners: Male    Birth control/protection: Post-menopausal  Other Topics Concern   Not on file  Social History Narrative   She recently moved from [place] . 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   Caffeine- coffee maybe 1 c, some tea   Social Drivers of Corporate Investment Banker Strain: Not on file  Food Insecurity: Not on file  Transportation Needs: Not on file  Physical Activity: Not on file  Stress: Not on file  Social Connections: Not on file    Family History  Problem Relation Age of Onset   Hypertension Mother    Heart disease Mother    Diabetes Mother    Thyroid  disease Mother        hypo   Thyroid  nodules Mother     Hyperlipidemia Father    Heart disease Father    Hypertension Father    Hypertension Sister    Hearing loss Sister    Hypertension Sister    Hearing loss Sister    Diabetes Sister    Hypertension Sister    Hearing loss Sister    Heart disease Brother    Esophageal cancer Neg Hx    Colon cancer Neg Hx    Colon polyps Neg Hx    Stomach cancer Neg Hx    Rectal cancer Neg Hx     Past Medical History:  Diagnosis Date   Allergy    seasonal allergies   Anemia    hx of   Carotid atherosclerosis    Epilepsy (HCC)    Gastric ulcer    hx of   GERD (gastroesophageal reflux disease)    on meds   Growth hormone deficiency (HCC)    Hiatal hernia    Hypothyroidism    hx of Hashimoto thyroiditis   Pituitary adenoma (HCC)    hx of   Seizures (HCC)    temporal lobe   Thyroid  disease    on meds   Vitamin D deficiency     Past Surgical History:  Procedure Laterality Date   COLONOSCOPY  12/10/2015   [place]  Dr Michelina   ESOPHAGOGASTRODUODENOSCOPY  06/03/2019   Dr Marolyn [place], [place]   WISDOM TOOTH EXTRACTION       Current Outpatient Medications on File Prior to Visit  Medication Sig Dispense Refill   desonide  (DESOWEN ) 0.05 % cream Apply 1 Application topically as needed. 60 g 3   LAMICTAL  100 MG tablet Take 1 tablet (100 mg total) by mouth 2 (two) times daily. 180 tablet 3   levothyroxine (SYNTHROID ) 100 MCG tablet Take 100 mcg by mouth daily before breakfast.     omeprazole  (PRILOSEC ) 20 MG capsule Take 20 mg by mouth daily.     rosuvastatin (CRESTOR) 10 MG tablet Take 10 mg by mouth at bedtime.     No current facility-administered medications on file prior to visit.    No Known Allergies   DIAGNOSTIC DATA (LABS, IMAGING, TESTING) - I reviewed patient records, labs, notes, testing and imaging myself where available.  Lab Results  Component Value Date   WBC 7.3 11/14/2022   HGB 11.8 (L) 11/14/2022   HCT 36.5 11/14/2022   MCV 83.9 11/14/2022   PLT 195  11/14/2022      Component Value Date/Time   NA 138 11/14/2022 1207   NA 139 07/14/2021 0000  K 4.6 11/14/2022 1207   CL 104 11/14/2022 1207   CO2 26 11/14/2022 1207   GLUCOSE 96 11/14/2022 1207   BUN 18 11/14/2022 1207   BUN 17 07/14/2021 0000   CREATININE 0.98 11/14/2022 1207   CALCIUM 9.6 11/14/2022 1207   PROT 7.8 11/16/2022 1636   PROT 7.9 07/12/2021 0948   ALBUMIN 4.4 11/16/2022 1636   ALBUMIN 5.1 (H) 07/12/2021 0948   AST 18 11/16/2022 1636   ALT 11 11/16/2022 1636   ALKPHOS 95 11/16/2022 1636   BILITOT 0.3 11/16/2022 1636   BILITOT 0.4 07/12/2021 0948   GFRNONAA >60 11/14/2022 1207   Lab Results  Component Value Date   CHOL 118 11/16/2022   HDL 50.10 11/16/2022   LDLCALC 46 11/16/2022   TRIG 113.0 11/16/2022   CHOLHDL 2 11/16/2022   Lab Results  Component Value Date   HGBA1C 6.0 11/16/2022   Lab Results  Component Value Date   VITAMINB12 >1500 (H) 11/16/2022   Lab Results  Component Value Date   TSH 2.32 11/28/2021    PHYSICAL EXAM:  Today's Vitals   12/06/23 0931  BP: 135/82  Pulse: 74  Weight: 178 lb (80.7 kg)  Height: 5' 4 (1.626 m)   Body mass index is 30.55 kg/m.   Wt Readings from Last 3 Encounters:  12/06/23 178 lb (80.7 kg)  12/05/23 178 lb (80.7 kg)  11/12/23 174 lb (78.9 kg)     Ht Readings from Last 3 Encounters:  12/06/23 5' 4 (1.626 m)  11/12/23 5' 4 (1.626 m)  09/05/23 5' 4 (1.626 m)      General: The patient is awake, alert and appears not in acute distress. The patient is well groomed. Head: Normocephalic, atraumatic. Neck is supple.  Mallampati 3,  neck circumference:13.5  inches . Nasal airflow not fully patent.  Retrognathia is not seen. Small oral opening.  Dental status: regular but small.  Cardiovascular:  Regular rate and cardiac rhythm by pulse,  without distended neck veins. Respiratory: Lungs are clear to auscultation.  Skin:  Without evidence of ankle edema, or rash. Trunk: The patient's posture is  erect.   Neurologic exam : The patient is awake and alert, oriented to place and time.   Memory subjective described as intact.  Attention span & concentration ability appears normal.  Speech is fluent,  with 'nasal speech  dysphonia.  Mood and affect are appropriate.   Cranial nerves: no loss of smell or taste reported  Pupils are equal and briskly reactive to light. Funduscopic exam deferred. Extraocular movements in vertical and horizontal planes were intact and without nystagmus. No Diplopia. Visual fields by finger perimetry are intact. Hearing was intact to soft voice and finger rubbing.   Facial sensation intact to fine touch.  Facial motor strength is symmetric and tongue and uvula move midline.  Neck ROM : rotation, tilt and flexion extension were normal for age and shoulder shrug was symmetrical.      ASSESSMENT AND PLAN: 68 y.o. female  here followed for sleep med only ,  with:   very high compliance to PAP therapy.     1) Attempt to control sleep apnea in a patient with cleft palate, changing from CPAP to BiPAP titration in lab.  She has finally found the best mask and best settings for her- with the help of [facility] services.  The patient's primary care physician is Dr. Joshua at [facility] her primary neurologist is Dr. Margaret, MD    I  plan to follow up either personally or through our NP within 12 months.   I would like to thank Kim Camie CROME, MD and Kim Camie CROME, Md [address] [place],  [place] [address] for allowing me to meet with and to take care of this pleasant patient.    After spending a total time of  30  minutes face to face and additional time for physical and neurologic examination, review of laboratory studies,  personal review of imaging studies, reports and results of other testing and review of referral information / records as far as provided in visit,   Electronically signed by: Kim Gores, MD 12/06/2023 9:59  AM  [facility] Board certified by The [employer] of Sleep Medicine and Diplomate of the [employer] of Sleep Medicine. Board certified In Neurology through the ABPN, Fellow of the [employer] of Neurology.

## 2023-12-06 NOTE — Progress Notes (Signed)
 Kim Kim

## 2023-12-07 NOTE — Telephone Encounter (Signed)
 Per [employer], Lamictal  is not included in the patient's formulary, a tier exception cannot be granted.

## 2023-12-17 ENCOUNTER — Ambulatory Visit (INDEPENDENT_AMBULATORY_CARE_PROVIDER_SITE_OTHER): Payer: Medicare Other | Admitting: Family Medicine

## 2023-12-17 VITALS — BP 122/82 | Ht 64.0 in | Wt 168.0 lb

## 2023-12-17 DIAGNOSIS — M7651 Patellar tendinitis, right knee: Secondary | ICD-10-CM | POA: Diagnosis present

## 2023-12-17 NOTE — Progress Notes (Unsigned)
pat  PCP: Nestor Ramp, MD  SUBJECTIVE:   HPI:  Patient is a 68 y.o. female here with chief complaint of right knee pain. Reports two weeks ago she was kneeling on right knee on her bed and felt pain, no swelling, bruising, catching, locking. Started going back to water aerobics and doing spin bike. Her pain is improving overall but still has some anterior knee pain with spin classes. Stopped after 20 minutes because of pain. No previous right knee injuries.  Pertinent ROS were reviewed with the patient and found to be negative unless otherwise specified above in HPI.   PERTINENT  PMH / PSH / FH / SH:  Past Medical, Surgical, Social, and Family History Reviewed & Updated in the EMR.  Pertinent findings include:    Past Medical History:  Diagnosis Date   Allergy    seasonal allergies   Anemia    hx of   Carotid atherosclerosis    Epilepsy (HCC)    Gastric ulcer    hx of   GERD (gastroesophageal reflux disease)    on meds   Growth hormone deficiency (HCC)    Hiatal hernia    Hypothyroidism    hx of Hashimoto thyroiditis   Pituitary adenoma (HCC)    hx of   Seizures (HCC)    temporal lobe   Thyroid disease    on meds   Vitamin D deficiency     Current Outpatient Medications on File Prior to Visit  Medication Sig Dispense Refill   desonide (DESOWEN) 0.05 % cream Apply 1 Application topically as needed. 60 g 3   LAMICTAL 100 MG tablet Take 1 tablet (100 mg total) by mouth 2 (two) times daily. 180 tablet 3   levothyroxine (SYNTHROID) 100 MCG tablet Take 100 mcg by mouth daily before breakfast.     omeprazole (PRILOSEC) 20 MG capsule Take 20 mg by mouth daily.     rosuvastatin (CRESTOR) 10 MG tablet Take 10 mg by mouth at bedtime.     No current facility-administered medications on file prior to visit.    Past Surgical History:  Procedure Laterality Date   COLONOSCOPY  12/10/2015   [place] Dr Lysle Morales   ESOPHAGOGASTRODUODENOSCOPY  06/03/2019   Dr Saunders [place],  [place]   WISDOM TOOTH EXTRACTION      No Known Allergies  OBJECTIVE:  BP 122/82   Ht 5\' 4"  (1.626 m)   Wt 168 lb (76.2 kg)   BMI 28.84 kg/m     MSK EXAM: Knee, right:  Inspection was negative for erythema, ecchymosis, and effusion. No obvious bony abnormalities or signs of osteophyte development. Palpation yielded no asymmetric warmth; No joint line tenderness; No condyle tenderness; Patellar tenderness and tender just lateral to patella; No knee crepitus. Patellar and quadriceps tendons unremarkable, and no tenderness of the pes anserine bursa. No obvious Baker's cyst development. ROM normal in flexion (135 degrees) and extension (0 degrees). Strength 5/5 with knee flexion and extension. Neurovascularly intact bilaterally.   Provocative Testing:    - Patella:   - Patellar grind/compression: NEG   - Patellar glide: Appropriate medial/lateral glide without apprehension - Cruciate Ligaments:   - Anterior Drawer/Lachman test: NEG - Posterior Drawer: NEG  - Collateral Ligaments:   - Varus/Valgus (MCL/LCL) Stress test at 0, 15d: NEG   - Apley's Compression/Distraction test: NEG  - Meniscus:   - McMurray's: NEG    Assessment & Plan Patellar tendinitis of right knee Recently started exercising again with spin bike  and water aerobics and developed aching pain of patellar tendon. On exam TTP of patellar tendon and just lateral/inferior to patella. Knee stability normal. Discussed that her pain is c/w patellar tendonitis and possible plica. We discussed treatment options including home exercises, NSAIDs, icing, and shockwave therapy. She would like to complete a few shockwave therapy sessions before she leaves for [place] next week. First session completed today. Will schedule for one more session before she goes and recommended searching for shockwave therapy in [place] to complete 4-6 sessions. She will follow up when she returns.  Procedure: ECSWT Indications:  patellar tendinitis    Procedure Details Consent: Risks of procedure as well as the alternatives and risks of each were explained to the patient.  Written consent for procedure obtained. Time Out: Verified patient identification, verified procedure, site was marked, verified correct patient position, medications/allergies/relevent history reviewed.  The area was cleaned with alcohol swab.     The right patellar tendon was targeted for Extracorporeal shockwave therapy.    Preset: patellar tendonitis Power Level: *** Frequency: *** Impulse/cycles: *** Head size: large   Patient tolerated procedure well without immediate complications   Micah Noel MD PGY2 [facility]

## 2023-12-17 NOTE — Patient Instructions (Signed)
You have patellofemoral syndrome and patellar tendinopathy. Avoid painful activities when possible (often deep squats, lunges, leg press bother this). Cross train with swimming, cycling with low resistance, elliptical if needed and pain is < 3 on a scale of 1-10. Do home exercises daily.  Add ankle weights if these become too easy. We did shockwave to the patellar tendon today - return on Friday if possible to have this repeated.  Look up providers up in [place] to see who can continue this for 4-6 visits. Consider formal physical therapy. Continue wearing your orthotics. Avoid flat shoes, barefoot walking as much as possible. Icing 15 minutes at a time 3-4 times a day as needed. Tylenol or ibuprofen as needed for pain. Follow up with someone up there in about 6 weeks.

## 2023-12-18 ENCOUNTER — Telehealth: Payer: Self-pay | Admitting: Pharmacist

## 2023-12-18 NOTE — Telephone Encounter (Signed)
Per the insurance an appeal for a tier exception non-formulary medication is not allowed.

## 2023-12-19 ENCOUNTER — Ambulatory Visit (INDEPENDENT_AMBULATORY_CARE_PROVIDER_SITE_OTHER): Payer: Medicare Other | Admitting: Family Medicine

## 2023-12-20 ENCOUNTER — Encounter: Payer: Self-pay | Admitting: Neurology

## 2023-12-20 ENCOUNTER — Ambulatory Visit (INDEPENDENT_AMBULATORY_CARE_PROVIDER_SITE_OTHER): Payer: Medicare Other | Admitting: Sports Medicine

## 2023-12-20 ENCOUNTER — Other Ambulatory Visit: Payer: Self-pay

## 2023-12-20 VITALS — Ht 64.0 in | Wt 168.0 lb

## 2023-12-20 DIAGNOSIS — M7651 Patellar tendinitis, right knee: Secondary | ICD-10-CM | POA: Diagnosis present

## 2023-12-20 DIAGNOSIS — M25562 Pain in left knee: Secondary | ICD-10-CM | POA: Diagnosis not present

## 2023-12-20 NOTE — Telephone Encounter (Signed)
This was all ompletd for a tier exception and when I called the insurance they state a actual coverage determination has not yet been completed. I have completed a coverage determination PA and submitted to [employer].

## 2023-12-20 NOTE — Assessment & Plan Note (Signed)
I showed her the ultrasound findings Think if we use ESWT in the future we may want to focus more over the area where the spur impinges on her VMO tendon Patellar tendon look normal  I suggested for the near future since she will be away and does not have easy access to this treatment that we try giving her a series of hip abduction strengthening exercises Getting her to resume her regular strength workouts but without any knee bend past 45 degrees  See how she does with physical strengthening Return to see [place] in 2 months if her symptoms or not resolving after she does the home exercise program

## 2023-12-20 NOTE — Progress Notes (Signed)
Chief complaint right anterior knee pain  Patient was seen last week for possible patellar tendinitis.  She did have a shockwave treatment but is unsure that she got that much benefit from it.  The knee is not bothering her that much except when she does a fair amount of activity.  She is not getting night pain.  She does not think she is getting swelling.  He has not had any mechanical symptoms of locking or giving way.  She will be traveling to [place] for 2 months so before tried more shockwave she was hoping for a diagnostic ultrasound and may be a reassessment to see what her best option would be.  Physical exam Pleasant female in no acute distress Ht 5\' 4"  (1.626 m)   Wt 168 lb (76.2 kg)   BMI 28.84 kg/m   Right knee Knee: Normal to inspection with no erythema or effusion or obvious bony abnormalities. Palpation normal with no warmth or joint line tenderness or patellar tenderness or condyle tenderness. ROM normal in flexion and extension and lower leg rotation. Ligaments with solid consistent endpoints including ACL, PCL, LCL, MCL. Negative Mcmurray's and provocative meniscal tests. Non painful patellar compression. Patellar and quadriceps tendons unremarkable. Hamstring and quadriceps strength is normal.  There is some clicking on flexion and extension of the knee that centers around the VMO tendon and along the lateral knee There was not pain on patellar compression or on palpation of her patellar tendon  Abduction strength testing reveals that she is significantly weak on the right but not the left side Her VMO testing on the right was not particularly weak but did trigger slight pain with contraction  Right knee ultrasound Patellar tendon is normal in appearance and has no abnormal Doppler activity Quadriceps tendon is normal in appearance Suprapatellar pouch without an effusion Under the VMO tendon however there is a small spur that does seem to catch with flexion  extension of the knee At the medial and lateral joint line she still has reasonable meniscal space with some very small spurs noted  Impression: No ultrasound evidence of patellar tendinitis but some mild arthritic spurring that may be causing some impingement of the VMO tendon  Ultrasound and interpretation by Royal Hawthorn B. Darrick Penna, MD

## 2023-12-21 ENCOUNTER — Ambulatory Visit: Payer: Self-pay | Admitting: Family Medicine

## 2023-12-25 ENCOUNTER — Ambulatory Visit (INDEPENDENT_AMBULATORY_CARE_PROVIDER_SITE_OTHER): Payer: Medicare Other | Admitting: Family Medicine

## 2024-01-08 ENCOUNTER — Ambulatory Visit: Payer: BC Managed Care – PPO | Admitting: Diagnostic Neuroimaging

## 2024-02-04 ENCOUNTER — Ambulatory Visit: Payer: Medicare Other | Admitting: Cardiology

## 2024-02-25 ENCOUNTER — Ambulatory Visit: Payer: BC Managed Care – PPO | Admitting: Family Medicine

## 2024-02-28 ENCOUNTER — Ambulatory Visit (INDEPENDENT_AMBULATORY_CARE_PROVIDER_SITE_OTHER): Payer: Medicare Other | Admitting: Family Medicine

## 2024-02-28 ENCOUNTER — Encounter (INDEPENDENT_AMBULATORY_CARE_PROVIDER_SITE_OTHER): Payer: Self-pay

## 2024-03-11 ENCOUNTER — Telehealth (INDEPENDENT_AMBULATORY_CARE_PROVIDER_SITE_OTHER): Payer: Self-pay | Admitting: Family Medicine

## 2024-03-11 NOTE — Telephone Encounter (Signed)
 This patient was last seen 11/04 (6 months and some change) and was attempting to make another appointment and was unhappy with our policy that she is required to pay the program fee again before being scheduled.   She requested to speak directly to Dr. B (I declined this request) in hopes for an advocate against having to pay. She is unhappy with the decision and has decided not to rejoin at this time.

## 2024-03-17 ENCOUNTER — Ambulatory Visit: Admitting: Family Medicine

## 2024-03-17 VITALS — BP 102/70 | Ht 64.0 in | Wt 167.0 lb

## 2024-03-17 DIAGNOSIS — M79671 Pain in right foot: Secondary | ICD-10-CM

## 2024-03-17 DIAGNOSIS — M1711 Unilateral primary osteoarthritis, right knee: Secondary | ICD-10-CM | POA: Diagnosis present

## 2024-03-17 NOTE — Patient Instructions (Signed)
 Your knee pain is due to arthritis (finger pain is as well) Start physical therapy at [facility] with aqua therapy.  Add the extensor tendon strain of your right foot to this also. These are the different medications you can take for this: Tylenol 500mg  1-2 tabs three times a day for pain. Voltaren gel, capsaicin, aspercreme, or biofreeze topically up to four times a day may also help with pain. Some supplements that may help for arthritis: Boswellia extract, curcumin, pycnogenol Aleve 1-2 tabs twice a day with food Cortisone injections are an option. If cortisone injections do not help, there are different types of shots that may help but they take longer to take effect. It's important that you continue to stay active. Shoe inserts with good arch support may be helpful. Heat or ice 15 minutes at a time 3-4 times a day as needed to help with pain. Water aerobics and cycling with low resistance are the best two types of exercise for arthritis though any exercise is ok as long as it doesn't worsen the pain. Follow up with me before you leave for [place] .

## 2024-03-17 NOTE — Progress Notes (Addendum)
 PCP: Charise Companion, MD  Subjective:   HPI: Patient is a 68 y.o. female here for f/u of RT knee.  RT knee pain: Kim Kim reports her RT knee pain is 75% improved. She was having pain while up in [place]  recently and was seen by an orthopedist there who did X-rays 01/31/24 showing mild to moderate tricompartmental right knee osteoarthrosis with small joint effusion and discussed options for PT and surgical management. She is not interested in surgery at this time. Her pain is still mostly following long walks. She is not taking any anti-inflammatories. She has not been wearing a compression sleeve. She is interested in aqua therapy and has had success with this in the past.  RT foot pain: -Kim Kim describes pain on the top of her foot/ankle that is improving.  -She had been doing a lot of hiking and walking on her travels. -She has not been doing anything for treatment   Past Medical History:  Diagnosis Date   Allergy    seasonal allergies   Anemia    hx of   Carotid atherosclerosis    Epilepsy (HCC)    Gastric ulcer    hx of   GERD (gastroesophageal reflux disease)    on meds   Growth hormone deficiency (HCC)    Hiatal hernia    Hypothyroidism    hx of Hashimoto thyroiditis   Pituitary adenoma (HCC)    hx of   Seizures (HCC)    temporal lobe   Thyroid  disease    on meds   Vitamin D deficiency     Current Outpatient Medications on File Prior to Visit  Medication Sig Dispense Refill   desonide  (DESOWEN ) 0.05 % cream Apply 1 Application topically as needed. 60 g 3   LAMICTAL  100 MG tablet Take 1 tablet (100 mg total) by mouth 2 (two) times daily. 180 tablet 3   levothyroxine (SYNTHROID ) 100 MCG tablet Take 100 mcg by mouth daily before breakfast.     omeprazole  (PRILOSEC ) 20 MG capsule Take 20 mg by mouth daily.     rosuvastatin (CRESTOR) 10 MG tablet Take 10 mg by mouth at bedtime.     No current facility-administered medications on file prior to visit.    Past Surgical  History:  Procedure Laterality Date   COLONOSCOPY  12/10/2015   [place]  Dr Altamese Ates   ESOPHAGOGASTRODUODENOSCOPY  06/03/2019   Dr Doneta [place], [place]   WISDOM TOOTH EXTRACTION      No Known Allergies  BP 102/70   Ht 5\' 4"  (1.626 m)   Wt 167 lb (75.8 kg)   BMI 28.67 kg/m      09/14/2021    2:12 PM  [facility] Adult [facility]  Frequency of aerobic exercise (# of days/week) 4  Average time in minutes 60  Frequency of strengthening activities (# of days/week) 4        No data to display              Objective:  Physical Exam:  Gen: NAD, comfortable in exam room  RT Knee Exam: Inspection: No effusion, erythema, or bruising. Palpation: No warmth. TTP along medial and lateral joint line. No patellar TTP. ROM: Full ROM with knee extension, limited flexion Strength: 5/5 strength with knee extension and flexion Neuro/vasc: Sensation intact distally bilaterally. Special Tests: Negative McMurray. Negative Apley. Negative anterior drawer. No joint laxity with varus and valgus.  RT ankle exam: No obvious deformity or ecchymoses. TTP of extensor digitorum. No TTP  of ATFL or deltoid ligament. Full ROM. 5/5 strength with plantar flexion and dorsiflexion.    Assessment & Plan:  Kim Kim is a 68yoF with hx of mild to moderate RT knee OA presenting for f/u of RT knee pain and new onset RT foot pain.  1. RT knee pain: Patient reports 75% improvement in her RT knee pain. She is still having pain after long walks and hikes. She is interested in aqua therapy, which has helped in the past. Exam today shows no effusion and TTP along medial and lateral joint line. Suspect continued pain is secondary to underlying RT knee OA. Recommend continued PT for improvement in symptoms. -Referral sent for Aqua Therapy at [facility]. -Provided recommendation for additional therapies as needed: Tylenol 500mg  1-2 tabs three times a day for pain. Voltaren gel, capsaicin, aspercreme, or  biofreeze topically up to four times a day for pain. Some supplements that may help for arthritis: Boswellia extract, curcumin, pycnogenol. Aleve 1-2 tabs twice a day with food. Heat or ice 15 minutes at a time 3-4 times a day as needed to help with pain. -Steroid or gel injections may provide benefit in the future but given improvement with PT, recommend continuing the course. -Recommend continuing to stay active -F/u in 4 weeks before returning to [place]   2. RT foot pain: Patient reports anterior foot/ankle pain that began after increased walking/hiking on her recent trip and has been improving without treatment. TTP of extensor digitorum on exam today. Suspect foot pain is secondary to extensor digitorum tendinitis.  -Added extensor tendon strain of RT foot to PT referral at [facility].  Unknown Garbe, MS4 [facility] [employer]

## 2024-03-19 ENCOUNTER — Ambulatory Visit (HOSPITAL_BASED_OUTPATIENT_CLINIC_OR_DEPARTMENT_OTHER): Admitting: Physical Therapy

## 2024-03-27 ENCOUNTER — Other Ambulatory Visit: Payer: Self-pay

## 2024-03-27 ENCOUNTER — Encounter (HOSPITAL_BASED_OUTPATIENT_CLINIC_OR_DEPARTMENT_OTHER): Payer: Self-pay | Admitting: Physical Therapy

## 2024-03-27 ENCOUNTER — Ambulatory Visit (HOSPITAL_BASED_OUTPATIENT_CLINIC_OR_DEPARTMENT_OTHER): Attending: Family Medicine | Admitting: Physical Therapy

## 2024-03-27 DIAGNOSIS — M1711 Unilateral primary osteoarthritis, right knee: Secondary | ICD-10-CM | POA: Insufficient documentation

## 2024-03-27 DIAGNOSIS — G8929 Other chronic pain: Secondary | ICD-10-CM | POA: Diagnosis present

## 2024-03-27 DIAGNOSIS — M25561 Pain in right knee: Secondary | ICD-10-CM | POA: Diagnosis present

## 2024-03-27 DIAGNOSIS — M79671 Pain in right foot: Secondary | ICD-10-CM | POA: Insufficient documentation

## 2024-03-27 NOTE — Therapy (Signed)
 OUTPATIENT PHYSICAL THERAPY LOWER EXTREMITY EVALUATION   Patient Name: Kim Kim MRN: [id number] DOB:04-13-1956, 68 y.o., female Today's Date: 03/27/2024  END OF SESSION:  PT End of Session - 03/27/24 1728     Visit Number 1    Number of Visits 4    Date for PT Re-Evaluation 04/25/24    Authorization Type medicare    PT Start Time 0848    PT Stop Time 0920    PT Time Calculation (min) 32 min    Activity Tolerance Patient tolerated treatment well    Behavior During Therapy WFL for tasks assessed/performed             Past Medical History:  Diagnosis Date   Allergy    seasonal allergies   Anemia    hx of   Carotid atherosclerosis    Epilepsy (HCC)    Gastric ulcer    hx of   GERD (gastroesophageal reflux disease)    on meds   Growth hormone deficiency (HCC)    Hiatal hernia    Hypothyroidism    hx of Hashimoto thyroiditis   Pituitary adenoma (HCC)    hx of   Seizures (HCC)    temporal lobe   Thyroid  disease    on meds   Vitamin D deficiency    Past Surgical History:  Procedure Laterality Date   COLONOSCOPY  12/10/2015   [place]  Dr Altamese Ates   ESOPHAGOGASTRODUODENOSCOPY  06/03/2019   Dr Doneta [place], [place]   WISDOM TOOTH EXTRACTION     Patient Active Problem List   Diagnosis Date Noted   Left anterior knee pain 12/20/2023   Insufficient treatment with nasal CPAP 08/31/2023   OSA on CPAP 04/04/2023   Obesity, Beginning BMI 30.0 04/04/2023   Hashimoto's thyroiditis 03/21/2023   Low bone density 03/21/2023   Pre-diabetes 03/21/2023   Healthcare maintenance 03/14/2023   History of cleft palate 02/12/2023   Sleep apnea treated with nocturnal BiPAP 02/12/2023   BMI 29.0-29.9,adult 01/03/2023   Bacterial overgrowth syndrome 07/19/2021   Prediabetes 01/22/2020   Chronic idiopathic constipation 09/01/2019   Family history of heart disease 12/11/2018   Environmental and seasonal allergies 10/17/2018   B12 deficiency 10/16/2017   Osteoporosis  10/01/2017   Uterine fibroid 07/31/2017   Velopharyngeal insufficiency (VPI), 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   Iron  deficiency 04/27/2016   Deviated nasal septum 12/01/2015   Hypothyroidism 12/17/2012   Insulin  resistance 12/17/2012   Auras 10/15/2012   Pituitary microadenoma (HCC) 01/05/2012   Pulmonary nodules 07/09/2011   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  She is known to have a bone spur and OA.  She had been traveling out of the country doing a lot of walking. Has not been on a regular exercise program which she has been following over the past year but has kept up walking daily.  Knee pain does not impede any of her mobility.  PERTINENT HISTORY: Hx of right knee oa PAIN:  Are you having pain? Yes: NPRS scale:  current 0/10; worst 5/10  Pain location: R knee Pain description: ache Aggravating factors: working out Relieving factors: rest/ walking   PRECAUTIONS: None   WEIGHT BEARING RESTRICTIONS No   FALLS:  Has patient fallen in last 6 months? No Number of falls: 0   LIVING ENVIRONMENT: Lives with: lives with their family and lives with their spouse Lives in: House/apartment Stairs: No;  Has following equipment at home: none   OCCUPATION: Retired [employer].  Hobbies include exercise, water aerobics, walking, reading, cooking, needle point    PLOF: Independent  PATIENT GOALS: return to regular exercise program  NEXT MD VISIT: as needed  OBJECTIVE:  Note: Objective  measures were completed at Evaluation unless otherwise noted.  DIAGNOSTIC FINDINGS: R Knee US  5/25 Impression: No ultrasound evidence of patellar tendinitis but some mild arthritic spurring that may be causing some impingement of the VMO tendon   PATIENT SURVEYS:  LEFS 80/80   COGNITION: Overall cognitive status: Within functional limits for tasks assessed     SENSATION: WFL  PALPATION: Mild TTP about medial knee joint line, pes anserine   LOWER EXTREMITY ROM:  Active ROM Right eval Left eval  Hip flexion    Hip extension    Hip abduction    Hip adduction    Hip internal rotation    Hip external rotation    Knee flexion 130   Knee extension 0   Ankle dorsiflexion    Ankle plantarflexion    Ankle inversion    Ankle eversion     (Blank rows = not tested)  LOWER EXTREMITY MMT:  MMT Right eval Left eval  Hip flexion 59.2 53.5  Hip extension    Hip abduction 34 31.2  Hip adduction    Hip internal rotation    Hip external rotation    Knee flexion    Knee extension 5/5 5/5  Ankle dorsiflexion    Ankle plantarflexion    Ankle inversion    Ankle eversion     (Blank rows = not tested)   FUNCTIONAL TESTS:  5 times sit to stand: 9.19 4 stage balance:passed   GAIT: Distance walked: 500 ft no pain nor limitations with amb                                                                                                                                 TREATMENT  Eval Self care:Posture and body mechanics;benefits of returning to regular exercise program. [employer]. Walking for exercise   PATIENT EDUCATION:  Education details: Discussed eval findings, rehab rationale, aquatic program progression/POC and pools in area. Patient is in agreement  Person educated: Patient Education method: Explanation Education comprehension: verbalized understanding  HOME EXERCISE PROGRAM: Has laminated copy from last aquatic cert.  Will bring with her next  session  ASSESSMENT:  CLINICAL IMPRESSION: Patient is a 68 y.o. f who was seen today for physical therapy evaluation and treatment for right knee  pain.  Pt is well known to this clinic as she was seen for similar dx 2.5 years ago.  She reports she begun having some right knee pain  (due to mild OA) about 6 months ago that has waxed and waned.  No pain or limitation with functional mobility or ADL's today.  She is wanting a refresher on her exercise program she was instructed on at last certification as she had followed it consistently for a long time then had stopped due to traveling over the past year with a return of right knee discomfort.  She will benefit from a short certification for re-instruction and update on her aquatic exercise program to improve strength and increase mobility of right knee which will decrease pain sensitivity  OBJECTIVE IMPAIRMENTS: Abnormal gait and difficulty walking.   ACTIVITY LIMITATIONS: squatting and stairs  REHAB POTENTIAL: Excellent  CLINICAL DECISION MAKING: Stable/uncomplicated  EVALUATION COMPLEXITY: Low   GOALS: Goals reviewed with patient? Yes  SHORT TERM GOALS: Target date: 04/25/24 Pt will tolerate full aquatic sessions consistently without increase in pain and with improving function to demonstrate good toleration and effectiveness of intervention.  Baseline: Goal status: INITIAL  2.  Pt will be indep with final aquatic HEP for continued management of condition Baseline:  Goal status: INITIAL  3.  Pt will report 0/10 pain Baseline:  Goal status: INITIAL  4.  Pt will resume regular exercise program for management of right knee pain and overall general health. Baseline:  Goal status: INITIAL    LONG TERM GOALS: To be set at re-cert if approp    PLAN:  PT FREQUENCY: 1-2x/week  PT DURATION: 4 sessions  PLANNED INTERVENTIONS: 97164- PT Re-evaluation, 97110-Therapeutic exercises, 97530- Therapeutic activity, 97112- Neuromuscular  re-education, 97535- Self Care, [id number]- Manual therapy, [phone]- Gait training, [phone]- Aquatic Therapy, Patient/Family education, Balance training, Stair training, Taping, Dry Needling, Joint mobilization, DME instructions, Cryotherapy, and Moist heat  PLAN FOR NEXT SESSION: Instruction on HEP for le and core strengthening and ROM   Adriana Hopping Laneta Pintos) Kobee Medlen MPT 03/27/24 5:30 PM [facility] [address] [place], [place], [address] Phone: [phone]   Fax:  [phone]

## 2024-04-04 ENCOUNTER — Encounter (HOSPITAL_BASED_OUTPATIENT_CLINIC_OR_DEPARTMENT_OTHER): Payer: Self-pay | Admitting: Physical Therapy

## 2024-04-04 ENCOUNTER — Ambulatory Visit (HOSPITAL_BASED_OUTPATIENT_CLINIC_OR_DEPARTMENT_OTHER): Attending: Family Medicine | Admitting: Physical Therapy

## 2024-04-04 DIAGNOSIS — M25561 Pain in right knee: Secondary | ICD-10-CM | POA: Diagnosis present

## 2024-04-04 DIAGNOSIS — G8929 Other chronic pain: Secondary | ICD-10-CM | POA: Insufficient documentation

## 2024-04-04 NOTE — Therapy (Signed)
 OUTPATIENT PHYSICAL THERAPY LOWER EXTREMITY EVALUATION   Patient Name: Kim Kim MRN: [id number] DOB:27-Dec-1955, 68 y.o., female Today's Date: 04/04/2024  END OF SESSION:  PT End of Session - 04/04/24 0758     Visit Number 2    Number of Visits 4    Date for PT Re-Evaluation 04/25/24    Authorization Type medicare    PT Start Time 0750    PT Stop Time 0830    PT Time Calculation (min) 40 min    Activity Tolerance Patient tolerated treatment well    Behavior During Therapy WFL for tasks assessed/performed             Past Medical History:  Diagnosis Date   Allergy    seasonal allergies   Anemia    hx of   Carotid atherosclerosis    Epilepsy (HCC)    Gastric ulcer    hx of   GERD (gastroesophageal reflux disease)    on meds   Growth hormone deficiency (HCC)    Hiatal hernia    Hypothyroidism    hx of Hashimoto thyroiditis   Pituitary adenoma (HCC)    hx of   Seizures (HCC)    temporal lobe   Thyroid  disease    on meds   Vitamin D deficiency    Past Surgical History:  Procedure Laterality Date   COLONOSCOPY  12/10/2015   [place]  Dr Altamese Ates   ESOPHAGOGASTRODUODENOSCOPY  06/03/2019   Dr Doneta Furbish, [place]   WISDOM TOOTH EXTRACTION     Patient Active Problem List   Diagnosis Date Noted   Left anterior knee pain 12/20/2023   Insufficient treatment with nasal CPAP 08/31/2023   OSA on CPAP 04/04/2023   Obesity, Beginning BMI 30.0 04/04/2023   Hashimoto's thyroiditis 03/21/2023   Low bone density 03/21/2023   Pre-diabetes 03/21/2023   Healthcare maintenance 03/14/2023   History of cleft palate 02/12/2023   Sleep apnea treated with nocturnal BiPAP 02/12/2023   BMI 29.0-29.9,adult 01/03/2023   Bacterial overgrowth syndrome 07/19/2021   Prediabetes 01/22/2020   Chronic idiopathic constipation 09/01/2019   Family history of heart disease 12/11/2018   Environmental and seasonal allergies 10/17/2018   B12 deficiency 10/16/2017   Osteoporosis  10/01/2017   Uterine fibroid 07/31/2017   Velopharyngeal insufficiency (VPI), 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   Iron  deficiency 04/27/2016   Deviated nasal septum 12/01/2015   Hypothyroidism 12/17/2012   Insulin  resistance 12/17/2012   Auras 10/15/2012   Pituitary microadenoma (HCC) 01/05/2012   Pulmonary nodules 07/09/2011   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  She is known to have a bone spur and OA.  She had been traveling out of the country doing a lot of walking. Has not been on a regular exercise program which she has been following over the past year but has kept up walking daily.  Knee pain does not impede any of her mobility.  PERTINENT HISTORY: Hx of right knee oa  PAIN:  Are you having pain? Yes: NPRS scale: current 0/10; worst 5/10  Pain location: R knee Pain description: ache Aggravating factors: working out Relieving factors: rest/ walking   PRECAUTIONS: None   WEIGHT BEARING RESTRICTIONS No   FALLS:  Has patient fallen in last 6 months? No Number of falls: 0   LIVING ENVIRONMENT: Lives with: lives with their family and lives with their spouse Lives in: House/apartment Stairs: No;  Has following equipment at home: none   OCCUPATION: Retired [employer].  Hobbies include exercise, water aerobics, walking, reading, cooking, needle point    PLOF: Independent  PATIENT GOALS: return to regular exercise program  NEXT MD  VISIT: as needed  OBJECTIVE:  Note: Objective measures were completed at Evaluation unless otherwise noted.  DIAGNOSTIC FINDINGS: R Knee US  5/25 Impression: No ultrasound evidence of patellar tendinitis but some mild arthritic spurring that may be causing some impingement of the VMO tendon   PATIENT SURVEYS:  LEFS 80/80   COGNITION: Overall cognitive status: Within functional limits for tasks assessed     SENSATION: WFL  PALPATION: Mild TTP about medial knee joint line, pes anserine   LOWER EXTREMITY ROM:  Active ROM Right eval Left eval  Hip flexion    Hip extension    Hip abduction    Hip adduction    Hip internal rotation    Hip external rotation    Knee flexion 130   Knee extension 0   Ankle dorsiflexion    Ankle plantarflexion    Ankle inversion    Ankle eversion     (Blank rows = not tested)  LOWER EXTREMITY MMT:  MMT Right eval Left eval  Hip flexion 59.2 53.5  Hip extension    Hip abduction 34 31.2  Hip adduction    Hip internal rotation    Hip external rotation    Knee flexion    Knee extension 5/5 5/5  Ankle dorsiflexion    Ankle plantarflexion    Ankle inversion    Ankle eversion     (Blank rows = not tested)   FUNCTIONAL TESTS:  5 times sit to stand: 9.19 4 stage balance:passed   GAIT: Distance walked: 500 ft no pain nor limitations with amb                                                                                                                                 TREATMENT  [facility] Adult PT Treatment:                                                DATE: 04/04/24 Pt seen for aquatic therapy today.  Treatment took place in water 3.5-4.75 ft in depth at the [facility] pool. Temp of water was 91.  Pt entered/exited the pool via stairs  with hand rail.   Exercises - Side to Side Hamstring Stretch with Noodle at [employer]    - Walking Forward Lunge   - Side lunge with hand buoys  - Noodle press   - Plank on [employer] with Arm Lifts  - Standing Hip Hinge  - Runner's Step Up/Down    Pt requires the buoyancy and hydrostatic pressure of water for support, and to offload joints by unweighting joint load by at least 50 % in navel deep water and by at least 75-80% in chest to neck deep water.  Viscosity of the water is needed for resistance of strengthening. Water current perturbations provides challenge to standing balance requiring increased core activation.      PATIENT EDUCATION:  Education details: Discussed eval findings, rehab rationale, aquatic program progression/POC and pools in area. Patient is in agreement  Person educated: Patient Education method: Explanation Education comprehension: verbalized understanding  HOME EXERCISE PROGRAM: Access Code: [email or web address] URL: [email or web address] Date: 04/04/2024 Prepared by: Aretta Kudo  Exercises - Side to Side Hamstring Stretch with Noodle at [facility]  - 1 x daily - 1-3 x weekly - Walking Forward Lunge  - 1 x daily - 1-3 x weekly - 1-2 sets - 10 reps - Side lunge with hand buoys  - 1 x daily - 1-3 x weekly - 1-2 sets - 10 reps - Noodle press  - 1 x daily - 1-3 x weekly - 1-3 sets - 10 reps - Plank on Long Hand Float with Arm Lifts  - 1 x daily - 1-3 x weekly - 2 sets - 5 reps - Standing Hip Hinge  - 1 x daily - 1-3 x weekly - 3 sets - 10 reps - Runner's Step Up/Down  - 1 x daily - 1-3 x weekly - 3 sets - 10 reps Not issued nor printed/incomplete  ASSESSMENT:  CLINICAL IMPRESSION: Pt demonstrates safety and independence in aquatic setting with therapist instructing from deck. She is confident in setting, moving throughout all depths easily. She is well known to this clinic with goals to re-instruct on HEP for knee and LB strength.  Pt is directed through various movement patterns in standing.  She requires verbal as well as TC and demonstration for execution. Good toleration and understanding.HEP creation initiated. Goals are  ongoing.     Initial Impression Patient is a 68 y.o. f who was seen today for physical therapy evaluation and treatment for right knee pain.  Pt is well known to this clinic as she was seen for similar dx 2.5 years ago.  She reports she begun having some right knee pain  (due to mild OA) about 6 months ago that has waxed and waned.  No pain or limitation with functional mobility or ADL's today.  She is wanting a refresher on her exercise program she was instructed on at last certification as she had followed it consistently for a long time then had stopped due to traveling over the past year with a return of right knee discomfort.  She will benefit from a short certification for re-instruction and update on her aquatic exercise program to improve strength and increase mobility of right knee which will decrease pain sensitivity  OBJECTIVE IMPAIRMENTS: Abnormal gait and difficulty walking.   ACTIVITY LIMITATIONS: squatting and stairs  REHAB POTENTIAL: Excellent  CLINICAL DECISION MAKING: Stable/uncomplicated  EVALUATION COMPLEXITY: Low   GOALS: Goals reviewed with patient? Yes  SHORT TERM GOALS: Target date: 04/25/24 Pt will tolerate full  aquatic sessions consistently without increase in pain and with improving function to demonstrate good toleration and effectiveness of intervention.  Baseline: Goal status: INITIAL  2.  Pt will be indep with final aquatic HEP for continued management of condition Baseline:  Goal status: INITIAL  3.  Pt will report 0/10 pain Baseline:  Goal status: INITIAL  4.  Pt will resume regular exercise program for management of right knee pain and overall general health. Baseline:  Goal status: INITIAL    LONG TERM GOALS: To be set at re-cert if approp    PLAN:  PT FREQUENCY: 1-2x/week  PT DURATION: 4 sessions  PLANNED INTERVENTIONS: 97164- PT Re-evaluation, 97110-Therapeutic exercises, 97530- Therapeutic activity, 97112- Neuromuscular  re-education, 97535- Self Care, 24401- Manual therapy, [phone]- Gait training, [phone]- Aquatic Therapy, Patient/Family education, Balance training, Stair training, Taping, Dry Needling, Joint mobilization, DME instructions, Cryotherapy, and Moist heat  PLAN FOR NEXT SESSION: Instruction on HEP for le and core strengthening and ROM   Adriana Hopping Laneta Pintos) Anniah Glick MPT 04/04/24 10:45 AM [facility] [address] [place], [place], [address] Phone: [phone]   Fax:  [phone]

## 2024-04-08 ENCOUNTER — Telehealth: Payer: Self-pay | Admitting: Neurology

## 2024-04-08 DIAGNOSIS — G4733 Obstructive sleep apnea (adult) (pediatric): Secondary | ICD-10-CM

## 2024-04-08 DIAGNOSIS — G4730 Sleep apnea, unspecified: Secondary | ICD-10-CM

## 2024-04-08 NOTE — Telephone Encounter (Signed)
 Dr Dohmeier reviewed the report and would like to see if they order change can be reflected to what she was previously on here as this was effectively treating her apnea.

## 2024-04-08 NOTE — Telephone Encounter (Signed)
 Called the patient back. Due to insurance issues and medicare guidelines she is no longer established with [facility] and had to turn the machine in. She had to go to [place] and establish with neurologist there in which Dr Albertina Hugger is aware. The patient has a DME [facility] [employer] where she gets supplies.  She is wanting Dr Albertina Hugger to take a look at her download and determine if pressure changes are needed, she is currently back in [place].  Advised that with her being set up there with new neuro they likely did not tag our office to the machine and so therefore, I do not have access to her download. She can contact [facility] to as them to tag our office otherwise can bring the machine in for me to do a DL from the machine.  Pt verbalized understanding.

## 2024-04-08 NOTE — Telephone Encounter (Signed)
 Called the pt back. Advised I do have access to her recent DL and her current BiPAP is set at 17/13 cm water pressure. The previous machine she was set up with was auto BiPAP which read as V auto- max IPAP-25, min epap-17 cm water pressure with 4 cm pressure support.  Pt is stating the current cpap doesn't feel as effective as the older setting was. She is wandering what adjustments are needed to help.

## 2024-04-08 NOTE — Addendum Note (Signed)
 Addended by: Elton Ham on: 04/08/2024 04:27 PM   Modules accepted: Orders

## 2024-04-08 NOTE — Telephone Encounter (Signed)
 Pt called  DME [facility] gave  Dr. Albertina Hugger  permissiopn to go in to check  Bipap  Machine activity  Pt would like call back .

## 2024-04-08 NOTE — Telephone Encounter (Signed)
 Pt called wanting to speak to the RN about her Bipap and supplies. Pt states that she has new insurance that had her change where she is getting her supplies from. She would like to discuss with RN.

## 2024-04-09 ENCOUNTER — Encounter (HOSPITAL_BASED_OUTPATIENT_CLINIC_OR_DEPARTMENT_OTHER): Payer: Self-pay | Admitting: Physical Therapy

## 2024-04-09 ENCOUNTER — Ambulatory Visit (HOSPITAL_BASED_OUTPATIENT_CLINIC_OR_DEPARTMENT_OTHER): Admitting: Physical Therapy

## 2024-04-09 DIAGNOSIS — M25561 Pain in right knee: Secondary | ICD-10-CM | POA: Diagnosis not present

## 2024-04-09 DIAGNOSIS — G8929 Other chronic pain: Secondary | ICD-10-CM

## 2024-04-09 NOTE — Therapy (Signed)
 OUTPATIENT PHYSICAL THERAPY LOWER EXTREMITY EVALUATION   Patient Name: Kim Kim MRN: [id number] DOB:July 15, 1956, 68 y.o., female Today's Date: 04/09/2024  END OF SESSION:  PT End of Session - 04/09/24 0729     Visit Number 3    Number of Visits 4    Date for PT Re-Evaluation 04/25/24    Authorization Type medicare    PT Start Time 0715    PT Stop Time 0755    PT Time Calculation (min) 40 min    Activity Tolerance Patient tolerated treatment well    Behavior During Therapy WFL for tasks assessed/performed              Past Medical History:  Diagnosis Date   Allergy    seasonal allergies   Anemia    hx of   Carotid atherosclerosis    Epilepsy (HCC)    Gastric ulcer    hx of   GERD (gastroesophageal reflux disease)    on meds   Growth hormone deficiency (HCC)    Hiatal hernia    Hypothyroidism    hx of Hashimoto thyroiditis   Pituitary adenoma (HCC)    hx of   Seizures (HCC)    temporal lobe   Thyroid  disease    on meds   Vitamin D deficiency    Past Surgical History:  Procedure Laterality Date   COLONOSCOPY  12/10/2015   [place]  Dr Altamese Ates   ESOPHAGOGASTRODUODENOSCOPY  06/03/2019   Dr Doneta Furbish, [place]   WISDOM TOOTH EXTRACTION     Patient Active Problem List   Diagnosis Date Noted   Left anterior knee pain 12/20/2023   Insufficient treatment with nasal CPAP 08/31/2023   OSA on CPAP 04/04/2023   Obesity, Beginning BMI 30.0 04/04/2023   Hashimoto's thyroiditis 03/21/2023   Low bone density 03/21/2023   Pre-diabetes 03/21/2023   Healthcare maintenance 03/14/2023   History of cleft palate 02/12/2023   Sleep apnea treated with nocturnal BiPAP 02/12/2023   BMI 29.0-29.9,adult 01/03/2023   Bacterial overgrowth syndrome 07/19/2021   Prediabetes 01/22/2020   Chronic idiopathic constipation 09/01/2019   Family history of heart disease 12/11/2018   Environmental and seasonal allergies 10/17/2018   B12 deficiency 10/16/2017   Osteoporosis  10/01/2017   Uterine fibroid 07/31/2017   Velopharyngeal insufficiency (VPI), 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   Iron  deficiency 04/27/2016   Deviated nasal septum 12/01/2015   Hypothyroidism 12/17/2012   Insulin  resistance 12/17/2012   Auras 10/15/2012   Pituitary microadenoma (HCC) 01/05/2012   Pulmonary nodules 07/09/2011   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  She is known to have a bone spur and OA.  She had been traveling out of the country doing a lot of walking. Has not been on a regular exercise program which she has been following over the past year but has kept up walking daily.  Knee pain does not impede any of her mobility.  PERTINENT  HISTORY: Hx of right knee oa PAIN:  Are you having pain? Yes: NPRS scale: current 0/10; worst 5/10  Pain location: R knee Pain description: ache Aggravating factors: working out Relieving factors: rest/ walking   PRECAUTIONS: None   WEIGHT BEARING RESTRICTIONS No   FALLS:  Has patient fallen in last 6 months? No Number of falls: 0   LIVING ENVIRONMENT: Lives with: lives with their family and lives with their spouse Lives in: House/apartment Stairs: No;  Has following equipment at home: none   OCCUPATION: Retired [employer].  Hobbies include exercise, water aerobics, walking, reading, cooking, needle point    PLOF: Independent  PATIENT GOALS: return to regular  exercise program  NEXT MD VISIT: as needed  OBJECTIVE:  Note: Objective measures were completed at Evaluation unless otherwise noted.  DIAGNOSTIC FINDINGS: R Knee US  5/25 Impression: No ultrasound evidence of patellar tendinitis but some mild arthritic spurring that may be causing some impingement of the VMO tendon   PATIENT SURVEYS:  LEFS 80/80   COGNITION: Overall cognitive status: Within functional limits for tasks assessed     SENSATION: WFL  PALPATION: Mild TTP about medial knee joint line, pes anserine   LOWER EXTREMITY ROM:  Active ROM Right eval Left eval  Hip flexion    Hip extension    Hip abduction    Hip adduction    Hip internal rotation    Hip external rotation    Knee flexion 130   Knee extension 0   Ankle dorsiflexion    Ankle plantarflexion    Ankle inversion    Ankle eversion     (Blank rows = not tested)  LOWER EXTREMITY MMT:  MMT Right eval Left eval  Hip flexion 59.2 53.5  Hip extension    Hip abduction 34 31.2  Hip adduction    Hip internal rotation    Hip external rotation    Knee flexion    Knee extension 5/5 5/5  Ankle dorsiflexion    Ankle plantarflexion    Ankle inversion    Ankle eversion     (Blank rows = not tested)   FUNCTIONAL TESTS:  5 times sit to stand: 9.19 4 stage balance:passed   GAIT: Distance walked: 500 ft no pain nor limitations with amb                                                                                                                                 TREATMENT  [facility] Adult PT Treatment:                                                DATE: 04/04/24 Pt seen for aquatic therapy today.  Treatment took place in water 3.5-4.75 ft in depth at the [facility] pool. Temp of water was 91.  Pt entered/exited the pool via stairs with hand rail.   Exercises - Side to Side Hamstring Stretch with Noodle at [employer]   - Walking Forward Lunge   - Side lunge with hand buoys  - Noodle press    - Plank on [employer] with Arm Lifts  - Standing Hip Hinge  - Runner's Step Up/Down   - Noodle Stomp   - Standing Balance on Noodle at [employer]  - Squat on Noodle - Side to Side Pendulum Swing with Foam Dumbbells and Ankle Floats   - Forward Backward Pendulum Swings with Hip Abduction and Adduction   - Abdominal curls:  Chin on chest/Knees to chest   - Abdominal curls: Chin on chest/Knees to opposite chest  -cycling on noodle  Pt requires the buoyancy and hydrostatic pressure of water for support, and to offload joints by unweighting joint load by at least 50 % in navel deep water and by at least 75-80% in chest to neck deep water.  Viscosity of the water is needed for resistance of strengthening. Water current perturbations provides challenge to standing balance requiring increased core activation.      PATIENT EDUCATION:  Education details: Discussed eval findings, rehab rationale, aquatic program progression/POC and pools in area. Patient is in agreement  Person educated: Patient Education method: Explanation Education comprehension: verbalized understanding  HOME EXERCISE PROGRAM: Access Code: [email or web address] URL: [email or web address] Date: 04/04/2024 Prepared by: Aretta Kudo  Exercises - Side to Side Hamstring Stretch with Noodle at [facility]  - 1 x daily - 1-3 x weekly - Walking Forward Lunge  - 1 x daily - 1-3 x weekly - 1-2 sets - 10 reps - Side lunge with hand buoys  - 1 x daily - 1-3 x weekly - 1-2 sets - 10 reps - Noodle press  - 1 x daily - 1-3 x weekly - 1-3 sets - 10 reps - Plank on Long Hand Float with Arm Lifts  - 1 x daily - 1-3 x weekly - 2 sets - 5 reps - Standing Hip Hinge  - 1 x daily - 1-3 x weekly - 3 sets - 10 reps - Runner's Step Up/Down  - 1 x daily - 1-3 x weekly - 3 sets - 10 reps Page 1 laminated and issued. Page 2 to be completed next visit  ASSESSMENT:  CLINICAL IMPRESSION: Pt instructed through LE and core engagement adding  to HEP.  She requires vc and some demonstration for execution.  She is familiar with the pendulum exercises from last episode.  HEP is extensive as wanting to give pt a program that is all encompassing core and le.  She completes all with very good tolerance. And no reports of pain.  1st page issued.  Will issue last page next session.  Plan to instruct and add written clarifications as needed next session.  She is on track for dc at end of established cert.    Initial Impression Patient is a 68 y.o. f who was seen today for physical therapy evaluation and treatment for right knee pain.  Pt is well known to this clinic as she was seen for similar dx 2.5 years ago.  She reports she begun having some right knee pain  (due to mild OA) about 6 months ago that has waxed and waned.  No pain or limitation with functional mobility or ADL's today.  She is wanting a refresher on her exercise program she was instructed on at last certification as  she had followed it consistently for a long time then had stopped due to traveling over the past year with a return of right knee discomfort.  She will benefit from a short certification for re-instruction and update on her aquatic exercise program to improve strength and increase mobility of right knee which will decrease pain sensitivity  OBJECTIVE IMPAIRMENTS: Abnormal gait and difficulty walking.   ACTIVITY LIMITATIONS: squatting and stairs  REHAB POTENTIAL: Excellent  CLINICAL DECISION MAKING: Stable/uncomplicated  EVALUATION COMPLEXITY: Low   GOALS: Goals reviewed with patient? Yes  SHORT TERM GOALS: Target date: 04/25/24 Pt will tolerate full aquatic sessions consistently without increase in pain and with improving function to demonstrate good toleration and effectiveness of intervention.  Baseline: Goal status: Met 04/09/24  2.  Pt will be indep with final aquatic HEP for continued management of condition Baseline:  Goal status: INITIAL  3.  Pt will  report 0/10 pain Baseline:  Goal status: INITIAL  4.  Pt will resume regular exercise program for management of right knee pain and overall general health. Baseline:  Goal status: INITIAL    LONG TERM GOALS: To be set at re-cert if approp    PLAN:  PT FREQUENCY: 1-2x/week  PT DURATION: 4 sessions  PLANNED INTERVENTIONS: 97164- PT Re-evaluation, 97110-Therapeutic exercises, 97530- Therapeutic activity, 97112- Neuromuscular re-education, 97535- Self Care, 56387- Manual therapy, [phone]- Gait training, [phone]- Aquatic Therapy, Patient/Family education, Balance training, Stair training, Taping, Dry Needling, Joint mobilization, DME instructions, Cryotherapy, and Moist heat  PLAN FOR NEXT SESSION: Instruction on HEP for le and core strengthening and ROM   Adriana Hopping Laneta Pintos) [place] Talcott MPT 04/09/24 9:20 AM [facility] [address] [place], [place], [address] Phone: [phone]   Fax:  [phone]

## 2024-04-11 ENCOUNTER — Ambulatory Visit (HOSPITAL_BASED_OUTPATIENT_CLINIC_OR_DEPARTMENT_OTHER): Admitting: Physical Therapy

## 2024-04-11 ENCOUNTER — Encounter (HOSPITAL_BASED_OUTPATIENT_CLINIC_OR_DEPARTMENT_OTHER): Payer: Self-pay | Admitting: Physical Therapy

## 2024-04-11 DIAGNOSIS — G8929 Other chronic pain: Secondary | ICD-10-CM

## 2024-04-11 DIAGNOSIS — M25561 Pain in right knee: Secondary | ICD-10-CM | POA: Diagnosis not present

## 2024-04-11 NOTE — Therapy (Signed)
 OUTPATIENT PHYSICAL THERAPY LOWER EXTREMITY TREATMENT PHYSICAL THERAPY DISCHARGE SUMMARY  Visits from Start of Care: 4  Current functional level related to goals / functional outcomes: indep   Remaining deficits:Oa R knee   Education / Equipment: Management of condition; HEP   Patient agrees to discharge. Patient goals were met. Patient is being discharged due to meeting the stated rehab goals.    Patient Name: Kim Kim MRN: [id number] DOB:1956/10/30, 68 y.o., female Today's Date: 04/11/2024  END OF SESSION:  PT End of Session - 04/11/24 1313     Visit Number 4    Number of Visits 4    Date for PT Re-Evaluation 04/25/24    Authorization Type medicare    PT Start Time 0715    PT Stop Time 0800    PT Time Calculation (min) 45 min    Activity Tolerance Patient tolerated treatment well    Behavior During Therapy WFL for tasks assessed/performed            Past Medical History:  Diagnosis Date   Allergy    seasonal allergies   Anemia    hx of   Carotid atherosclerosis    Epilepsy (HCC)    Gastric ulcer    hx of   GERD (gastroesophageal reflux disease)    on meds   Growth hormone deficiency (HCC)    Hiatal hernia    Hypothyroidism    hx of Hashimoto thyroiditis   Pituitary adenoma (HCC)    hx of   Seizures (HCC)    temporal lobe   Thyroid  disease    on meds   Vitamin D deficiency    Past Surgical History:  Procedure Laterality Date   COLONOSCOPY  12/10/2015   [place]  Dr Altamese Ates   ESOPHAGOGASTRODUODENOSCOPY  06/03/2019   Dr Doneta [place], [place]   WISDOM TOOTH EXTRACTION     Patient Active Problem List   Diagnosis Date Noted   Left anterior knee pain 12/20/2023   Insufficient treatment with nasal CPAP 08/31/2023   OSA on CPAP 04/04/2023   Obesity, Beginning BMI 30.0 04/04/2023   Hashimoto's thyroiditis 03/21/2023   Low bone density 03/21/2023   Pre-diabetes 03/21/2023   Healthcare maintenance 03/14/2023   History of cleft palate  02/12/2023   Sleep apnea treated with nocturnal BiPAP 02/12/2023   BMI 29.0-29.9,adult 01/03/2023   Bacterial overgrowth syndrome 07/19/2021   Prediabetes 01/22/2020   Chronic idiopathic constipation 09/01/2019   Family history of heart disease 12/11/2018   Environmental and seasonal allergies 10/17/2018   B12 deficiency 10/16/2017   Osteoporosis 10/01/2017   Uterine fibroid 07/31/2017   Velopharyngeal insufficiency (VPI), 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   Iron  deficiency 04/27/2016   Deviated nasal septum 12/01/2015   Hypothyroidism 12/17/2012   Insulin  resistance 12/17/2012   Auras 10/15/2012   Pituitary microadenoma (HCC) 01/05/2012   Pulmonary nodules 07/09/2011   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  She is known to have a bone  spur and OA.  She had been traveling out of the country doing a lot of walking. Has not been on a regular exercise program which she has been following over the past year but has kept up walking daily.  Knee pain does not impede any of her mobility.  PERTINENT HISTORY: Hx of right knee oa PAIN:  Are you having pain? Yes: NPRS scale: current 0/10; worst 5/10  Pain location: R knee Pain description: ache Aggravating factors: working out Relieving factors: rest/ walking   PRECAUTIONS: None   WEIGHT BEARING RESTRICTIONS No   FALLS:  Has patient fallen in last 6 months? No Number of falls: 0   LIVING  ENVIRONMENT: Lives with: lives with their family and lives with their spouse Lives in: House/apartment Stairs: No;  Has following equipment at home: none   OCCUPATION: Retired [employer].  Hobbies include exercise, water aerobics, walking, reading, cooking, needle point    PLOF: Independent  PATIENT GOALS: return to regular exercise program  NEXT MD VISIT: as needed  OBJECTIVE:  Note: Objective measures were completed at Evaluation unless otherwise noted.  DIAGNOSTIC FINDINGS: R Knee US  5/25 Impression: No ultrasound evidence of patellar tendinitis but some mild arthritic spurring that may be causing some impingement of the VMO tendon   PATIENT SURVEYS:  LEFS 80/80   COGNITION: Overall cognitive status: Within functional limits for tasks assessed     SENSATION: WFL  PALPATION: Mild TTP about medial knee joint line, pes anserine   LOWER EXTREMITY ROM:  Active ROM Right eval Left eval  Hip flexion    Hip extension    Hip abduction    Hip adduction    Hip internal rotation    Hip external rotation    Knee flexion 130   Knee extension 0   Ankle dorsiflexion    Ankle plantarflexion    Ankle inversion    Ankle eversion     (Blank rows = not tested)  LOWER EXTREMITY MMT:  MMT Right eval Left eval  Hip flexion 59.2 53.5  Hip extension    Hip abduction 34 31.2  Hip adduction    Hip internal rotation    Hip external rotation    Knee flexion    Knee extension 5/5 5/5  Ankle dorsiflexion    Ankle plantarflexion    Ankle inversion    Ankle eversion     (Blank rows = not tested)   FUNCTIONAL TESTS:  5 times sit to stand: 9.19 4 stage balance:passed   GAIT: Distance walked: 500 ft no pain nor limitations with amb                                                                                                                                 TREATMENT  [facility] Adult PT Treatment:  DATE: 04/11/24 Pt seen for  aquatic therapy today.  Treatment took place in water 3.5-4.75 ft in depth at the [facility] pool. Temp of water was 91.  Pt entered/exited the pool via stairs with hand rail.   Exercises - Side to Side Hamstring Stretch with Noodle at [employer]   - Walking Forward Lunge   - Side lunge with hand buoys  - Noodle press   - Plank on [employer] with Arm Lifts  - Standing Hip Hinge  - Runner's Step Up/Down   - Noodle Stomp   - Standing Balance on Noodle at [employer]  - Squat on Noodle - Side to Side Pendulum Swing with Foam Dumbbells and Ankle Floats   - Forward Backward Pendulum Swings with Hip Abduction and Adduction   - Abdominal curls:  Chin on chest/Knees to chest   - Abdominal curls: Chin on chest/Knees to opposite chest  -cycling on noodle  Pt requires the buoyancy and hydrostatic pressure of water for support, and to offload joints by unweighting joint load by at least 50 % in navel deep water and by at least 75-80% in chest to neck deep water.  Viscosity of the water is needed for resistance of strengthening. Water current perturbations provides challenge to standing balance requiring increased core activation.      PATIENT EDUCATION:  Education details: Discussed eval findings, rehab rationale, aquatic program progression/POC and pools in area. Patient is in agreement  Person educated: Patient Education method: Explanation Education comprehension: verbalized understanding  HOME EXERCISE PROGRAM: Access Code: [email or web address] URL: [email or web address] Date: 04/04/2024 Prepared by: Frankie Esaiah Wanless  Laminated, instructed and issued   ASSESSMENT:  CLINICAL IMPRESSION: Pt instructed through last page of HEP.  Verbal and written clarification given and final page issued.  She is indep with program.  Is encouraged to email with any other questions. She reports return to regular exercises participating some in water aerobics here at [facility]. All goals  met.  Pt dc.   OBJECTIVE IMPAIRMENTS: Abnormal gait and difficulty walking.   ACTIVITY LIMITATIONS: squatting and stairs  REHAB POTENTIAL: Excellent  CLINICAL DECISION MAKING: Stable/uncomplicated  EVALUATION COMPLEXITY: Low   GOALS: Goals reviewed with patient? Yes  SHORT TERM GOALS: Target date: 04/25/24 Pt will tolerate full aquatic sessions consistently without increase in pain and with improving function to demonstrate good toleration and effectiveness of intervention.  Baseline: Goal status: Met 04/09/24  2.  Pt will be indep with final aquatic HEP for continued management of condition Baseline:  Goal status: Met 04/11/24  3.  Pt will report 0/10 pain Baseline: Met 04/11/24 Goal status: INITIAL  4.  Pt will resume regular exercise program for management of right knee pain and overall general health. Baseline:  Goal status: Met 04/11/24    LONG TERM GOALS: To be set at re-cert if approp    PLAN:  PT FREQUENCY: 1-2x/week  PT DURATION: 4 sessions  PLANNED INTERVENTIONS: 97164- PT Re-evaluation, 97110-Therapeutic exercises, 97530- Therapeutic activity, 97112- Neuromuscular re-education, 97535- Self Care, 54098- Manual therapy, [phone]- Gait training, [phone]- Aquatic Therapy, Patient/Family education, Balance training, Stair training, Taping, Dry Needling, Joint mobilization, DME instructions, Cryotherapy, and Moist heat  PLAN FOR NEXT SESSION: Instruction on HEP for le and core strengthening and ROM   Adriana Hopping Laneta Pintos) Elaijah Munoz MPT 04/11/24 1:13 PM [facility] [address] [place], [place], [address] Phone: [phone]   Fax:  [phone]

## 2024-04-14 ENCOUNTER — Ambulatory Visit (HOSPITAL_BASED_OUTPATIENT_CLINIC_OR_DEPARTMENT_OTHER): Admitting: Physical Therapy

## 2024-04-16 ENCOUNTER — Ambulatory Visit (HOSPITAL_BASED_OUTPATIENT_CLINIC_OR_DEPARTMENT_OTHER): Admitting: Physical Therapy

## 2024-04-21 ENCOUNTER — Ambulatory Visit (HOSPITAL_BASED_OUTPATIENT_CLINIC_OR_DEPARTMENT_OTHER): Admitting: Physical Therapy

## 2024-04-22 ENCOUNTER — Ambulatory Visit (HOSPITAL_BASED_OUTPATIENT_CLINIC_OR_DEPARTMENT_OTHER): Admitting: Physical Therapy

## 2024-04-23 ENCOUNTER — Ambulatory Visit (HOSPITAL_BASED_OUTPATIENT_CLINIC_OR_DEPARTMENT_OTHER): Admitting: Physical Therapy

## 2024-04-24 ENCOUNTER — Ambulatory Visit (HOSPITAL_BASED_OUTPATIENT_CLINIC_OR_DEPARTMENT_OTHER): Admitting: Physical Therapy

## 2024-05-06 ENCOUNTER — Ambulatory Visit: Payer: Medicare Other | Admitting: Rheumatology

## 2024-05-27 LAB — HM DEXA SCAN

## 2024-06-24 NOTE — Progress Notes (Deleted)
 Office Visit Note  Patient: Kim Kim             Date of Birth: [birth year]           MRN: [id number]             PCP: Rosalynn Camie CROME, MD Referring: Rosalynn Camie CROME, MD Visit Date: 07/08/2024 Occupation: @GUAROCC @  Subjective:  No chief complaint on file.   History of Present Illness: Kim Kim is a 68 y.o. female ***     Activities of Daily Living:  Patient reports morning stiffness for *** {minute/hour:19697}.   Patient {ACTIONS;DENIES/REPORTS:21021675::Denies} nocturnal pain.  Difficulty dressing/grooming: {ACTIONS;DENIES/REPORTS:21021675::Denies} Difficulty climbing stairs: {ACTIONS;DENIES/REPORTS:21021675::Denies} Difficulty getting out of chair: {ACTIONS;DENIES/REPORTS:21021675::Denies} Difficulty using hands for taps, buttons, cutlery, and/or writing: {ACTIONS;DENIES/REPORTS:21021675::Denies}  No Rheumatology ROS completed.   PMFS History:  Patient Active Problem List   Diagnosis Date Noted   Left anterior knee pain 12/20/2023   Insufficient treatment with nasal CPAP 08/31/2023   OSA on CPAP 04/04/2023   Obesity, Beginning BMI 30.0 04/04/2023   Hashimoto's thyroiditis 03/21/2023   Low bone density 03/21/2023   Pre-diabetes 03/21/2023   Healthcare maintenance 03/14/2023   History of cleft palate 02/12/2023   Sleep apnea treated with nocturnal BiPAP 02/12/2023   BMI 29.0-29.9,adult 01/03/2023   Bacterial overgrowth syndrome 07/19/2021   Prediabetes 01/22/2020   Chronic idiopathic constipation 09/01/2019   Family history of heart disease 12/11/2018   Environmental and seasonal allergies 10/17/2018   B12 deficiency 10/16/2017   Osteoporosis 10/01/2017   Uterine fibroid 07/31/2017   Velopharyngeal insufficiency (VPI), 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   Iron  deficiency 04/27/2016   Deviated nasal septum 12/01/2015   Hypothyroidism 12/17/2012   Insulin  resistance 12/17/2012   Auras 10/15/2012   Pituitary microadenoma (HCC) 01/05/2012   Pulmonary nodules  07/09/2011   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   GERD (gastroesophageal reflux disease) 01/01/2009   Vitamin D deficiency 10/07/2007    Past Medical History:  Diagnosis Date   Allergy    seasonal allergies   Anemia    hx of   Carotid atherosclerosis    Epilepsy (HCC)    Gastric ulcer    hx of   GERD (gastroesophageal reflux disease)    on meds   Growth hormone deficiency (HCC)    Hiatal hernia    Hypothyroidism    hx of Hashimoto thyroiditis   Pituitary adenoma (HCC)    hx of   Seizures (HCC)    temporal lobe   Thyroid  disease    on meds   Vitamin D deficiency     Family History  Problem Relation Age of Onset   Hypertension Mother    Heart disease Mother    Diabetes Mother    Thyroid  disease Mother        hypo   Thyroid  nodules Mother    Hyperlipidemia Father    Heart disease Father    Hypertension Father    Hypertension Sister    Hearing loss Sister    Hypertension Sister    Hearing loss Sister    Diabetes Sister    Hypertension Sister    Hearing loss Sister    Heart disease Brother    Esophageal cancer Neg Hx    Colon cancer Neg Hx    Colon polyps Neg Hx    Stomach cancer Neg Hx    Rectal cancer Neg Hx    Past Surgical History:  Procedure Laterality Date   COLONOSCOPY  12/10/2015   [place]  Dr Michelina  ESOPHAGOGASTRODUODENOSCOPY  06/03/2019   Dr Marolyn [place], [place]   WISDOM TOOTH EXTRACTION     Social History   Social History Narrative   She recently moved from [place] . 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   Caffeine- coffee maybe 1 c, some tea   Immunization History  Administered Date(s) Administered   DTaP 10/06/2009   H1N1 11/16/2008   Hepatitis A, Adult 12/05/2016, 08/09/2017   Influenza Inj Mdck Quad Pf 08/13/2017   Influenza Split 08/31/2011, 08/22/2012   Influenza, Quadrivalent, Recombinant, Inj, Pf 08/01/2018   Influenza, Seasonal, Injecte, Preservative Fre 09/04/2013, 10/12/2023    Influenza,inj,Quad PF,6+ Mos 06/26/2019, 08/01/2022   Influenza,inj,quad, With Preservative 08/10/2014, 07/13/2015, 07/11/2016, 08/07/2017   Influenza-Unspecified 10/09/2008, 08/15/2010, 08/10/2014, 07/13/2015, 07/11/2016, 08/05/2020, 08/24/2021   PFIZER Comirnaty ETTERGray Top)Covid-19 Tri-Sucrose Vaccine 08/14/2022   PFIZER(Purple Top)SARS-COV-2 Vaccination 01/03/2020, 01/24/2020, 08/14/2020   PPD Test 05/13/2014, 05/20/2014, 06/28/2015, 07/13/2015, 07/11/2016, 08/07/2017   Pfizer Covid-19 Vaccine Bivalent Booster 24yrs & up 08/01/2021   Tdap 10/06/2009   Zoster Recombinant(Shingrix) 08/13/2017, 06/09/2018     Objective: Vital Signs: There were no vitals taken for this visit.   Physical Exam   Musculoskeletal Exam: ***  CDAI Exam: CDAI Score: -- Patient Global: --; Provider Global: -- Swollen: --; Tender: -- Joint Exam 07/08/2024   No joint exam has been documented for this visit   There is currently no information documented on the homunculus. Go to the Rheumatology activity and complete the homunculus joint exam.  Investigation: No additional findings.  Imaging: No results found.  Recent Labs: Lab Results  Component Value Date   WBC 7.3 11/14/2022   HGB 11.8 (L) 11/14/2022   PLT 195 11/14/2022   NA 138 11/14/2022   K 4.6 11/14/2022   CL 104 11/14/2022   CO2 26 11/14/2022   GLUCOSE 96 11/14/2022   BUN 18 11/14/2022   CREATININE 0.98 11/14/2022   BILITOT 0.3 11/16/2022   ALKPHOS 95 11/16/2022   AST 18 11/16/2022   ALT 11 11/16/2022   PROT 7.8 11/16/2022   ALBUMIN 4.4 11/16/2022   CALCIUM 9.6 11/14/2022    Speciality Comments: No specialty comments available.  Procedures:  No procedures performed Allergies: Patient has no known allergies.   Assessment / Plan:     Visit Diagnoses: No diagnosis found.  Orders: No orders of the defined types were placed in this encounter.  No orders of the defined types were placed in this encounter.   Face-to-face time  spent with patient was *** minutes. Greater than 50% of time was spent in counseling and coordination of care.  Follow-Up Instructions: No follow-ups on file.   Daved JAYSON Gavel, CMA  Note - This record has been created using Animal nutritionist.  Chart creation errors have been sought, but may not always  have been located. Such creation errors do not reflect on  the standard of medical care.

## 2024-07-08 ENCOUNTER — Ambulatory Visit: Payer: Medicare Other | Admitting: Rheumatology

## 2024-07-08 DIAGNOSIS — E559 Vitamin D deficiency, unspecified: Secondary | ICD-10-CM

## 2024-07-08 DIAGNOSIS — E230 Hypopituitarism: Secondary | ICD-10-CM

## 2024-07-08 DIAGNOSIS — Q388 Other congenital malformations of pharynx: Secondary | ICD-10-CM

## 2024-07-08 DIAGNOSIS — M8589 Other specified disorders of bone density and structure, multiple sites: Secondary | ICD-10-CM

## 2024-07-08 DIAGNOSIS — Z8719 Personal history of other diseases of the digestive system: Secondary | ICD-10-CM

## 2024-07-08 DIAGNOSIS — R7303 Prediabetes: Secondary | ICD-10-CM

## 2024-07-08 DIAGNOSIS — G40109 Localization-related (focal) (partial) symptomatic epilepsy and epileptic syndromes with simple partial seizures, not intractable, without status epilepticus: Secondary | ICD-10-CM

## 2024-07-08 DIAGNOSIS — E8881 Metabolic syndrome: Secondary | ICD-10-CM

## 2024-07-08 DIAGNOSIS — R918 Other nonspecific abnormal finding of lung field: Secondary | ICD-10-CM

## 2024-07-08 DIAGNOSIS — E88819 Insulin resistance, unspecified: Secondary | ICD-10-CM

## 2024-07-08 DIAGNOSIS — E538 Deficiency of other specified B group vitamins: Secondary | ICD-10-CM

## 2024-07-08 DIAGNOSIS — E611 Iron deficiency: Secondary | ICD-10-CM

## 2024-07-08 DIAGNOSIS — K6389 Other specified diseases of intestine: Secondary | ICD-10-CM

## 2024-07-08 DIAGNOSIS — M19041 Primary osteoarthritis, right hand: Secondary | ICD-10-CM

## 2024-07-08 DIAGNOSIS — D352 Benign neoplasm of pituitary gland: Secondary | ICD-10-CM

## 2024-07-08 DIAGNOSIS — E063 Autoimmune thyroiditis: Secondary | ICD-10-CM

## 2024-07-08 DIAGNOSIS — Z8711 Personal history of peptic ulcer disease: Secondary | ICD-10-CM

## 2024-07-08 DIAGNOSIS — K5904 Chronic idiopathic constipation: Secondary | ICD-10-CM

## 2024-07-08 DIAGNOSIS — R948 Abnormal results of function studies of other organs and systems: Secondary | ICD-10-CM

## 2024-07-08 DIAGNOSIS — L2084 Intrinsic (allergic) eczema: Secondary | ICD-10-CM

## 2024-07-14 ENCOUNTER — Telehealth: Payer: Self-pay

## 2024-07-14 NOTE — Telephone Encounter (Signed)
 Received DEXA results from [facility].  Date of Scan: 05/27/2024  Lowest T-score: -2.3  BMD: 0.716  Lowest site measured: left femur neck  DX: osteopenia   Significant changes in BMD and site measured (5% and above): 8% AP spine (L1-L4), 7.5% AP spine (L2-L4).   Current Regimen: vitamin D per last OV note.   Recommendation: discuss at follow up, per Dr. Dolphus.   Reviewed by: Dr. Dolphus  Next Appointment:  08/25/2024   Copy sent to the scan center and original in upcoming appointment folder folder at [facility] desk.

## 2024-08-11 NOTE — Progress Notes (Signed)
 Office Visit Note  Patient: Kim Kim             Date of Birth: [birth year]           MRN: [id number]             PCP: Rosalynn Camie CROME, MD Referring: Rosalynn Camie CROME, MD Visit Date: 08/25/2024 Occupation: Data Unavailable  Subjective:  Discuss DEXA scan  History of Present Illness: Kim Kim is a 68 y.o. female with osteopenia.  She returns today after her last visit.  June 2024.  She has been taking vitamin D and vitamin K.  She has been going for weight training 3 times a week and water aerobics.  She walks about 2 miles daily.  She had a recent DEXA scan and wants to discuss results.  Patient states that she gets some stiffness in her hands and also in her right knee when she is working out.    Activities of Daily Living:  Patient reports morning stiffness for 0 minutes.   Patient Denies nocturnal pain.  Difficulty dressing/grooming: Denies Difficulty climbing stairs: Denies Difficulty getting out of chair: Denies Difficulty using hands for taps, buttons, cutlery, and/or writing: Denies  Review of Systems  Constitutional:  Negative for fatigue.  HENT:  Positive for mouth dryness. Negative for mouth sores.   Eyes:  Positive for dryness.  Respiratory:  Negative for shortness of breath.   Cardiovascular:  Negative for chest pain and palpitations.  Gastrointestinal:  Negative for blood in stool, constipation and diarrhea.  Endocrine: Negative for increased urination.  Genitourinary:  Negative for involuntary urination.  Musculoskeletal:  Positive for joint pain and joint pain. Negative for gait problem, joint swelling, myalgias, muscle weakness, morning stiffness, muscle tenderness and myalgias.  Skin:  Negative for color change, rash, hair loss and sensitivity to sunlight.  Allergic/Immunologic: Negative for susceptible to infections.  Neurological:  Negative for dizziness and headaches.  Hematological:  Negative for swollen glands.  Psychiatric/Behavioral:  Negative for  depressed mood and sleep disturbance. The patient is not nervous/anxious.     PMFS History:  Patient Active Problem List   Diagnosis Date Noted   Left anterior knee pain 12/20/2023   Insufficient treatment with nasal CPAP 08/31/2023   OSA on CPAP 04/04/2023   Obesity, Beginning BMI 30.0 04/04/2023   Hashimoto's thyroiditis 03/21/2023   Low bone density 03/21/2023   Pre-diabetes 03/21/2023   Healthcare maintenance 03/14/2023   History of cleft palate 02/12/2023   Sleep apnea treated with nocturnal BiPAP 02/12/2023   BMI 29.0-29.9,adult 01/03/2023   Bacterial overgrowth syndrome 07/19/2021   Prediabetes 01/22/2020   Chronic idiopathic constipation 09/01/2019   Family history of heart disease 12/11/2018   Environmental and seasonal allergies 10/17/2018   B12 deficiency 10/16/2017   Osteoporosis 10/01/2017   Uterine fibroid 07/31/2017   Velopharyngeal insufficiency (VPI), 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   Iron  deficiency 04/27/2016   Deviated nasal septum 12/01/2015   Hypothyroidism 12/17/2012   Insulin  resistance 12/17/2012   Auras 10/15/2012   Pituitary microadenoma (HCC) 01/05/2012   Pulmonary nodules 07/09/2011   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   GERD (gastroesophageal reflux disease) 01/01/2009   Vitamin D deficiency 10/07/2007    Past Medical History:  Diagnosis Date   Allergy    seasonal allergies   Anemia    hx of   Carotid atherosclerosis    Epilepsy (HCC)    Gastric ulcer    hx of   GERD (gastroesophageal reflux disease)    on  meds   Growth hormone deficiency    Hiatal hernia    Hypothyroidism    hx of Hashimoto thyroiditis   Pituitary adenoma (HCC)    hx of   Seizures (HCC)    temporal lobe   Thyroid  disease    on meds   Vitamin D deficiency     Family History  Problem Relation Age of Onset   Hypertension Mother    Heart disease Mother    Diabetes Mother    Thyroid  disease Mother        hypo   Thyroid  nodules Mother    Hyperlipidemia  Father    Heart disease Father    Hypertension Father    Hypertension Sister    Hearing loss Sister    Hypertension Sister    Hearing loss Sister    Diabetes Sister    Hypertension Sister    Hearing loss Sister    Heart disease Brother    Esophageal cancer Neg Hx    Colon cancer Neg Hx    Colon polyps Neg Hx    Stomach cancer Neg Hx    Rectal cancer Neg Hx    Past Surgical History:  Procedure Laterality Date   COLONOSCOPY  12/10/2015   [place]  Dr Michelina   ESOPHAGOGASTRODUODENOSCOPY  06/03/2019   Dr Marolyn [place], [place]   WISDOM TOOTH EXTRACTION     Social History   Tobacco Use   Smoking status: Never    Passive exposure: Never   Smokeless tobacco: Never  Vaping Use   Vaping status: Never Used  Substance Use Topics   Alcohol use: Not Currently   Drug use: Never   Social History   Social History Narrative   She recently moved from [place] . 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   Caffeine- coffee maybe 1 c, some tea     Immunization History  Administered Date(s) Administered   DTaP 10/06/2009   H1N1 11/16/2008   Hepatitis A, Adult 12/05/2016, 08/09/2017   INFLUENZA, HIGH DOSE SEASONAL PF 08/15/2024   Influenza Inj Mdck Quad Pf 08/13/2017   Influenza Split 08/31/2011, 08/22/2012   Influenza, Quadrivalent, Recombinant, Inj, Pf 08/01/2018   Influenza, Seasonal, Injecte, Preservative Fre 09/04/2013, 10/12/2023   Influenza,inj,Quad PF,6+ Mos 06/26/2019, 08/01/2022   Influenza,inj,quad, With Preservative 08/10/2014, 07/13/2015, 07/11/2016, 08/07/2017   Influenza-Unspecified 10/09/2008, 08/15/2010, 08/10/2014, 07/13/2015, 07/11/2016, 08/05/2020, 08/24/2021   PFIZER Comirnaty ETTERGray Top)Covid-19 Tri-Sucrose Vaccine 08/14/2022   PFIZER(Purple Top)SARS-COV-2 Vaccination 01/03/2020, 01/24/2020, 08/14/2020   PPD Test 05/13/2014, 05/20/2014, 06/28/2015, 07/13/2015, 07/11/2016, 08/07/2017   Pfizer Covid-19 Vaccine Bivalent Booster 13yrs & up  08/01/2021   Pfizer(Comirnaty )Fall Seasonal Vaccine 12 years and older 08/20/2024   Tdap 10/06/2009   Zoster Recombinant(Shingrix) 08/13/2017, 06/09/2018     Objective: Vital Signs: BP 125/84   Pulse 71   Temp 97.7 F (36.5 C)   Resp 17   Ht 5' 4 (1.626 m)   Wt 176 lb (79.8 kg)   BMI 30.21 kg/m    Physical Exam Vitals and nursing note reviewed.  Constitutional:      Appearance: She is well-developed.  HENT:     Head: Normocephalic and atraumatic.  Eyes:     Conjunctiva/sclera: Conjunctivae normal.  Cardiovascular:     Rate and Rhythm: Normal rate and regular rhythm.     Heart sounds: Normal heart sounds.  Pulmonary:     Effort: Pulmonary effort is normal.     Breath sounds: Normal breath sounds.  Abdominal:  General: Bowel sounds are normal.     Palpations: Abdomen is soft.  Musculoskeletal:     Cervical back: Normal range of motion.  Lymphadenopathy:     Cervical: No cervical adenopathy.  Skin:    General: Skin is warm and dry.     Capillary Refill: Capillary refill takes less than 2 seconds.  Neurological:     Mental Status: She is alert and oriented to person, place, and time.  Psychiatric:        Behavior: Behavior normal.      Musculoskeletal Exam: Cervical, thoracic and lumbar spine were in good range of motion.  There was no SI joint tenderness.  Shoulder joints, elbow joints, wrist joints, MCPs, PIPs and DIPs were in good range of motion with no synovitis.  Bilateral PIP and DIP thickening was noted.  Hip joints and knee joints were in good range of motion without any warmth swelling or effusion.  She had crepitus in her right knee.  There was no tenderness over ankles or MTPs.   CDAI Exam: CDAI Score: -- Patient Global: --; Provider Global: -- Swollen: --; Tender: -- Joint Exam 08/25/2024   No joint exam has been documented for this visit   There is currently no information documented on the homunculus. Go to the Rheumatology activity and  complete the homunculus joint exam.  Investigation: No additional findings.  Imaging: DG Wrist Complete Left Result Date: 08/15/2024 CLINICAL DATA:  wrist pain EXAM: LEFT WRIST - COMPLETE 3+ VIEW COMPARISON:  None Available. FINDINGS: No acute fracture or dislocation. Moderate joint space loss of the first carpometacarpal joint. Soft tissues are unremarkable. IMPRESSION: 1. No acute fracture or dislocation. 2. Moderate osteoarthritis at the base of the thumb. Electronically Signed   By: Rogelia Myers M.D.   On: 08/15/2024 20:19    Recent Labs: Lab Results  Component Value Date   WBC 7.3 11/14/2022   HGB 11.8 (L) 11/14/2022   PLT 195 11/14/2022   NA 138 11/14/2022   K 4.6 11/14/2022   CL 104 11/14/2022   CO2 26 11/14/2022   GLUCOSE 96 11/14/2022   BUN 18 11/14/2022   CREATININE 0.98 11/14/2022   BILITOT 0.3 11/16/2022   ALKPHOS 95 11/16/2022   AST 18 11/16/2022   ALT 11 11/16/2022   PROT 7.8 11/16/2022   ALBUMIN 4.4 11/16/2022   CALCIUM 9.6 11/14/2022      Speciality Comments: No specialty comments available.  Procedures:  No procedures performed Allergies: Patient has no known allergies.   Assessment / Plan:     Visit Diagnoses: Osteopenia of multiple sites - 04/27/2022: RFN BMD 0.707 with T-score -2.4.  May 27, 2024 DEXA scan T-score -2.3, BMD 0.721 left femoral neck +1.5%, AP spine +8%, right femoral neck 2.7%, total femur 0.1%.  I had a detailed discussion with the patient regarding the DEXA results.  Overall her DEXA results shows a stable may be slightly better bone density.  She decided not to go on IV Reclast at the last visit.  She has been taking vitamin D and vitamin K.  She is also doing weight training and exercising on a regular basis.  I discussed the option of doing bisphosphonates but she would like to hold off.  I advised her to repeat DEXA scan in 2 years.  Vitamin D deficiency-patient states she has been taking vitamin D.  Primary osteoarthritis of  both hands-she had bilateral DIP and PIP thickening.  She been using some braces.  Joint protection muscle  strengthening was discussed.  A handout on exercises was given.  Other medical problems are listed as follows:  Pulmonary nodules  Chronic idiopathic constipation  Bacterial overgrowth syndrome  History of gastroesophageal reflux (GERD) - per patient she had endoscopy 2 years ago which was negative for gastritis.  Patient states she is taking omeprazole  since 2020.  Insulin  resistance  History of gastric ulcer  Pituitary microadenoma (HCC)  Hashimoto's thyroiditis  Growth hormone deficiency - patient is followed by endocrinologist.  Dysmetabolic syndrome X  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HCC)  Velopharyngeal insufficiency (VPI), congenital  Prediabetes  Intrinsic eczema  Iron  deficiency  B12 deficiency  Basal metabolism function study abnormality  Orders: No orders of the defined types were placed in this encounter.  No orders of the defined types were placed in this encounter.   Follow-Up Instructions: Return in about 2 years (around 08/25/2026) for osteopenia.   Maya Nash, MD  Note - This record has been created using Animal nutritionist.  Chart creation errors have been sought, but may not always  have been located. Such creation errors do not reflect on  the standard of medical care.

## 2024-08-15 ENCOUNTER — Encounter: Payer: Self-pay | Admitting: Family Medicine

## 2024-08-15 ENCOUNTER — Other Ambulatory Visit (HOSPITAL_BASED_OUTPATIENT_CLINIC_OR_DEPARTMENT_OTHER): Payer: Self-pay

## 2024-08-15 ENCOUNTER — Ambulatory Visit: Payer: Self-pay

## 2024-08-15 ENCOUNTER — Ambulatory Visit: Admitting: Family Medicine

## 2024-08-15 ENCOUNTER — Other Ambulatory Visit: Payer: Self-pay | Admitting: Family Medicine

## 2024-08-15 ENCOUNTER — Ambulatory Visit (HOSPITAL_BASED_OUTPATIENT_CLINIC_OR_DEPARTMENT_OTHER)
Admission: RE | Admit: 2024-08-15 | Discharge: 2024-08-15 | Disposition: A | Discharge status: Home / Self Care | Source: Ambulatory Visit | Attending: Family Medicine | Admitting: Family Medicine

## 2024-08-15 VITALS — BP 118/74 | Ht 64.0 in | Wt 170.0 lb

## 2024-08-15 DIAGNOSIS — H60391 Other infective otitis externa, right ear: Secondary | ICD-10-CM

## 2024-08-15 DIAGNOSIS — M25532 Pain in left wrist: Secondary | ICD-10-CM

## 2024-08-15 MED ORDER — HYDROCORTISONE-ACETIC ACID 1-2 % OT SOLN: 3.0000 [drp] | Freq: Two times a day (BID) | OTIC | 0 refills | Status: DC

## 2024-08-15 MED ORDER — FLUZONE HIGH-DOSE 0.5 ML IM SUSY
0.5000 mL | PREFILLED_SYRINGE | Freq: Once | INTRAMUSCULAR | 0 refills | Status: AC
  Filled 2024-08-15: qty 0.5, 1d supply, fill #0

## 2024-08-15 NOTE — Progress Notes (Signed)
 PCP: Rosalynn Camie CROME, MD  History of Present Illness  Kim Kim is a 68 year old female who presents to [facility] for evaluation of left wrist pain.  She fell in a hotel bathroom 2 days ago, landing on her left wrist.  Since that time she has experienced ulnar-sided pain in her left wrist.  She tried applying Voltaren gel last evening, which was helpful.  Pain is worse with radial deviation of the wrist.  No prior history of injury to the left wrist.  Denies numbness or weakness in the left hand.   Past Medical History:  Diagnosis Date   Allergy    seasonal allergies   Anemia    hx of   Carotid atherosclerosis    Epilepsy (HCC)    Gastric ulcer    hx of   GERD (gastroesophageal reflux disease)    on meds   Growth hormone deficiency    Hiatal hernia    Hypothyroidism    hx of Hashimoto thyroiditis   Pituitary adenoma (HCC)    hx of   Seizures (HCC)    temporal lobe   Thyroid  disease    on meds   Vitamin D deficiency     Current Outpatient Medications on File Prior to Visit  Medication Sig Dispense Refill   desonide  (DESOWEN ) 0.05 % cream Apply 1 Application topically as needed. 60 g 3   LAMICTAL  100 MG tablet Take 1 tablet (100 mg total) by mouth 2 (two) times daily. 180 tablet 3   levothyroxine (SYNTHROID ) 100 MCG tablet Take 100 mcg by mouth daily before breakfast.     omeprazole  (PRILOSEC ) 20 MG capsule Take 20 mg by mouth daily.     rosuvastatin (CRESTOR) 10 MG tablet Take 10 mg by mouth at bedtime.     No current facility-administered medications on file prior to visit.    Past Surgical History:  Procedure Laterality Date   COLONOSCOPY  12/10/2015   [place]  Dr Michelina   ESOPHAGOGASTRODUODENOSCOPY  06/03/2019   Dr Marolyn [place], [place]   WISDOM TOOTH EXTRACTION      No Known Allergies  BP 118/74   Ht 5' 4 (1.626 m)   Wt 170 lb (77.1 kg)   BMI 29.18 kg/m      09/14/2021    2:12 PM  [facility]  Frequency of aerobic exercise (# of  days/week) 4  Average time in minutes 60  Frequency of strengthening activities (# of days/week) 4        No data to display              Objective:  Physical Exam:  Gen: NAD, comfortable in exam room  Left wrist No obvious deformity on inspection TTP along the ulnar wrist, over TFCC.  No TTP over the ulnar styloid or pisiform ROM is generally preserved but she endorses pain with radial deviation The left hand is grossly neurovascularly intact  MSK US  Left Wrist Ultrasound evaluation of the ulnar aspect of the left wrist shows increased hypoechoic fluid over the triangular fibrocartilage complex with no evidence of frank tear.   Assessment and Plan Assessment & Plan  Left wrist injury Patient had a FOOSH injury 2 days ago in a hotel bathroom.  She has had left ulnar-sided wrist pain since that time.  No bony tenderness on exam.  Pain is worse with radial deviation of the wrist.  Limited ultrasound shows inflammation overlying TFCC with no evidence of frank tear.  Treatment options reviewed.  Recommend using a wrist brace for now.  NSAIDs and icing as needed for pain relief.  Will check x-rays to rule out fracture.  She will follow-up pending x-rays results and if pain worsens or fails to improve.

## 2024-08-16 ENCOUNTER — Ambulatory Visit: Payer: Self-pay | Admitting: Family Medicine

## 2024-08-16 NOTE — Progress Notes (Signed)
 [facility]: Attending Note: I have examined the patient, reviewed the chart, discussed the assessment and plan with the Sports Medicine Fellow. I agree with assessment and treatment plan as detailed in the Fellow's note. HPI/EXAM -FOOSH type injury to left wrist with ulnar sided pain. Moderate pain with ulnar deviation, ttp over TFCC.Non dominant hand - US  today shows a small amount of fluid but no obvious bony irregularity. Will get X ray for completion of initial workup.  A/P: left wrist injury with pain and contusion,cannot rule out occult fracture and concern for possible TFCC injury.  - given mechanism of injury with ulnar sided pain right over area of TFCC, I want to follow this closely and assure normal healing.   Will get formal xray of wrist If not improving significantly in 2-3 weeks, would consider MRI to evaluate TFCC. - wrist brace given and discusses, HEP and activity modification discussed. RTC 2-3 weeks  2. HPI/Exam Ear canal irritation on right. Has noticed for about 2-3 weeks. Bothering her. EXAM with otoscope: some dryness with flaky skin in EAC on right only. Normal TM bilaterally. Hearing grossly intact. NECK without LAD.  A/P: Otitis externa mild,  appears unilateral at this time -Rx for Vosol otic. Discussed ear canal hygiene. RTC if not improving in 3 weeks. If it resolves and then recurs in next few months she should also rtc. -

## 2024-08-20 ENCOUNTER — Other Ambulatory Visit (HOSPITAL_BASED_OUTPATIENT_CLINIC_OR_DEPARTMENT_OTHER): Payer: Self-pay

## 2024-08-20 MED ORDER — COMIRNATY 30 MCG/0.3ML IM SUSY
0.3000 mL | PREFILLED_SYRINGE | Freq: Once | INTRAMUSCULAR | 0 refills | Status: AC
  Filled 2024-08-20: qty 0.3, 1d supply, fill #0

## 2024-08-25 ENCOUNTER — Encounter: Payer: Self-pay | Admitting: Rheumatology

## 2024-08-25 ENCOUNTER — Ambulatory Visit: Attending: Rheumatology | Admitting: Rheumatology

## 2024-08-25 VITALS — BP 125/84 | HR 71 | Temp 97.7°F | Resp 17 | Ht 64.0 in | Wt 176.0 lb

## 2024-08-25 DIAGNOSIS — Z8719 Personal history of other diseases of the digestive system: Secondary | ICD-10-CM | POA: Diagnosis present

## 2024-08-25 DIAGNOSIS — K6389 Other specified diseases of intestine: Secondary | ICD-10-CM | POA: Diagnosis present

## 2024-08-25 DIAGNOSIS — Z8711 Personal history of peptic ulcer disease: Secondary | ICD-10-CM | POA: Insufficient documentation

## 2024-08-25 DIAGNOSIS — L2084 Intrinsic (allergic) eczema: Secondary | ICD-10-CM | POA: Insufficient documentation

## 2024-08-25 DIAGNOSIS — G40109 Localization-related (focal) (partial) symptomatic epilepsy and epileptic syndromes with simple partial seizures, not intractable, without status epilepticus: Secondary | ICD-10-CM | POA: Diagnosis present

## 2024-08-25 DIAGNOSIS — R7303 Prediabetes: Secondary | ICD-10-CM | POA: Diagnosis present

## 2024-08-25 DIAGNOSIS — Q388 Other congenital malformations of pharynx: Secondary | ICD-10-CM | POA: Insufficient documentation

## 2024-08-25 DIAGNOSIS — E063 Autoimmune thyroiditis: Secondary | ICD-10-CM | POA: Insufficient documentation

## 2024-08-25 DIAGNOSIS — E559 Vitamin D deficiency, unspecified: Secondary | ICD-10-CM | POA: Insufficient documentation

## 2024-08-25 DIAGNOSIS — M19041 Primary osteoarthritis, right hand: Secondary | ICD-10-CM | POA: Insufficient documentation

## 2024-08-25 DIAGNOSIS — E538 Deficiency of other specified B group vitamins: Secondary | ICD-10-CM | POA: Insufficient documentation

## 2024-08-25 DIAGNOSIS — R918 Other nonspecific abnormal finding of lung field: Secondary | ICD-10-CM | POA: Insufficient documentation

## 2024-08-25 DIAGNOSIS — M8589 Other specified disorders of bone density and structure, multiple sites: Secondary | ICD-10-CM | POA: Insufficient documentation

## 2024-08-25 DIAGNOSIS — E611 Iron deficiency: Secondary | ICD-10-CM | POA: Diagnosis present

## 2024-08-25 DIAGNOSIS — R948 Abnormal results of function studies of other organs and systems: Secondary | ICD-10-CM | POA: Insufficient documentation

## 2024-08-25 DIAGNOSIS — M19042 Primary osteoarthritis, left hand: Secondary | ICD-10-CM | POA: Insufficient documentation

## 2024-08-25 DIAGNOSIS — E230 Hypopituitarism: Secondary | ICD-10-CM | POA: Insufficient documentation

## 2024-08-25 DIAGNOSIS — D352 Benign neoplasm of pituitary gland: Secondary | ICD-10-CM | POA: Insufficient documentation

## 2024-08-25 DIAGNOSIS — E8881 Metabolic syndrome: Secondary | ICD-10-CM | POA: Insufficient documentation

## 2024-08-25 DIAGNOSIS — K5904 Chronic idiopathic constipation: Secondary | ICD-10-CM | POA: Diagnosis present

## 2024-08-25 DIAGNOSIS — E88819 Insulin resistance, unspecified: Secondary | ICD-10-CM | POA: Diagnosis present

## 2024-08-25 NOTE — Patient Instructions (Signed)
 Exercises for Chronic Knee Pain Chronic knee pain is pain that lasts longer than 3 months. For most people with chronic knee pain, exercise and weight loss is an important part of treatment. Your health care provider may want you to focus on: Making the muscles that support your knee stronger. This can take pressure off your knee and reduce pain. Preventing knee stiffness. How far you can move your knee, keeping it there or making it farther. Losing weight (if this applies) to take pressure off your knee, lower your risk for injury, and make it easier for you to exercise. Your provider will help you make an exercise program that fits your needs and physical abilities. Below are simple, low-impact exercises you can do at home. Ask your provider or physical therapist how often you should do your exercise program and how many times to repeat each exercise. General safety tips  Get your provider's approval before doing any exercises. Start slowly and stop any time you feel pain. Do not exercise if your knee pain is flaring up. Warm up first. Stretching a cold muscle can cause an injury. Do 5-10 minutes of easy movement or light stretching before beginning your exercises. Do 5-10 minutes of low-impact activity (like walking or cycling) before starting strengthening exercises. Contact your provider any time you have pain during or after exercising. Exercise can cause discomfort but should not be painful. It is normal to be a little stiff or sore after exercising. Stretching and range-of-motion exercises Front thigh stretch  Stand up straight and support your body by holding on to a chair or resting one hand on a wall. With your legs straight and close together, bend one knee to lift your heel up toward your butt. Using one hand for support, grab your ankle with your free hand. Pull your foot up closer toward your butt to feel the stretch in front of your thigh. Hold the stretch for 30  seconds. Repeat __________ times. Complete this exercise __________ times a day. Back thigh stretch  Sit on the floor with your back straight and your legs out straight in front of you. Place the palms of your hands on the floor and slide them toward your feet as you bend at the hip. Try to touch your nose to your knees and feel the stretch in the back of your thighs. Hold for 30 seconds. Repeat __________ times. Complete this exercise __________ times a day. Calf stretch  Stand facing a wall. Place the palms of your hands flat against the wall, arms extended, and lean slightly against the wall. Get into a lunge position with one leg bent at the knee and the other leg stretched out straight behind you. Keep both feet facing the wall and increase the bend in your knee while keeping the heel of the other leg flat on the ground. You should feel the stretch in your calf. Hold for 30 seconds. Repeat __________ times. Complete this exercise __________ times a day. Strengthening exercises Straight leg lift  Lie on your back with one knee bent and the other leg out straight. Slowly lift the straight leg without bending the knee. Lift until your foot is about 12 inches (30 cm) off the floor. Hold for 3-5 seconds and slowly lower your leg. Repeat __________ times. Complete this exercise __________ times a day. Single leg dip  Stand between two chairs and put both hands on the backs of the chairs for support. Extend one leg out straight with your body  weight resting on the heel of the standing leg. Slowly bend your standing knee to dip your body to the level that is comfortable for you. Hold for 3-5 seconds. Repeat __________ times. Complete this exercise __________ times a day. Hamstring curls  Stand straight, knees close together, facing the back of a chair. Hold on to the back of a chair with both hands. Keep one leg straight. Bend the other knee while bringing the heel up toward the butt  until the knee is bent at a 90-degree angle (right angle). Hold for 3-5 seconds. Repeat __________ times. Complete this exercise __________ times a day. Wall squat  Stand straight with your back, hips, and head against a wall. Step forward one foot at a time with your back still against the wall. Your feet should be 2 feet (61 cm) from the wall at shoulder width. Keeping your back, hips, and head against the wall, slide down the wall to as close to a sitting position as you can get. Hold for 5-10 seconds, then slowly slide back up. Repeat __________ times. Complete this exercise __________ times a day. Step-ups  Stand in front of a sturdy platform or stool that is about 6 inches (15 cm) high. Slowly step up with your left / right foot, keeping your knee in line with your hip and foot. Do not let your knee bend so far that you cannot see your toes. Hold on to a chair for balance, but do not use it for support. Slowly unlock your knee and lower yourself to the starting position. Repeat __________ times. Complete this exercise __________ times a day. Contact a health care provider if: Your exercises cause pain. Your pain is worse after you exercise. Your pain prevents you from doing your exercises. This information is not intended to replace advice given to you by your health care provider. Make sure you discuss any questions you have with your health care provider. Document Revised: 10/31/2022 Document Reviewed: 10/31/2022 [employer] Patient Education  2024 [employer]  Hand Exercises Hand exercises can be helpful for almost anyone. They can strengthen your hands and improve flexibility and movement. The exercises can also increase blood flow to the hands. These results can make your work and daily tasks easier for you. Hand exercises can be especially helpful for people who have joint pain from arthritis or nerve damage from using their hands over and over. These exercises can also help  people who injure a hand. Exercises Most of these hand exercises are gentle stretching and motion exercises. It is usually safe to do them often throughout the day. Warming up your hands before exercise may help reduce stiffness. You can do this with gentle massage or by placing your hands in warm water for 10-15 minutes. It is normal to feel some stretching, pulling, tightness, or mild discomfort when you begin new exercises. In time, this will improve. Remember to always be careful and stop right away if you feel sudden, very bad pain or your pain gets worse. You want to get better and be safe. Ask your health care provider which exercises are safe for you. Do exercises exactly as told by your provider and adjust them as told. Do not begin these exercises until told by your provider. Knuckle bend or "claw" fist  Stand or sit with your arm, hand, and all five fingers pointed straight up. Make sure to keep your wrist straight. Gently bend your fingers down toward your palm until the tips of your  fingers are touching your palm. Keep your big knuckle straight and only bend the small knuckles in your fingers. Hold this position for 10 seconds. Straighten your fingers back to your starting position. Repeat this exercise 5-10 times with each hand. Full finger fist  Stand or sit with your arm, hand, and all five fingers pointed straight up. Make sure to keep your wrist straight. Gently bend your fingers into your palm until the tips of your fingers are touching the middle of your palm. Hold this position for 10 seconds. Extend your fingers back to your starting position, stretching every joint fully. Repeat this exercise 5-10 times with each hand. Straight fist  Stand or sit with your arm, hand, and all five fingers pointed straight up. Make sure to keep your wrist straight. Gently bend your fingers at the big knuckle, where your fingers meet your hand, and at the middle knuckle. Keep the knuckle at  the tips of your fingers straight and try to touch the bottom of your palm. Hold this position for 10 seconds. Extend your fingers back to your starting position, stretching every joint fully. Repeat this exercise 5-10 times with each hand. Tabletop  Stand or sit with your arm, hand, and all five fingers pointed straight up. Make sure to keep your wrist straight. Gently bend your fingers at the big knuckle, where your fingers meet your hand, as far down as you can. Keep the small knuckles in your fingers straight. Think of forming a tabletop with your fingers. Hold this position for 10 seconds. Extend your fingers back to your starting position, stretching every joint fully. Repeat this exercise 5-10 times with each hand. Finger spread  Place your hand flat on a table with your palm facing down. Make sure your wrist stays straight. Spread your fingers and thumb apart from each other as far as you can until you feel a gentle stretch. Hold this position for 10 seconds. Bring your fingers and thumb tight together again. Hold this position for 10 seconds. Repeat this exercise 5-10 times with each hand. Making circles  Stand or sit with your arm, hand, and all five fingers pointed straight up. Make sure to keep your wrist straight. Make a circle by touching the tip of your thumb to the tip of your index finger. Hold for 10 seconds. Then open your hand wide. Repeat this motion with your thumb and each of your fingers. Repeat this exercise 5-10 times with each hand. Thumb motion  Sit with your forearm resting on a table and your wrist straight. Your thumb should be facing up toward the ceiling. Keep your fingers relaxed as you move your thumb. Lift your thumb up as high as you can toward the ceiling. Hold for 10 seconds. Bend your thumb across your palm as far as you can, reaching the tip of your thumb for the small finger (pinkie) side of your palm. Hold for 10 seconds. Repeat this exercise  5-10 times with each hand. Grip strengthening  Hold a stress ball or other soft ball in the middle of your hand. Slowly increase the pressure, squeezing the ball as much as you can without causing pain. Think of bringing the tips of your fingers into the middle of your palm. All of your finger joints should bend when doing this exercise. Hold your squeeze for 10 seconds, then relax. Repeat this exercise 5-10 times with each hand. Contact a health care provider if: Your hand pain or discomfort gets much worse when  you do an exercise. Your hand pain or discomfort does not improve within 2 hours after you exercise. If you have either of these problems, stop doing these exercises right away. Do not do them again unless your provider says that you can. Get help right away if: You develop sudden, severe hand pain or swelling. If this happens, stop doing these exercises right away. Do not do them again unless your provider says that you can. This information is not intended to replace advice given to you by your health care provider. Make sure you discuss any questions you have with your health care provider. Document Revised: 10/31/2022 Document Reviewed: 10/31/2022 Elsevier Patient Education  2024 [employer].

## 2024-10-09 ENCOUNTER — Encounter: Payer: Self-pay | Admitting: Cardiology

## 2024-10-16 ENCOUNTER — Telehealth: Payer: Self-pay | Admitting: Neurology

## 2024-10-16 NOTE — Telephone Encounter (Signed)
 Request to r/s due to conflict

## 2024-11-17 ENCOUNTER — Telehealth: Payer: Self-pay | Admitting: Neurology

## 2024-11-17 ENCOUNTER — Ambulatory Visit: Payer: Medicare Other | Admitting: Diagnostic Neuroimaging

## 2024-11-17 NOTE — Telephone Encounter (Signed)
 Patient calling to reschedule appointments due will be [place] of the [place] and will not be back until March. Patient decided to keep appointments as is.

## 2024-11-17 NOTE — Telephone Encounter (Signed)
 Patient requesting a call from PA Team to discuss authorization for LAMICTAL  100 MG tablet. Taking the brand name for Lamictal , want make get authorization for this year.

## 2024-11-19 ENCOUNTER — Telehealth: Payer: Self-pay

## 2024-11-19 ENCOUNTER — Other Ambulatory Visit (HOSPITAL_COMMUNITY): Payer: Self-pay

## 2024-11-19 NOTE — Telephone Encounter (Signed)
 Pharmacy Patient Advocate Encounter   Received notification from Pt Calls Messages that prior authorization for LaMICtal  100MG  tablets is required/requested.   Insurance verification completed.   The patient is insured through [facility].   Per test claim: Refill too soon. PA is not needed at this time. Medication was filled 09-29-2024. Next eligible fill date is 12-05-2024.

## 2024-11-19 NOTE — Telephone Encounter (Signed)
 Prior authorization not required. Too soon to fill until 12-05-2024

## 2024-11-19 NOTE — Telephone Encounter (Signed)
 Patient has been notified

## 2024-12-01 ENCOUNTER — Ambulatory Visit: Admitting: Diagnostic Neuroimaging

## 2024-12-04 ENCOUNTER — Encounter: Payer: Self-pay | Admitting: Neurology

## 2024-12-04 ENCOUNTER — Ambulatory Visit: Payer: Medicare Other | Admitting: Family Medicine

## 2024-12-04 ENCOUNTER — Ambulatory Visit: Payer: Medicare Other | Admitting: Neurology

## 2024-12-04 VITALS — BP 117/79 | HR 87 | Ht 64.0 in | Wt 178.0 lb

## 2024-12-04 DIAGNOSIS — Z9989 Dependence on other enabling machines and devices: Secondary | ICD-10-CM

## 2024-12-04 DIAGNOSIS — G4730 Sleep apnea, unspecified: Secondary | ICD-10-CM

## 2024-12-04 DIAGNOSIS — G40109 Localization-related (focal) (partial) symptomatic epilepsy and epileptic syndromes with simple partial seizures, not intractable, without status epilepticus: Secondary | ICD-10-CM

## 2024-12-04 MED ORDER — LAMICTAL 100 MG PO TABS: 100.0000 mg | ORAL_TABLET | Freq: Two times a day (BID) | ORAL | 3 refills | Status: AC

## 2024-12-04 NOTE — Progress Notes (Signed)
 "        Provider:  Dedra Gores, MD  Primary Care Physician:  Rosalynn Camie CROME, MD [address] [place] [address]     Referring Provider: Rosalynn Camie CROME, Md [address] [place],  [place] [address]          Chief Complaint according to patient   Patient presents with:          ESS 0/ 24, FSS 11/ 63      HISTORY OF PRESENT ILLNESS:  Kim Kim is a 69 y.o. female patient who is here for revisit 12/04/2024 for BIPAP compliance.    Chief concern according to patient :   I just flew in to see you today . I slept some nights well on new mask and settings work, but I have insomnia too. . I reviewed her record , limited data available here- she is showing a trend to less air leak.       Kim Kim is a 69 y.o. female patient who is here for revisit 12/06/2023 for follow-up on her CPAP experience.  The patient underwent an attended sleep study here and the encounter date was 08-27-2023.  I have introduced the patient below she has a history of cleft palate CPAP was tolerated but had very high residual apnea up apnea indices and a high air leak.  The titration study was ordered by nurse practitioner Lomax.  The patient is a primary neurology patient of Dr. Margaret for epilepsy.  She was not excessively daytime sleepy or fatigued.  She was titrated to a maximum IPAP of 25 and a minimum EPAP of 17 with 4 cm pressure support to overcome all central apnea this setting had been altered recently during her stay in her home state of [place] , and she was there also introduced to several masks and finally seems to have found someone that services are very best and her apnea hypopnea index is significantly reduced around 4/h now the new mask cut out the air leaks since 28 January this number has been much lower, and we are seeing high compliance data.  Normal minute ventilation normal respiratory rate normal tidal volume.   .  Chief concern according to patient :  Kim Kim  hospital adjusted setitngs and masks.  She now feels more refreshed and energetic , she hasn't had this feling in years.      She was switched to foam rimmed mask FFM, ResMed AirTouch F 20 in Small .    This is what needs to be replaced every 3 months. Head gear for her every 6 months.    Settings work well.      Review of Systems: Out of a complete 14 system review, the patient complains of only the following symptoms, and all other reviewed systems are negative.:   SLEEPINESS ?  How likely are you to doze in the following situations: 0 = not likely, 1 = slight chance, 2 = moderate chance, 3 = high chance  Sitting and Reading? Watching Television? Sitting inactive in a public place (theater or meeting)? Lying down in the afternoon when circumstances permit? Sitting and talking to someone? Sitting quietly after lunch without alcohol? In a car, while stopped for a few minutes in traffic? As a passenger in a car for an hour without a break?  Total =  ESS 0 FSS 11     Social History   Socioeconomic History   Marital status: Married  Spouse name: Gene   Number of children: 3   Years of education: Not on file   Highest education level: Associate degree: occupational, scientist, product/process development, or vocational program  Occupational History    Comment: NA  Tobacco Use   Smoking status: Never    Passive exposure: Never   Smokeless tobacco: Never  Vaping Use   Vaping status: Never Used  Substance and Sexual Activity   Alcohol use: Not Currently   Drug use: Never   Sexual activity: Yes    Partners: Male    Birth control/protection: Post-menopausal  Other Topics Concern   Not on file  Social History Narrative   She recently moved from [place] . 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   Caffeine- coffee maybe 1 c, some tea   Social Drivers of Health   Tobacco Use: Low Risk (12/04/2024)   Patient History    Smoking Tobacco Use: Never    Smokeless Tobacco  Use: Never    Passive Exposure: Never  Financial Resource Strain: Not on file  Food Insecurity: Not on file  Transportation Needs: Not on file  Physical Activity: Not on file  Stress: Not on file  Social Connections: Not on file  Depression (PHQ2-9): Low Risk (03/14/2023)   Depression (PHQ2-9)    PHQ-2 Score: 0  Alcohol Screen: Not on file  Housing: Not on file  Utilities: Not on file  Health Literacy: Not on file    Family History  Problem Relation Age of Onset   Hypertension Mother    Heart disease Mother    Diabetes Mother    Thyroid  disease Mother        hypo   Thyroid  nodules Mother    Hyperlipidemia Father    Heart disease Father    Hypertension Father    Hypertension Sister    Hearing loss Sister    Hypertension Sister    Hearing loss Sister    Diabetes Sister    Hypertension Sister    Hearing loss Sister    Heart disease Brother    Esophageal cancer Neg Hx    Colon cancer Neg Hx    Colon polyps Neg Hx    Stomach cancer Neg Hx    Rectal cancer Neg Hx     Past Medical History:  Diagnosis Date   Allergy    seasonal allergies   Anemia    hx of   Carotid atherosclerosis    Epilepsy (HCC)    Gastric ulcer    hx of   GERD (gastroesophageal reflux disease)    on meds   Growth hormone deficiency    Hiatal hernia    Hypothyroidism    hx of Hashimoto thyroiditis   Pituitary adenoma (HCC)    hx of   Seizures (HCC)    temporal lobe   Thyroid  disease    on meds   Vitamin D deficiency     Past Surgical History:  Procedure Laterality Date   COLONOSCOPY  12/10/2015   [place]  Dr Michelina   ESOPHAGOGASTRODUODENOSCOPY  06/03/2019   Dr Marolyn [place], [place]   WISDOM TOOTH EXTRACTION       Medications Ordered Prior to Encounter[1]  Allergies[2]   DIAGNOSTIC DATA (LABS, IMAGING, TESTING) - I reviewed patient records, labs, notes, testing and imaging myself where available.  Lab Results  Component Value Date   WBC 7.3 11/14/2022   HGB 11.8 (L)  11/14/2022   HCT 36.5 11/14/2022   MCV 83.9  11/14/2022   PLT 195 11/14/2022      Component Value Date/Time   NA 138 11/14/2022 1207   NA 139 07/14/2021 0000   K 4.6 11/14/2022 1207   CL 104 11/14/2022 1207   CO2 26 11/14/2022 1207   GLUCOSE 96 11/14/2022 1207   BUN 18 11/14/2022 1207   BUN 17 07/14/2021 0000   CREATININE 0.98 11/14/2022 1207   CALCIUM 9.6 11/14/2022 1207   PROT 7.8 11/16/2022 1636   PROT 7.9 07/12/2021 0948   ALBUMIN 4.4 11/16/2022 1636   ALBUMIN 5.1 (H) 07/12/2021 0948   AST 18 11/16/2022 1636   ALT 11 11/16/2022 1636   ALKPHOS 95 11/16/2022 1636   BILITOT 0.3 11/16/2022 1636   BILITOT 0.4 07/12/2021 0948   GFRNONAA >60 11/14/2022 1207   Lab Results  Component Value Date   CHOL 118 11/16/2022   HDL 50.10 11/16/2022   LDLCALC 46 11/16/2022   TRIG 113.0 11/16/2022   CHOLHDL 2 11/16/2022   Lab Results  Component Value Date   HGBA1C 6.0 11/16/2022   Lab Results  Component Value Date   VITAMINB12 >1500 (H) 11/16/2022   Lab Results  Component Value Date   TSH 2.32 11/28/2021    PHYSICAL EXAM:  Vitals:   12/04/24 1039  BP: 117/79  Pulse: 87   No data found. Body mass index is 30.55 kg/m.   Wt Readings from Last 3 Encounters:  12/04/24 178 lb (80.7 kg)  08/25/24 176 lb (79.8 kg)  08/15/24 170 lb (77.1 kg)     Ht Readings from Last 3 Encounters:  12/04/24 5' 4 (1.626 m)  08/25/24 5' 4 (1.626 m)  08/15/24 5' 4 (1.626 m)      General: The patient is awake, alert and appears not in acute distress and groomed. Head: Normocephalic, atraumatic.  Neck is supple. Mallampati 3,  neck circumference:13.5  inches . Nasal airflow not fully patent.  Retrognathia is not seen. Small oral opening.  Dental status: regular but small.  Cardiovascular:  Regular rate and cardiac rhythm by pulse,  without distended neck veins. Respiratory: Lungs are clear to auscultation.  Skin:  Without evidence of ankle edema, or rash. Trunk: The patient's  posture is erect.   Neurologic exam : The patient is awake and alert, oriented to place and time.   Memory subjective described as intact.  Attention span & concentration ability appears normal.  Speech is fluent,  with 'nasal speech  dysphonia.  Mood and affect are appropriate.   Cranial nerves: no loss of smell or taste reported  Pupils are equal and briskly reactive to light. Funduscopic exam deferred. Extraocular movements in vertical and horizontal planes were intact and without nystagmus. No Diplopia. Visual fields by finger perimetry are intact. Hearing was intact to soft voice and finger rubbing.   Facial sensation intact to fine touch.  Facial motor strength is symmetric and tongue and uvula move midline.  Neck ROM : rotation, tilt and flexion extension were normal for age and shoulder shrug was symmetrical.   ASSESSMENT AND PLAN :   She has finally found the best mask and best settings for her- with the help of [facility] services.  The patient's primary care physician is Dr. Joshua at [facility],  her primary neurologist  ( she is on Lamictal  ) is Dr. Margaret, MD and she has gotten refills from Greig Forbes, NP.     I plan to follow up either personally or through our NP within 12 months.  68 y.o. year old female  here with:  OSA on BiPAP, in a patient with cleft palate.     1) improving air seal and compliance on a  ResMed  BiPAP, high air leak is sometimes irritating her but was the last 4 nights well controlled after new mask was used.  This time she returned to an Airtouch F 30 I in small and wide. With textile shield and a new vent in front of the mask, tubing on top.   She takes Lamictal  brand name only , a refill was requested.  She has a history of a pituitary Microdenoma she reports, we don't have any images here , the work up was done at [facility].    I would like to thank  Eduard Hanlon, MD and Rosalynn Camie CROME, MD  for allowing me to meet with this  pleasant patient.   Sleep Clinic Patients are generally offered input on sleep hygiene, life style changes and how to improve compliance with medical treatment where applicable. Review and reiteration of good sleep hygiene measures is offered to any sleep clinic patient, be it in the first consultation or with any follow up visits.  Any patient with sleepiness should be cautioned not to drive, work at heights, or operate dangerous or heavy equipment when feeling tired or sleepy.   The patient will be seen in follow-up in the sleep clinic at [facility] for discussion of test results, sleep related symptoms and treatment compliance review, further management strategies, etc.   The referring provider will be notified of the test results.   The patient's condition requires frequent monitoring and adjustments in the treatment plan, reflecting the ongoing complexity of care.  This provider is the continuing focal point for all needed services for this condition.  After spending a total time of  29  minutes face to face and time for  history taking, physical and neurologic examination, review of laboratory studies,  personal review of imaging studies, reports and results of other testing and review of referral information / records as far as provided in visit,   Electronically signed by: Dedra Gores, MD 12/04/2024 10:43 AM  [facility] Board certified by The [employer] of Sleep Medicine and Diplomate of the [employer] of Sleep Medicine. Board certified In Neurology through the ABPN, Fellow of the [employer] of Neurology.      [1]  Current Outpatient Medications on File Prior to Visit  Medication Sig Dispense Refill   desonide  (DESOWEN ) 0.05 % cream Apply 1 Application topically as needed. 60 g 3   LAMICTAL  100 MG tablet Take 1 tablet (100 mg total) by mouth 2 (two) times daily. 180 tablet 3   levothyroxine (SYNTHROID ) 100 MCG tablet Take 100 mcg  by mouth daily before breakfast. (Patient taking differently: Take 50 mcg by mouth once a week. 50mg  daily, 100mg  on Sunday)     omeprazole  (PRILOSEC ) 10 MG capsule Take 10 mg by mouth daily.     rosuvastatin (CRESTOR) 10 MG tablet Take 10 mg by mouth at bedtime.     SYNTHROID  50 MCG tablet Take 50 mcg 6 days weekly.     No current facility-administered medications on file prior to visit.  [2] No Known Allergies  "

## 2024-12-04 NOTE — Patient Instructions (Signed)
 Expand All Collapse All              Provider:  Dedra Gores, MD  Primary Care Physician:  Rosalynn Camie CROME, MD [address] [place] [address]      Referring Provider: Rosalynn Camie CROME, Md [address] [place],  [place] [address]             Chief Complaint according to patient   Patient presents with:              ESS 0/ 24, FSS 11/ 63      HISTORY OF PRESENT ILLNESS:  Kim Kim is a 69 y.o. female patient who is here for revisit 12/04/2024 for BIPAP compliance.     Chief concern according to patient :   I just flew in to see you today . I slept some nights well on new mask and settings work, but I have insomnia too. . I reviewed her record , limited data available here- she is showing a trend to less air leak.          Kim Kim is a 69 y.o. female patient who is here for revisit 12/06/2023 for follow-up on her CPAP experience.  The patient underwent an attended sleep study here and the encounter date was 08-27-2023.  I have introduced the patient below she has a history of cleft palate CPAP was tolerated but had very high residual apnea up apnea indices and a high air leak.  The titration study was ordered by nurse practitioner Lomax.  The patient is a primary neurology patient of Dr. Margaret for epilepsy.  She was not excessively daytime sleepy or fatigued.  She was titrated to a maximum IPAP of 25 and a minimum EPAP of 17 with 4 cm pressure support to overcome all central apnea this setting had been altered recently during her stay in her home state of [place] , and she was there also introduced to several masks and finally seems to have found someone that services are very best and her apnea hypopnea index is significantly reduced around 4/h now the new mask cut out the air leaks since 28 January this number has been much lower, and we are seeing high compliance data.  Normal minute ventilation normal respiratory rate normal tidal volume.   .  Chief  concern according to patient :  [name] adjusted setitngs and masks.  She now feels more refreshed and energetic , she hasn't had this feling in years.      She was switched to foam rimmed mask FFM, ResMed AirTouch F 20 in Small .    This is what needs to be replaced every 3 months. Head gear for her every 6 months.    Settings work well.        Review of Systems: Out of a complete 14 system review, the patient complains of only the following symptoms, and all other reviewed systems are negative.:    SLEEPINESS ?   How likely are you to doze in the following situations: 0 = not likely, 1 = slight chance, 2 = moderate chance, 3 = high chance   Sitting and Reading? Watching Television? Sitting inactive in a public place (theater or meeting)? Lying down in the afternoon when circumstances permit? Sitting and talking to someone? Sitting quietly after lunch without alcohol? In a car, while stopped for a few minutes in traffic? As a passenger in a car for an hour without a break?  Total =  ESS 0 FSS 11         Social History         Socioeconomic History   Marital status: Married      Spouse name: Gene   Number of children: 3   Years of education: Not on file   Highest education level: Associate degree: occupational, scientist, product/process development, or vocational program  Occupational History      Comment: NA  Tobacco Use   Smoking status: Never      Passive exposure: Never   Smokeless tobacco: Never  Vaping Use   Vaping status: Never Used  Substance and Sexual Activity   Alcohol use: Not Currently   Drug use: Never   Sexual activity: Yes      Partners: Male      Birth control/protection: Post-menopausal  Other Topics Concern   Not on file  Social History Narrative    She recently moved from [place] . 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    Caffeine- coffee maybe 1 c, some tea    Social Drivers of Health        Tobacco Use: Low Risk  (12/04/2024)    Patient History     Smoking Tobacco Use: Never     Smokeless Tobacco Use: Never     Passive Exposure: Never  Financial Resource Strain: Not on file  Food Insecurity: Not on file  Transportation Needs: Not on file  Physical Activity: Not on file  Stress: Not on file  Social Connections: Not on file  Depression (PHQ2-9): Low Risk (03/14/2023)    Depression (PHQ2-9)     PHQ-2 Score: 0  Alcohol Screen: Not on file  Housing: Not on file  Utilities: Not on file  Health Literacy: Not on file           Family History  Problem Relation Age of Onset   Hypertension Mother     Heart disease Mother     Diabetes Mother     Thyroid  disease Mother          hypo   Thyroid  nodules Mother     Hyperlipidemia Father     Heart disease Father     Hypertension Father     Hypertension Sister     Hearing loss Sister     Hypertension Sister     Hearing loss Sister     Diabetes Sister     Hypertension Sister     Hearing loss Sister     Heart disease Brother     Esophageal cancer Neg Hx     Colon cancer Neg Hx     Colon polyps Neg Hx     Stomach cancer Neg Hx     Rectal cancer Neg Hx                Past Medical History:  Diagnosis Date   Allergy      seasonal allergies   Anemia      hx of   Carotid atherosclerosis     Epilepsy (HCC)     Gastric ulcer      hx of   GERD (gastroesophageal reflux disease)      on meds   Growth hormone deficiency     Hiatal hernia     Hypothyroidism      hx of Hashimoto thyroiditis   Pituitary adenoma (HCC)      hx of   Seizures (HCC)  temporal lobe   Thyroid  disease      on meds   Vitamin D deficiency                 Past Surgical History:  Procedure Laterality Date   COLONOSCOPY   12/10/2015    [place]  Dr Michelina   ESOPHAGOGASTRODUODENOSCOPY   06/03/2019    Dr Marolyn [place], [place]   WISDOM TOOTH EXTRACTION              [Medications Ordered Prior to [employer] Ordered Prior to [employer]        Current Outpatient Medications on File Prior to Visit  Medication Sig Dispense Refill   desonide  (DESOWEN ) 0.05 % cream Apply 1 Application topically as needed. 60 g 3   LAMICTAL  100 MG tablet Take 1 tablet (100 mg total) by mouth 2 (two) times daily. 180 tablet 3   levothyroxine (SYNTHROID ) 100 MCG tablet Take 100 mcg by mouth daily before breakfast. (Patient taking differently: Take 50 mcg by mouth once a week. 50mg  daily, 100mg  on Sunday)       omeprazole  (PRILOSEC ) 10 MG capsule Take 10 mg by mouth daily.       rosuvastatin (CRESTOR) 10 MG tablet Take 10 mg by mouth at bedtime.       SYNTHROID  50 MCG tablet Take 50 mcg 6 days weekly.        No current facility-administered medications on file prior to visit.     [Allergies]  [Allergies] No Known Allergies    DIAGNOSTIC DATA (LABS, IMAGING, TESTING) - I reviewed patient records, labs, notes, testing and imaging myself where available.   Recent Labs       Lab Results  Component Value Date    WBC 7.3 11/14/2022    HGB 11.8 (L) 11/14/2022    HCT 36.5 11/14/2022    MCV 83.9 11/14/2022    PLT 195 11/14/2022      Labs (Brief)          Component Value Date/Time    NA 138 11/14/2022 1207    NA 139 07/14/2021 0000    K 4.6 11/14/2022 1207    CL 104 11/14/2022 1207    CO2 26 11/14/2022 1207    GLUCOSE 96 11/14/2022 1207    BUN 18 11/14/2022 1207    BUN 17 07/14/2021 0000    CREATININE 0.98 11/14/2022 1207    CALCIUM 9.6 11/14/2022 1207    PROT 7.8 11/16/2022 1636    PROT 7.9 07/12/2021 0948    ALBUMIN 4.4 11/16/2022 1636    ALBUMIN 5.1 (H) 07/12/2021 0948    AST 18 11/16/2022 1636    ALT 11 11/16/2022 1636    ALKPHOS 95 11/16/2022 1636    BILITOT 0.3 11/16/2022 1636    BILITOT 0.4 07/12/2021 0948    GFRNONAA >60 11/14/2022 1207      Recent Labs       Lab Results  Component Value Date    CHOL 118 11/16/2022    HDL 50.10 11/16/2022    LDLCALC 46 11/16/2022    TRIG 113.0 11/16/2022    CHOLHDL 2 11/16/2022       Recent Labs       Lab Results  Component Value Date    HGBA1C 6.0 11/16/2022      Recent Labs       Lab Results  Component Value Date    VITAMINB12 >1500 (H) 11/16/2022      Recent Labs  Lab Results  Component Value Date    TSH 2.32 11/28/2021        PHYSICAL EXAM:      Vitals:    12/04/24 1039  BP: 117/79  Pulse: 87    No data found. Body mass index is 30.55 kg/m.       Wt Readings from Last 3 Encounters:  12/04/24 178 lb (80.7 kg)  08/25/24 176 lb (79.8 kg)  08/15/24 170 lb (77.1 kg)         Ht Readings from Last 3 Encounters:  12/04/24 5' 4 (1.626 m)  08/25/24 5' 4 (1.626 m)  08/15/24 5' 4 (1.626 m)      General: The patient is awake, alert and appears not in acute distress and groomed. Head: Normocephalic, atraumatic.  Neck is supple. Mallampati 3,  neck circumference:13.5  inches . Nasal airflow not fully patent.  Retrognathia is not seen. Small oral opening.  Dental status: regular but small.  Cardiovascular:  Regular rate and cardiac rhythm by pulse,  without distended neck veins. Respiratory: Lungs are clear to auscultation.  Skin:  Without evidence of ankle edema, or rash. Trunk: The patient's posture is erect.   Neurologic exam : The patient is awake and alert, oriented to place and time.   Memory subjective described as intact.  Attention span & concentration ability appears normal.  Speech is fluent,  with 'nasal speech  dysphonia.  Mood and affect are appropriate.   Cranial nerves: no loss of smell or taste reported  Pupils are equal and briskly reactive to light. Funduscopic exam deferred. Extraocular movements in vertical and horizontal planes were intact and without nystagmus. No Diplopia. Visual fields by finger perimetry are intact. Hearing was intact to soft voice and finger rubbing.   Facial sensation intact to fine touch.  Facial motor strength is symmetric and tongue and uvula move midline.  Neck ROM :  rotation, tilt and flexion extension were normal for age and shoulder shrug was symmetrical.    ASSESSMENT AND PLAN :    She has finally found the best mask and best settings for her- with the help of [facility] services.  The patient's primary care physician is Dr. Joshua at [facility],  her primary neurologist  ( she is on Lamictal  ) is Dr. Margaret, MD and she has gotten refills from Greig Forbes, NP.     I plan to follow up either personally or through our NP within 12 months.    70 y.o. year old female  here with:   OSA on BiPAP, in a patient with cleft palate.      1) improving air seal and compliance on a  ResMed  BiPAP, high air leak is sometimes irritating her but was the last 4 nights well controlled after new mask was used.   This time she returned to an Airtouch F 30 I in small and wide. With textile shield and a new vent in front of the mask, tubing on top.     She takes Lamictal  brand name only , a refill was requested.  She has a history of a pituitary Microdenoma she reports, we don't have any images here , the work up was done at [facility].       I would like to thank  Eduard Margaret, MD and Rosalynn Camie CROME, MD  for allowing me to meet with this pleasant patient.

## 2024-12-04 NOTE — Progress Notes (Signed)
 SABRA

## 2024-12-04 NOTE — Addendum Note (Signed)
 Addended by: CHALICE SAUNAS on: 12/04/2024 11:21 AM   Modules accepted: Orders

## 2024-12-05 LAB — COMPREHENSIVE METABOLIC PANEL WITH GFR
ALT: 14 [IU]/L (ref 0–32)
AST: 23 [IU]/L (ref 0–40)
Albumin: 4.6 g/dL (ref 3.9–4.9)
Alkaline Phosphatase: 134 [IU]/L (ref 49–135)
BUN/Creatinine Ratio: 21 (ref 12–28)
BUN: 17 mg/dL (ref 8–27)
Bilirubin Total: 0.4 mg/dL (ref 0.0–1.2)
CO2: 23 mmol/L (ref 20–29)
Calcium: 9.4 mg/dL (ref 8.7–10.3)
Chloride: 101 mmol/L (ref 96–106)
Creatinine, Ser: 0.82 mg/dL (ref 0.57–1.00)
Globulin, Total: 3 g/dL (ref 1.5–4.5)
Glucose: 83 mg/dL (ref 70–99)
Potassium: 4.3 mmol/L (ref 3.5–5.2)
Sodium: 139 mmol/L (ref 134–144)
Total Protein: 7.6 g/dL (ref 6.0–8.5)
eGFR: 79 mL/min/{1.73_m2}

## 2024-12-05 LAB — LAMOTRIGINE LEVEL: Lamotrigine Lvl: 8.5 ug/mL (ref 2.0–20.0)

## 2024-12-08 ENCOUNTER — Telehealth: Admitting: Diagnostic Neuroimaging

## 2024-12-11 ENCOUNTER — Other Ambulatory Visit: Admitting: *Deleted

## 2025-12-07 ENCOUNTER — Ambulatory Visit: Admitting: Neurology

## 2026-07-21 ENCOUNTER — Ambulatory Visit: Admitting: Rheumatology
